# Patient Record
Sex: Male | Born: 1935 | Race: White | Hispanic: No | Marital: Married | Smoking: Never smoker
Health system: Southern US, Community
[De-identification: ages and names within clinical notes are randomized; demographics above are authoritative.]

## PROBLEM LIST (undated history)

## (undated) DIAGNOSIS — E119 Type 2 diabetes mellitus without complications: Secondary | ICD-10-CM

## (undated) DIAGNOSIS — I82409 Acute embolism and thrombosis of unspecified deep veins of unspecified lower extremity: Principal | ICD-10-CM

## (undated) DIAGNOSIS — E538 Deficiency of other specified B group vitamins: Secondary | ICD-10-CM

## (undated) DIAGNOSIS — C189 Malignant neoplasm of colon, unspecified: Secondary | ICD-10-CM

## (undated) DIAGNOSIS — E039 Hypothyroidism, unspecified: Secondary | ICD-10-CM

## (undated) DIAGNOSIS — C320 Malignant neoplasm of glottis: Secondary | ICD-10-CM

## (undated) DIAGNOSIS — G4733 Obstructive sleep apnea (adult) (pediatric): Secondary | ICD-10-CM

## (undated) DIAGNOSIS — N400 Enlarged prostate without lower urinary tract symptoms: Secondary | ICD-10-CM

## (undated) DIAGNOSIS — E041 Nontoxic single thyroid nodule: Secondary | ICD-10-CM

## (undated) DIAGNOSIS — M546 Pain in thoracic spine: Secondary | ICD-10-CM

## (undated) DIAGNOSIS — R609 Edema, unspecified: Principal | ICD-10-CM

## (undated) DIAGNOSIS — F068 Other specified mental disorders due to known physiological condition: Secondary | ICD-10-CM

## (undated) DIAGNOSIS — A0472 Enterocolitis due to Clostridium difficile, not specified as recurrent: Secondary | ICD-10-CM

## (undated) DIAGNOSIS — K219 Gastro-esophageal reflux disease without esophagitis: Secondary | ICD-10-CM

## (undated) DIAGNOSIS — I5030 Unspecified diastolic (congestive) heart failure: Secondary | ICD-10-CM

## (undated) DIAGNOSIS — N259 Disorder resulting from impaired renal tubular function, unspecified: Secondary | ICD-10-CM

## (undated) DIAGNOSIS — I498 Other specified cardiac arrhythmias: Secondary | ICD-10-CM

## (undated) DIAGNOSIS — K5909 Other constipation: Secondary | ICD-10-CM

## (undated) DIAGNOSIS — M129 Arthropathy, unspecified: Secondary | ICD-10-CM

## (undated) DIAGNOSIS — R972 Elevated prostate specific antigen [PSA]: Secondary | ICD-10-CM

## (undated) DIAGNOSIS — M810 Age-related osteoporosis without current pathological fracture: Secondary | ICD-10-CM

## (undated) DIAGNOSIS — Z86010 Personal history of colonic polyps: Secondary | ICD-10-CM

## (undated) DIAGNOSIS — E785 Hyperlipidemia, unspecified: Secondary | ICD-10-CM

## (undated) DIAGNOSIS — I10 Essential (primary) hypertension: Secondary | ICD-10-CM

## (undated) DIAGNOSIS — Z85038 Personal history of other malignant neoplasm of large intestine: Secondary | ICD-10-CM

## (undated) DIAGNOSIS — F329 Major depressive disorder, single episode, unspecified: Secondary | ICD-10-CM

## (undated) DIAGNOSIS — Z87442 Personal history of urinary calculi: Secondary | ICD-10-CM

## (undated) DIAGNOSIS — M75100 Unspecified rotator cuff tear or rupture of unspecified shoulder, not specified as traumatic: Secondary | ICD-10-CM

## (undated) DIAGNOSIS — D649 Anemia, unspecified: Secondary | ICD-10-CM

## (undated) HISTORY — DX: Benign prostatic hyperplasia without lower urinary tract symptoms: N40.0

## (undated) HISTORY — DX: Age-related osteoporosis without current pathological fracture: M81.0

## (undated) HISTORY — DX: Edema, unspecified: R60.9

## (undated) HISTORY — DX: Anemia, unspecified: D64.9

## (undated) HISTORY — DX: Other constipation: K59.09

## (undated) HISTORY — DX: Elevated prostate specific antigen (PSA): R97.20

## (undated) HISTORY — DX: Malignant neoplasm of glottis: C32.0

## (undated) HISTORY — DX: Deficiency of other specified B group vitamins: E53.8

## (undated) HISTORY — DX: Disorder resulting from impaired renal tubular function, unspecified: N25.9

## (undated) HISTORY — DX: Hypothyroidism, unspecified: E03.9

## (undated) HISTORY — DX: Personal history of colonic polyps: Z86.010

## (undated) HISTORY — DX: Personal history of urinary calculi: Z87.442

## (undated) HISTORY — PX: CATARACT EXTRACTION, BILATERAL: SHX1313

## (undated) HISTORY — DX: Arthropathy, unspecified: M12.9

## (undated) HISTORY — DX: Hyperlipidemia, unspecified: E78.5

## (undated) HISTORY — DX: Major depressive disorder, single episode, unspecified: F32.9

## (undated) HISTORY — DX: Gastro-esophageal reflux disease without esophagitis: K21.9

## (undated) HISTORY — DX: Pain in thoracic spine: M54.6

## (undated) HISTORY — DX: Essential (primary) hypertension: I10

## (undated) HISTORY — DX: Other specified mental disorders due to known physiological condition: F06.8

## (undated) HISTORY — DX: Unspecified rotator cuff tear or rupture of unspecified shoulder, not specified as traumatic: M75.100

## (undated) HISTORY — DX: Acute embolism and thrombosis of unspecified deep veins of unspecified lower extremity: I82.409

## (undated) HISTORY — DX: Type 2 diabetes mellitus without complications: E11.9

## (undated) HISTORY — DX: Other specified cardiac arrhythmias: I49.8

## (undated) HISTORY — DX: Nontoxic single thyroid nodule: E04.1

## (undated) HISTORY — DX: Obstructive sleep apnea (adult) (pediatric): G47.33

## (undated) HISTORY — DX: Personal history of other malignant neoplasm of large intestine: Z85.038

## (undated) HISTORY — DX: Malignant neoplasm of colon, unspecified: C18.9

## (undated) HISTORY — DX: Enterocolitis due to Clostridium difficile, not specified as recurrent: A04.72

## (undated) HISTORY — PX: OTHER SURGICAL HISTORY: SHX169

---

## 1982-03-06 HISTORY — PX: OTHER SURGICAL HISTORY: SHX169

## 1996-03-06 HISTORY — PX: COLON RESECTION: SHX5231

## 1996-03-06 HISTORY — PX: APPENDECTOMY: SHX54

## 1997-03-06 HISTORY — PX: CHOLECYSTECTOMY: SHX55

## 2004-09-07 ENCOUNTER — Ambulatory Visit: Payer: Self-pay | Admitting: Internal Medicine

## 2004-10-18 ENCOUNTER — Ambulatory Visit: Payer: Self-pay | Admitting: Internal Medicine

## 2004-11-29 ENCOUNTER — Ambulatory Visit: Payer: Self-pay | Admitting: Internal Medicine

## 2004-12-06 ENCOUNTER — Ambulatory Visit: Payer: Self-pay | Admitting: Internal Medicine

## 2004-12-15 ENCOUNTER — Ambulatory Visit: Payer: Self-pay | Admitting: Internal Medicine

## 2004-12-15 ENCOUNTER — Ambulatory Visit (HOSPITAL_COMMUNITY): Admission: RE | Admit: 2004-12-15 | Discharge: 2004-12-15 | Payer: Self-pay | Admitting: Internal Medicine

## 2005-02-07 ENCOUNTER — Emergency Department (HOSPITAL_COMMUNITY): Admission: EM | Admit: 2005-02-07 | Discharge: 2005-02-07 | Payer: Self-pay | Admitting: Emergency Medicine

## 2005-02-20 ENCOUNTER — Ambulatory Visit: Payer: Self-pay | Admitting: Internal Medicine

## 2005-03-27 ENCOUNTER — Ambulatory Visit: Payer: Self-pay | Admitting: Internal Medicine

## 2005-05-31 ENCOUNTER — Ambulatory Visit: Payer: Self-pay | Admitting: Internal Medicine

## 2005-06-01 ENCOUNTER — Ambulatory Visit (HOSPITAL_COMMUNITY): Admission: RE | Admit: 2005-06-01 | Discharge: 2005-06-01 | Payer: Self-pay | Admitting: Internal Medicine

## 2005-06-19 ENCOUNTER — Ambulatory Visit: Payer: Self-pay | Admitting: Endocrinology

## 2005-06-26 ENCOUNTER — Ambulatory Visit: Payer: Self-pay | Admitting: Internal Medicine

## 2005-08-02 ENCOUNTER — Ambulatory Visit: Payer: Self-pay | Admitting: Internal Medicine

## 2005-08-21 ENCOUNTER — Encounter: Payer: Self-pay | Admitting: Cardiology

## 2005-08-21 ENCOUNTER — Ambulatory Visit: Payer: Self-pay

## 2005-08-30 ENCOUNTER — Ambulatory Visit: Payer: Self-pay | Admitting: Internal Medicine

## 2005-09-12 ENCOUNTER — Ambulatory Visit: Payer: Self-pay

## 2005-09-18 ENCOUNTER — Ambulatory Visit: Payer: Self-pay | Admitting: Internal Medicine

## 2005-09-22 ENCOUNTER — Ambulatory Visit: Payer: Self-pay

## 2005-09-22 ENCOUNTER — Ambulatory Visit: Payer: Self-pay | Admitting: Internal Medicine

## 2005-10-23 ENCOUNTER — Ambulatory Visit: Payer: Self-pay | Admitting: Internal Medicine

## 2005-10-24 ENCOUNTER — Ambulatory Visit: Payer: Self-pay | Admitting: Internal Medicine

## 2005-11-02 ENCOUNTER — Ambulatory Visit: Payer: Self-pay | Admitting: Internal Medicine

## 2005-12-05 ENCOUNTER — Ambulatory Visit: Payer: Self-pay | Admitting: Internal Medicine

## 2005-12-22 ENCOUNTER — Ambulatory Visit: Payer: Self-pay | Admitting: Internal Medicine

## 2006-01-11 ENCOUNTER — Ambulatory Visit (HOSPITAL_BASED_OUTPATIENT_CLINIC_OR_DEPARTMENT_OTHER): Admission: RE | Admit: 2006-01-11 | Discharge: 2006-01-11 | Payer: Self-pay | Admitting: Internal Medicine

## 2006-01-11 ENCOUNTER — Encounter: Payer: Self-pay | Admitting: Pulmonary Disease

## 2006-01-27 ENCOUNTER — Ambulatory Visit: Payer: Self-pay | Admitting: Pulmonary Disease

## 2006-03-19 ENCOUNTER — Ambulatory Visit: Payer: Self-pay | Admitting: Pulmonary Disease

## 2006-03-21 ENCOUNTER — Ambulatory Visit: Payer: Self-pay | Admitting: Internal Medicine

## 2006-03-21 ENCOUNTER — Ambulatory Visit (HOSPITAL_COMMUNITY): Admission: RE | Admit: 2006-03-21 | Discharge: 2006-03-21 | Payer: Self-pay | Admitting: Internal Medicine

## 2006-03-21 LAB — CONVERTED CEMR LAB
CO2: 26 meq/L (ref 19–32)
Calcium: 9.4 mg/dL (ref 8.4–10.5)
Chloride: 107 meq/L (ref 96–112)
Cholesterol: 139 mg/dL (ref 0–200)
GFR calc non Af Amer: 48 mL/min
Glucose, Bld: 140 mg/dL — ABNORMAL HIGH (ref 70–99)
PSA: 5.48 ng/mL
PSA: 5.48 ng/mL — ABNORMAL HIGH (ref 0.10–4.00)
TSH: 2.9 microintl units/mL (ref 0.35–5.50)

## 2006-04-06 ENCOUNTER — Ambulatory Visit: Payer: Self-pay | Admitting: Gastroenterology

## 2006-04-23 ENCOUNTER — Ambulatory Visit: Payer: Self-pay | Admitting: Pulmonary Disease

## 2006-04-26 ENCOUNTER — Encounter (INDEPENDENT_AMBULATORY_CARE_PROVIDER_SITE_OTHER): Payer: Self-pay | Admitting: *Deleted

## 2006-04-26 ENCOUNTER — Ambulatory Visit: Payer: Self-pay | Admitting: Gastroenterology

## 2006-05-03 ENCOUNTER — Ambulatory Visit: Payer: Self-pay | Admitting: Internal Medicine

## 2006-07-20 ENCOUNTER — Ambulatory Visit: Payer: Self-pay | Admitting: Pulmonary Disease

## 2006-09-20 ENCOUNTER — Encounter: Payer: Self-pay | Admitting: Internal Medicine

## 2006-09-20 DIAGNOSIS — E785 Hyperlipidemia, unspecified: Secondary | ICD-10-CM

## 2006-09-20 DIAGNOSIS — Z85038 Personal history of other malignant neoplasm of large intestine: Secondary | ICD-10-CM

## 2006-09-20 DIAGNOSIS — C320 Malignant neoplasm of glottis: Secondary | ICD-10-CM

## 2006-09-20 DIAGNOSIS — E119 Type 2 diabetes mellitus without complications: Secondary | ICD-10-CM

## 2006-09-20 DIAGNOSIS — I10 Essential (primary) hypertension: Secondary | ICD-10-CM

## 2006-09-20 HISTORY — DX: Malignant neoplasm of glottis: C32.0

## 2006-09-20 HISTORY — DX: Hyperlipidemia, unspecified: E78.5

## 2006-09-20 HISTORY — DX: Essential (primary) hypertension: I10

## 2006-09-20 HISTORY — DX: Type 2 diabetes mellitus without complications: E11.9

## 2006-09-20 HISTORY — DX: Personal history of other malignant neoplasm of large intestine: Z85.038

## 2006-09-28 ENCOUNTER — Ambulatory Visit: Payer: Self-pay | Admitting: Pulmonary Disease

## 2006-10-08 DIAGNOSIS — Z860100 Personal history of colon polyps, unspecified: Secondary | ICD-10-CM | POA: Insufficient documentation

## 2006-10-08 DIAGNOSIS — M810 Age-related osteoporosis without current pathological fracture: Secondary | ICD-10-CM | POA: Insufficient documentation

## 2006-10-08 DIAGNOSIS — N400 Enlarged prostate without lower urinary tract symptoms: Secondary | ICD-10-CM

## 2006-10-08 HISTORY — DX: Benign prostatic hyperplasia without lower urinary tract symptoms: N40.0

## 2006-10-08 HISTORY — DX: Personal history of colonic polyps: Z86.010

## 2006-10-08 HISTORY — DX: Age-related osteoporosis without current pathological fracture: M81.0

## 2006-10-08 HISTORY — DX: Personal history of colon polyps, unspecified: Z86.0100

## 2006-11-01 ENCOUNTER — Ambulatory Visit: Payer: Self-pay | Admitting: Internal Medicine

## 2006-11-01 LAB — CONVERTED CEMR LAB
BUN: 23 mg/dL (ref 6–23)
CO2: 29 meq/L (ref 19–32)
Chloride: 108 meq/L (ref 96–112)
Creatinine, Ser: 1.2 mg/dL (ref 0.4–1.5)
HDL: 37.5 mg/dL — ABNORMAL LOW (ref >39.0)
Potassium: 4.5 meq/L (ref 3.5–5.1)
TSH: 1.34 microintl units/mL (ref 0.35–5.50)
Triglycerides: 145 mg/dL (ref 0–149)

## 2006-12-05 ENCOUNTER — Ambulatory Visit: Payer: Self-pay | Admitting: Internal Medicine

## 2007-01-14 ENCOUNTER — Ambulatory Visit: Payer: Self-pay | Admitting: Internal Medicine

## 2007-01-14 DIAGNOSIS — K219 Gastro-esophageal reflux disease without esophagitis: Secondary | ICD-10-CM

## 2007-01-14 DIAGNOSIS — Z87442 Personal history of urinary calculi: Secondary | ICD-10-CM

## 2007-01-14 DIAGNOSIS — G4733 Obstructive sleep apnea (adult) (pediatric): Secondary | ICD-10-CM

## 2007-01-14 DIAGNOSIS — M719 Bursopathy, unspecified: Secondary | ICD-10-CM

## 2007-01-14 DIAGNOSIS — E039 Hypothyroidism, unspecified: Secondary | ICD-10-CM

## 2007-01-14 DIAGNOSIS — R972 Elevated prostate specific antigen [PSA]: Secondary | ICD-10-CM

## 2007-01-14 DIAGNOSIS — M67919 Unspecified disorder of synovium and tendon, unspecified shoulder: Secondary | ICD-10-CM | POA: Insufficient documentation

## 2007-01-14 DIAGNOSIS — E041 Nontoxic single thyroid nodule: Secondary | ICD-10-CM

## 2007-01-14 HISTORY — DX: Hypothyroidism, unspecified: E03.9

## 2007-01-14 HISTORY — DX: Obstructive sleep apnea (adult) (pediatric): G47.33

## 2007-01-14 HISTORY — DX: Personal history of urinary calculi: Z87.442

## 2007-01-14 HISTORY — DX: Nontoxic single thyroid nodule: E04.1

## 2007-01-14 HISTORY — DX: Gastro-esophageal reflux disease without esophagitis: K21.9

## 2007-01-14 LAB — CONVERTED CEMR LAB
BUN: 24 mg/dL — ABNORMAL HIGH (ref 6–23)
CO2: 29 meq/L (ref 19–32)
Creatinine, Ser: 1.3 mg/dL (ref 0.4–1.5)
GFR calc Af Amer: 68 mL/min
Glucose, Bld: 137 mg/dL — ABNORMAL HIGH (ref 70–99)
HDL: 38 mg/dL — ABNORMAL LOW (ref >39.0)
PSA: 4.88 ng/mL — ABNORMAL HIGH (ref 0.10–4.00)
Potassium: 4.1 meq/L (ref 3.5–5.1)
Sodium: 141 meq/L (ref 135–145)
Total CHOL/HDL Ratio: 4.2
Triglycerides: 190 mg/dL — ABNORMAL HIGH (ref 0–149)

## 2007-01-16 ENCOUNTER — Telehealth (INDEPENDENT_AMBULATORY_CARE_PROVIDER_SITE_OTHER): Payer: Self-pay | Admitting: *Deleted

## 2007-01-22 ENCOUNTER — Telehealth (INDEPENDENT_AMBULATORY_CARE_PROVIDER_SITE_OTHER): Payer: Self-pay | Admitting: *Deleted

## 2007-01-23 ENCOUNTER — Ambulatory Visit: Payer: Self-pay | Admitting: Internal Medicine

## 2007-01-23 ENCOUNTER — Inpatient Hospital Stay (HOSPITAL_COMMUNITY): Admission: EM | Admit: 2007-01-23 | Discharge: 2007-01-27 | Payer: Self-pay | Admitting: Internal Medicine

## 2007-01-29 ENCOUNTER — Ambulatory Visit: Payer: Self-pay | Admitting: Gastroenterology

## 2007-01-30 ENCOUNTER — Ambulatory Visit: Payer: Self-pay | Admitting: Internal Medicine

## 2007-01-30 DIAGNOSIS — A0472 Enterocolitis due to Clostridium difficile, not specified as recurrent: Secondary | ICD-10-CM

## 2007-01-30 DIAGNOSIS — R42 Dizziness and giddiness: Secondary | ICD-10-CM

## 2007-01-30 HISTORY — DX: Enterocolitis due to Clostridium difficile, not specified as recurrent: A04.72

## 2007-02-19 ENCOUNTER — Emergency Department (HOSPITAL_COMMUNITY): Admission: EM | Admit: 2007-02-19 | Discharge: 2007-02-19 | Payer: Self-pay | Admitting: Emergency Medicine

## 2007-02-23 DIAGNOSIS — M129 Arthropathy, unspecified: Secondary | ICD-10-CM | POA: Insufficient documentation

## 2007-02-23 HISTORY — DX: Arthropathy, unspecified: M12.9

## 2007-02-26 ENCOUNTER — Encounter: Payer: Self-pay | Admitting: Internal Medicine

## 2007-02-27 ENCOUNTER — Ambulatory Visit: Payer: Self-pay | Admitting: Internal Medicine

## 2007-02-27 DIAGNOSIS — F329 Major depressive disorder, single episode, unspecified: Secondary | ICD-10-CM | POA: Insufficient documentation

## 2007-02-27 DIAGNOSIS — F3289 Other specified depressive episodes: Secondary | ICD-10-CM

## 2007-02-27 HISTORY — DX: Major depressive disorder, single episode, unspecified: F32.9

## 2007-02-27 HISTORY — DX: Other specified depressive episodes: F32.89

## 2007-03-08 ENCOUNTER — Ambulatory Visit: Payer: Self-pay | Admitting: Pulmonary Disease

## 2007-03-25 ENCOUNTER — Encounter: Payer: Self-pay | Admitting: Internal Medicine

## 2007-04-17 ENCOUNTER — Ambulatory Visit: Payer: Self-pay | Admitting: Internal Medicine

## 2007-04-17 ENCOUNTER — Encounter: Payer: Self-pay | Admitting: Internal Medicine

## 2007-04-25 ENCOUNTER — Encounter: Payer: Self-pay | Admitting: Internal Medicine

## 2007-05-14 ENCOUNTER — Ambulatory Visit: Payer: Self-pay | Admitting: Internal Medicine

## 2007-05-14 DIAGNOSIS — F068 Other specified mental disorders due to known physiological condition: Secondary | ICD-10-CM

## 2007-05-14 DIAGNOSIS — F0390 Unspecified dementia without behavioral disturbance: Secondary | ICD-10-CM

## 2007-05-14 DIAGNOSIS — R269 Unspecified abnormalities of gait and mobility: Secondary | ICD-10-CM

## 2007-05-14 HISTORY — DX: Other specified mental disorders due to known physiological condition: F06.8

## 2007-05-15 LAB — CONVERTED CEMR LAB
BUN: 26 mg/dL — ABNORMAL HIGH (ref 6–23)
Basophils Absolute: 0.1 10*3/uL (ref 0.0–0.1)
Basophils Relative: 0.9 % (ref 0.0–1.0)
CO2: 31 meq/L (ref 19–32)
Calcium: 10.2 mg/dL (ref 8.4–10.5)
Cholesterol: 160 mg/dL (ref 0–200)
Creatinine, Ser: 1.2 mg/dL (ref 0.4–1.5)
Direct LDL: 91.1 mg/dL
Eosinophils Absolute: 0.1 10*3/uL (ref 0.0–0.6)
Eosinophils Relative: 1.9 % (ref 0.0–5.0)
Folate: >20 ng/mL
GFR calc Af Amer: 74 mL/min
GFR calc non Af Amer: 61 mL/min
HCT: 36.3 % — ABNORMAL LOW (ref 39.0–52.0)
HDL: 37.4 mg/dL — ABNORMAL LOW (ref >39.0)
Hgb A1c MFr Bld: 7 % — ABNORMAL HIGH (ref 4.6–6.0)
Lymphocytes Relative: 24.9 % (ref 12.0–46.0)
Monocytes Relative: 7.8 % (ref 3.0–11.0)
Neutrophils Relative %: 64.5 % (ref 43.0–77.0)
Platelets: 306 10*3/uL (ref 150–400)
Potassium: 4.5 meq/L (ref 3.5–5.1)
RBC: 3.73 M/uL — ABNORMAL LOW (ref 4.22–5.81)
RDW: 12 % (ref 11.5–14.6)
Sed Rate: 33 mm/hr — ABNORMAL HIGH (ref 0–20)
Total CHOL/HDL Ratio: 4.3
Triglycerides: 274 mg/dL (ref 0–149)
VLDL: 55 mg/dL — ABNORMAL HIGH (ref 0–40)
WBC: 7 10*3/uL (ref 4.5–10.5)

## 2007-05-17 ENCOUNTER — Encounter (INDEPENDENT_AMBULATORY_CARE_PROVIDER_SITE_OTHER): Payer: Self-pay | Admitting: *Deleted

## 2007-05-18 ENCOUNTER — Encounter: Admission: RE | Admit: 2007-05-18 | Discharge: 2007-05-18 | Payer: Self-pay | Admitting: Internal Medicine

## 2007-05-24 ENCOUNTER — Ambulatory Visit: Payer: Self-pay | Admitting: Internal Medicine

## 2007-05-24 DIAGNOSIS — M76899 Other specified enthesopathies of unspecified lower limb, excluding foot: Secondary | ICD-10-CM | POA: Insufficient documentation

## 2007-05-24 DIAGNOSIS — M7020 Olecranon bursitis, unspecified elbow: Secondary | ICD-10-CM

## 2007-05-29 ENCOUNTER — Encounter: Payer: Self-pay | Admitting: Internal Medicine

## 2007-06-04 ENCOUNTER — Encounter: Payer: Self-pay | Admitting: Internal Medicine

## 2007-06-20 ENCOUNTER — Encounter: Payer: Self-pay | Admitting: Internal Medicine

## 2007-07-24 ENCOUNTER — Encounter: Payer: Self-pay | Admitting: Pulmonary Disease

## 2007-07-31 ENCOUNTER — Encounter: Payer: Self-pay | Admitting: Internal Medicine

## 2007-08-31 ENCOUNTER — Emergency Department (HOSPITAL_COMMUNITY): Admission: EM | Admit: 2007-08-31 | Discharge: 2007-08-31 | Payer: Self-pay | Admitting: Emergency Medicine

## 2007-09-03 ENCOUNTER — Ambulatory Visit: Payer: Self-pay | Admitting: Internal Medicine

## 2007-09-03 ENCOUNTER — Telehealth (INDEPENDENT_AMBULATORY_CARE_PROVIDER_SITE_OTHER): Payer: Self-pay | Admitting: *Deleted

## 2007-09-03 DIAGNOSIS — G4710 Hypersomnia, unspecified: Secondary | ICD-10-CM

## 2007-09-03 DIAGNOSIS — R609 Edema, unspecified: Secondary | ICD-10-CM

## 2007-09-03 HISTORY — DX: Edema, unspecified: R60.9

## 2007-09-04 ENCOUNTER — Ambulatory Visit: Payer: Self-pay | Admitting: Pulmonary Disease

## 2007-09-13 ENCOUNTER — Encounter: Payer: Self-pay | Admitting: Pulmonary Disease

## 2007-09-25 ENCOUNTER — Telehealth (INDEPENDENT_AMBULATORY_CARE_PROVIDER_SITE_OTHER): Payer: Self-pay | Admitting: *Deleted

## 2007-09-26 ENCOUNTER — Ambulatory Visit: Payer: Self-pay | Admitting: Internal Medicine

## 2007-09-30 ENCOUNTER — Telehealth (INDEPENDENT_AMBULATORY_CARE_PROVIDER_SITE_OTHER): Payer: Self-pay | Admitting: *Deleted

## 2007-09-30 LAB — CONVERTED CEMR LAB
BUN: 26 mg/dL — ABNORMAL HIGH (ref 6–23)
Bacteria, UA: NEGATIVE
Basophils Relative: 0 % (ref 0.0–3.0)
Bilirubin Urine: NEGATIVE
CO2: 27 meq/L (ref 19–32)
Chloride: 106 meq/L (ref 96–112)
Cholesterol: 157 mg/dL (ref 0–200)
Creatinine, Ser: 1.1 mg/dL (ref 0.4–1.5)
Glucose, Bld: 128 mg/dL — ABNORMAL HIGH (ref 70–99)
HCT: 31.1 % — ABNORMAL LOW (ref 39.0–52.0)
Hemoglobin: 10.7 g/dL — ABNORMAL LOW (ref 13.0–17.0)
Hgb A1c MFr Bld: 7.5 % — ABNORMAL HIGH (ref 4.6–6.0)
Ketones, ur: NEGATIVE mg/dL
LDL Cholesterol: 88 mg/dL (ref 0–99)
Lymphocytes Relative: 25.5 % (ref 12.0–46.0)
Monocytes Absolute: 0.4 10*3/uL (ref 0.1–1.0)
Monocytes Relative: 7.8 % (ref 3.0–12.0)
Mucus, UA: NEGATIVE
Neutro Abs: 3.1 10*3/uL (ref 1.4–7.7)
RBC: 3.12 M/uL — ABNORMAL LOW (ref 4.22–5.81)
RDW: 11.8 % (ref 11.5–14.6)
Total CHOL/HDL Ratio: 4
Total Protein, Urine: 100 mg/dL — AB
Triglycerides: 153 mg/dL — ABNORMAL HIGH (ref 0–149)
Urine Glucose: NEGATIVE mg/dL
pH: 6 (ref 5.0–8.0)

## 2007-10-02 ENCOUNTER — Telehealth (INDEPENDENT_AMBULATORY_CARE_PROVIDER_SITE_OTHER): Payer: Self-pay | Admitting: *Deleted

## 2007-10-03 ENCOUNTER — Ambulatory Visit (HOSPITAL_COMMUNITY): Admission: RE | Admit: 2007-10-03 | Discharge: 2007-10-03 | Payer: Self-pay | Admitting: Neurology

## 2007-10-07 ENCOUNTER — Telehealth: Payer: Self-pay | Admitting: Pulmonary Disease

## 2007-10-16 ENCOUNTER — Ambulatory Visit: Payer: Self-pay | Admitting: Pulmonary Disease

## 2007-11-19 ENCOUNTER — Telehealth (INDEPENDENT_AMBULATORY_CARE_PROVIDER_SITE_OTHER): Payer: Self-pay | Admitting: *Deleted

## 2007-11-29 ENCOUNTER — Ambulatory Visit: Payer: Self-pay | Admitting: Pulmonary Disease

## 2007-12-12 ENCOUNTER — Encounter: Payer: Self-pay | Admitting: Internal Medicine

## 2007-12-18 ENCOUNTER — Ambulatory Visit: Payer: Self-pay | Admitting: Internal Medicine

## 2007-12-31 ENCOUNTER — Ambulatory Visit: Payer: Self-pay | Admitting: Internal Medicine

## 2008-01-21 ENCOUNTER — Ambulatory Visit: Payer: Self-pay | Admitting: Internal Medicine

## 2008-01-21 LAB — CONVERTED CEMR LAB
CO2: 29 meq/L (ref 19–32)
Calcium: 9.5 mg/dL (ref 8.4–10.5)
Creatinine, Ser: 1.2 mg/dL (ref 0.4–1.5)
GFR calc Af Amer: 74 mL/min
HDL: 39.7 mg/dL (ref >39.0)
Hgb A1c MFr Bld: 6.8 % — ABNORMAL HIGH (ref 4.6–6.0)
Total CHOL/HDL Ratio: 3.8
Triglycerides: 107 mg/dL (ref 0–149)
VLDL: 21 mg/dL (ref 0–40)

## 2008-02-14 ENCOUNTER — Ambulatory Visit: Payer: Self-pay | Admitting: Pulmonary Disease

## 2008-02-24 ENCOUNTER — Telehealth (INDEPENDENT_AMBULATORY_CARE_PROVIDER_SITE_OTHER): Payer: Self-pay | Admitting: *Deleted

## 2008-03-10 ENCOUNTER — Telehealth: Payer: Self-pay | Admitting: Pulmonary Disease

## 2008-04-20 ENCOUNTER — Telehealth (INDEPENDENT_AMBULATORY_CARE_PROVIDER_SITE_OTHER): Payer: Self-pay | Admitting: *Deleted

## 2008-05-21 ENCOUNTER — Encounter: Payer: Self-pay | Admitting: Internal Medicine

## 2008-06-04 ENCOUNTER — Ambulatory Visit: Payer: Self-pay | Admitting: Internal Medicine

## 2008-06-04 DIAGNOSIS — M546 Pain in thoracic spine: Secondary | ICD-10-CM

## 2008-06-04 DIAGNOSIS — R209 Unspecified disturbances of skin sensation: Secondary | ICD-10-CM

## 2008-06-04 DIAGNOSIS — E538 Deficiency of other specified B group vitamins: Secondary | ICD-10-CM

## 2008-06-04 HISTORY — DX: Pain in thoracic spine: M54.6

## 2008-06-08 LAB — CONVERTED CEMR LAB
Basophils Absolute: 0 10*3/uL (ref 0.0–0.1)
Bilirubin, Direct: 0.1 mg/dL (ref 0.0–0.3)
Calcium: 9.4 mg/dL (ref 8.4–10.5)
Creatinine, Ser: 1.2 mg/dL (ref 0.4–1.5)
Eosinophils Absolute: 0.1 10*3/uL (ref 0.0–0.7)
GFR calc non Af Amer: 61.05 mL/min (ref >60)
HCT: 30.4 % — ABNORMAL LOW (ref 39.0–52.0)
Hemoglobin: 10.4 g/dL — ABNORMAL LOW (ref 13.0–17.0)
Lymphs Abs: 1.6 10*3/uL (ref 0.7–4.0)
MCHC: 34.2 g/dL (ref 30.0–36.0)
Neutro Abs: 2.8 10*3/uL (ref 1.4–7.7)
RDW: 12 % (ref 11.5–14.6)
Total Bilirubin: 0.7 mg/dL (ref 0.3–1.2)

## 2008-06-12 ENCOUNTER — Telehealth (INDEPENDENT_AMBULATORY_CARE_PROVIDER_SITE_OTHER): Payer: Self-pay | Admitting: *Deleted

## 2008-06-12 ENCOUNTER — Ambulatory Visit: Payer: Self-pay | Admitting: Internal Medicine

## 2008-06-12 ENCOUNTER — Inpatient Hospital Stay (HOSPITAL_COMMUNITY): Admission: AD | Admit: 2008-06-12 | Discharge: 2008-06-14 | Payer: Self-pay | Admitting: Internal Medicine

## 2008-06-13 ENCOUNTER — Encounter: Payer: Self-pay | Admitting: Internal Medicine

## 2008-06-23 ENCOUNTER — Ambulatory Visit: Payer: Self-pay | Admitting: Internal Medicine

## 2008-06-23 ENCOUNTER — Telehealth: Payer: Self-pay | Admitting: Gastroenterology

## 2008-06-23 DIAGNOSIS — D649 Anemia, unspecified: Secondary | ICD-10-CM

## 2008-06-23 DIAGNOSIS — R634 Abnormal weight loss: Secondary | ICD-10-CM | POA: Insufficient documentation

## 2008-06-23 DIAGNOSIS — R112 Nausea with vomiting, unspecified: Secondary | ICD-10-CM

## 2008-06-23 HISTORY — DX: Anemia, unspecified: D64.9

## 2008-06-23 LAB — CONVERTED CEMR LAB
AST: 16 units/L (ref 0–37)
Basophils Absolute: 0 10*3/uL (ref 0.0–0.1)
Basophils Relative: 0.6 % (ref 0.0–3.0)
CO2: 29 meq/L (ref 19–32)
Calcium: 9.7 mg/dL (ref 8.4–10.5)
Chloride: 107 meq/L (ref 96–112)
Glucose, Bld: 102 mg/dL — ABNORMAL HIGH (ref 70–99)
H Pylori IgG: NEGATIVE
HDL: 48.3 mg/dL (ref >39.00)
Hemoglobin: 11.2 g/dL — ABNORMAL LOW (ref 13.0–17.0)
LDL Cholesterol: 65 mg/dL (ref 0–99)
Lipase: 75 units/L — ABNORMAL HIGH (ref 11.0–59.0)
Lymphocytes Relative: 24.3 % (ref 12.0–46.0)
Monocytes Relative: 7.7 % (ref 3.0–12.0)
Neutro Abs: 4.1 10*3/uL (ref 1.4–7.7)
RBC: 3.32 M/uL — ABNORMAL LOW (ref 4.22–5.81)
RDW: 12.1 % (ref 11.5–14.6)
Saturation Ratios: 23.7 % (ref 20.0–50.0)
Sodium: 143 meq/L (ref 135–145)
Total Bilirubin: 0.7 mg/dL (ref 0.3–1.2)
Total CHOL/HDL Ratio: 3
Triglycerides: 143 mg/dL (ref 0.0–149.0)
VLDL: 28.6 mg/dL (ref 0.0–40.0)

## 2008-06-25 ENCOUNTER — Encounter: Payer: Self-pay | Admitting: Nurse Practitioner

## 2008-06-25 ENCOUNTER — Ambulatory Visit: Payer: Self-pay | Admitting: Gastroenterology

## 2008-06-26 ENCOUNTER — Encounter: Payer: Self-pay | Admitting: Nurse Practitioner

## 2008-07-03 ENCOUNTER — Ambulatory Visit: Payer: Self-pay | Admitting: Internal Medicine

## 2008-07-13 ENCOUNTER — Ambulatory Visit: Payer: Self-pay | Admitting: Gastroenterology

## 2008-07-14 ENCOUNTER — Encounter: Payer: Self-pay | Admitting: Gastroenterology

## 2008-07-14 ENCOUNTER — Ambulatory Visit: Payer: Self-pay | Admitting: Gastroenterology

## 2008-07-14 LAB — HM COLONOSCOPY

## 2008-07-15 ENCOUNTER — Telehealth: Payer: Self-pay | Admitting: Gastroenterology

## 2008-07-17 ENCOUNTER — Encounter: Payer: Self-pay | Admitting: Gastroenterology

## 2008-07-22 ENCOUNTER — Ambulatory Visit (HOSPITAL_COMMUNITY): Admission: RE | Admit: 2008-07-22 | Discharge: 2008-07-22 | Payer: Self-pay | Admitting: Gastroenterology

## 2008-07-23 ENCOUNTER — Ambulatory Visit: Payer: Self-pay | Admitting: Internal Medicine

## 2008-07-27 ENCOUNTER — Telehealth (INDEPENDENT_AMBULATORY_CARE_PROVIDER_SITE_OTHER): Payer: Self-pay | Admitting: *Deleted

## 2008-08-04 ENCOUNTER — Ambulatory Visit: Payer: Self-pay | Admitting: Gastroenterology

## 2008-08-07 ENCOUNTER — Telehealth: Payer: Self-pay | Admitting: Gastroenterology

## 2008-08-12 ENCOUNTER — Telehealth: Payer: Self-pay | Admitting: Gastroenterology

## 2008-08-12 ENCOUNTER — Ambulatory Visit: Payer: Self-pay | Admitting: Pulmonary Disease

## 2008-08-21 ENCOUNTER — Telehealth: Payer: Self-pay | Admitting: Pulmonary Disease

## 2008-08-24 ENCOUNTER — Ambulatory Visit: Payer: Self-pay | Admitting: Internal Medicine

## 2008-08-30 ENCOUNTER — Emergency Department (HOSPITAL_COMMUNITY): Admission: EM | Admit: 2008-08-30 | Discharge: 2008-08-30 | Payer: Self-pay | Admitting: Family Medicine

## 2008-08-31 ENCOUNTER — Ambulatory Visit: Payer: Self-pay | Admitting: Internal Medicine

## 2008-08-31 ENCOUNTER — Inpatient Hospital Stay (HOSPITAL_COMMUNITY): Admission: EM | Admit: 2008-08-31 | Discharge: 2008-09-01 | Payer: Self-pay | Admitting: Emergency Medicine

## 2008-09-02 ENCOUNTER — Ambulatory Visit: Payer: Self-pay | Admitting: Pulmonary Disease

## 2008-09-03 ENCOUNTER — Ambulatory Visit: Payer: Self-pay | Admitting: Internal Medicine

## 2008-09-03 DIAGNOSIS — E876 Hypokalemia: Secondary | ICD-10-CM

## 2008-09-04 ENCOUNTER — Telehealth (INDEPENDENT_AMBULATORY_CARE_PROVIDER_SITE_OTHER): Payer: Self-pay | Admitting: *Deleted

## 2008-09-04 LAB — CONVERTED CEMR LAB
BUN: 25 mg/dL — ABNORMAL HIGH (ref 6–23)
Calcium: 8.5 mg/dL (ref 8.4–10.5)
Creatinine, Ser: 1.4 mg/dL (ref 0.4–1.5)
GFR calc non Af Amer: 51.07 mL/min (ref >60)
Glucose, Bld: 222 mg/dL — ABNORMAL HIGH (ref 70–99)
Sodium: 140 meq/L (ref 135–145)

## 2008-09-08 ENCOUNTER — Telehealth (INDEPENDENT_AMBULATORY_CARE_PROVIDER_SITE_OTHER): Payer: Self-pay | Admitting: *Deleted

## 2008-09-14 ENCOUNTER — Ambulatory Visit: Payer: Self-pay | Admitting: Gastroenterology

## 2008-09-14 LAB — CONVERTED CEMR LAB
ALT: 11 units/L (ref 0–53)
AST: 18 units/L (ref 0–37)
Basophils Absolute: 0 10*3/uL (ref 0.0–0.1)
CO2: 29 meq/L (ref 19–32)
Calcium: 9.3 mg/dL (ref 8.4–10.5)
Chloride: 104 meq/L (ref 96–112)
Eosinophils Absolute: 0.1 10*3/uL (ref 0.0–0.7)
GFR calc non Af Amer: 51.07 mL/min (ref >60)
Lymphocytes Relative: 21.3 % (ref 12.0–46.0)
MCHC: 34.9 g/dL (ref 30.0–36.0)
Monocytes Relative: 6.2 % (ref 3.0–12.0)
Platelets: 271 10*3/uL (ref 150.0–400.0)
RDW: 12.3 % (ref 11.5–14.6)
Sodium: 142 meq/L (ref 135–145)
Total Bilirubin: 0.6 mg/dL (ref 0.3–1.2)
Total Protein: 6.9 g/dL (ref 6.0–8.3)

## 2008-09-21 ENCOUNTER — Ambulatory Visit: Payer: Self-pay | Admitting: Internal Medicine

## 2008-09-29 ENCOUNTER — Ambulatory Visit: Payer: Self-pay | Admitting: Internal Medicine

## 2008-10-08 ENCOUNTER — Ambulatory Visit: Payer: Self-pay | Admitting: Pulmonary Disease

## 2008-10-13 ENCOUNTER — Ambulatory Visit: Payer: Self-pay | Admitting: Internal Medicine

## 2008-10-13 DIAGNOSIS — H9319 Tinnitus, unspecified ear: Secondary | ICD-10-CM | POA: Insufficient documentation

## 2008-10-13 DIAGNOSIS — B370 Candidal stomatitis: Secondary | ICD-10-CM

## 2008-10-22 ENCOUNTER — Ambulatory Visit: Payer: Self-pay | Admitting: Internal Medicine

## 2008-10-23 ENCOUNTER — Encounter: Admission: RE | Admit: 2008-10-23 | Discharge: 2008-10-23 | Payer: Self-pay | Admitting: Internal Medicine

## 2008-11-19 ENCOUNTER — Ambulatory Visit: Payer: Self-pay | Admitting: Internal Medicine

## 2008-11-30 ENCOUNTER — Encounter: Payer: Self-pay | Admitting: Internal Medicine

## 2008-12-02 ENCOUNTER — Encounter: Admission: RE | Admit: 2008-12-02 | Discharge: 2008-12-02 | Payer: Self-pay | Admitting: Orthopedic Surgery

## 2008-12-11 ENCOUNTER — Ambulatory Visit: Payer: Self-pay | Admitting: Internal Medicine

## 2008-12-21 ENCOUNTER — Ambulatory Visit: Payer: Self-pay | Admitting: Internal Medicine

## 2009-01-12 ENCOUNTER — Ambulatory Visit: Payer: Self-pay | Admitting: Internal Medicine

## 2009-01-20 ENCOUNTER — Telehealth: Payer: Self-pay | Admitting: Internal Medicine

## 2009-01-25 ENCOUNTER — Encounter: Payer: Self-pay | Admitting: Internal Medicine

## 2009-01-25 ENCOUNTER — Telehealth: Payer: Self-pay | Admitting: Family Medicine

## 2009-01-25 ENCOUNTER — Ambulatory Visit: Payer: Self-pay | Admitting: Internal Medicine

## 2009-01-25 DIAGNOSIS — N390 Urinary tract infection, site not specified: Secondary | ICD-10-CM | POA: Insufficient documentation

## 2009-01-25 DIAGNOSIS — R509 Fever, unspecified: Secondary | ICD-10-CM

## 2009-01-25 LAB — CONVERTED CEMR LAB
BUN: 25 mg/dL — ABNORMAL HIGH (ref 6–23)
Basophils Absolute: 0 10*3/uL (ref 0.0–0.1)
Bilirubin Urine: NEGATIVE
CO2: 26 meq/L (ref 19–32)
Eosinophils Absolute: 0 10*3/uL (ref 0.0–0.7)
GFR calc non Af Amer: 43.74 mL/min (ref >60)
Glucose, Bld: 172 mg/dL — ABNORMAL HIGH (ref 70–99)
HCT: 31.2 % — ABNORMAL LOW (ref 39.0–52.0)
Hemoglobin: 11 g/dL — ABNORMAL LOW (ref 13.0–17.0)
Ketones, ur: NEGATIVE mg/dL
Lymphs Abs: 0.7 10*3/uL (ref 0.7–4.0)
MCHC: 35.2 g/dL (ref 30.0–36.0)
Monocytes Absolute: 0.6 10*3/uL (ref 0.1–1.0)
Monocytes Relative: 5.6 % (ref 3.0–12.0)
Neutro Abs: 10.2 10*3/uL — ABNORMAL HIGH (ref 1.4–7.7)
Nitrite: POSITIVE
Platelets: 382 10*3/uL (ref 150.0–400.0)
Potassium: 3.8 meq/L (ref 3.5–5.1)
RDW: 11.7 % (ref 11.5–14.6)
Urobilinogen, UA: 0.2 (ref 0.0–1.0)
pH: 6 (ref 5.0–8.0)

## 2009-01-26 ENCOUNTER — Emergency Department (HOSPITAL_COMMUNITY): Admission: EM | Admit: 2009-01-26 | Discharge: 2009-01-27 | Payer: Self-pay | Admitting: Emergency Medicine

## 2009-01-26 ENCOUNTER — Telehealth: Payer: Self-pay | Admitting: Internal Medicine

## 2009-01-26 ENCOUNTER — Telehealth: Payer: Self-pay | Admitting: Family Medicine

## 2009-01-27 ENCOUNTER — Ambulatory Visit: Payer: Self-pay | Admitting: Internal Medicine

## 2009-02-01 ENCOUNTER — Encounter: Payer: Self-pay | Admitting: Internal Medicine

## 2009-02-04 ENCOUNTER — Ambulatory Visit: Payer: Self-pay | Admitting: Internal Medicine

## 2009-03-11 ENCOUNTER — Inpatient Hospital Stay (HOSPITAL_COMMUNITY): Admission: EM | Admit: 2009-03-11 | Discharge: 2009-03-16 | Payer: Self-pay | Admitting: Emergency Medicine

## 2009-03-15 ENCOUNTER — Telehealth: Payer: Self-pay | Admitting: Internal Medicine

## 2009-03-29 ENCOUNTER — Telehealth: Payer: Self-pay | Admitting: Internal Medicine

## 2009-04-01 ENCOUNTER — Ambulatory Visit: Payer: Self-pay | Admitting: Internal Medicine

## 2009-04-01 DIAGNOSIS — J189 Pneumonia, unspecified organism: Secondary | ICD-10-CM

## 2009-04-01 DIAGNOSIS — M25559 Pain in unspecified hip: Secondary | ICD-10-CM

## 2009-04-09 ENCOUNTER — Telehealth: Payer: Self-pay | Admitting: Internal Medicine

## 2009-04-12 ENCOUNTER — Emergency Department (HOSPITAL_COMMUNITY)
Admission: EM | Admit: 2009-04-12 | Discharge: 2009-04-13 | Payer: Self-pay | Source: Home / Self Care | Admitting: Emergency Medicine

## 2009-04-13 ENCOUNTER — Ambulatory Visit: Payer: Self-pay | Admitting: Internal Medicine

## 2009-04-14 ENCOUNTER — Encounter: Payer: Self-pay | Admitting: Internal Medicine

## 2009-05-06 ENCOUNTER — Ambulatory Visit: Payer: Self-pay | Admitting: Gastroenterology

## 2009-05-10 ENCOUNTER — Encounter: Payer: Self-pay | Admitting: Internal Medicine

## 2009-05-10 ENCOUNTER — Telehealth: Payer: Self-pay | Admitting: Internal Medicine

## 2009-05-11 ENCOUNTER — Ambulatory Visit: Payer: Self-pay | Admitting: Internal Medicine

## 2009-05-12 ENCOUNTER — Ambulatory Visit: Payer: Self-pay | Admitting: Gastroenterology

## 2009-05-13 ENCOUNTER — Telehealth: Payer: Self-pay | Admitting: Gastroenterology

## 2009-05-14 ENCOUNTER — Ambulatory Visit (HOSPITAL_COMMUNITY)
Admission: RE | Admit: 2009-05-14 | Discharge: 2009-05-14 | Payer: Self-pay | Source: Home / Self Care | Admitting: Gastroenterology

## 2009-05-17 ENCOUNTER — Ambulatory Visit: Payer: Self-pay | Admitting: Gastroenterology

## 2009-05-18 ENCOUNTER — Ambulatory Visit: Payer: Self-pay | Admitting: Internal Medicine

## 2009-05-18 DIAGNOSIS — R5383 Other fatigue: Secondary | ICD-10-CM

## 2009-05-18 DIAGNOSIS — R5381 Other malaise: Secondary | ICD-10-CM

## 2009-05-18 LAB — CONVERTED CEMR LAB
ALT: 24 units/L (ref 0–53)
AST: 28 units/L (ref 0–37)
Albumin: 3.8 g/dL (ref 3.5–5.2)
BUN: 24 mg/dL — ABNORMAL HIGH (ref 6–23)
Basophils Absolute: 0 10*3/uL (ref 0.0–0.1)
Chloride: 102 meq/L (ref 96–112)
Eosinophils Relative: 2.5 % (ref 0.0–5.0)
GFR calc non Af Amer: 47.08 mL/min (ref >60)
Glucose, Bld: 101 mg/dL — ABNORMAL HIGH (ref 70–99)
HCT: 29.7 % — ABNORMAL LOW (ref 39.0–52.0)
Hemoglobin: 9.8 g/dL — ABNORMAL LOW (ref 13.0–17.0)
Hgb A1c MFr Bld: 6 % (ref 4.6–6.5)
Iron: 68 ug/dL (ref 42–165)
Lymphs Abs: 1.5 10*3/uL (ref 0.7–4.0)
MCV: 98.2 fL (ref 78.0–100.0)
Microalb Creat Ratio: 367.6 mg/g — ABNORMAL HIGH (ref 0.0–30.0)
Microalb, Ur: 29.7 mg/dL — ABNORMAL HIGH (ref 0.0–1.9)
Monocytes Absolute: 0.4 10*3/uL (ref 0.1–1.0)
Monocytes Relative: 5.9 % (ref 3.0–12.0)
Neutro Abs: 4.1 10*3/uL (ref 1.4–7.7)
Platelets: 288 10*3/uL (ref 150.0–400.0)
Potassium: 4.3 meq/L (ref 3.5–5.1)
RDW: 13.9 % (ref 11.5–14.6)
Sed Rate: 31 mm/hr — ABNORMAL HIGH (ref 0–22)
Sodium: 139 meq/L (ref 135–145)
TSH: 2.2 microintl units/mL (ref 0.35–5.50)
Total CHOL/HDL Ratio: 2

## 2009-05-28 ENCOUNTER — Encounter: Payer: Self-pay | Admitting: Internal Medicine

## 2009-06-07 ENCOUNTER — Emergency Department (HOSPITAL_COMMUNITY): Admission: EM | Admit: 2009-06-07 | Discharge: 2009-06-07 | Payer: Self-pay | Admitting: Emergency Medicine

## 2009-06-08 ENCOUNTER — Ambulatory Visit: Payer: Self-pay | Admitting: Internal Medicine

## 2009-06-10 ENCOUNTER — Ambulatory Visit: Payer: Self-pay | Admitting: Internal Medicine

## 2009-06-10 ENCOUNTER — Telehealth: Payer: Self-pay | Admitting: Internal Medicine

## 2009-06-10 DIAGNOSIS — I498 Other specified cardiac arrhythmias: Secondary | ICD-10-CM

## 2009-06-10 DIAGNOSIS — R109 Unspecified abdominal pain: Secondary | ICD-10-CM | POA: Insufficient documentation

## 2009-06-10 HISTORY — DX: Other specified cardiac arrhythmias: I49.8

## 2009-06-11 ENCOUNTER — Telehealth: Payer: Self-pay | Admitting: Internal Medicine

## 2009-06-11 ENCOUNTER — Encounter: Payer: Self-pay | Admitting: Internal Medicine

## 2009-06-11 LAB — CONVERTED CEMR LAB
Albumin: 3.6 g/dL (ref 3.5–5.2)
Basophils Absolute: 0 10*3/uL (ref 0.0–0.1)
CO2: 28 meq/L (ref 19–32)
Calcium: 9.1 mg/dL (ref 8.4–10.5)
Eosinophils Absolute: 0 10*3/uL (ref 0.0–0.7)
Glucose, Bld: 108 mg/dL — ABNORMAL HIGH (ref 70–99)
HCT: 27.6 % — ABNORMAL LOW (ref 39.0–52.0)
Hemoglobin: 9.5 g/dL — ABNORMAL LOW (ref 13.0–17.0)
Leukocytes, UA: NEGATIVE
Lipase: 22 units/L (ref 11.0–59.0)
Lymphs Abs: 1 10*3/uL (ref 0.7–4.0)
MCHC: 34.3 g/dL (ref 30.0–36.0)
Neutro Abs: 5.7 10*3/uL (ref 1.4–7.7)
Potassium: 3.9 meq/L (ref 3.5–5.1)
RDW: 14 % (ref 11.5–14.6)
Sodium: 142 meq/L (ref 135–145)
Specific Gravity, Urine: 1.025 (ref 1.000–1.030)
Urobilinogen, UA: 0.2 (ref 0.0–1.0)

## 2009-06-15 ENCOUNTER — Ambulatory Visit: Payer: Self-pay | Admitting: Internal Medicine

## 2009-06-21 ENCOUNTER — Ambulatory Visit: Payer: Self-pay | Admitting: Internal Medicine

## 2009-06-28 ENCOUNTER — Ambulatory Visit: Payer: Self-pay | Admitting: Internal Medicine

## 2009-06-28 ENCOUNTER — Telehealth: Payer: Self-pay | Admitting: Internal Medicine

## 2009-06-28 DIAGNOSIS — R0609 Other forms of dyspnea: Secondary | ICD-10-CM | POA: Insufficient documentation

## 2009-06-28 DIAGNOSIS — R0989 Other specified symptoms and signs involving the circulatory and respiratory systems: Secondary | ICD-10-CM

## 2009-06-30 ENCOUNTER — Encounter: Payer: Self-pay | Admitting: Internal Medicine

## 2009-07-09 ENCOUNTER — Ambulatory Visit: Payer: Self-pay | Admitting: Internal Medicine

## 2009-07-13 ENCOUNTER — Ambulatory Visit: Payer: Self-pay | Admitting: Internal Medicine

## 2009-07-13 DIAGNOSIS — M899 Disorder of bone, unspecified: Secondary | ICD-10-CM | POA: Insufficient documentation

## 2009-07-13 DIAGNOSIS — M949 Disorder of cartilage, unspecified: Secondary | ICD-10-CM

## 2009-07-26 ENCOUNTER — Ambulatory Visit: Payer: Self-pay | Admitting: Internal Medicine

## 2009-07-28 ENCOUNTER — Encounter: Payer: Self-pay | Admitting: Internal Medicine

## 2009-07-29 ENCOUNTER — Telehealth (INDEPENDENT_AMBULATORY_CARE_PROVIDER_SITE_OTHER): Payer: Self-pay | Admitting: *Deleted

## 2009-07-30 ENCOUNTER — Telehealth: Payer: Self-pay | Admitting: Internal Medicine

## 2009-07-30 ENCOUNTER — Encounter: Payer: Self-pay | Admitting: Internal Medicine

## 2009-08-03 ENCOUNTER — Ambulatory Visit (HOSPITAL_COMMUNITY): Admission: RE | Admit: 2009-08-03 | Discharge: 2009-08-03 | Payer: Self-pay | Admitting: Internal Medicine

## 2009-08-03 ENCOUNTER — Encounter: Payer: Self-pay | Admitting: Internal Medicine

## 2009-08-03 ENCOUNTER — Ambulatory Visit: Payer: Self-pay

## 2009-08-03 ENCOUNTER — Encounter (HOSPITAL_COMMUNITY): Admission: RE | Admit: 2009-08-03 | Discharge: 2009-10-06 | Payer: Self-pay | Admitting: Internal Medicine

## 2009-08-03 ENCOUNTER — Ambulatory Visit: Payer: Self-pay | Admitting: Cardiology

## 2009-08-03 HISTORY — PX: TRANSTHORACIC ECHOCARDIOGRAM: SHX275

## 2009-08-12 ENCOUNTER — Ambulatory Visit: Payer: Self-pay | Admitting: Internal Medicine

## 2009-08-23 ENCOUNTER — Encounter: Payer: Self-pay | Admitting: Pulmonary Disease

## 2009-09-21 ENCOUNTER — Ambulatory Visit: Payer: Self-pay | Admitting: Pulmonary Disease

## 2009-09-21 ENCOUNTER — Ambulatory Visit: Payer: Self-pay | Admitting: Internal Medicine

## 2009-11-18 ENCOUNTER — Ambulatory Visit: Payer: Self-pay | Admitting: Internal Medicine

## 2009-11-18 LAB — CONVERTED CEMR LAB
CO2: 30 meq/L (ref 19–32)
Calcium: 9.5 mg/dL (ref 8.4–10.5)
Creatinine, Ser: 1.9 mg/dL — ABNORMAL HIGH (ref 0.4–1.5)
Glucose, Bld: 106 mg/dL — ABNORMAL HIGH (ref 70–99)
LDL Cholesterol: 92 mg/dL (ref 0–99)
Sodium: 143 meq/L (ref 135–145)
Total CHOL/HDL Ratio: 4
Triglycerides: 167 mg/dL — ABNORMAL HIGH (ref 0.0–149.0)
VLDL: 33.4 mg/dL (ref 0.0–40.0)

## 2010-01-06 ENCOUNTER — Ambulatory Visit: Payer: Self-pay | Admitting: Internal Medicine

## 2010-01-06 DIAGNOSIS — S8000XA Contusion of unspecified knee, initial encounter: Secondary | ICD-10-CM

## 2010-01-06 DIAGNOSIS — K5909 Other constipation: Secondary | ICD-10-CM

## 2010-01-06 DIAGNOSIS — S0010XA Contusion of unspecified eyelid and periocular area, initial encounter: Secondary | ICD-10-CM | POA: Insufficient documentation

## 2010-01-06 DIAGNOSIS — K921 Melena: Secondary | ICD-10-CM

## 2010-01-06 HISTORY — DX: Other constipation: K59.09

## 2010-01-06 LAB — CONVERTED CEMR LAB
ALT: 15 units/L (ref 0–53)
AST: 26 units/L (ref 0–37)
Alkaline Phosphatase: 35 units/L — ABNORMAL LOW (ref 39–117)
Basophils Absolute: 0.2 10*3/uL — ABNORMAL HIGH (ref 0.0–0.1)
Bilirubin Urine: NEGATIVE
Bilirubin, Direct: 0.3 mg/dL (ref 0.0–0.3)
CO2: 25 meq/L (ref 19–32)
Chloride: 105 meq/L (ref 96–112)
Hemoglobin: 10.9 g/dL — ABNORMAL LOW (ref 13.0–17.0)
INR: 1 (ref 0.8–1.0)
Leukocytes, UA: NEGATIVE
Lymphocytes Relative: 22 % (ref 12.0–46.0)
Monocytes Relative: 6.5 % (ref 3.0–12.0)
Neutro Abs: 4.5 10*3/uL (ref 1.4–7.7)
Neutrophils Relative %: 68.5 % (ref 43.0–77.0)
Nitrite: NEGATIVE
Potassium: 4.4 meq/L (ref 3.5–5.1)
RDW: 14.4 % (ref 11.5–14.6)
Sodium: 140 meq/L (ref 135–145)
Total Bilirubin: 1 mg/dL (ref 0.3–1.2)
Total Protein, Urine: 30 mg/dL
Total Protein: 7 g/dL (ref 6.0–8.3)
Urine Glucose: NEGATIVE mg/dL
pH: 7.5 (ref 5.0–8.0)

## 2010-01-07 ENCOUNTER — Encounter: Payer: Self-pay | Admitting: Internal Medicine

## 2010-01-13 ENCOUNTER — Encounter: Payer: Self-pay | Admitting: Internal Medicine

## 2010-01-19 ENCOUNTER — Ambulatory Visit: Payer: Self-pay | Admitting: Internal Medicine

## 2010-01-27 ENCOUNTER — Encounter: Payer: Self-pay | Admitting: Pulmonary Disease

## 2010-02-18 ENCOUNTER — Ambulatory Visit: Payer: Self-pay | Admitting: Internal Medicine

## 2010-03-22 ENCOUNTER — Ambulatory Visit: Admit: 2010-03-22 | Payer: Self-pay | Admitting: Internal Medicine

## 2010-03-27 ENCOUNTER — Encounter: Payer: Self-pay | Admitting: Gastroenterology

## 2010-03-28 ENCOUNTER — Encounter: Payer: Self-pay | Admitting: Gastroenterology

## 2010-03-29 ENCOUNTER — Encounter: Payer: Self-pay | Admitting: Internal Medicine

## 2010-04-01 ENCOUNTER — Other Ambulatory Visit: Payer: Self-pay | Admitting: Internal Medicine

## 2010-04-01 ENCOUNTER — Ambulatory Visit
Admission: RE | Admit: 2010-04-01 | Discharge: 2010-04-01 | Payer: Self-pay | Source: Home / Self Care | Attending: Internal Medicine | Admitting: Internal Medicine

## 2010-04-01 DIAGNOSIS — N183 Chronic kidney disease, stage 3 (moderate): Secondary | ICD-10-CM

## 2010-04-01 DIAGNOSIS — N259 Disorder resulting from impaired renal tubular function, unspecified: Secondary | ICD-10-CM

## 2010-04-01 DIAGNOSIS — E1122 Type 2 diabetes mellitus with diabetic chronic kidney disease: Secondary | ICD-10-CM | POA: Insufficient documentation

## 2010-04-01 HISTORY — DX: Disorder resulting from impaired renal tubular function, unspecified: N25.9

## 2010-04-01 LAB — CBC WITH DIFFERENTIAL/PLATELET
Basophils Absolute: 0 10*3/uL (ref 0.0–0.1)
Eosinophils Absolute: 0.2 10*3/uL (ref 0.0–0.7)
HCT: 33.3 % — ABNORMAL LOW (ref 39.0–52.0)
Lymphs Abs: 1.6 10*3/uL (ref 0.7–4.0)
MCHC: 34.3 g/dL (ref 30.0–36.0)
MCV: 98.7 fl (ref 78.0–100.0)
Monocytes Absolute: 0.4 10*3/uL (ref 0.1–1.0)
Platelets: 272 10*3/uL (ref 150.0–400.0)
RDW: 13.9 % (ref 11.5–14.6)

## 2010-04-01 LAB — BASIC METABOLIC PANEL
BUN: 28 mg/dL — ABNORMAL HIGH (ref 6–23)
Chloride: 108 mEq/L (ref 96–112)
GFR: 36.66 mL/min — ABNORMAL LOW (ref >60.00)
Glucose, Bld: 90 mg/dL (ref 70–99)
Potassium: 4.3 mEq/L (ref 3.5–5.1)

## 2010-04-03 LAB — CONVERTED CEMR LAB
Basophils Absolute: 0 10*3/uL (ref 0.0–0.1)
Bilirubin Urine: NEGATIVE
CO2: 28 meq/L (ref 19–32)
Chloride: 104 meq/L (ref 96–112)
Eosinophils Absolute: 0.1 10*3/uL (ref 0.0–0.7)
Glucose, Bld: 154 mg/dL — ABNORMAL HIGH (ref 70–99)
HCT: 25.8 % — ABNORMAL LOW (ref 39.0–52.0)
Hemoglobin: 8.9 g/dL — ABNORMAL LOW (ref 13.0–17.0)
Ketones, ur: NEGATIVE mg/dL
Leukocytes, UA: NEGATIVE
Lymphs Abs: 1.2 10*3/uL (ref 0.7–4.0)
MCHC: 34.4 g/dL (ref 30.0–36.0)
MCV: 94.5 fL (ref 78.0–100.0)
Monocytes Absolute: 0.5 10*3/uL (ref 0.1–1.0)
Monocytes Relative: 7.1 % (ref 3.0–12.0)
Neutro Abs: 5 10*3/uL (ref 1.4–7.7)
Potassium: 3.6 meq/L (ref 3.5–5.1)
RDW: 14.5 % (ref 11.5–14.6)
Sodium: 139 meq/L (ref 135–145)
pH: 6 (ref 5.0–8.0)

## 2010-04-05 NOTE — Progress Notes (Signed)
----   Converted from flag ---- ---- 06/11/2009 7:54 AM, Zella Ball Ewing wrote: I called pts wife and informed of above. She stated she is bringing his urine to the lab today also.  ---- 06/10/2009 10:06 PM, Corwin Levins MD wrote: Zella Ball, please call wife - labs normal, xray shows constipation -    I would recommend first a Fleets enema, or if able to take better by mouth today can try OTC mag citrate - 1 bottle ------------------------------

## 2010-04-05 NOTE — Progress Notes (Signed)
Summary: Diarrhea  Phone Note Call from Patient Call back at Home Phone [phone]   Caller: Jerline Pain Summary of Call: pt's wife called stating that pt was released from Rehab on Friday. Pt's wife says that he has been having moderate diarrhea since coming home. Pt has tried Immodium with no relief. Wife is worried about the pt becoming dehydrated. please advise pt has appt with MD 03/31/2009 @ 11:45am. Initial call taken by: Margaret Pyle, CMA,  March 29, 2009 8:23 AM  Follow-up for Phone Call        is there any fever , n/v, abd pain or blood or increased weakness/dizziness - if so, should go to ER;  if not we can try lomotil as needed - done hardcopy to LIM side B - dahlia  Follow-up by: Corwin Levins MD,  March 29, 2009 12:59 PM  Additional Follow-up for Phone Call Additional follow up Details #1::        pt's spouse informed and rx faxed to pharmacy per pt request Additional Follow-up by: Margaret Pyle, CMA,  March 29, 2009 1:30 PM    New/Updated Medications: LOMOTIL 2.5-0.025 MG TABS (DIPHENOXYLATE-ATROPINE) 1 by mouth as needed loose stool, max 8 tabs per 24 hrs Prescriptions: LOMOTIL 2.5-0.025 MG TABS (DIPHENOXYLATE-ATROPINE) 1 by mouth as needed loose stool, max 8 tabs per 24 hrs  #40 x 1   Entered and Authorized by:   Corwin Levins MD   Signed by:   Corwin Levins MD on 03/29/2009   Method used:   Print then Give to Patient   RxID:   [id number]

## 2010-04-05 NOTE — Letter (Signed)
Summary: [facility]   Imported By: Sherian Rein 06/21/2009 13:14:43  _____________________________________________________________________  External Attachment:    Type:   Image     Comment:   External Document

## 2010-04-05 NOTE — Letter (Signed)
Summary: EGD Instructions  [facility]  [address] [place], [place] [address]   Phone: [phone]  Fax: [phone]       KAMRON VANWYHE    [birth year]    MRN: [id number]       Procedure Day /Date:WEDNESDAY 05/12/2009     Arrival Time:2:30PM     Procedure Time:3:30PM     Location of Procedure:                    X  [facility] (4th Floor)  PREPARATION FOR ENDOSCOPY   On 05/12/2009  THE DAY OF THE PROCEDURE:  1.   No solid foods, milk or milk products are allowed after midnight the night before your procedure.  2.   Do not drink anything colored red or purple.  Avoid juices with pulp.  No orange juice.  3.  You may drink clear liquids until1:30PM , which is 2 hours before your procedure.                                                                                                CLEAR LIQUIDS INCLUDE: Water Jello Ice Popsicles Tea (sugar ok, no milk/cream) Powdered fruit flavored drinks Coffee (sugar ok, no milk/cream) Gatorade Juice: apple, white grape, white cranberry  Lemonade Clear bullion, consomm, broth Carbonated beverages (any kind) Strained chicken noodle soup Hard Candy   MEDICATION INSTRUCTIONS  Unless otherwise instructed, you should take regular prescription medications with a small sip of water as early as possible the morning of your procedure.  Diabetic patients - see separate instructions.            OTHER INSTRUCTIONS  You will need a responsible adult at least 75 years of age to accompany you and drive you home.   This person must remain in the waiting room during your procedure.  Wear loose fitting clothing that is easily removed.  Leave jewelry and other valuables at home.  However, you may wish to bring a book to read or an iPod/MP3 player to listen to music as you wait for your procedure to start.  Remove all body piercing jewelry and leave at home.  Total time from sign-in until discharge is  approximately 2-3 hours.  You should go home directly after your procedure and rest.  You can resume normal activities the day after your procedure.  The day of your procedure you should not:   Drive   Make legal decisions   Operate machinery   Drink alcohol   Return to work  You will receive specific instructions about eating, activities and medications before you leave.    The above instructions have been reviewed and explained to me by   _______________________    I fully understand and can verbalize these instructions _____________________________ Date _________

## 2010-04-05 NOTE — Letter (Signed)
Summary: [facility]  [facility]   Imported By: Lester Delaware 02/04/2010 10:11:50  _____________________________________________________________________  External Attachment:    Type:   Image     Comment:   External Document

## 2010-04-05 NOTE — Progress Notes (Signed)
Summary: FYI-Paperwork  Phone Note Call from Patient Call back at Home Phone [phone]   Caller: Jerline Pain Summary of Call: Hoy Finlay called to inform MD that pt did recieve his walker with seat and has been using it. Mrs. Schauf thought this information might be useful to MD in filling out pt's paperwork. Initial call taken by: Margaret Pyle, CMA,  Jul 30, 2009 11:37 AM  Follow-up for Phone Call        good to know;  as for the paperwork, I was unaware of the paperwork to be done until this am;  now done  Follow-up by: Corwin Levins MD,  Jul 30, 2009 12:41 PM  Additional Follow-up for Phone Call Additional follow up Details #1::        Pt's spouse informed that paperwork ready for pick up by [facility] Additional Follow-up by: Margaret Pyle, CMA,  Jul 30, 2009 2:00 PM

## 2010-04-05 NOTE — Assessment & Plan Note (Signed)
Summary: 1 mth b12  jwj  stc   Nurse Visit   Allergies: 1)  ! Biaxin  Medication Administration  Injection # 1:    Medication: Vit B12 1000 mcg    Diagnosis: VITAMIN B12 DEFICIENCY (ICD-266.2)    Route: IM    Site: R deltoid    Exp Date: 01/05/2011    Lot #: [id number]    Mfr: [employer]ent    Patient tolerated injection without complications    Given by: Margaret Pyle, CMA (June 15, 2009 2:20 PM)  Orders Added: 1)  Admin of Therapeutic Inj  intramuscular or subcutaneous [96372] 2)  Vit B12 1000 mcg [J3420] 3)  Est. Patient Level I [13244]

## 2010-04-05 NOTE — Assessment & Plan Note (Signed)
Summary: COUGH/WENT TO ER MON NIGHT/NWS   Vital Signs:  Patient profile:   77 year old male Height:      69 inches Weight:      147 pounds O2 Sat:      98 % on Room air Temp:     98.4 degrees F oral Pulse rate:   65 / minute BP sitting:   142 / 70  (left arm) Cuff size:   regular  Vitals Entered ByZella Ball Ewing (April 13, 2009 3:18 PM)  O2 Flow:  Room air  CC: Cough, ER/RE   Primary Care Provider:  Oliver Barre, MD  CC:  Cough and ER/RE.  History of Present Illness: here after being seen late last evening in ER with cough, gag, vomit per wife and pt (primarily vomit it seems) seen and released home after tx with IVF's and antiemetic and neg routine labs (reviewed per echart);  This was second episode of significant vomiting in the past month for unkown reason, with the last episode assoc with RUL pna requiring hospn.  This episode without fever, ongoign cough, sob, CP or confusion.  Remains severe HOH today, but o/w no acute compalints and states nausea resolved after tx in the ER.  No abd pain, nausea, chills, diarrhea, or constipation.  No new meds, or OTC med use recent.  Overall good compliacne with meds.    Problems Prior to Update: 1)  Hip Pain, Right  (ICD-719.45) 2)  Pneumonia, Right Upper Lobe  (ICD-486) 3)  Uti  (ICD-599.0) 4)  Fever Unspecified  (ICD-780.60) 5)  Uti  (ICD-599.0) 6)  Dementia  (ICD-294.8) 7)  Hyperlipidemia  (ICD-272.4) 8)  Benign Prostatic Hypertrophy  (ICD-600.00) 9)  Hypertension  (ICD-401.9) 10)  Diabetes Mellitus, Type II  (ICD-250.00) 11)  Accidental Falls, Recurrent  (ICD-E888.9) 12)  Thrush  (ICD-112.0) 13)  Tinnitus  (ICD-388.30) 14)  Hypokalemia  (ICD-276.8) 15)  Vitamin B12 Deficiency  (ICD-266.2) 16)  Loss of Weight  (ICD-783.21) 17)  Anemia-nos  (ICD-285.9) 18)  Nausea With Vomiting  (ICD-787.01) 19)  B12 Deficiency  (ICD-266.2) 20)  Paresthesia  (ICD-782.0) 21)  Gait Disturbance  (ICD-781.2) 22)  Back Pain, Thoracic Region   (ICD-724.1) 23)  Preventive Health Care  (ICD-V70.0) 24)  Hypersomnia  (ICD-780.54) 25)  Peripheral Edema  (ICD-782.3) 26)  Olecranon Bursitis, Right  (ICD-726.33) 27)  Bursitis, Right Knee  (ICD-726.60) 28)  Abnormality of Gait  (ICD-781.2) 29)  Depression  (ICD-311) 30)  Arthritis  (ICD-716.90) 31)  Dizziness  (ICD-780.4) 32)  Hx of Clostridium Difficile Colitis  (ICD-008.45) 33)  Gerd  (ICD-530.81) 34)  Thyroid Nodule  (ICD-241.0) 35)  Nephrolithiasis, Hx of  (ICD-V13.01) 36)  Hypothyroidism  (ICD-244.9) 37)  Psa, Increased  (ICD-790.93) 38)  Rotator Cuff Syndrome, Left  (ICD-726.10) 39)  Sleep Apnea, Obstructive  (ICD-327.23) 40)  Psa, Increased  (ICD-790.93) 41)  Family History Diabetes 1st Degree Relative  (ICD-V18.0) 42)  Family History of Alcoholism/addiction  (ICD-V61.41) 43)  Neop, Malignant, 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 1 By Mouth Qd 2)  Benicar 40 Mg Tabs (Olmesartan Medoxomil) .Marland Kitchen.. 1po Once Daily 3)  Felodipine 10 Mg Tb24 (Felodipine) .... Take 1 By Mouth Qd 4)  Furosemide 20 Mg Tabs (Furosemide) .... Take 1 By Mouth Once Daily 5)  Levothroid 100 Mcg Tabs (  Levothyroxine Sodium) .... Take 1 Tablet By Mouth Once A Day 6)  Lansoprazole 30 Mg Cpdr (Lansoprazole) .Marland Kitchen.. 1 By Mouth Once Daily 7)  Onetouch Test  Strp (Glucose Blood) .... Use 1 Strip Once A Day - Dx Code 250.00 8)  Lansoprazole 30 Mg Cpdr (Lansoprazole) .Marland Kitchen.. 1 By Mouth Once Daily 9)  Citalopram Hydrobromide 20 Mg  Tabs (Citalopram Hydrobromide) .Marland Kitchen.. 1 By Mouth Once Daily 10)  Adult Aspirin Low Strength 81 Mg  Tbdp (Aspirin) .... Take 1 Tablet By Mouth Once A Day 11)  Alprazolam 0.25 Mg  Tabs (Alprazolam) .Marland Kitchen.. 1by Mouth  Two Times A Day As Needed 12)  Proair Hfa 108 (90 Base) Mcg/act  Aers (Albuterol Sulfate) .... 2 Puffs Q 6 Hrs As  Needed 13)  Tylenol Extra Strength 500 Mg Tabs (Acetaminophen) .... Once Daily 14)  Vitamin D3 1000 Unit  Tabs (Cholecalciferol) .Marland Kitchen.. 1 By Mouth Daily 15)  Cyanocobalamin 1000 Mcg/ml Soln (Cyanocobalamin) .Marland Kitchen.. 1 Cc Im Q Month 16)  Wellbutrin Xl 150 Mg Xr24h-Tab (Bupropion Hcl) .Marland Kitchen.. 1po Once Daily (Generic Ok) 17)  Creon 12000 Unit Cpep (Pancrelipase (Lip-Prot-Amyl)) .Marland Kitchen.. 1-2 Tablets By Mouth Three Times A Day With Each Meal 18)  Meclizine Hcl 25 Mg Tabs (Meclizine Hcl) .... 1/2 - 1 By Mouth Q 6 Hrs As Needed Dizziness 19)  Metformin Hcl 500 Mg Tabs (Metformin Hcl) .Marland Kitchen.. 1 By Mouth Two Times A Day 20)  Zyprexa 5 Mg Tabs (Olanzapine) .Marland Kitchen.. 1po Once Daily 21)  Promethegan 25 Mg Supp (Promethazine Hcl) .Marland Kitchen.. 1 Suppository Per Rectum Every Six Hours 22)  Flomax 0.4 Mg Caps (Tamsulosin Hcl) .Marland Kitchen.. 1 By Mouth Once Qhs 23)  Lomotil 2.5-0.025 Mg Tabs (Diphenoxylate-Atropine) .Marland Kitchen.. 1 By Mouth As Needed Loose Stool, Max 8 Tabs Per 24 Hrs 24)  Proair Hfa 108 (90 Base) Mcg/act Aers (Albuterol Sulfate) .... 2 Puffs Qid As Needed 25)  Lovastatin 40 Mg Tabs (Lovastatin) .Marland Kitchen.. 1 By Mouth Once Daily  Current Medications (verified): 1)  Fenofibrate 160 Mg Tabs (Fenofibrate) .Marland Kitchen.. 1 By Mouth Qd 2)  Benicar 40 Mg Tabs (Olmesartan Medoxomil) .Marland Kitchen.. 1po Once Daily 3)  Felodipine 10 Mg Tb24 (Felodipine) .... Take 1 By Mouth Qd 4)  Furosemide 20 Mg Tabs (Furosemide) .... Take 1 By Mouth Once Daily 5)  Levothroid 100 Mcg Tabs (Levothyroxine Sodium) .... Take 1 Tablet By Mouth Once A Day 6)  Lansoprazole 30 Mg Cpdr (Lansoprazole) .Marland Kitchen.. 1 By Mouth Once Daily 7)  Onetouch Test  Strp (Glucose Blood) .... Use 1 Strip Once A Day - Dx Code 250.00 8)  Lansoprazole 30 Mg Cpdr (Lansoprazole) .Marland Kitchen.. 1 By Mouth Once Daily 9)  Citalopram Hydrobromide 20 Mg  Tabs (Citalopram Hydrobromide) .Marland Kitchen.. 1 By Mouth Once Daily 10)  Adult Aspirin Low Strength 81 Mg  Tbdp (Aspirin) .... Take 1 Tablet By Mouth Once A Day 11)  Alprazolam 0.25 Mg  Tabs  (Alprazolam) .Marland Kitchen.. 1by Mouth  Two Times A Day As Needed 12)  Proair Hfa 108 (90 Base) Mcg/act  Aers (Albuterol Sulfate) .... 2 Puffs Q 6 Hrs As Needed 13)  Tylenol Extra Strength 500 Mg Tabs (Acetaminophen) .... Once Daily 14)  Vitamin D3 1000 Unit  Tabs (Cholecalciferol) .Marland Kitchen.. 1 By Mouth Daily 15)  Cyanocobalamin 1000 Mcg/ml Soln (Cyanocobalamin) .Marland Kitchen.. 1 Cc Im Q Month 16)  Wellbutrin Xl 150 Mg Xr24h-Tab (Bupropion Hcl) .Marland Kitchen.. 1po Once Daily (Generic Ok) 17)  Creon 12000 Unit Cpep (Pancrelipase (Lip-Prot-Amyl)) .Marland Kitchen.. 1-2 Tablets By Mouth Three Times A Day With Each  Meal 18)  Meclizine Hcl 25 Mg Tabs (Meclizine Hcl) .... 1/2 - 1 By Mouth Q 6 Hrs As Needed Dizziness 19)  Metformin Hcl 500 Mg Tabs (Metformin Hcl) .Marland Kitchen.. 1 By Mouth Two Times A Day 20)  Zyprexa 5 Mg Tabs (Olanzapine) .Marland Kitchen.. 1po Once Daily 21)  Zofran 4 Mg Tabs (Ondansetron Hcl) .Marland Kitchen.. 1po Q 6 Hrs As Needed Nausea 22)  Flomax 0.4 Mg Caps (Tamsulosin Hcl) .Marland Kitchen.. 1 By Mouth Once Qhs 23)  Lomotil 2.5-0.025 Mg Tabs (Diphenoxylate-Atropine) .Marland Kitchen.. 1 By Mouth As Needed Loose Stool, Max 8 Tabs Per 24 Hrs 24)  Proair Hfa 108 (90 Base) Mcg/act Aers (Albuterol Sulfate) .... 2 Puffs Qid As Needed  Allergies (verified): 1)  ! 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   Head:  normocephalic and atraumatic.   Eyes:  vision grossly intact, pupils equal, and pupils round.   Ears:  R ear normal and L ear normal.   Nose:  no external deformity and no nasal discharge.   Mouth:  no gingival abnormalities and pharynx pink and moist.   Neck:  supple and no masses.   Lungs:  normal respiratory effort and normal breath sounds.   Heart:  normal rate and regular rhythm.   Extremities:  no edema, no erythema    Impression & Recommendations:  Problem # 1:  NAUSEA WITH VOMITING (ICD-787.01)  recurrent with risk of what I suspect was aspirate pna recurring - refer GI, cont PPI, stop the statin for now; zofran as needed , may need EGD, do not feel further labs needed at this time - echart labs reviewed with pt; consider gastric empyting study with hx x of dm  Orders: Gastroenterology Referral (GI)  Problem # 2:  PNEUMONIA, RIGHT UPPER LOBE (ICD-486)  to check f/u cxr per wife request to r/o subclinical recurrent pna  Orders: T-2 View CXR, Same Day (71020.5TC)  Problem # 3:  HYPERTENSION (ICD-401.9)  His updated medication list for this problem includes:    Benicar 40 Mg Tabs (Olmesartan medoxomil) .Marland Kitchen... 1po once daily    Felodipine 10 Mg Tb24 (Felodipine) .Marland Kitchen... Take 1 by mouth qd    Furosemide 20 Mg Tabs (Furosemide) .Marland Kitchen... Take 1 by mouth once daily  BP today: 142/70 Prior BP: 124/58 (04/01/2009)  Labs Reviewed: K+: 3.8 (01/25/2009) Creat: : 1.6 (01/25/2009)   Chol: 142 (06/23/2008)   HDL: 48.30 (06/23/2008)   LDL: 65 (06/23/2008)   TG: 143.0 (06/23/2008) stable overall by hx and exam, ok to continue meds/tx as is   Complete Medication List: 1)  Fenofibrate 160 Mg Tabs (Fenofibrate) .Marland Kitchen.. 1 by mouth qd 2)  Benicar 40 Mg Tabs (Olmesartan medoxomil) .Marland Kitchen.. 1po once daily 3)  Felodipine 10 Mg Tb24 (Felodipine) .... Take 1 by mouth qd 4)  Furosemide 20 Mg Tabs  (Furosemide) .... Take 1 by mouth once daily  5)  Levothroid 100 Mcg Tabs (Levothyroxine sodium) .... Take 1 tablet by mouth once a day 6)  Lansoprazole 30 Mg Cpdr (Lansoprazole) .Marland Kitchen.. 1 by mouth once daily 7)  Onetouch Test Strp (Glucose blood) .... Use 1 strip once a day - dx code 250.00 8)  Lansoprazole 30 Mg Cpdr (Lansoprazole) .Marland Kitchen.. 1 by mouth once daily 9)  Citalopram Hydrobromide 20 Mg Tabs (Citalopram hydrobromide) .Marland Kitchen.. 1 by mouth once daily 10)  Adult Aspirin Low Strength 81 Mg Tbdp (Aspirin) .... Take 1 tablet by mouth once a day 11)  Alprazolam 0.25 Mg Tabs (Alprazolam) .Marland Kitchen.. 1by mouth  two times a day as needed 12)  Proair Hfa 108 (90 Base) Mcg/act Aers (Albuterol sulfate) .... 2 puffs q 6 hrs as needed 13)  Tylenol Extra Strength 500 Mg Tabs (Acetaminophen) .... Once daily 14)  Vitamin D3 1000 Unit Tabs (Cholecalciferol) .Marland Kitchen.. 1 by mouth daily 15)  Cyanocobalamin 1000 Mcg/ml Soln (Cyanocobalamin) .Marland Kitchen.. 1 cc im q month 16)  Wellbutrin Xl 150 Mg Xr24h-tab (Bupropion hcl) .Marland Kitchen.. 1po once daily (generic ok) 17)  Creon 12000 Unit Cpep (Pancrelipase (lip-prot-amyl)) .Marland Kitchen.. 1-2 tablets by mouth three times a day with each meal 18)  Meclizine Hcl 25 Mg Tabs (Meclizine hcl) .... 1/2 - 1 by mouth q 6 hrs as needed dizziness 19)  Metformin Hcl 500 Mg Tabs (Metformin hcl) .Marland Kitchen.. 1 by mouth two times a day 20)  Zyprexa 5 Mg Tabs (Olanzapine) .Marland Kitchen.. 1po once daily 21)  Zofran 4 Mg Tabs (Ondansetron hcl) .Marland Kitchen.. 1po q 6 hrs as needed nausea 22)  Flomax 0.4 Mg Caps (Tamsulosin hcl) .Marland Kitchen.. 1 by mouth once qhs 23)  Lomotil 2.5-0.025 Mg Tabs (Diphenoxylate-atropine) .Marland Kitchen.. 1 by mouth as needed loose stool, max 8 tabs per 24 hrs 24)  Proair Hfa 108 (90 Base) Mcg/act Aers (Albuterol sulfate) .... 2 puffs qid as needed  Patient Instructions: 1)  stop the lovastatin for now 2)  Please take all new medications as prescribed - the generic zofran for nausea 3)  Continue all previous medications as before this visit 4)  You  will be contacted about the referral(s) to: GI 5)  Please go to Radiology in the basement level for your X-Ray today  6)  Followup as you already have planned Prescriptions: ZOFRAN 4 MG TABS (ONDANSETRON HCL) 1po q 6 hrs as needed nausea  #40 x 1   Entered and Authorized by:   Corwin Levins MD   Signed by:   Corwin Levins MD on 04/13/2009   Method used:   Electronically to        [facility] [phone]* (retail)       [address] [place], [place]  [address]       Ph: [phone]       Fax: [phone]   RxID:   [id number]

## 2010-04-05 NOTE — Assessment & Plan Note (Signed)
Summary: ER FU--FELL/HIT HEAD--STC   Vital Signs:  Patient profile:   77 year old male Height:      69.5 inches Weight:      160.13 pounds O2 Sat:      93 % on Room air Temp:     97.4 degrees F oral Pulse rate:   59 / minute BP supine:   162 / 62 BP sitting:   162 / 62  (left arm) BP standing:   150 / 58 Cuff size:   regular  Vitals Entered ByZella Ball Ewing (June 08, 2009 3:33 PM)  O2 Flow:  Room air  Reason for Visit with dizziness on standing  CC: ER Followup/RE   Primary Care Provider:  Oliver Barre, MD  CC:  ER Followup/RE.  History of Present Illness: wt up from 143 to 160 since last visit mar 15;  incidently had some urinary incontinence in the ER yesterday and has had a wetting accident or two at home but denies GU symptoms such as dysuria, freq or urgency or blood;  wife here with him;  they were shopping at [facility] and she had gone around the corner to pick something off the shelf, came back a few steps and found him on the floor;  she had noticed some short weak steps prior to the fall but no other obvious problems;  pt himself was walking with cane, somehow got "off balalnce" (has been working with PT for this (but not for the last 3 wks after PT done);  was holding on to everything he could but ended up on the floor;  striking the head on the floor rather hard according to another person who witnessed the fall; he denies LOC or lightheaded  as he fellfell backward -  = just "got off balance" and weak;  Pt denies CP, sob, doe, wheezing, orthopnea, pnd, worsening LE edema, palps, dizziness or syncope .  No obvious reason for the fatigue  per pt.  Taken to the ER per EMS but vitals not recorded on records I can access, and no orthostatics done in the ER.  Started with nausea as he got into the ambulance, then dry heaves several times in the ER.  Treated wtih IVF's in the Er and sympt meds, adn CT head neg;  and went home approx 9pm, tried ensure but vomited that up.  SBP approx  200 but asympt but allowed to go home.  Slept ok last night, no further n/v.  had some headhache this AM when gettting up , but no Pt denies new neuro symptoms such as headache, facial or extremity weakness  Only had to take regular meds this am, as well as the meclizine.  No fever, St, cough, or feverish.  No further n/v,  abd pain, change in bowels except did not have regular BM as he usually does.  Has had some constipation more recently.  No further fall, injury, rash, joint pains or other complaints.  Is on mult meds and wants to consolidate if possible.  Recently saw Dr Arlice Colt, then dr Ezzard Standing, and pt declined option of lap nissen  Problems Prior to Update: 1)  Fatigue  (ICD-780.79) 2)  Preventive Health Care  (ICD-V70.0) 3)  Hip Pain, Right  (ICD-719.45) 4)  Pneumonia, Right Upper Lobe  (ICD-486) 5)  Uti  (ICD-599.0) 6)  Fever Unspecified  (ICD-780.60) 7)  Uti  (ICD-599.0) 8)  Dementia  (ICD-294.8) 9)  Hyperlipidemia  (ICD-272.4) 10)  Benign Prostatic Hypertrophy  (ICD-600.00)  11)  Hypertension  (ICD-401.9) 12)  Diabetes Mellitus, Type II  (ICD-250.00) 13)  Accidental Falls, Recurrent  (ICD-E888.9) 14)  Thrush  (ICD-112.0) 15)  Tinnitus  (ICD-388.30) 16)  Hypokalemia  (ICD-276.8) 17)  Vitamin B12 Deficiency  (ICD-266.2) 18)  Loss of Weight  (ICD-783.21) 19)  Anemia-nos  (ICD-285.9) 20)  Nausea With Vomiting  (ICD-787.01) 21)  B12 Deficiency  (ICD-266.2) 22)  Paresthesia  (ICD-782.0) 23)  Gait Disturbance  (ICD-781.2) 24)  Back Pain, Thoracic Region  (ICD-724.1) 25)  Preventive Health Care  (ICD-V70.0) 26)  Hypersomnia  (ICD-780.54) 27)  Peripheral Edema  (ICD-782.3) 28)  Olecranon Bursitis, Right  (ICD-726.33) 29)  Bursitis, Right Knee  (ICD-726.60) 30)  Abnormality of Gait  (ICD-781.2) 31)  Depression  (ICD-311) 32)  Arthritis  (ICD-716.90) 33)  Dizziness  (ICD-780.4) 34)  Hx of Clostridium Difficile Colitis  (ICD-008.45) 35)  Gerd  (ICD-530.81) 36)  Thyroid Nodule   (ICD-241.0) 37)  Nephrolithiasis, Hx of  (ICD-V13.01) 38)  Hypothyroidism  (ICD-244.9) 39)  Psa, Increased  (ICD-790.93) 40)  Rotator Cuff Syndrome, Left  (ICD-726.10) 41)  Sleep Apnea, Obstructive  (ICD-327.23) 42)  Psa, Increased  (ICD-790.93) 43)  Family History Diabetes 1st Degree Relative  (ICD-V18.0) 44)  Family History of Alcoholism/addiction  (ICD-V61.41) 45)  Neop, Malignant, 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 1 By Mouth Qd 2)  Benicar 40 Mg Tabs (Olmesartan Medoxomil) .Marland Kitchen.. 1po Once Daily 3)  Felodipine 10 Mg Tb24 (Felodipine) .... Take 1 By Mouth Qd 4)  Furosemide 20 Mg Tabs (Furosemide) .... Take 1 By Mouth Once Daily 5)  Levothroid 100 Mcg Tabs (Levothyroxine Sodium) .... Take 1 Tablet By Mouth Once A Day 6)  Lansoprazole 30 Mg Cpdr (Lansoprazole) .Marland Kitchen.. 1 By Mouth Once Daily 7)  Onetouch Test  Strp (Glucose Blood) .... Use 1 Strip Once A Day - Dx Code 250.00 8)  Lansoprazole 30 Mg Cpdr (Lansoprazole) .Marland Kitchen.. 1 By Mouth Once Daily 9)  Citalopram Hydrobromide 20 Mg  Tabs (Citalopram Hydrobromide) .Marland Kitchen.. 1 By Mouth Once Daily 10)  Adult Aspirin Low Strength 81 Mg  Tbdp (Aspirin) .... Take 1 Tablet By Mouth Once A Day 11)  Alprazolam 0.25 Mg  Tabs (Alprazolam) .Marland Kitchen.. 1by Mouth  Two Times A Day As Needed - To Fill May 10, 2009 12)  Proair Hfa 108 (90 Base) Mcg/act  Aers (Albuterol Sulfate) .... 2 Puffs Q 6 Hrs As Needed 13)  Tylenol Extra Strength 500 Mg Tabs (Acetaminophen) .... Once Daily 14)  Vitamin D3 1000 Unit  Tabs (Cholecalciferol) .Marland Kitchen.. 1 By Mouth Daily 15)  Cyanocobalamin 1000 Mcg/ml Soln (Cyanocobalamin) .Marland Kitchen.. 1 Cc Im Q Month 16)  Wellbutrin Xl 150 Mg Xr24h-Tab (Bupropion Hcl) .Marland Kitchen.. 1po Once Daily (Generic Ok) 17)  Creon 12000 Unit Cpep (Pancrelipase (Lip-Prot-Amyl)) .Marland Kitchen.. 1-2 Tablets By Mouth Three Times A  Day With Each Meal 18)  Meclizine Hcl 25 Mg Tabs (Meclizine Hcl) .... 1/2 - 1 By Mouth Q 6 Hrs As Needed Dizziness 19)  Metformin Hcl 500 Mg Tabs (Metformin Hcl) .Marland Kitchen.. 1 By Mouth Two Times A Day 20)  Zyprexa 5 Mg Tabs (Olanzapine) .Marland Kitchen.. 1po Once Daily 21)  Zofran 4 Mg Tabs (Ondansetron Hcl) .Marland Kitchen.. 1po Q 6 Hrs As Needed Nausea 22)  Flomax 0.4 Mg Caps (Tamsulosin Hcl) .Marland Kitchen.. 1 By Mouth Once Qhs 23)  Lomotil 2.5-0.025 Mg Tabs (Diphenoxylate-Atropine) .Marland Kitchen.. 1 By Mouth As Needed Loose Stool, Max 8 Tabs Per 24 Hrs 24)  Lovastatin 40 Mg Tabs (Lovastatin) .Marland Kitchen.. 1po Once Daily  Current Medications (verified): 1)  Fenofibrate 160 Mg Tabs (Fenofibrate) .Marland Kitchen.. 1 By Mouth Qd 2)  Azor 10-40 Mg Tabs (Amlodipine-Olmesartan) .Marland Kitchen.. 1po Once Daily 3)  Furosemide 20 Mg Tabs (Furosemide) .... Take 1 By Mouth Once Daily As Needed Only For Swelling 4)  Levothroid 100 Mcg Tabs (Levothyroxine Sodium) .... Take 1 Tablet By Mouth Once A Day 5)  Onetouch Test  Strp (Glucose Blood) .... Use 1 Strip Once A Day - Dx Code 250.00 6)  Lansoprazole 30 Mg Cpdr (Lansoprazole) .Marland Kitchen.. 1 By Mouth Once Daily 7)  Citalopram Hydrobromide 20 Mg  Tabs (Citalopram Hydrobromide) .Marland Kitchen.. 1 By Mouth Once Daily 8)  Adult Aspirin Low Strength 81 Mg  Tbdp (Aspirin) .... Take 1 Tablet By Mouth Once A Day 9)  Alprazolam 0.25 Mg  Tabs (Alprazolam) .Marland Kitchen.. 1by Mouth  Two Times A Day As Needed - To Fill May 10, 2009 10)  Proair Hfa 108 (90 Base) Mcg/act  Aers (Albuterol Sulfate) .... 2 Puffs Q 6 Hrs As Needed 11)  Tylenol Extra Strength 500 Mg Tabs (Acetaminophen) .... Once Daily 12)  Vitamin D3 1000 Unit  Tabs (Cholecalciferol) .Marland Kitchen.. 1 By Mouth Daily 13)  Cyanocobalamin 1000 Mcg/ml Soln (Cyanocobalamin) .Marland Kitchen.. 1 Cc Im Q Month 14)  Wellbutrin Xl 150 Mg Xr24h-Tab (Bupropion Hcl) .Marland Kitchen.. 1po Once Daily (Generic Ok) 15)  Creon 12000 Unit Cpep (Pancrelipase (Lip-Prot-Amyl)) .Marland Kitchen.. 1-2 Tablets By Mouth Three Times A Day With Each Meal 16)  Meclizine Hcl 25 Mg Tabs (Meclizine Hcl)  .... 1/2 - 1 By Mouth Q 6 Hrs As Needed Dizziness 17)  Metformin Hcl 500 Mg Tabs (Metformin Hcl) .Marland Kitchen.. 1 By Mouth Two Times A Day 18)  Zyprexa 5 Mg Tabs (Olanzapine) .Marland Kitchen.. 1po Once Daily 19)  Zofran 4 Mg Tabs (Ondansetron Hcl) .Marland Kitchen.. 1po Q 6 Hrs As Needed Nausea 20)  Flomax 0.4 Mg Caps (Tamsulosin Hcl) .Marland Kitchen.. 1 By Mouth Once Qhs 21)  Lomotil 2.5-0.025 Mg Tabs (Diphenoxylate-Atropine) .Marland Kitchen.. 1 By Mouth As Needed Loose Stool, Max 8 Tabs Per 24 Hrs 22)  Lovastatin 40 Mg Tabs (Lovastatin) .Marland Kitchen.. 1po Once Daily  Allergies (verified): 1)  ! 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  Risk Factors: Smoking Status: never (01/14/2007)  Past Medical History: Diabetes mellitus, type II Hyperlipidemia Hypertension Colon cancer,hx of Vocal Cord cancer Osteoporosis Benign prostatic hypertrophy OSA chronic rotater cuff syndrome Elevated PSA Hypothyroidism Nephrolithiasis thyroid nodule GERD presumed c diff colitis 11/08 Dementia Anemia-NOS B12 deficiency Hemorrhoids  Review of Systems  The patient denies anorexia, fever, vision loss, hoarseness, chest pain, syncope, peripheral edema, prolonged cough, headaches, hemoptysis, abdominal pain, melena, hematochezia, severe indigestion/heartburn, hematuria, muscle weakness, depression, unusual weight change, abnormal bleeding, enlarged lymph nodes, and angioedema.         all otherwise negative per pt -    Physical Exam  General:  alert and well-developed.   Head:   normocephalic and atraumatic.  ,  I dont see or feel swelling, laceration or contusion to back of head; does have some tenderness to the upper back of the head just aboe the occiput area posteriorly Eyes:  vision grossly intact, pupils equal, and pupils round.   Ears:  R ear normal and L ear normal.   Nose:  no external deformity and no nasal discharge.   Mouth:  no gingival abnormalities and pharynx pink and moist.   Neck:  supple and no masses.   Lungs:  normal respiratory effort and normal breath sounds.   Heart:  normal rate and regular rhythm.   Abdomen:  soft, non-tender, and normal bowel sounds.   Msk:  no joint tenderness and no joint swelling.   Extremities:  no edema, no erythema  Neurologic:  severe HOH o/w cn 2-12 intact and no gross motor abnormality;  gait ok with walker - no change;  did have dizziness on formal orthostatics on standing Skin:  color normal and no ecchymoses.     Impression & Recommendations:  Problem # 1:  ACCIDENTAL FALLS, RECURRENT (ICD-E888.9)  etiology unclear, diff includes relative dehydration due to lasix and mult meds, including the flomax, worsening anemia, UTI, hypoglycemia,  orthostatic hypotension;   ECG reviewed ;  found to be orthostatic today - received IVF's in the ER yesterday and likely ok without further at this time;  will d/c the lasix, ask to drink extra fluids for a few days, to use lasix in the future as needed swelling; and re-check cbc, UA; Continue all other previous medications as before this visit ; consider neuro referral  Orders: EKG w/ Interpretation (93000) TLB-BMP (Basic Metabolic Panel-BMET) (80048-METABOL) TLB-CBC Platelet - w/Differential (85025-CBCD) TLB-Udip w/ Micro (81001-URINE)  Problem # 2:  HYPERTENSION (ICD-401.9)  The following medications were removed from the medication list:    Felodipine 10 Mg Tb24 (Felodipine) .Marland Kitchen... Take 1 by mouth qd His updated medication list for this problem includes:    Azor 10-40  Mg Tabs (Amlodipine-olmesartan) .Marland Kitchen... 1po once daily    Furosemide 20 Mg Tabs (Furosemide) .Marland Kitchen... Take 1 by mouth once daily as needed only for swelling adjust to above to consolidate the benicar/felodipine; stable overall by hx and exam, ok to continue meds/tx as is   Problem # 3:  DIABETES MELLITUS, TYPE II (ICD-250.00)  His updated medication list for this problem includes:    Azor 10-40 Mg Tabs (Amlodipine-olmesartan) .Marland Kitchen... 1po once daily    Adult Aspirin Low Strength 81 Mg Tbdp (Aspirin) .Marland Kitchen... Take 1 tablet by mouth once a day    Metformin Hcl 500 Mg Tabs (Metformin hcl) .Marland Kitchen... 1 by mouth two times a day  Labs Reviewed: Creat: 1.5 (05/18/2009)    Reviewed HgBA1c results: 6.0 (05/18/2009)  6.6 (06/23/2008) stable overall by hx and exam, ok to continue meds/tx as is   Problem # 4:  ANEMIA-NOS (ICD-285.9)  His updated medication list for this problem includes:    Cyanocobalamin 1000 Mcg/ml Soln (Cyanocobalamin) .Marland Kitchen... 1 cc im q month suspect may be worsening as above - to check f/u cbc as above  Complete Medication List: 1)  Fenofibrate 160 Mg Tabs (Fenofibrate) .Marland Kitchen.. 1 by mouth qd 2)  Azor 10-40 Mg Tabs (Amlodipine-olmesartan) .Marland Kitchen.. 1po once daily 3)  Furosemide 20 Mg Tabs (Furosemide) .... Take 1 by mouth once daily as needed only for swelling 4)  Levothroid 100 Mcg Tabs (Levothyroxine sodium) .... Take 1 tablet by mouth once a day 5)  Onetouch Test Strp (Glucose blood) .... Use  1 strip once a day - dx code 250.00 6)  Lansoprazole 30 Mg Cpdr (Lansoprazole) .Marland Kitchen.. 1 by mouth once daily 7)  Citalopram Hydrobromide 20 Mg Tabs (Citalopram hydrobromide) .Marland Kitchen.. 1 by mouth once daily 8)  Adult Aspirin Low Strength 81 Mg Tbdp (Aspirin) .... Take 1 tablet by mouth once a day 9)  Alprazolam 0.25 Mg Tabs (Alprazolam) .Marland Kitchen.. 1by mouth  two times a day as needed - to fill May 10, 2009 10)  Proair Hfa 108 (90 Base) Mcg/act Aers (Albuterol sulfate) .... 2 puffs q 6 hrs as needed 11)  Tylenol Extra  Strength 500 Mg Tabs (Acetaminophen) .... Once daily 12)  Vitamin D3 1000 Unit Tabs (Cholecalciferol) .Marland Kitchen.. 1 by mouth daily 13)  Cyanocobalamin 1000 Mcg/ml Soln (Cyanocobalamin) .Marland Kitchen.. 1 cc im q month 14)  Wellbutrin Xl 150 Mg Xr24h-tab (Bupropion hcl) .Marland Kitchen.. 1po once daily (generic ok) 15)  Creon 12000 Unit Cpep (Pancrelipase (lip-prot-amyl)) .Marland Kitchen.. 1-2 tablets by mouth three times a day with each meal 16)  Meclizine Hcl 25 Mg Tabs (Meclizine hcl) .... 1/2 - 1 by mouth q 6 hrs as needed dizziness 17)  Metformin Hcl 500 Mg Tabs (Metformin hcl) .Marland Kitchen.. 1 by mouth two times a day 18)  Zyprexa 5 Mg Tabs (Olanzapine) .Marland Kitchen.. 1po once daily 19)  Zofran 4 Mg Tabs (Ondansetron hcl) .Marland Kitchen.. 1po q 6 hrs as needed nausea 20)  Flomax 0.4 Mg Caps (Tamsulosin hcl) .Marland Kitchen.. 1 by mouth once qhs 21)  Lomotil 2.5-0.025 Mg Tabs (Diphenoxylate-atropine) .Marland Kitchen.. 1 by mouth as needed loose stool, max 8 tabs per 24 hrs 22)  Lovastatin 40 Mg Tabs (Lovastatin) .Marland Kitchen.. 1po once daily  Other Orders: T-Culture, Urine ([id number])  Patient Instructions: 1)  stop the felodipine and benicar at the same time when the bottles are empty 2)  start the azor sample - 1 pill per day (10/40 mg), then start the prescription after that (after the above is done) 3)  stop the lasix for now;  ONLY take on a as needed basis for swelling in the legs 4)  Drink more fluids than usual in the next 2 to 3 days as you can 5)  Please go to the Lab in the basement for your blood and/or urine tests today  6)  Your EKG was OK today 7)  Continue all previous medications as before this visit  8)  please keep your appt with Dr Tenny Craw, and then myself in May as planned Prescriptions: AZOR 10-40 MG TABS (AMLODIPINE-OLMESARTAN) 1po once daily  #30 x 11   Entered and Authorized by:   Corwin Levins MD   Signed by:   Corwin Levins MD on 06/08/2009   Method used:   Electronically to        [facility] [phone]* (retail)       [address] [place], [place]  [address]       Ph: [phone]       Fax: [phone]   RxID:   [id number]

## 2010-04-05 NOTE — Assessment & Plan Note (Signed)
Summary: B-12 / JWJ / NWS   Nurse Visit   Allergies: 1)  ! Biaxin  Medication Administration  Injection # 1:    Medication: Vit B12 1000 mcg    Diagnosis: VITAMIN B12 DEFICIENCY (ICD-266.2)    Route: IM    Site: L deltoid    Exp Date: 06/05/2011    Lot #: [id number]    Mfr: [employer]ent    Patient tolerated injection without complications    Given by: Margaret Pyle, CMA (January 19, 2010 9:34 AM)  Orders Added: 1)  Admin of Therapeutic Inj  intramuscular or subcutaneous [96372] 2)  Vit B12 1000 mcg [J3420]

## 2010-04-05 NOTE — Assessment & Plan Note (Signed)
Summary: rov for osa   Primary Provider/Referring Provider:  Oliver Barre, MD  CC:  Pt is here for a f/u appt.  Pt states he wears cpap machine every night.  Pt states he occ doesn't wear cpap machine all night d/t nausea.  Pt states only an occ mask leak.  Marland Kitchen  History of Present Illness: the pt comes in today for f/u of his known osa.  He is on bipap, and is wearing compliantly.  He is having no issues with pressure tolerance, and seems to have found a mask with a reasonable fit.  He feels he is sleeping well with the device, and has acceptable daytime alertness.  Current Medications (verified): 1)  Fenofibrate 160 Mg Tabs (Fenofibrate) .Marland Kitchen.. 1 By Mouth Qd 2)  Azor 10-40 Mg Tabs (Amlodipine-Olmesartan) .Marland Kitchen.. 1po Once Daily 3)  Furosemide 20 Mg Tabs (Furosemide) .... Take 1 By Mouth Once Daily By Mouth 5 Days Per Day (None On Wed and Sun) 4)  Levothroid 100 Mcg Tabs (Levothyroxine Sodium) .... Take 1 Tablet By Mouth Once A Day 5)  Onetouch Test  Strp (Glucose Blood) .... Use 1 Strip Once A Day - Dx Code 250.00 6)  Lansoprazole 30 Mg Cpdr (Lansoprazole) .Marland Kitchen.. 1 By Mouth Once Daily 7)  Citalopram Hydrobromide 20 Mg  Tabs (Citalopram Hydrobromide) .Marland Kitchen.. 1 By Mouth Once Daily 8)  Adult Aspirin Low Strength 81 Mg  Tbdp (Aspirin) .... Take 1 Tablet By Mouth Once A Day 9)  Alprazolam 0.25 Mg  Tabs (Alprazolam) .Marland Kitchen.. 1by Mouth  Two Times A Day As Needed - To Fill May 10, 2009 10)  Proair Hfa 108 (90 Base) Mcg/act  Aers (Albuterol Sulfate) .... 2 Puffs Q 6 Hrs As Needed 11)  Tylenol Extra Strength 500 Mg Tabs (Acetaminophen) .... As Needed 12)  Vitamin D3 1000 Unit  Tabs (Cholecalciferol) .Marland Kitchen.. 1 By Mouth Daily 13)  Cyanocobalamin 1000 Mcg/ml Soln (Cyanocobalamin) .Marland Kitchen.. 1 Cc Im Q Month 14)  Wellbutrin Xl 150 Mg Xr24h-Tab (Bupropion Hcl) .Marland Kitchen.. 1po Once Daily (Generic Ok) 15)  Creon 12000 Unit Cpep (Pancrelipase (Lip-Prot-Amyl)) .Marland Kitchen.. 1-2 Tablets By Mouth Three Times A Day With Each Meal 16)  Meclizine Hcl 25 Mg  Tabs (Meclizine Hcl) .... 1/2 - 1 By Mouth Q 6 Hrs As Needed Dizziness 17)  Metformin Hcl 500 Mg Tabs (Metformin Hcl) .Marland Kitchen.. 1 By Mouth Two Times A Day 18)  Zyprexa 5 Mg Tabs (Olanzapine) .Marland Kitchen.. 1po Once Daily 19)  Zofran 4 Mg Tabs (Ondansetron Hcl) .Marland Kitchen.. 1po Q 6 Hrs As Needed Nausea 20)  Flomax 0.4 Mg Caps (Tamsulosin Hcl) .Marland Kitchen.. 1 By Mouth Once Qhs 21)  Lomotil 2.5-0.025 Mg Tabs (Diphenoxylate-Atropine) .Marland Kitchen.. 1 By Mouth As Needed Loose Stool, Max 8 Tabs Per 24 Hrs 22)  Promethazine Hcl 25 Mg Tabs (Promethazine Hcl) .Marland Kitchen.. 1po Q 6 Hrs As Needed 23)  Walker With Seat .... Use Asd  Allergies (verified): 1)  ! Biaxin  Review of Systems       The patient complains of shortness of breath with activity.  The patient denies shortness of breath at rest, productive cough, non-productive cough, coughing up blood, chest pain, irregular heartbeats, acid heartburn, indigestion, loss of appetite, weight change, abdominal pain, difficulty swallowing, sore throat, tooth/dental problems, headaches, nasal congestion/difficulty breathing through nose, sneezing, itching, ear ache, anxiety, depression, hand/feet swelling, joint stiffness or pain, rash, change in color of mucus, and fever.    Vital Signs:  Patient profile:   77 year old male Height:  695 inches Weight:      172.25 pounds BMI:     0.25 O2 Sat:      97 % on Room air Temp:     98.0 degrees F oral Pulse rate:   57 / minute BP sitting:   132 / 60  (left arm) Cuff size:   regular  Vitals Entered By: Arman Filter LPN (September 21, 2009 10:06 AM)  O2 Flow:  Room air CC: Pt is here for a f/u appt.  Pt states he wears cpap machine every night.  Pt states he occ doesn't wear cpap machine all night d/t nausea.  Pt states only an occ mask leak.   Comments Medications reviewed with patient Arman Filter LPN  September 21, 2009 10:06 AM    Physical Exam  General:  ow male in nad Nose:  no skin breakdown or pressure necrosis from cpap mask. Extremities:  2+  edema bilat, no cyanosis Neurologic:  alert, not sleepy, moves all 4.   Impression & Recommendations:  Problem # 1:  SLEEP APNEA, OBSTRUCTIVE (ICD-327.23)  The pt is doing well on his current bipap.  He has been a very difficult fit with masks in the past, but feels his current one is doing fairly well.  He still has leaks at times, but overall is satisfied.  He is having no issues with his bipap pressure, and feels that he sleeps well with the device. I have asked him to keep working on weight loss as much as he can.  Medications Added to Medication List This Visit: 1)  Tylenol Extra Strength 500 Mg Tabs (Acetaminophen) .... As needed  Other Orders: Est. Patient Level III (16109)  Patient Instructions: 1)  no change in bipap 2)  continue to work on weight loss 3)  If doing well, followup with me in one year.  Please call if issues with machine or mask.

## 2010-04-05 NOTE — Progress Notes (Signed)
Summary: Cold?  Phone Note Call from Patient Call back at Home Phone [phone]   Caller: Spouse Summary of Call: pt spouse called to inform MD that pt has developed a cold since in for OV 06/08/2009. Pt's spouse says that she has started pt on Cloricidin Flu since yesterday. Spouse report deceased appetite and increased sleeping. Pt's spouse is requesting advisement from MD. Initial call taken by: Margaret Pyle, CMA,  June 10, 2009 10:00 AM  Follow-up for Phone Call        tylenol ok for aches and pains, coricidin for the congestion;  consider OV or urgent care or ER for worsening symptoms, such as fever , increased pain, sob, CP, weakness or falls , not taking his other meds or taking by mouth well Follow-up by: Corwin Levins MD,  June 10, 2009 12:13 PM  Additional Follow-up for Phone Call Additional follow up Details #1::        Pt has schedule appt with MD today Additional Follow-up by: Margaret Pyle, CMA,  June 10, 2009 12:39 PM

## 2010-04-05 NOTE — Assessment & Plan Note (Signed)
Summary: NAUSEA & VOMITTING/YF    History of Present Illness Visit Type: Follow-up Visit Primary GI MD: Melvia Heaps MD [facility] Primary Provider: Oliver Barre, MD Requesting Provider: n/a Chief Complaint: abdominal discomfort & constipation; no nausea or vomiting during the past month History of Present Illness:   Nathan Blackwell has returned for evaluation of vomiting.  For several months he has been complaining of episodes of dry heaves.  This may be accompanied by vomiting mucus.  He rarely vomits solid food.  He denies prior nausea.  Medications have been stable.  There is no history of pyrosis.  He takes Prevacid daily.  He is on no gastric irritants including nonsteroidals.  He has no prior upper GI complaints.  The patient is a diabetic.  He has a history of colon cancer diagnosed in 1998.  Last colonoscopy 2008 and showed a small adenomatous polyp.   GI Review of Systems    Reports abdominal pain.     Location of  Abdominal pain: LLQ.    Denies acid reflux, belching, bloating, chest pain, dysphagia with liquids, dysphagia with solids, heartburn, loss of appetite, nausea, vomiting, vomiting blood, weight loss, and  weight gain.      Reports black tarry stools, change in bowel habits, constipation, and  rectal pain.     Denies anal fissure, diarrhea, diverticulosis, fecal incontinence, heme positive stool, hemorrhoids, irritable bowel syndrome, jaundice, light color stool, liver problems, and  rectal bleeding.    Current Medications (verified): 1)  Fenofibrate 160 Mg Tabs (Fenofibrate) .Marland Kitchen.. 1 By Mouth Qd 2)  Benicar 40 Mg Tabs (Olmesartan Medoxomil) .Marland Kitchen.. 1po Once Daily 3)  Felodipine 10 Mg Tb24 (Felodipine) .... Take 1 By Mouth Qd 4)  Furosemide 20 Mg Tabs (Furosemide) .... Take 1 By Mouth Once Daily 5)  Levothroid 100 Mcg Tabs (Levothyroxine Sodium) .... Take 1 Tablet By Mouth Once A Day 6)  Lansoprazole 30 Mg Cpdr (Lansoprazole) .Marland Kitchen.. 1 By Mouth Once Daily 7)  Onetouch Test  Strp  (Glucose Blood) .... Use 1 Strip Once A Day - Dx Code 250.00 8)  Lansoprazole 30 Mg Cpdr (Lansoprazole) .Marland Kitchen.. 1 By Mouth Once Daily 9)  Citalopram Hydrobromide 20 Mg  Tabs (Citalopram Hydrobromide) .Marland Kitchen.. 1 By Mouth Once Daily 10)  Adult Aspirin Low Strength 81 Mg  Tbdp (Aspirin) .... Take 1 Tablet By Mouth Once A Day 11)  Alprazolam 0.25 Mg  Tabs (Alprazolam) .Marland Kitchen.. 1by Mouth  Two Times A Day As Needed 12)  Proair Hfa 108 (90 Base) Mcg/act  Aers (Albuterol Sulfate) .... 2 Puffs Q 6 Hrs As Needed 13)  Tylenol Extra Strength 500 Mg Tabs (Acetaminophen) .... Once Daily 14)  Vitamin D3 1000 Unit  Tabs (Cholecalciferol) .Marland Kitchen.. 1 By Mouth Daily 15)  Cyanocobalamin 1000 Mcg/ml Soln (Cyanocobalamin) .Marland Kitchen.. 1 Cc Im Q Month 16)  Wellbutrin Xl 150 Mg Xr24h-Tab (Bupropion Hcl) .Marland Kitchen.. 1po Once Daily (Generic Ok) 17)  Creon 12000 Unit Cpep (Pancrelipase (Lip-Prot-Amyl)) .Marland Kitchen.. 1-2 Tablets By Mouth Three Times A Day With Each Meal 18)  Meclizine Hcl 25 Mg Tabs (Meclizine Hcl) .... 1/2 - 1 By Mouth Q 6 Hrs As Needed Dizziness 19)  Metformin Hcl 500 Mg Tabs (Metformin Hcl) .Marland Kitchen.. 1 By Mouth Two Times A Day 20)  Zyprexa 5 Mg Tabs (Olanzapine) .Marland Kitchen.. 1po Once Daily 21)  Zofran 4 Mg Tabs (Ondansetron Hcl) .Marland Kitchen.. 1po Q 6 Hrs As Needed Nausea 22)  Flomax 0.4 Mg Caps (Tamsulosin Hcl) .Marland Kitchen.. 1 By Mouth Once Qhs 23)  Lomotil 2.5-0.025  Mg Tabs (Diphenoxylate-Atropine) .Marland Kitchen.. 1 By Mouth As Needed Loose Stool, Max 8 Tabs Per 24 Hrs  Allergies (verified): 1)  ! Biaxin  Past History:  Past Medical History: Reviewed history from 07/08/2008 and no changes required. Diabetes mellitus, type II Hyperlipidemia Hypertension Colon cancer,hx of Vocal Cord cancer Osteoporosis Benign prostatic hypertrophy OSA chronic rotater cuff syndrome Elevated PSA Hypothyroidism Nephrolithiasis thyroid nodule GERD presumed c diff colitis 11/08 Dementia Anemia-NOS B12 deficiency Hemorrhoids  Past Surgical History: Reviewed history from 07/08/2008  and no changes required. Cholecystectomy 1999 Inguinal herniorrhaphy 1984 Appendectomy 1998 Colon Resection 1998  Family History: Reviewed history from 12/09/2008 and no changes required. Family History of Alcoholism/Addiction Family History Diabetes 1st degree relative - mother at 66 yrs Family History of Stroke M 1st degree relative  - father died 73 yrs No FH of Colon Cancer:  Social History: Reviewed history from 09/26/2007 and no changes required. 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  The patient denies allergy/sinus, anxiety-new, blood in urine, breast changes/lumps, change in vision, confusion, coughing up blood, depression-new, fainting, fever, heart murmur, heart rhythm changes, itching, menstrual pain, muscle pains/cramps, night sweats, nosebleeds, pregnancy symptoms, skin rash, sore throat, swollen lymph glands, thirst - excessive , urination - excessive , urination changes/pain, urine leakage, and vision changes.    Vital Signs:  Patient profile:   77 year old male Height:      69 inches Weight:      156.13 pounds Pulse rate:   68 / minute Pulse rhythm:   irregular BP sitting:   144 / 60  (left arm) Cuff size:   regular  Vitals Entered By: June McMurray CMA Duncan Dull) (May 06, 2009 11:26 AM)  Physical Exam  Additional Exam:  On physical exam he is an elderly male  skin: anicteric HEENT: normocephalic; PEERLA; no nasal or pharyngeal abnormalities neck: supple nodes: no cervical lymphadenopathy chest: clear to ausculatation and percussion heart: no murmurs, gallops, or rubs abd: soft, nontender; BS normoactive; no abdominal masses, tenderness, organomegaly; abdomen is slightly distended.  There is no  succussion splash rectal: deferred ext: no cynanosis, clubbing, edema skeletal: no deformities neuro: oriented x 3; no focal abnormalities    Impression & Recommendations:  Problem # 1:  NAUSEA WITH VOMITING (ICD-787.01) Assessment Deteriorated  Symptoms continue characterized by spontaneous episodes of vomiting that may occur up to twice a week.  There is no specific pattern.  Etiologies may include ulcer or nonulcer dyspepsia, or gastroparesis.  Recommendations #1 upper endoscopy #2 to consider gastric emptying scan if endoscopy is not diagnostic  Risks, alternatives, and complications of the procedure, including bleeding, perforation, and possible need for surgery, were explained to the patient.  Patient's questions were answered.  Orders: EGD (EGD)  Problem # 2:  COLON CANCER, HX OF (ICD-V10.05) Given patient's advanced age and mild dementia no further surveillance will be conducted.  Problem # 3:  DIABETES MELLITUS, TYPE II (ICD-250.00) Assessment: Comment Only  Patient Instructions: 1)  Conscious Sedation brochure given.  2)  Upper Endoscopy brochure given.  3)  Your EGD is scheduled for 05/12/2009 at 3:30pm 4)  You have been instructed on your diabetic meds 5)  The medication list was reviewed and reconciled.  All changed / newly prescribed medications were  explained.  A complete medication list was provided to the patient / caregiver.

## 2010-04-05 NOTE — Assessment & Plan Note (Signed)
Summary: b12/Selena Swaminathan/cd   Nurse Visit   Allergies: 1)  ! Biaxin  Medication Administration  Injection # 1:    Medication: Vit B12 1000 mcg    Diagnosis: VITAMIN B12 DEFICIENCY (ICD-266.2)    Route: IM    Site: L deltoid    Exp Date: 06/05/2011    Lot #: [id number]    Mfr: American Regent  Orders Added: 1)  Admin of Therapeutic Inj  intramuscular or subcutaneous [96372] 2)  Vit B12 1000 mcg [J3420]
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
## 2010-04-05 NOTE — Progress Notes (Signed)
Summary: Nuclear Pre-Procedure  Phone Note Outgoing Call Call back at [facility] Phone [phone]   Call placed by: Stanton Kidney, EMT-P,  Jul 29, 2009 1:19 PM Action Taken: Phone Call Completed Summary of Call: Unable to leave message with information on [employer] Information Sheet (see scanned document for details); No answer/no machine.    Nuclear Med Background Indications for Stress Test: Evaluation for Ischemia   History: COPD, Echo, Myocardial Perfusion Study, Pacemaker  History Comments: '07 MPS: EF=65%, low risk, small amount inf. ischemia 6/07 Echo: EF=55-60%  Symptoms: DOE    Nuclear Pre-Procedure Cardiac Risk Factors: Carotid Disease, Hypertension, Lipids, NIDDM, PVD Height (in): 69

## 2010-04-05 NOTE — Progress Notes (Signed)
Summary: verbal order  Phone Note Other Incoming   Caller: PT with [facility] [phone] Summary of Call: PT called requesting verbal okay to visit with pt 2 x a week for 1st week and 3 x a week for the next 3 weeks to work on Gait, balance and range of motion. verbal okay. Initial call taken by: Margaret Pyle, CMA,  April 09, 2009 3:44 PM  Follow-up for Phone Call        verbal ok Follow-up by: Corwin Levins MD,  April 09, 2009 3:52 PM  Additional Follow-up for Phone Call Additional follow up Details #1::        PT given verbal okay via VM, told ot call back with any further questions or concerns Additional Follow-up by: Margaret Pyle, CMA,  April 09, 2009 4:05 PM

## 2010-04-05 NOTE — Miscellaneous (Signed)
Summary: Order/[facility]  Order/[facility]   Imported By: Lester  05/13/2009 08:08:57  _____________________________________________________________________  External Attachment:    Type:   Image     Comment:   External Document

## 2010-04-05 NOTE — Miscellaneous (Signed)
Summary: Medication Issue Communication/[facility]  Medication Issue Communication/[facility]   Imported By: Sherian Rein 04/19/2009 08:56:35  _____________________________________________________________________  External Attachment:    Type:   Image     Comment:   External Document

## 2010-04-05 NOTE — Assessment & Plan Note (Signed)
Summary: b12/[facility]/cd   Nurse Visit   Allergies: 1)  ! Biaxin  Medication Administration  Injection # 1:    Medication: Vit B12 1000 mcg    Diagnosis: VITAMIN B12 DEFICIENCY (ICD-266.2)    Route: IM    Site: L deltoid    Exp Date: 11/05/2010    Lot #: [id number]    Mfr: [employer] Regent    Patient tolerated injection without complications    Given by: Sydell Axon (May 11, 2009 2:24 PM)  Orders Added: 1)  Vit B12 1000 mcg [J3420] 2)  Admin of Therapeutic Inj  intramuscular or subcutaneous [[id number]]   Medication Administration  Injection # 1:    Medication: Vit B12 1000 mcg    Diagnosis: VITAMIN B12 DEFICIENCY (ICD-266.2)    Route: IM    Site: L deltoid    Exp Date: 11/05/2010    Lot #: [id number]    Mfr: [employer] Regent    Patient tolerated injection without complications    Given by: Sydell Axon (May 11, 2009 2:24 PM)  Orders Added: 1)  Vit B12 1000 mcg [J3420] 2)  Admin of Therapeutic Inj  intramuscular or subcutaneous [[id number]]

## 2010-04-05 NOTE — Progress Notes (Signed)
  Phone Note Other Incoming   Caller: Patients wife, Chiron Campione Summary of Call: Patients wife called requesting clarification on how to take Lasix. Informed patient's wife Lasix is to be taken on Monday, wednesday and friday. Patient has stopped throwing up. He did eat a liitle diner last night. He has had just a liquid breakfast this morning. Initial call taken by: Scharlene Gloss,  June 11, 2009 10:10 AM  Follow-up for Phone Call        ok to take lasix as above, instead of as needed to avoid confusion Follow-up by: Corwin Levins MD,  June 11, 2009 1:15 PM    New/Updated Medications: FUROSEMIDE 20 MG TABS (FUROSEMIDE) Take 1 by mouth once daily by mouth Mon-Wed-Fri only

## 2010-04-05 NOTE — Progress Notes (Signed)
Summary: Med Refill  Phone Note Refill Request  on May 10, 2009 11:54 AM  Refills Requested: Medication #1:  ALPRAZOLAM 0.25 MG  TABS 1by mouth  two times a day as needed   Dosage confirmed as above?Dosage Confirmed   Notes: [facility], [phone] Initial call taken by: Scharlene Gloss,  May 10, 2009 11:55 AM  Follow-up for Phone Call        done hardcopy to LIM side B - dahlia  Follow-up by: Corwin Levins MD,  May 10, 2009 1:13 PM  Additional Follow-up for Phone Call Additional follow up Details #1::        faxed. Lucious Groves  May 10, 2009 1:47 PM     New/Updated Medications: ALPRAZOLAM 0.25 MG  TABS (ALPRAZOLAM) 1by mouth  two times a day as needed - to fill May 10, 2009 Prescriptions: ALPRAZOLAM 0.25 MG  TABS (ALPRAZOLAM) 1by mouth  two times a day as needed - to fill May 10, 2009  #60 x 2   Entered and Authorized by:   Corwin Levins MD   Signed by:   Corwin Levins MD on 05/10/2009   Method used:   Print then Give to Patient   RxID:   [id number]

## 2010-04-05 NOTE — Assessment & Plan Note (Signed)
Summary: (3:45pm appt)  F/U FROM ENDO. & UGI               DEBORAH      Allergies Added:   History of Present Illness Visit Type: Follow-up Visit Primary GI MD: Melvia Heaps MD [facility] Primary Provider: Oliver Barre, MD Requesting Provider: n/a Chief Complaint: f/u EGD UGI History of Present Illness:   Mr. Strothers has returned following upper endoscopy and upper GI series.  At upper endoscopy a distal esophageal stricture was seen.  In addition, there appeared to be a paraesophageal hiatal hernia.  This exam was followed up by an upper GI series, which I reviewed, which demonstrated significant reflux up to the level of the cervical esophagus.  A small moderate mixed hiatal hernia and paraesophageal gastric hernia was also demonstrated.  Gastric emptying was normal.  Mr. Senger continues to suffer from episodes of vomiting.   GI Review of Systems      Denies abdominal pain, acid reflux, belching, bloating, chest pain, dysphagia with liquids, dysphagia with solids, heartburn, loss of appetite, nausea, vomiting, vomiting blood, weight loss, and  weight gain.      Reports constipation.     Denies anal fissure, black tarry stools, change in bowel habit, diarrhea, diverticulosis, fecal incontinence, heme positive stool, hemorrhoids, irritable bowel syndrome, jaundice, light color stool, liver problems, rectal bleeding, and  rectal pain.    Current Medications (verified): 1)  Fenofibrate 160 Mg Tabs (Fenofibrate) .Marland Kitchen.. 1 By Mouth Qd 2)  Benicar 40 Mg Tabs (Olmesartan Medoxomil) .Marland Kitchen.. 1po Once Daily 3)  Felodipine 10 Mg Tb24 (Felodipine) .... Take 1 By Mouth Qd 4)  Furosemide 20 Mg Tabs (Furosemide) .... Take 1 By Mouth Once Daily 5)  Levothroid 100 Mcg Tabs (Levothyroxine Sodium) .... Take 1 Tablet By Mouth Once A Day 6)  Lansoprazole 30 Mg Cpdr (Lansoprazole) .Marland Kitchen.. 1 By Mouth Once Daily 7)  Onetouch Test  Strp (Glucose Blood) .... Use 1 Strip Once A Day - Dx Code 250.00 8)  Lansoprazole 30 Mg Cpdr  (Lansoprazole) .Marland Kitchen.. 1 By Mouth Once Daily 9)  Citalopram Hydrobromide 20 Mg  Tabs (Citalopram Hydrobromide) .Marland Kitchen.. 1 By Mouth Once Daily 10)  Adult Aspirin Low Strength 81 Mg  Tbdp (Aspirin) .... Take 1 Tablet By Mouth Once A Day 11)  Alprazolam 0.25 Mg  Tabs (Alprazolam) .Marland Kitchen.. 1by Mouth  Two Times A Day As Needed - To Fill May 10, 2009 12)  Proair Hfa 108 (90 Base) Mcg/act  Aers (Albuterol Sulfate) .... 2 Puffs Q 6 Hrs As Needed 13)  Tylenol Extra Strength 500 Mg Tabs (Acetaminophen) .... Once Daily 14)  Vitamin D3 1000 Unit  Tabs (Cholecalciferol) .Marland Kitchen.. 1 By Mouth Daily 15)  Cyanocobalamin 1000 Mcg/ml Soln (Cyanocobalamin) .Marland Kitchen.. 1 Cc Im Q Month 16)  Wellbutrin Xl 150 Mg Xr24h-Tab (Bupropion Hcl) .Marland Kitchen.. 1po Once Daily (Generic Ok) 17)  Creon 12000 Unit Cpep (Pancrelipase (Lip-Prot-Amyl)) .Marland Kitchen.. 1-2 Tablets By Mouth Three Times A Day With Each Meal 18)  Meclizine Hcl 25 Mg Tabs (Meclizine Hcl) .... 1/2 - 1 By Mouth Q 6 Hrs As Needed Dizziness 19)  Metformin Hcl 500 Mg Tabs (Metformin Hcl) .Marland Kitchen.. 1 By Mouth Two Times A Day 20)  Zyprexa 5 Mg Tabs (Olanzapine) .Marland Kitchen.. 1po Once Daily 21)  Zofran 4 Mg Tabs (Ondansetron Hcl) .Marland Kitchen.. 1po Q 6 Hrs As Needed Nausea 22)  Flomax 0.4 Mg Caps (Tamsulosin Hcl) .Marland Kitchen.. 1 By Mouth Once Qhs 23)  Lomotil 2.5-0.025 Mg Tabs (Diphenoxylate-Atropine) .Marland KitchenMarland KitchenMarland Kitchen 1  By Mouth As Needed Loose Stool, Max 8 Tabs Per 24 Hrs  Allergies (verified): 1)  ! Biaxin  Past History:  Past Medical History: Reviewed history from 07/08/2008 and no changes required. Diabetes mellitus, type II Hyperlipidemia Hypertension Colon cancer,hx of Vocal Cord cancer Osteoporosis Benign prostatic hypertrophy OSA chronic rotater cuff syndrome Elevated PSA Hypothyroidism Nephrolithiasis thyroid nodule GERD presumed c diff colitis 11/08 Dementia Anemia-NOS B12 deficiency Hemorrhoids  Past Surgical History: Reviewed history from 07/08/2008 and no changes required. Cholecystectomy 1999 Inguinal  herniorrhaphy 1984 Appendectomy 1998 Colon Resection 1998  Family History: Reviewed history from 12/09/2008 and no changes required. Family History of Alcoholism/Addiction Family History Diabetes 1st degree relative - mother at 67 yrs Family History of Stroke M 1st degree relative  - father died 40 yrs No FH of Colon Cancer:  Social History: Reviewed history from 09/26/2007 and no changes required. 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  Review of Systems  The patient denies allergy/sinus, anemia, anxiety-new, arthritis/joint pain, back pain, blood in urine, breast changes/lumps, change in vision, confusion, cough, coughing up blood, depression-new, fainting, fatigue, fever, headaches-new, hearing problems, heart murmur, heart rhythm changes, itching, menstrual pain, muscle pains/cramps, night sweats, nosebleeds, pregnancy symptoms, shortness of breath, skin rash, sleeping problems, sore throat, swelling of feet/legs, swollen lymph glands, thirst - excessive , urination - excessive , urination changes/pain, urine leakage, vision changes, and voice change.    Vital Signs:  Patient profile:   77 year old male Height:      69 inches Weight:      158.50 pounds Pulse rate:   68 / minute Pulse rhythm:   regular BP sitting:   130 / 62  (left arm)  Vitals Entered By: Chales Abrahams CMA Duncan Dull) (May 17, 2009 3:31 PM)   Impression & Recommendations:  Problem # 1:  NAUSEA WITH VOMITING (ICD-787.01) Symptoms are likely due to free  acid reflux.  There does not appear to be evidence for gastroparesis.  In addition, the patient has a paraesophageal hiatal hernia which may be contributing to his symptoms.  Recommendations #1 the patient will likely benefit from a fundoplication.  In addition, he is at risk for incarcerating his stomach from his paraesophageal hiatal hernia #2 continue Prevacid  Problem # 2:  DIABETES MELLITUS, TYPE II  (ICD-250.00) Assessment: Comment Only  Patient Instructions: 1)  CC Dr. Ovidio Kin 2)  We will contact you when we have an appointment with Dr Ezzard Standing 3)  continue Prevacid 4)  The medication list was reviewed and reconciled.  All changed / newly prescribed medications were explained.  A complete medication list was provided to the patient / caregiver.

## 2010-04-05 NOTE — Assessment & Plan Note (Signed)
Summary: 1 MO ROV / /NWS   Vital Signs:  Patient profile:   77 year old [place] Height:      69.5 inches Weight:      143 pounds O2 Sat:      98 % on Room air Temp:     97 degrees F oral Pulse rate:   60 / minute BP sitting:   132 / 70  (left arm) Cuff size:   regular  Vitals Entered ByZella Ball Ewing (May 18, 2009 1:31 PM)  O2 Flow:  Room air  Preventive Care Screening     declines tetanus  CC: 1 Mo Followup/RE   Primary Care Provider:  Oliver Barre, MD  CC:  1 Mo Followup/RE.  History of Present Illness: wife ill today at home with cold;  drove himself here;  just finished PT coursel no further UTI symtpoms;  Pt denies CP, or worsening sob, doe, wheezing, orthopnea, pnd, worsening LE edema, palps, dizziness or syncope ;  due for anemia f/u;  also has referral to ENT per dr Arlyce Dice. Pt denies new neuro symptoms such as headache, facial or extremity weakness Pt denies polydipsia, polyuria, or low sugar symptoms such as shakiness improved with eating.  Overall good compliance with meds, trying to follow low chol, DM diet, wt stable, little excercise however   Here for wellness Diet: Heart Healthy or DM if diabetic Physical Activities: Sedentary Depression/mood screen: Negative Hearing: mod to severe loss bilat Visual Acuity: Grossly normal , wears glasses ADL's: Capable  Fall Risk: Mild to mod Home Safety: Good End-of-Life Planning: Advance directive - Full code/I d/w pt he might re-consider  Problems Prior to Update: 1)  Fatigue  (ICD-780.79) 2)  Preventive Health Care  (ICD-V70.0) 3)  Hip Pain, Right  (ICD-719.45) 4)  Pneumonia, Right Upper Lobe  (ICD-486) 5)  Uti  (ICD-599.0) 6)  Fever Unspecified  (ICD-780.60) 7)  Uti  (ICD-599.0) 8)  Dementia  (ICD-294.8) 9)  Hyperlipidemia  (ICD-272.4) 10)  Benign Prostatic Hypertrophy  (ICD-600.00) 11)  Hypertension  (ICD-401.9) 12)  Diabetes Mellitus, Type II  (ICD-250.00) 13)  Accidental Falls, Recurrent  (ICD-E888.9) 14)   Thrush  (ICD-112.0) 15)  Tinnitus  (ICD-388.30) 16)  Hypokalemia  (ICD-276.8) 17)  Vitamin B12 Deficiency  (ICD-266.2) 18)  Loss of Weight  (ICD-783.21) 19)  Anemia-nos  (ICD-285.9) 20)  Nausea With Vomiting  (ICD-787.01) 21)  B12 Deficiency  (ICD-266.2) 22)  Paresthesia  (ICD-782.0) 23)  Gait Disturbance  (ICD-781.2) 24)  Back Pain, Thoracic Region  (ICD-724.1) 25)  Preventive Health Care  (ICD-V70.0) 26)  Hypersomnia  (ICD-780.54) 27)  Peripheral Edema  (ICD-782.3) 28)  Olecranon Bursitis, Right  (ICD-726.33) 29)  Bursitis, Right Knee  (ICD-726.60) 30)  Abnormality of Gait  (ICD-781.2) 31)  Depression  (ICD-311) 32)  Arthritis  (ICD-716.90) 33)  Dizziness  (ICD-780.4) 34)  Hx of Clostridium Difficile Colitis  (ICD-008.45) 35)  Gerd  (ICD-530.81) 36)  Thyroid Nodule  (ICD-241.0) 37)  Nephrolithiasis, Hx of  (ICD-V13.01) 38)  Hypothyroidism  (ICD-244.9) 39)  Psa, Increased  (ICD-790.93) Nathan)  Rotator Cuff Syndrome, Left  (ICD-726.10) 41)  Sleep Apnea, Obstructive  (ICD-327.23) 42)  Psa, Increased  (ICD-790.93) 43)  Family History Diabetes 1st Degree Relative  (ICD-V18.0) 44)  Family History of Alcoholism/addiction  (ICD-V61.41) 45)  Neop, Malignant, 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 1 By Mouth Qd 2)  Benicar Nathan Mg Tabs (Olmesartan Medoxomil) .Marland Kitchen.. 1po Once Daily 3)  Felodipine 10 Mg Tb24 (Felodipine) .... Take 1 By Mouth Qd 4)  Furosemide 20 Mg Tabs (Furosemide) .... Take 1 By Mouth Once Daily 5)  Levothroid 100 Mcg Tabs (Levothyroxine Sodium) .... Take 1 Tablet By Mouth Once A Day 6)  Lansoprazole 30 Mg Cpdr (Lansoprazole) .Marland Kitchen.. 1 By Mouth Once Daily 7)  Onetouch Test  Strp (Glucose Blood) .... Use 1 Strip Once A Day - Dx Code 250.00 8)  Lansoprazole 30 Mg Cpdr (Lansoprazole) .Marland Kitchen.. 1 By Mouth Once  Daily 9)  Citalopram Hydrobromide 20 Mg  Tabs (Citalopram Hydrobromide) .Marland Kitchen.. 1 By Mouth Once Daily 10)  Adult Aspirin Low Strength 81 Mg  Tbdp (Aspirin) .... Take 1 Tablet By Mouth Once A Day 11)  Alprazolam 0.25 Mg  Tabs (Alprazolam) .Marland Kitchen.. 1by Mouth  Two Times A Day As Needed - To Fill May 10, 2009 12)  Proair Hfa 108 (90 Base) Mcg/act  Aers (Albuterol Sulfate) .... 2 Puffs Q 6 Hrs As Needed 13)  Tylenol Extra Strength 500 Mg Tabs (Acetaminophen) .... Once Daily 14)  Vitamin D3 1000 Unit  Tabs (Cholecalciferol) .Marland Kitchen.. 1 By Mouth Daily 15)  Cyanocobalamin 1000 Mcg/ml Soln (Cyanocobalamin) .Marland Kitchen.. 1 Cc Im Q Month 16)  Wellbutrin Xl 150 Mg Xr24h-Tab (Bupropion Hcl) .Marland Kitchen.. 1po Once Daily (Generic Ok) 17)  Creon 12000 Unit Cpep (Pancrelipase (Lip-Prot-Amyl)) .Marland Kitchen.. 1-2 Tablets By Mouth Three Times A Day With Each Meal 18)  Meclizine Hcl 25 Mg Tabs (Meclizine Hcl) .... 1/2 - 1 By Mouth Q 6 Hrs As Needed Dizziness 19)  Metformin Hcl 500 Mg Tabs (Metformin Hcl) .Marland Kitchen.. 1 By Mouth Two Times A Day 20)  Zyprexa 5 Mg Tabs (Olanzapine) .Marland Kitchen.. 1po Once Daily 21)  Zofran 4 Mg Tabs (Ondansetron Hcl) .Marland Kitchen.. 1po Q 6 Hrs As Needed Nausea 22)  Flomax 0.4 Mg Caps (Tamsulosin Hcl) .Marland Kitchen.. 1 By Mouth Once Qhs 23)  Lomotil 2.5-0.025 Mg Tabs (Diphenoxylate-Atropine) .Marland Kitchen.. 1 By Mouth As Needed Loose Stool, Max 8 Tabs Per 24 Hrs  Current Medications (verified): 1)  Fenofibrate 160 Mg Tabs (Fenofibrate) .Marland Kitchen.. 1 By Mouth Qd 2)  Benicar Nathan Mg Tabs (Olmesartan Medoxomil) .Marland Kitchen.. 1po Once Daily 3)  Felodipine 10 Mg Tb24 (Felodipine) .... Take 1 By Mouth Qd 4)  Furosemide 20 Mg Tabs (Furosemide) .... Take 1 By Mouth Once Daily 5)  Levothroid 100 Mcg Tabs (Levothyroxine Sodium) .... Take 1 Tablet By Mouth Once A Day 6)  Lansoprazole 30 Mg Cpdr (Lansoprazole) .Marland Kitchen.. 1 By Mouth Once Daily 7)  Onetouch Test  Strp (Glucose Blood) .... Use 1 Strip Once A Day - Dx Code 250.00 8)  Lansoprazole 30 Mg Cpdr (Lansoprazole) .Marland Kitchen.. 1 By Mouth Once Daily 9)   Citalopram Hydrobromide 20 Mg  Tabs (Citalopram Hydrobromide) .Marland Kitchen.. 1 By Mouth Once Daily 10)  Adult Aspirin Low Strength 81 Mg  Tbdp (Aspirin) .... Take 1 Tablet By Mouth Once A Day 11)  Alprazolam 0.25 Mg  Tabs (Alprazolam) .Marland Kitchen.. 1by Mouth  Two Times A Day As Needed - To Fill May 10, 2009 12)  Proair Hfa 108 (90 Base) Mcg/act  Aers (Albuterol Sulfate) .... 2 Puffs Q 6 Hrs As Needed 13)  Tylenol Extra Strength 500 Mg Tabs (Acetaminophen) .... Once Daily 14)  Vitamin D3 1000 Unit  Tabs (Cholecalciferol) .Marland Kitchen.. 1 By Mouth Daily 15)  Cyanocobalamin 1000 Mcg/ml Soln (Cyanocobalamin) .Marland Kitchen.. 1 Cc Im  Q Month 16)  Wellbutrin Xl 150 Mg Xr24h-Tab (Bupropion Hcl) .Marland Kitchen.. 1po Once Daily (Generic Ok) 17)  Creon 12000 Unit Cpep (Pancrelipase (Lip-Prot-Amyl)) .Marland Kitchen.. 1-2 Tablets By Mouth Three Times A Day With Each Meal 18)  Meclizine Hcl 25 Mg Tabs (Meclizine Hcl) .... 1/2 - 1 By Mouth Q 6 Hrs As Needed Dizziness 19)  Metformin Hcl 500 Mg Tabs (Metformin Hcl) .Marland Kitchen.. 1 By Mouth Two Times A Day 20)  Zyprexa 5 Mg Tabs (Olanzapine) .Marland Kitchen.. 1po Once Daily 21)  Zofran 4 Mg Tabs (Ondansetron Hcl) .Marland Kitchen.. 1po Q 6 Hrs As Needed Nausea 22)  Flomax 0.4 Mg Caps (Tamsulosin Hcl) .Marland Kitchen.. 1 By Mouth Once Qhs 23)  Lomotil 2.5-0.025 Mg Tabs (Diphenoxylate-Atropine) .Marland Kitchen.. 1 By Mouth As Needed Loose Stool, Max 8 Tabs Per 24 Hrs 24)  Lovastatin Nathan Mg Tabs (Lovastatin) .Marland Kitchen.. 1po Once Daily  Allergies (verified): 1)  ! Biaxin  Past History:  Past Medical History: Last updated: 07/08/2008 Diabetes mellitus, type II Hyperlipidemia Hypertension Colon cancer,hx of Vocal Cord cancer Osteoporosis Benign prostatic hypertrophy OSA chronic rotater cuff syndrome Elevated PSA Hypothyroidism Nephrolithiasis thyroid nodule GERD presumed c diff colitis 11/08 Dementia Anemia-NOS B12 deficiency Hemorrhoids  Past Surgical History: Last updated: 07/08/2008 Cholecystectomy 1999 Inguinal herniorrhaphy 1984 Appendectomy 1998 Colon Resection  1998  Family History: Last updated: 12/09/2008 Family History of Alcoholism/Addiction Family History Diabetes 1st degree relative - mother at 44 yrs Family History of Stroke M 1st degree relative  - father died 82 yrs No FH of Colon Cancer:  Social History: Last updated: 09/26/2007 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         all otherwise negative per pt -  except for some mild ongoing fatigue, without osa symtpoms  Physical Exam  General:  alert and overweight-appearing.   Head:  normocephalic and atraumatic.   Eyes:  vision grossly intact, pupils equal, and pupils round.   Ears:  R ear normal and L ear normal.   Nose:  no external deformity and no nasal discharge.   Mouth:  no gingival abnormalities and pharynx pink and moist.   Neck:  supple and no masses.   Lungs:  normal respiratory effort, R decreased breath sounds, and L decreased breath sounds.   Heart:  normal rate and regular rhythm.   Abdomen:  soft, non-tender, and normal bowel sounds.   Extremities:  no edema, no erythema  Neurologic:  alert & oriented X3 and cranial nerves II-XII intact.     Impression & Recommendations:  Problem # 1:  Preventive Health Care (ICD-V70.0)  Overall doing well, age appropriate education and counseling updated and referral for appropriate preventive services done unless declined, immunizations up to date or declined, diet counseling done if overweight, urged to quit smoking if smokes ,  most recent labs reviewed and current ordered if appropriate, ecg reviewed or declined (interpretation per ECG scanned in the EMR if done); information regarding Medicare Prevention requirements given if appropriate   Orders: First annual wellness visit with prevention plan  (Z6109)  Problem # 2:  HYPERTENSION (ICD-401.9)  His updated medication list for this problem includes:    Benicar  Nathan Mg Tabs (Olmesartan medoxomil) .Marland Kitchen... 1po once daily    Felodipine 10 Mg Tb24 (Felodipine) .Marland Kitchen... Take 1 by mouth qd    Furosemide 20 Mg Tabs (Furosemide) .Marland Kitchen... Take 1 by mouth once daily  BP today: 132/70 Prior BP: 130/62 (05/17/2009)  Labs Reviewed: K+: 3.8 (01/25/2009) Creat: : 1.6 (01/25/2009)   Chol: 142 (06/23/2008)   HDL: 48.30 (06/23/2008)   LDL: 65 (06/23/2008)   TG: 143.0 (06/23/2008) stable overall by hx and exam, ok to continue meds/tx as is   Problem # 3:  DIABETES MELLITUS, TYPE II (ICD-250.00)  His updated medication list for this problem includes:    Benicar Nathan Mg Tabs (Olmesartan medoxomil) .Marland Kitchen... 1po once daily    Adult Aspirin Low Strength 81 Mg Tbdp (Aspirin) .Marland Kitchen... Take 1 tablet by mouth once a day    Metformin Hcl 500 Mg Tabs (Metformin hcl) .Marland Kitchen... 1 by mouth two times a day  Labs Reviewed: Creat: 1.6 (01/25/2009)    Reviewed HgBA1c results: 6.6 (06/23/2008)  6.8 (01/21/2008)  Orders: TLB-Lipid Panel (80061-LIPID) TLB-Microalbumin/Creat Ratio, Urine (82043-MALB) TLB-A1C / Hgb A1C (Glycohemoglobin) (83036-A1C) stable overall by hx and exam, ok to continue meds/tx as is   Problem # 4:  HYPERLIPIDEMIA (ICD-272.4)  His updated medication list for this problem includes:    Fenofibrate 160 Mg Tabs (Fenofibrate) .Marland Kitchen... 1 by mouth qd    Lovastatin Nathan Mg Tabs (Lovastatin) .Marland Kitchen... 1po once daily  Labs Reviewed: SGOT: 18 (09/14/2008)   SGPT: 11 (09/14/2008)   HDL:48.30 (06/23/2008), 39.7 (01/21/2008)  LDL:65 (06/23/2008), 90 (01/21/2008)  Chol:142 (06/23/2008), 151 (01/21/2008)   Trig:143.0 (06/23/2008), 107 (01/21/2008) ok to restart the lovastatin as not likely now related to GI issue  Orders: Prescription Created Electronically [phone])  Problem # 5:  FATIGUE (ICD-780.79)  exam benign, to check labs below; follow with expectant management   Orders: T-Vitamin D (25-Hydroxy) (14782-95621) TLB-BMP (Basic Metabolic Panel-BMET) (80048-METABOL) TLB-CBC Platelet - w/Differential (85025-CBCD) TLB-Hepatic/Liver Function Pnl (80076-HEPATIC) TLB-TSH (Thyroid Stimulating Hormone) (84443-TSH) TLB-Sedimentation Rate (ESR) (85652-ESR)  Problem # 6:  ANEMIA-NOS (ICD-285.9)  His updated medication list for this problem includes:    Cyanocobalamin 1000 Mcg/ml Soln (Cyanocobalamin) .Marland Kitchen... 1 cc im q month to re-check labs  Orders: TLB-IBC Pnl (Iron/FE;Transferrin) (83550-IBC) TLB-B12 + Folate Pnl (30865_78469-G29/BMW)  Complete Medication List: 1)  Fenofibrate 160 Mg Tabs (Fenofibrate) .Marland Kitchen.. 1 by mouth qd 2)  Benicar Nathan Mg Tabs (Olmesartan medoxomil) .Marland Kitchen.. 1po once daily 3)  Felodipine 10 Mg Tb24 (Felodipine) .... Take 1 by mouth qd 4)  Furosemide 20 Mg Tabs (Furosemide) .... Take 1 by mouth once daily 5)  Levothroid 100 Mcg Tabs (Levothyroxine sodium) .... Take 1 tablet by mouth once a day 6)  Lansoprazole 30 Mg Cpdr (Lansoprazole) .Marland Kitchen.. 1 by mouth once daily 7)  Onetouch Test Strp (Glucose blood) .... Use 1 strip once a day - dx code 250.00 8)  Lansoprazole 30 Mg Cpdr (Lansoprazole) .Marland Kitchen.. 1 by mouth once daily 9)  Citalopram Hydrobromide 20 Mg Tabs (Citalopram hydrobromide) .Marland Kitchen.. 1 by mouth once daily 10)  Adult Aspirin Low Strength 81 Mg Tbdp (Aspirin) .... Take 1 tablet by mouth once a day 11)  Alprazolam 0.25 Mg Tabs (Alprazolam) .Marland Kitchen.. 1by mouth  two times a day as needed - to fill May 10, 2009 12)  Proair Hfa 108 (90 Base) Mcg/act Aers (Albuterol sulfate) .... 2 puffs q 6 hrs as needed 13)  Tylenol Extra Strength 500 Mg Tabs (Acetaminophen) .... Once daily 14)  Vitamin  D3 1000 Unit  Tabs (Cholecalciferol) .Marland Kitchen.. 1 by mouth daily 15)  Cyanocobalamin 1000 Mcg/ml Soln (Cyanocobalamin) .Marland Kitchen.. 1 cc im q month 16)  Wellbutrin Xl 150 Mg Xr24h-tab (Bupropion hcl) .Marland Kitchen.. 1po once daily (generic ok) 17)  Creon 12000 Unit Cpep (Pancrelipase (lip-prot-amyl)) .Marland Kitchen.. 1-2 tablets by mouth three times a day with each meal 18)  Meclizine Hcl 25 Mg Tabs (Meclizine hcl) .... 1/2 - 1 by mouth q 6 hrs as needed dizziness 19)  Metformin Hcl 500 Mg Tabs (Metformin hcl) .Marland Kitchen.. 1 by mouth two times a day 20)  Zyprexa 5 Mg Tabs (Olanzapine) .Marland Kitchen.. 1po once daily 21)  Zofran 4 Mg Tabs (Ondansetron hcl) .Marland Kitchen.. 1po q 6 hrs as needed nausea 22)  Flomax 0.4 Mg Caps (Tamsulosin hcl) .Marland Kitchen.. 1 by mouth once qhs 23)  Lomotil 2.5-0.025 Mg Tabs (Diphenoxylate-atropine) .Marland Kitchen.. 1 by mouth as needed loose stool, max 8 tabs per 24 hrs 24)  Lovastatin Nathan Mg Tabs (Lovastatin) .Marland Kitchen.. 1po once daily  Patient Instructions: 1)  re-start the lovastatin at Nathan mg per day (this was sent to your pharmacy) 2)  Continue all previous medications as before this visit  3)  Please go to the Lab in the basement for your blood and/or urine tests today 4)  Please keep your appt with ENT as you have planned 5)  Please schedule a follow-up appointment in 6 months. Prescriptions: LOVASTATIN Nathan MG TABS (LOVASTATIN) 1po once daily  #90 x 3   Entered and Authorized by:   Corwin Levins MD   Signed by:   Corwin Levins MD on 05/18/2009   Method used:   Electronically to        [facility] [phone]* (retail)       [address] [place], [place]  [address]       Ph: [phone]       Fax: [phone]   RxID:   [id number]
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
## 2010-04-05 NOTE — Progress Notes (Signed)
Summary: UGI, GES AND REV SCHEDULED   Phone Note Outgoing Call   Call placed by: Laureen Ochs LPN,  May 13, 2009 11:38 AM Call placed to: Patient Summary of Call: Pt. is scheduled for an UGI at [facility] on 05-14-09 at 9am (NPO after 12mn), GES at [facility] on 05-25-09 at 9am (NPO after 12mn) and an REV with Dr.Nikelle Malatesta on 05-28-09 at 2:30pm. All appt. information and instructions given to Mrs.Mcever by phone. She will callback as needed. Initial call taken by: Laureen Ochs LPN,  May 13, 2009 11:39 AM

## 2010-04-05 NOTE — Assessment & Plan Note (Signed)
Summary: HOARSE,COUGH,/CD   Vital Signs:  Patient profile:   77 year old male Height:      68 inches Weight:      158.13 pounds O2 Sat:      98 % on Room air Temp:     97.5 degrees F oral Pulse rate:   64 / minute BP sitting:   142 / 60  (left arm) Cuff size:   regular  Vitals Entered ByZella Ball Ewing (June 28, 2009 3:57 PM)  O2 Flow:  Room air CC: SOB, cough, congestion, Hoarse/RE   Primary Care Provider:  Oliver Barre, MD  CC:  SOB, cough, congestion, and Hoarse/RE.  History of Present Illness: here with CHRONIC hoarseness and cough no change for many months;  acutally here with SOB/DOE, relatively mild with going up stairs, as well as today walking room to room and having to sit to rest per wife;  Pt denies CP, wheezing, orthopnea, pnd, worsening LE edema, palps, dizziness or syncope .  Pt denies new neuro symptoms such as headache, facial or extremity weakness   No other new complaints such as fever, ST, increased prod of cough, myalgiasm, or falls.    Problems Prior to Update: 1)  Dyspnea On Exertion  (ICD-786.09) 2)  Bradycardia  (ICD-427.89) 3)  Abdominal Pain  (ICD-789.00) 4)  Fatigue  (ICD-780.79) 5)  Preventive Health Care  (ICD-V70.0) 6)  Hip Pain, Right  (ICD-719.45) 7)  Pneumonia, Right Upper Lobe  (ICD-486) 8)  Uti  (ICD-599.0) 9)  Fever Unspecified  (ICD-780.60) 10)  Uti  (ICD-599.0) 11)  Dementia  (ICD-294.8) 12)  Hyperlipidemia  (ICD-272.4) 13)  Benign Prostatic Hypertrophy  (ICD-600.00) 14)  Hypertension  (ICD-401.9) 15)  Diabetes Mellitus, Type II  (ICD-250.00) 16)  Accidental Falls, Recurrent  (ICD-E888.9) 17)  Thrush  (ICD-112.0) 18)  Tinnitus  (ICD-388.30) 19)  Hypokalemia  (ICD-276.8) 20)  Vitamin B12 Deficiency  (ICD-266.2) 21)  Loss of Weight  (ICD-783.21) 22)  Anemia-nos  (ICD-285.9) 23)  Nausea With Vomiting  (ICD-787.01) 24)  B12 Deficiency  (ICD-266.2) 25)  Paresthesia  (ICD-782.0) 26)  Gait Disturbance  (ICD-781.2) 27)  Back Pain,  Thoracic Region  (ICD-724.1) 28)  Preventive Health Care  (ICD-V70.0) 29)  Hypersomnia  (ICD-780.54) 30)  Peripheral Edema  (ICD-782.3) 31)  Olecranon Bursitis, Right  (ICD-726.33) 32)  Bursitis, Right Knee  (ICD-726.60) 33)  Abnormality of Gait  (ICD-781.2) 34)  Depression  (ICD-311) 35)  Arthritis  (ICD-716.90) 36)  Dizziness  (ICD-780.4) 37)  Hx of Clostridium Difficile Colitis  (ICD-008.45) 38)  Gerd  (ICD-530.81) 39)  Thyroid Nodule  (ICD-241.0) 40)  Nephrolithiasis, Hx of  (ICD-V13.01) 41)  Hypothyroidism  (ICD-244.9) 42)  Psa, Increased  (ICD-790.93) 43)  Rotator Cuff Syndrome, Left  (ICD-726.10) 44)  Sleep Apnea, Obstructive  (ICD-327.23) 45)  Psa, Increased  (ICD-790.93) 46)  Family History Diabetes 1st Degree Relative  (ICD-V18.0) 47)  Family History of Alcoholism/addiction  (ICD-V61.41) 48)  Neop, Malignant, 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 1 By Mouth Qd 2)  Azor 10-40 Mg Tabs (Amlodipine-Olmesartan) .Marland Kitchen.. 1po Once Daily 3)  Furosemide 20 Mg Tabs (Furosemide) .... Take 1 By Mouth Once Daily By Mouth Mon-Wed-Fri Only 4)  Levothroid 100 Mcg Tabs (Levothyroxine Sodium) .... Take 1 Tablet By Mouth Once A Day 5)  Onetouch Test  Strp (Glucose Blood) .... Use 1 Strip Once A Day - Dx Code 250.00 6)  Lansoprazole 30 Mg Cpdr (Lansoprazole) .Marland Kitchen.. 1 By Mouth Once Daily 7)  Citalopram Hydrobromide 20 Mg  Tabs (Citalopram Hydrobromide) .Marland Kitchen.. 1 By Mouth Once Daily 8)  Adult Aspirin Low Strength 81 Mg  Tbdp (Aspirin) .... Take 1 Tablet By Mouth Once A Day 9)  Alprazolam 0.25 Mg  Tabs (Alprazolam) .Marland Kitchen.. 1by Mouth  Two Times A Day As Needed - To Fill May 10, 2009 10)  Proair Hfa 108 (90 Base) Mcg/act  Aers (Albuterol Sulfate) .... 2 Puffs Q 6 Hrs As Needed 11)  Tylenol Extra Strength 500 Mg Tabs (Acetaminophen) ....  Once Daily 12)  Vitamin D3 1000 Unit  Tabs (Cholecalciferol) .Marland Kitchen.. 1 By Mouth Daily 13)  Cyanocobalamin 1000 Mcg/ml Soln (Cyanocobalamin) .Marland Kitchen.. 1 Cc Im Q Month 14)  Wellbutrin Xl 150 Mg Xr24h-Tab (Bupropion Hcl) .Marland Kitchen.. 1po Once Daily (Generic Ok) 15)  Creon 12000 Unit Cpep (Pancrelipase (Lip-Prot-Amyl)) .Marland Kitchen.. 1-2 Tablets By Mouth Three Times A Day With Each Meal 16)  Meclizine Hcl 25 Mg Tabs (Meclizine Hcl) .... 1/2 - 1 By Mouth Q 6 Hrs As Needed Dizziness 17)  Metformin Hcl 500 Mg Tabs (Metformin Hcl) .Marland Kitchen.. 1 By Mouth Two Times A Day 18)  Zyprexa 5 Mg Tabs (Olanzapine) .Marland Kitchen.. 1po Once Daily 19)  Zofran 4 Mg Tabs (Ondansetron Hcl) .Marland Kitchen.. 1po Q 6 Hrs As Needed Nausea 20)  Flomax 0.4 Mg Caps (Tamsulosin Hcl) .Marland Kitchen.. 1 By Mouth Once Qhs 21)  Lomotil 2.5-0.025 Mg Tabs (Diphenoxylate-Atropine) .Marland Kitchen.. 1 By Mouth As Needed Loose Stool, Max 8 Tabs Per 24 Hrs 22)  Promethazine Hcl 25 Mg Tabs (Promethazine Hcl) .Marland Kitchen.. 1po Q 6 Hrs As Needed  Current Medications (verified): 1)  Fenofibrate 160 Mg Tabs (Fenofibrate) .Marland Kitchen.. 1 By Mouth Qd 2)  Azor 10-40 Mg Tabs (Amlodipine-Olmesartan) .Marland Kitchen.. 1po Once Daily 3)  Furosemide 20 Mg Tabs (Furosemide) .... Take 1 By Mouth Once Daily By Mouth 5 Days Per Day (None On Wed and Sun) 4)  Levothroid 100 Mcg Tabs (Levothyroxine Sodium) .... Take 1 Tablet By Mouth Once A Day 5)  Onetouch Test  Strp (Glucose Blood) .... Use 1 Strip Once A Day - Dx Code 250.00 6)  Lansoprazole 30 Mg Cpdr (Lansoprazole) .Marland Kitchen.. 1 By Mouth Once Daily 7)  Citalopram Hydrobromide 20 Mg  Tabs (Citalopram Hydrobromide) .Marland Kitchen.. 1 By Mouth Once Daily 8)  Adult Aspirin Low Strength 81 Mg  Tbdp (Aspirin) .... Take 1 Tablet By Mouth Once A Day 9)  Alprazolam 0.25 Mg  Tabs (Alprazolam) .Marland Kitchen.. 1by Mouth  Two Times A Day As Needed - To Fill May 10, 2009 10)  Proair Hfa 108 (90 Base) Mcg/act  Aers (Albuterol Sulfate) .... 2 Puffs Q 6 Hrs As Needed 11)  Tylenol Extra Strength 500 Mg Tabs (Acetaminophen) .... Once Daily 12)  Vitamin D3  1000 Unit  Tabs (Cholecalciferol) .Marland Kitchen.. 1 By Mouth Daily 13)  Cyanocobalamin 1000 Mcg/ml Soln (Cyanocobalamin) .Marland Kitchen.. 1 Cc Im Q Month 14)  Wellbutrin Xl 150 Mg Xr24h-Tab (Bupropion Hcl) .Marland Kitchen.. 1po Once Daily (Generic Ok) 15)  Creon 12000 Unit Cpep (Pancrelipase (Lip-Prot-Amyl)) .Marland Kitchen.. 1-2 Tablets By Mouth Three Times A Day With Each Meal 16)  Meclizine Hcl 25 Mg Tabs (Meclizine Hcl) .... 1/2 - 1 By Mouth Q 6 Hrs As Needed Dizziness 17)  Metformin Hcl 500 Mg Tabs (Metformin Hcl) .Marland Kitchen.. 1 By Mouth Two Times A Day 18)  Zyprexa 5 Mg Tabs (Olanzapine) .Marland KitchenMarland KitchenMarland Kitchen  1po Once Daily 19)  Zofran 4 Mg Tabs (Ondansetron Hcl) .Marland Kitchen.. 1po Q 6 Hrs As Needed Nausea 20)  Flomax 0.4 Mg Caps (Tamsulosin Hcl) .Marland Kitchen.. 1 By Mouth Once Qhs 21)  Lomotil 2.5-0.025 Mg Tabs (Diphenoxylate-Atropine) .Marland Kitchen.. 1 By Mouth As Needed Loose Stool, Max 8 Tabs Per 24 Hrs 22)  Promethazine Hcl 25 Mg Tabs (Promethazine Hcl) .Marland Kitchen.. 1po Q 6 Hrs As Needed  Allergies (verified): 1)  ! 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   Head:  normocephalic and atraumatic.   Eyes:  vision grossly intact, pupils equal, and pupils round.   Ears:  R ear normal and L ear normal.   Nose:  no  external deformity and no nasal discharge.   Mouth:  no gingival abnormalities and pharynx pink and moist.   Neck:  supple and no masses.  , no JVD Lungs:  normal respiratory effort.  but bibas rales mild L>R Heart:  normal rate and regular rhythm.   Extremities:  no edema, no erythema    Impression & Recommendations:  Problem # 1:  DYSPNEA ON EXERTION (ICD-786.09)  His updated medication list for this problem includes:    Furosemide 20 Mg Tabs (Furosemide) .Marland Kitchen... Take 1 by mouth once daily by mouth 5 days per day (none on wed and sun)    Proair Hfa 108 (90 Base) Mcg/act Aers (Albuterol sulfate) .Marland Kitchen... 2 puffs q 6 hrs as needed mild, with bibas rale L>R but o/w volume status seems ok and no orthopnea, pnd, CP - will increase the lasix to 5 days per wk (none on wed and sun) with the exception of this wednesday - ok to take then; has f/u appt with dr Tenny Craw approx 10 days, to check cxr with recent pna as well   Orders: T-2 View CXR, Same Day (71020.5TC)  Problem # 2:  HYPERTENSION (ICD-401.9)  His updated medication list for this problem includes:    Azor 10-40 Mg Tabs (Amlodipine-olmesartan) .Marland Kitchen... 1po once daily    Furosemide 20 Mg Tabs (Furosemide) .Marland Kitchen... Take 1 by mouth once daily by mouth 5 days per day (none on wed and sun)  BP today: 142/60 Prior BP: 160/62 (06/10/2009)  Labs Reviewed: K+: 3.9 (06/10/2009) Creat: : 1.6 (06/10/2009)   Chol: 154 (05/18/2009)   HDL: 67.90 (05/18/2009)   LDL: 49 (05/18/2009)   TG: 184.0 (05/18/2009) improved overall, Continue all other previous medications as before this visit  Complete Medication List: 1)  Fenofibrate 160 Mg Tabs (Fenofibrate) .Marland Kitchen.. 1 by mouth qd 2)  Azor 10-40 Mg Tabs (Amlodipine-olmesartan) .Marland Kitchen.. 1po once daily 3)  Furosemide 20 Mg Tabs (Furosemide) .... Take 1 by mouth once daily by mouth 5 days per day (none on wed and sun) 4)  Levothroid 100 Mcg Tabs (Levothyroxine sodium) .... Take 1 tablet by mouth once a day 5)  Onetouch Test  Strp (Glucose blood) .... Use 1 strip once a day - dx code 250.00 6)  Lansoprazole 30 Mg Cpdr (Lansoprazole) .Marland Kitchen.. 1 by mouth once daily 7)  Citalopram Hydrobromide 20 Mg Tabs (Citalopram hydrobromide) .Marland Kitchen.. 1 by mouth once daily 8)  Adult Aspirin Low Strength 81 Mg Tbdp (Aspirin) .... Take 1 tablet by mouth once a day 9)  Alprazolam 0.25 Mg Tabs (Alprazolam) .Marland Kitchen.. 1by mouth  two times a day as needed - to fill May 10, 2009 10)  Proair Hfa 108 (90 Base) Mcg/act Aers (Albuterol sulfate) .... 2 puffs q 6 hrs as needed 11)  Tylenol Extra Strength 500 Mg Tabs (Acetaminophen) .... Once daily 12)  Vitamin D3 1000 Unit Tabs (Cholecalciferol) .Marland Kitchen.. 1 by mouth daily 13)  Cyanocobalamin 1000 Mcg/ml Soln (Cyanocobalamin) .Marland Kitchen.. 1 cc im q month 14)  Wellbutrin Xl 150 Mg Xr24h-tab (Bupropion hcl) .Marland Kitchen.. 1po once daily (generic ok) 15)  Creon 12000 Unit Cpep (Pancrelipase (lip-prot-amyl)) .Marland Kitchen.. 1-2 tablets by mouth three times a day with each meal 16)  Meclizine Hcl 25 Mg Tabs (Meclizine hcl) .... 1/2 - 1 by mouth q 6 hrs as needed dizziness 17)  Metformin Hcl 500 Mg Tabs (Metformin hcl) .Marland Kitchen.. 1 by mouth two times a day 18)  Zyprexa 5 Mg Tabs (Olanzapine) .Marland Kitchen.. 1po once daily 19)  Zofran 4 Mg Tabs (Ondansetron hcl) .Marland Kitchen.. 1po q 6 hrs as needed nausea 20)  Flomax 0.4 Mg Caps (Tamsulosin hcl) .Marland Kitchen.. 1 by mouth once qhs 21)  Lomotil 2.5-0.025 Mg Tabs (Diphenoxylate-atropine) .Marland Kitchen.. 1 by mouth as needed loose stool, max 8 tabs per 24 hrs 22)  Promethazine Hcl 25 Mg Tabs (Promethazine hcl) .Marland Kitchen.. 1po q 6 hrs as needed  Patient Instructions: 1)  increase the furosemide 20 mg to 5 days per wk (Do Not Take on Wed or Fridays)  -   (except please take this wednesday, but that would be the only wednesday until you see Dr Tenny Craw) 2)  Continue all previous medications as before this visit  3)  Please go to Radiology in the basement level for your X-Ray today  4)  Please keep your appt with Dr Tenny Craw as planned 5)  Please schedule a follow-up  appointment as needed. Prescriptions: FUROSEMIDE 20 MG TABS (FUROSEMIDE) Take 1 by mouth once daily by mouth 5 days per day (none on Wed and Sun)  #30 x 11   Entered and Authorized by:   Corwin Levins MD   Signed by:   Corwin Levins MD on 06/28/2009   Method used:   Print then Give to Patient   RxID:   [id number]
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
## 2010-04-05 NOTE — Miscellaneous (Signed)
Summary: Orders/[facility]  Orders/[facility]   Imported By: Lester Borger 08/03/2009 08:51:17  _____________________________________________________________________  External Attachment:    Type:   Image     Comment:   External Document

## 2010-04-05 NOTE — Progress Notes (Signed)
Summary: Sob,dizzy,lighthead   Phone Note Call from Patient Call back at Home Phone [phone]   Caller: Nathan Blackwell Summary of Call: Pt having sob,dizzy and lighthead Initial call taken by: Judie Grieve,  June 28, 2009 11:30 AM  Follow-up for Phone Call        talked with wife -since Saturday he seems to be more SOB just walking from room to room--pt does not weigh daily,wife states he has edema but the Lasix takes care of that,I I told pt's wife I was trying to get information from her because Nathan Blackwell was not scheduled to be in the office until 07-02-09 and I would need to discuss with one of the other cardiologists--wife states she will call and talk with Nathan Blackwell today and discuss pt's symptoms with him-pt has appt 07-09-09 with Nathan Blackwell--

## 2010-04-05 NOTE — Procedures (Signed)
Summary: Upper Endoscopy  Patient: Rosevelt Luu Note: All result statuses are Final unless otherwise noted.  Tests: (1) Upper Endoscopy (EGD)   EGD Upper Endoscopy       DONE     [facility]     [address]     [place], [place]  [address]           ENDOSCOPY PROCEDURE REPORT           PATIENT:  Yamin, Swingler  MR#:  [id number]     BIRTHDATE:  08/29/1920, 88 yrs. old  GENDER:  male           ENDOSCOPIST:  Barbette Hair. Arlyce Dice, MD     Referred by:           PROCEDURE DATE:  05/12/2009     PROCEDURE:  EGD, diagnostic     ASA CLASS:  Class II     INDICATIONS:  nausea and vomiting           MEDICATIONS:   Fentanyl 50 mcg IV, Versed 5 mg IV, glycopyrrolate     (Robinal) 0.2 mg IV, 0.6cc simethancone 0.6 cc PO     TOPICAL ANESTHETIC:  Exactacain Spray           DESCRIPTION OF PROCEDURE:   After the risks benefits and     alternatives of the procedure were thoroughly explained, informed     consent was obtained.  The LB [id number] endoscope was     introduced through the mouth and advanced to the third portion of     the duodenum, without limitations.  The instrument was slowly     withdrawn as the mucosa was fully examined.     <<PROCEDUREIMAGES>>           A hiatal hernia was found at the gastroesophageal junction (see     image3). Possible small paraesophageal hiatal hernia  A stricture     was found at the gastroesophageal junction (see image4). Early     esophageal stricture  Otherwise the examination was normal.     Retroflexed views revealed no abnormalities.    The scope was then     withdrawn from the patient and the procedure completed.           COMPLICATIONS:  None           ENDOSCOPIC IMPRESSION:     1) Possible paraesophageal Hiatal hernia at the gastroesophageal     junction     2) Stricture at the gastroesophageal junction     3) Otherwise normal examination     RECOMMENDATIONS:     1) My office will arrange for you to have a Gastric Emptying   Scan performed. This is a radiology test that gives an idea of how     well your stomach functions.     2) UGI series     2) Call office next 2-3 days to schedule an office appointment     for 2 weeks           REPEAT EXAM:  No           ______________________________     Barbette Hair. Arlyce Dice, MD           CC:  Corwin Levins, MD           n.     Rosalie Doctor:   Barbette Hair. Kaplan at 05/12/2009 04:30 PM  [place], [place], [phone]  Note: An exclamation mark (!) indicates a result that was not dispersed into the flowsheet. Document Creation Date: 05/12/2009 4:30 PM _______________________________________________________________________  (1) Order result status: Final Collection or observation date-time: 05/12/2009 16:22 Requested date-time:  Receipt date-time:  Reported date-time:  Referring Physician:   Ordering Physician: Melvia Heaps [phone]) Specimen Source:  Source: Launa Grill Order Number: [id number] Lab site:

## 2010-04-05 NOTE — Miscellaneous (Signed)
Summary: BONE DENSITY  Clinical Lists Changes  Orders: Added new Test order of T-Bone Densitometry (77080) - Signed Added new Test order of T-Lumbar Vertebral Assessment (77082) - Signed 

## 2010-04-05 NOTE — Progress Notes (Signed)
  Phone Note Refill Request  on March 29, 2009 11:22 AM  Refills Requested: Medication #1:  PROAIR HFA 108 (90 BASE) MCG/ACT  AERS 2 puffs q 6 hrs as needed   Dosage confirmed as above?Dosage Confirmed   Notes: [facility] Initial call taken by: Scharlene Gloss,  March 29, 2009 11:23 AM    Prescriptions: PROAIR HFA 108 (90 BASE) MCG/ACT  AERS (ALBUTEROL SULFATE) 2 puffs q 6 hrs as needed  #1 x 11   Entered by:   Zella Ball Ewing   Authorized by:   Corwin Levins MD   Signed by:   Scharlene Gloss on 03/29/2009   Method used:   Faxed to ...       [facility] [phone]* (retail)       [address] [place], [place]  [address]       Ph: [phone]       Fax: [phone]   RxID:   [id number]

## 2010-04-05 NOTE — Assessment & Plan Note (Signed)
Summary: POST HOSP/ [facility]   Vital Signs:  Patient profile:   77 year old male Height:      69 inches Weight:      152 pounds O2 Sat:      97 % on Room air Temp:     96.7 degrees F oral Pulse rate:   63 / minute BP sitting:   124 / 58  (left arm) Cuff size:   regular  Vitals Entered ByZella Ball Ewing (April 01, 2009 1:48 PM)  O2 Flow:  Room air CC: post hospital/RE   Primary Care Provider:  Oliver Barre, MD  CC:  post hospital/RE.  History of Present Illness: here post hospn and rehab stay, overall doing well, getting stronger and only using the walker outside the home for balance only;  is supposed to have PT at home to learn to use a cane but has not started yet;  still with some decreased taste, and still has some loose stool but no further frank diarrhea and accidents at home on the BR floor; did not like the rehab food but the facility was ok - food was too bland per pt;  overall lost 8 lbs since november;  wants to know if he can take ensure;  had recent RUL pna with resolved cough, sob, CP except for mild dyspnea on lying down.  No orthopnea,  pnd or worsening LE edema.  States he can walk up a flight of stairs at home much eaiser than last november.  last hgb 9.7 on d/c from the hosp and iron given while hospd but not taken since d/c from hosp b/c wife though she needed a prescription for this OTC med.  Pt denies new neuro symptoms such as headache, facial or extremity weakness  Also menitoins awoke this am with new onset mild right lateral hip tenderness, no sweling or erythema, no recent falls, clearly seemed worse after lying the right side last night.  no longer taking the SSI as at the hospital and the rehab. denies polydipsia and polyuria.    Problems Prior to Update: 1)  Uti  (ICD-599.0) 2)  Fever Unspecified  (ICD-780.60) 3)  Uti  (ICD-599.0) 4)  Dementia  (ICD-294.8) 5)  Hyperlipidemia  (ICD-272.4) 6)  Benign Prostatic Hypertrophy  (ICD-600.00) 7)  Hypertension   (ICD-401.9) 8)  Diabetes Mellitus, Type II  (ICD-250.00) 9)  Accidental Falls, Recurrent  (ICD-E888.9) 10)  Thrush  (ICD-112.0) 11)  Tinnitus  (ICD-388.30) 12)  Hypokalemia  (ICD-276.8) 13)  Vitamin B12 Deficiency  (ICD-266.2) 14)  Loss of Weight  (ICD-783.21) 15)  Anemia-nos  (ICD-285.9) 16)  Nausea With Vomiting  (ICD-787.01) 17)  B12 Deficiency  (ICD-266.2) 18)  Paresthesia  (ICD-782.0) 19)  Gait Disturbance  (ICD-781.2) 20)  Back Pain, Thoracic Region  (ICD-724.1) 21)  Preventive Health Care  (ICD-V70.0) 22)  Hypersomnia  (ICD-780.54) 23)  Peripheral Edema  (ICD-782.3) 24)  Olecranon Bursitis, Right  (ICD-726.33) 25)  Bursitis, Right Knee  (ICD-726.60) 26)  Abnormality of Gait  (ICD-781.2) 27)  Depression  (ICD-311) 28)  Arthritis  (ICD-716.90) 29)  Dizziness  (ICD-780.4) 30)  Hx of Clostridium Difficile Colitis  (ICD-008.45) 31)  Gerd  (ICD-530.81) 32)  Thyroid Nodule  (ICD-241.0) 33)  Nephrolithiasis, Hx of  (ICD-V13.01) 34)  Hypothyroidism  (ICD-244.9) 35)  Psa, Increased  (ICD-790.93) 36)  Rotator Cuff Syndrome, Left  (ICD-726.10) 37)  Sleep Apnea, Obstructive  (ICD-327.23) 38)  Psa, Increased  (ICD-790.93) 39)  Family History Diabetes 1st Degree Relative  (  ICD-V18.0) 40)  Family History of Alcoholism/addiction  (ICD-V61.41) 41)  Neop, Malignant, 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 1 By Mouth Qd 2)  Avapro 300 Mg Tabs (Irbesartan) .Marland Kitchen.. 1 Tablet By Mouth Once A Day 3)  Felodipine 10 Mg Tb24 (Felodipine) .... Take 1 By Mouth Qd 4)  Furosemide 20 Mg Tabs (Furosemide) .... Take 1 By Mouth Once Daily 5)  Levothroid 100 Mcg Tabs (Levothyroxine Sodium) .... Take 1 Tablet By Mouth Once A Day 6)  Lovastatin 40 Mg Tabs (Lovastatin) .... Take 1 Tablet By Mouth Once A Day 7)  Onetouch Test  Strp (Glucose  Blood) .... Use 1 Strip Once A Day - Dx Code 250.00 8)  Lansoprazole 30 Mg Cpdr (Lansoprazole) .Marland Kitchen.. 1 By Mouth Once Daily 9)  Citalopram Hydrobromide 20 Mg  Tabs (Citalopram Hydrobromide) .Marland Kitchen.. 1 By Mouth Once Daily 10)  Adult Aspirin Low Strength 81 Mg  Tbdp (Aspirin) .... Take 1 Tablet By Mouth Once A Day 11)  Alprazolam 0.25 Mg  Tabs (Alprazolam) .Marland Kitchen.. 1by Mouth  Two Times A Day As Needed 12)  Proair Hfa 108 (90 Base) Mcg/act  Aers (Albuterol Sulfate) .... 2 Puffs Q 6 Hrs As Needed 13)  Tylenol Extra Strength 500 Mg Tabs (Acetaminophen) .... Once Daily 14)  Vitamin D3 1000 Unit  Tabs (Cholecalciferol) .Marland Kitchen.. 1 By Mouth Daily 15)  Cyanocobalamin 1000 Mcg/ml Soln (Cyanocobalamin) .Marland Kitchen.. 1 Cc Im Q Month 16)  Wellbutrin Xl 150 Mg Xr24h-Tab (Bupropion Hcl) .Marland Kitchen.. 1po Once Daily (Generic Ok) 17)  Creon 12000 Unit Cpep (Pancrelipase (Lip-Prot-Amyl)) .Marland Kitchen.. 1-2 Tablets By Mouth Three Times A Day With Each Meal 18)  Meclizine Hcl 25 Mg Tabs (Meclizine Hcl) .... 1/2 - 1 By Mouth Q 6 Hrs As Needed Dizziness 19)  Metformin Hcl 500 Mg Tabs (Metformin Hcl) .Marland Kitchen.. 1 By Mouth Two Times A Day 20)  Zyprexa 5 Mg Tabs (Olanzapine) .Marland Kitchen.. 1po Once Daily 21)  Benzonatate 100 Mg Caps (Benzonatate) .Marland Kitchen.. 1 By Mouth Three Times A Day As Needed Cough 22)  Nystatin 100000 Unit/ml Susp (Nystatin) .... 5 Cc Poi Swish and Spit Four Times Per Day For 10 Days 23)  Hydrocortisone Ace-Pramoxine 2.5-1 % Crea (Hydrocortisone Ace-Pramoxine) .... Use Asd  As Needed 24)  Promethegan 25 Mg Supp (Promethazine Hcl) .Marland Kitchen.. 1 Suppository Per Rectum Every Six Hours 25)  Flomax 0.4 Mg Caps (Tamsulosin Hcl) .Marland Kitchen.. 1 By Mouth Once Qhs 26)  Lomotil 2.5-0.025 Mg Tabs (Diphenoxylate-Atropine) .Marland Kitchen.. 1 By Mouth As Needed Loose Stool, Max 8 Tabs Per 24 Hrs  Current Medications (verified): 1)  Fenofibrate 160 Mg Tabs (Fenofibrate) .Marland Kitchen.. 1 By Mouth Qd 2)  Benicar 40 Mg Tabs (Olmesartan Medoxomil) .Marland Kitchen.. 1po Once Daily 3)  Felodipine 10 Mg Tb24 (Felodipine) .... Take  1 By Mouth Qd 4)  Furosemide 20 Mg Tabs (Furosemide) .... Take 1 By Mouth Once Daily 5)  Levothroid 100 Mcg Tabs (Levothyroxine Sodium) .... Take 1 Tablet By Mouth Once A Day 6)  Lansoprazole 30 Mg Cpdr (Lansoprazole) .Marland Kitchen.. 1 By Mouth Once Daily 7)  Onetouch Test  Strp (Glucose Blood) .... Use 1 Strip Once A Day - Dx Code 250.00 8)  Lansoprazole 30 Mg Cpdr (Lansoprazole) .Marland Kitchen.. 1 By Mouth Once Daily 9)  Citalopram Hydrobromide 20 Mg  Tabs (Citalopram Hydrobromide) .Marland Kitchen.. 1 By Mouth Once Daily 10)  Adult Aspirin Low Strength 81 Mg  Tbdp (Aspirin) .... Take 1 Tablet By Mouth Once A Day 11)  Alprazolam 0.25 Mg  Tabs (Alprazolam) .Marland Kitchen.. 1by Mouth  Two Times A Day As Needed 12)  Proair Hfa 108 (90 Base) Mcg/act  Aers (Albuterol Sulfate) .... 2 Puffs Q 6 Hrs As Needed 13)  Tylenol Extra Strength 500 Mg Tabs (Acetaminophen) .... Once Daily 14)  Vitamin D3 1000 Unit  Tabs (Cholecalciferol) .Marland Kitchen.. 1 By Mouth Daily 15)  Cyanocobalamin 1000 Mcg/ml Soln (Cyanocobalamin) .Marland Kitchen.. 1 Cc Im Q Month 16)  Wellbutrin Xl 150 Mg Xr24h-Tab (Bupropion Hcl) .Marland Kitchen.. 1po Once Daily (Generic Ok) 17)  Creon 12000 Unit Cpep (Pancrelipase (Lip-Prot-Amyl)) .Marland Kitchen.. 1-2 Tablets By Mouth Three Times A Day With Each Meal 18)  Meclizine Hcl 25 Mg Tabs (Meclizine Hcl) .... 1/2 - 1 By Mouth Q 6 Hrs As Needed Dizziness 19)  Metformin Hcl 500 Mg Tabs (Metformin Hcl) .Marland Kitchen.. 1 By Mouth Two Times A Day 20)  Zyprexa 5 Mg Tabs (Olanzapine) .Marland Kitchen.. 1po Once Daily 21)  Promethegan 25 Mg Supp (Promethazine Hcl) .Marland Kitchen.. 1 Suppository Per Rectum Every Six Hours 22)  Flomax 0.4 Mg Caps (Tamsulosin Hcl) .Marland Kitchen.. 1 By Mouth Once Qhs 23)  Lomotil 2.5-0.025 Mg Tabs (Diphenoxylate-Atropine) .Marland Kitchen.. 1 By Mouth As Needed Loose Stool, Max 8 Tabs Per 24 Hrs 24)  Proair Hfa 108 (90 Base) Mcg/act Aers (Albuterol Sulfate) .... 2 Puffs Qid As Needed 25)  Lovastatin 40 Mg Tabs (Lovastatin) .Marland Kitchen.. 1 By Mouth Once Daily  Allergies (verified): 1)  ! Biaxin  Past History:  Past Medical  History: Last updated: 07/08/2008 Diabetes mellitus, type II Hyperlipidemia Hypertension Colon cancer,hx of Vocal Cord cancer Osteoporosis Benign prostatic hypertrophy OSA chronic rotater cuff syndrome Elevated PSA Hypothyroidism Nephrolithiasis thyroid nodule GERD presumed c diff colitis 11/08 Dementia Anemia-NOS B12 deficiency Hemorrhoids  Past Surgical History: Last updated: 07/08/2008 Cholecystectomy 1999 Inguinal herniorrhaphy 1984 Appendectomy 1998 Colon Resection 1998  Social History: Last updated: 09/26/2007 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   Eyes:  vision grossly intact, pupils equal, and pupils round.   Ears:  R ear normal and L ear normal.   Nose:  no external deformity and no nasal discharge.   Mouth:  no gingival abnormalities and pharynx pink and moist.   Neck:  supple and no masses.   Lungs:  normal respiratory effort, normal breath sounds, no dullness, and no wheezes.   Heart:  normal rate and regular rhythm.   Abdomen:  soft, non-tender, and normal bowel sounds.   Msk:  no joint tenderness and no joint swelling.  except for tender mild over the right greater trochanter Extremities:  no edema, no erythema  Neurologic:  alert, pleasantly demented   Impression & Recommendations:  Problem # 1:  PNEUMONIA, RIGHT UPPER LOBE (ICD-486) clinically resolved, do not feel he needs f/u cxr, to f/u any worsening s/s  Problem # 2:  ANEMIA-NOS (ICD-285.9)  His updated medication list for this problem includes:    Cyanocobalamin 1000 Mcg/ml Soln (Cyanocobalamin) .Marland Kitchen... 1 cc im q month to start the iron supplement, f/u next visit, watch for any weakness, sob/doe, CP or  overt bleeding  Problem # 3:  HIP  PAIN, RIGHT (ICD-719.45)  His updated medication list for this problem includes:    Adult Aspirin Low Strength 81 Mg Tbdp (Aspirin) .Marland Kitchen... Take 1 tablet by mouth once a day    Tylenol Extra Strength 500 Mg Tabs (Acetaminophen) ..... Once daily clinically c/w bursitis - ok to cont meds as is but avoid lying on the right side at night  Problem # 4:  HYPERTENSION (ICD-401.9)  His updated medication list for this problem includes:    Benicar 40 Mg Tabs (Olmesartan medoxomil) .Marland Kitchen... 1po once daily    Felodipine 10 Mg Tb24 (Felodipine) .Marland Kitchen... Take 1 by mouth qd    Furosemide 20 Mg Tabs (Furosemide) .Marland Kitchen... Take 1 by mouth once daily  BP today: 124/58 Prior BP: 138/82 (01/27/2009)  Labs Reviewed: K+: 3.8 (01/25/2009) Creat: : 1.6 (01/25/2009)   Chol: 142 (06/23/2008)   HDL: 48.30 (06/23/2008)   LDL: 65 (06/23/2008)   TG: 143.0 (06/23/2008) stable overall by hx and exam, ok to continue meds/tx as is   Complete Medication List: 1)  Fenofibrate 160 Mg Tabs (Fenofibrate) .Marland Kitchen.. 1 by mouth qd 2)  Benicar 40 Mg Tabs (Olmesartan medoxomil) .Marland Kitchen.. 1po once daily 3)  Felodipine 10 Mg Tb24 (Felodipine) .... Take 1 by mouth qd 4)  Furosemide 20 Mg Tabs (Furosemide) .... Take 1 by mouth once daily 5)  Levothroid 100 Mcg Tabs (Levothyroxine sodium) .... Take 1 tablet by mouth once a day 6)  Lansoprazole 30 Mg Cpdr (Lansoprazole) .Marland Kitchen.. 1 by mouth once daily 7)  Onetouch Test Strp (Glucose blood) .... Use 1 strip once a day - dx code 250.00 8)  Lansoprazole 30 Mg Cpdr (Lansoprazole) .Marland Kitchen.. 1 by mouth once daily 9)  Citalopram Hydrobromide 20 Mg Tabs (Citalopram hydrobromide) .Marland Kitchen.. 1 by mouth once daily 10)  Adult Aspirin Low Strength 81 Mg Tbdp (Aspirin) .... Take 1 tablet by mouth once a day 11)  Alprazolam 0.25 Mg Tabs (Alprazolam) .Marland Kitchen.. 1by mouth  two times a day as needed 12)  Proair Hfa 108 (90 Base) Mcg/act Aers (Albuterol sulfate) .... 2 puffs q 6 hrs as needed 13)  Tylenol  Extra Strength 500 Mg Tabs (Acetaminophen) .... Once daily 14)  Vitamin D3 1000 Unit Tabs (Cholecalciferol) .Marland Kitchen.. 1 by mouth daily 15)  Cyanocobalamin 1000 Mcg/ml Soln (Cyanocobalamin) .Marland Kitchen.. 1 cc im q month 16)  Wellbutrin Xl 150 Mg Xr24h-tab (Bupropion hcl) .Marland Kitchen.. 1po once daily (generic ok) 17)  Creon 12000 Unit Cpep (Pancrelipase (lip-prot-amyl)) .Marland Kitchen.. 1-2 tablets by mouth three times a day with each meal 18)  Meclizine Hcl 25 Mg Tabs (Meclizine hcl) .... 1/2 - 1 by mouth q 6 hrs as needed dizziness 19)  Metformin Hcl 500 Mg Tabs (Metformin hcl) .Marland Kitchen.. 1 by mouth two times a day 20)  Zyprexa 5 Mg Tabs (Olanzapine) .Marland Kitchen.. 1po once daily 21)  Promethegan 25 Mg Supp (Promethazine hcl) .Marland Kitchen.. 1 suppository per rectum every six hours 22)  Flomax 0.4 Mg Caps (Tamsulosin hcl) .Marland Kitchen.. 1 by mouth once qhs 23)  Lomotil 2.5-0.025 Mg Tabs (Diphenoxylate-atropine) .Marland Kitchen.. 1 by mouth as needed loose stool, max 8 tabs per 24 hrs 24)  Proair Hfa 108 (90 Base) Mcg/act Aers (Albuterol sulfate) .... 2 puffs qid as needed 25)  Lovastatin 40 Mg Tabs (Lovastatin) .Marland Kitchen.. 1 by mouth once daily  Other Orders: Vit B12 1000 mcg (J3420) Admin of Therapeutic Inj  intramuscular or subcutaneous (16109)  Patient Instructions: 1)  you can take the ensure - 1 can three times a day between meals as  needed 2)  plesae start the iron sulfate OTC 325 mg twice per day 3)  Continue all previous medications as before this visit  4)  Please schedule a follow-up appointment in 1 month to check the iron and the anemia   Medication Administration  Injection # 1:    Medication: Vit B12 1000 mcg    Diagnosis: VITAMIN B12 DEFICIENCY (ICD-266.2)    Route: IM    Site: L deltoid    Exp Date: 11/2010    Lot #: [id number]    Mfr: [employer]    Given by: Zella Ball Ewing (April 01, 2009 2:20 PM)  Orders Added: 1)  Vit B12 1000 mcg [J3420] 2)  Admin of Therapeutic Inj  intramuscular or subcutaneous [96372] 3)  Est. Patient Level IV [96045]

## 2010-04-05 NOTE — Miscellaneous (Signed)
Summary: Plan of Care & Treatment/[facility]  Plan of Care & Treatment/[facility]   Imported By: Sherian Rein 07/02/2009 14:33:06  _____________________________________________________________________  External Attachment:    Type:   Image     Comment:   External Document

## 2010-04-05 NOTE — Assessment & Plan Note (Signed)
Summary: DRY HEEVES-NOT EATING- WEAK  STC   Vital Signs:  Patient profile:   77 year old male Height:      69.5 inches Weight:      160 pounds O2 Sat:      97 % on Room air Temp:     98.5 degrees F oral Pulse rate:   48 / minute BP sitting:   160 / 62  (left arm) Cuff size:   regular  Vitals Entered ByZella Ball Ewing (June 10, 2009 3:49 PM)  O2 Flow:  Room air CC: not eating, weak, dizzy, throwing up/RE   Primary Care Provider:  Oliver Barre, MD  CC:  not eating, weak, dizzy, and throwing up/RE.  History of Present Illness: here with wife (who is starting to have some memory difficulties it seems per daughter, and who has difficulty dealing with the willful husband who is poor at med management but does not allow her to help signficantly);  has been holding the lasix as instructed with now swelling recurred to the LE's (right > left), also with right hand puffiness as well but denies fall or injury or pain;  no left hand swelling or pain;  nutritioin not great but has been adequete until recently, but here today primarily with c/o increased abd discomfort throughout , bloating, decreased appetitie, more weak, dizzy, with recurring nausea/vomiting, dry heaves worse to lie down, sleeping more for some reason, and fatigued; He and the family are not thinking of constipation ;  last BM yest am seemingly normal per pt without overly hard or strain, no diarrhea or blood.  Color seems somewhat "pasty" today.  ALso has some soreness to the post head and neck area after fall over a week ago.  No radicular symtpoms, other change in bowel or bladder function, fever, night sweats .  No dysuria, freq, urgency or hematuria.  States "I'm burning up" in the exam today but no fever, sweat or other complaitns besides above.  No flank pain, rash or joint pains.  No furthter falls or injury.  Problems Prior to Update: 1)  Bradycardia  (ICD-427.89) 2)  Abdominal Pain  (ICD-789.00) 3)  Fatigue  (ICD-780.79) 4)   Preventive Health Care  (ICD-V70.0) 5)  Hip Pain, Right  (ICD-719.45) 6)  Pneumonia, Right Upper Lobe  (ICD-486) 7)  Uti  (ICD-599.0) 8)  Fever Unspecified  (ICD-780.60) 9)  Uti  (ICD-599.0) 10)  Dementia  (ICD-294.8) 11)  Hyperlipidemia  (ICD-272.4) 12)  Benign Prostatic Hypertrophy  (ICD-600.00) 13)  Hypertension  (ICD-401.9) 14)  Diabetes Mellitus, Type II  (ICD-250.00) 15)  Accidental Falls, Recurrent  (ICD-E888.9) 16)  Thrush  (ICD-112.0) 17)  Tinnitus  (ICD-388.30) 18)  Hypokalemia  (ICD-276.8) 19)  Vitamin B12 Deficiency  (ICD-266.2) 20)  Loss of Weight  (ICD-783.21) 21)  Anemia-nos  (ICD-285.9) 22)  Nausea With Vomiting  (ICD-787.01) 23)  B12 Deficiency  (ICD-266.2) 24)  Paresthesia  (ICD-782.0) 25)  Gait Disturbance  (ICD-781.2) 26)  Back Pain, Thoracic Region  (ICD-724.1) 27)  Preventive Health Care  (ICD-V70.0) 28)  Hypersomnia  (ICD-780.54) 29)  Peripheral Edema  (ICD-782.3) 30)  Olecranon Bursitis, Right  (ICD-726.33) 31)  Bursitis, Right Knee  (ICD-726.60) 32)  Abnormality of Gait  (ICD-781.2) 33)  Depression  (ICD-311) 34)  Arthritis  (ICD-716.90) 35)  Dizziness  (ICD-780.4) 36)  Hx of Clostridium Difficile Colitis  (ICD-008.45) 37)  Gerd  (ICD-530.81) 38)  Thyroid Nodule  (ICD-241.0) 39)  Nephrolithiasis, Hx of  (ICD-V13.01) 40)  Hypothyroidism  (ICD-244.9) 41)  Psa, Increased  (ICD-790.93) 42)  Rotator Cuff Syndrome, Left  (ICD-726.10) 43)  Sleep Apnea, Obstructive  (ICD-327.23) 44)  Psa, Increased  (ICD-790.93) 45)  Family History Diabetes 1st Degree Relative  (ICD-V18.0) 46)  Family History of Alcoholism/addiction  (ICD-V61.41) 47)  Neop, Malignant, 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 1 By Mouth Qd 2)  Azor 10-40 Mg Tabs (Amlodipine-Olmesartan) .Marland Kitchen.. 1po Once Daily 3)   Furosemide 20 Mg Tabs (Furosemide) .... Take 1 By Mouth Once Daily As Needed Only For Swelling 4)  Levothroid 100 Mcg Tabs (Levothyroxine Sodium) .... Take 1 Tablet By Mouth Once A Day 5)  Onetouch Test  Strp (Glucose Blood) .... Use 1 Strip Once A Day - Dx Code 250.00 6)  Lansoprazole 30 Mg Cpdr (Lansoprazole) .Marland Kitchen.. 1 By Mouth Once Daily 7)  Citalopram Hydrobromide 20 Mg  Tabs (Citalopram Hydrobromide) .Marland Kitchen.. 1 By Mouth Once Daily 8)  Adult Aspirin Low Strength 81 Mg  Tbdp (Aspirin) .... Take 1 Tablet By Mouth Once A Day 9)  Alprazolam 0.25 Mg  Tabs (Alprazolam) .Marland Kitchen.. 1by Mouth  Two Times A Day As Needed - To Fill May 10, 2009 10)  Proair Hfa 108 (90 Base) Mcg/act  Aers (Albuterol Sulfate) .... 2 Puffs Q 6 Hrs As Needed 11)  Tylenol Extra Strength 500 Mg Tabs (Acetaminophen) .... Once Daily 12)  Vitamin D3 1000 Unit  Tabs (Cholecalciferol) .Marland Kitchen.. 1 By Mouth Daily 13)  Cyanocobalamin 1000 Mcg/ml Soln (Cyanocobalamin) .Marland Kitchen.. 1 Cc Im Q Month 14)  Wellbutrin Xl 150 Mg Xr24h-Tab (Bupropion Hcl) .Marland Kitchen.. 1po Once Daily (Generic Ok) 15)  Creon 12000 Unit Cpep (Pancrelipase (Lip-Prot-Amyl)) .Marland Kitchen.. 1-2 Tablets By Mouth Three Times A Day With Each Meal 16)  Meclizine Hcl 25 Mg Tabs (Meclizine Hcl) .... 1/2 - 1 By Mouth Q 6 Hrs As Needed Dizziness 17)  Metformin Hcl 500 Mg Tabs (Metformin Hcl) .Marland Kitchen.. 1 By Mouth Two Times A Day 18)  Zyprexa 5 Mg Tabs (Olanzapine) .Marland Kitchen.. 1po Once Daily 19)  Zofran 4 Mg Tabs (Ondansetron Hcl) .Marland Kitchen.. 1po Q 6 Hrs As Needed Nausea 20)  Flomax 0.4 Mg Caps (Tamsulosin Hcl) .Marland Kitchen.. 1 By Mouth Once Qhs 21)  Lomotil 2.5-0.025 Mg Tabs (Diphenoxylate-Atropine) .Marland Kitchen.. 1 By Mouth As Needed Loose Stool, Max 8 Tabs Per 24 Hrs 22)  Lovastatin 40 Mg Tabs (Lovastatin) .Marland Kitchen.. 1po Once Daily  Current Medications (verified): 1)  Fenofibrate 160 Mg Tabs (Fenofibrate) .Marland Kitchen.. 1 By Mouth Qd 2)  Azor 10-40 Mg Tabs (Amlodipine-Olmesartan) .Marland Kitchen.. 1po Once Daily 3)  Furosemide 20 Mg Tabs (Furosemide) .... Take 1 By Mouth Once  Daily As Needed Only For Swelling 4)  Levothroid 100 Mcg Tabs (Levothyroxine Sodium) .... Take 1 Tablet By Mouth Once A Day 5)  Onetouch Test  Strp (Glucose Blood) .... Use 1 Strip Once A Day - Dx Code 250.00 6)  Lansoprazole 30 Mg Cpdr (Lansoprazole) .Marland Kitchen.. 1 By Mouth Once Daily 7)  Citalopram Hydrobromide 20 Mg  Tabs (Citalopram Hydrobromide) .Marland Kitchen.. 1 By Mouth Once Daily 8)  Adult Aspirin Low Strength 81 Mg  Tbdp (Aspirin) .... Take 1 Tablet By Mouth Once A Day 9)  Alprazolam 0.25 Mg  Tabs (Alprazolam) .Marland Kitchen.. 1by Mouth  Two Times A Day As Needed - To Fill May 10, 2009 10)  Proair Hfa 108 (90  Base) Mcg/act  Aers (Albuterol Sulfate) .... 2 Puffs Q 6 Hrs As Needed 11)  Tylenol Extra Strength 500 Mg Tabs (Acetaminophen) .... Once Daily 12)  Vitamin D3 1000 Unit  Tabs (Cholecalciferol) .Marland Kitchen.. 1 By Mouth Daily 13)  Cyanocobalamin 1000 Mcg/ml Soln (Cyanocobalamin) .Marland Kitchen.. 1 Cc Im Q Month 14)  Wellbutrin Xl 150 Mg Xr24h-Tab (Bupropion Hcl) .Marland Kitchen.. 1po Once Daily (Generic Ok) 15)  Creon 12000 Unit Cpep (Pancrelipase (Lip-Prot-Amyl)) .Marland Kitchen.. 1-2 Tablets By Mouth Three Times A Day With Each Meal 16)  Meclizine Hcl 25 Mg Tabs (Meclizine Hcl) .... 1/2 - 1 By Mouth Q 6 Hrs As Needed Dizziness 17)  Metformin Hcl 500 Mg Tabs (Metformin Hcl) .Marland Kitchen.. 1 By Mouth Two Times A Day 18)  Zyprexa 5 Mg Tabs (Olanzapine) .Marland Kitchen.. 1po Once Daily 19)  Zofran 4 Mg Tabs (Ondansetron Hcl) .Marland Kitchen.. 1po Q 6 Hrs As Needed Nausea 20)  Flomax 0.4 Mg Caps (Tamsulosin Hcl) .Marland Kitchen.. 1 By Mouth Once Qhs 21)  Lomotil 2.5-0.025 Mg Tabs (Diphenoxylate-Atropine) .Marland Kitchen.. 1 By Mouth As Needed Loose Stool, Max 8 Tabs Per 24 Hrs 22)  Promethazine Hcl 25 Mg Tabs (Promethazine Hcl) .Marland Kitchen.. 1po Q 6 Hrs As Needed  Allergies (verified): 1)  ! Biaxin  Past History:  Past Medical History: Last updated: 06/08/2009 Diabetes mellitus, type II Hyperlipidemia Hypertension Colon cancer,hx of Vocal Cord cancer Osteoporosis Benign prostatic hypertrophy OSA chronic rotater cuff  syndrome Elevated PSA Hypothyroidism Nephrolithiasis thyroid nodule GERD presumed c diff colitis 11/08 Dementia Anemia-NOS B12 deficiency Hemorrhoids  Past Surgical History: Last updated: 07/08/2008 Cholecystectomy 1999 Inguinal herniorrhaphy 1984 Appendectomy 1998 Colon Resection 1998  Social History: Last updated: 09/26/2007 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  , severe HOH Head:  normocephalic and atraumatic.   Eyes:  vision grossly intact, pupils equal, and pupils round.   Ears:  R ear normal and L ear normal.   Nose:  no external deformity and no nasal discharge.   Mouth:  good dentition and pharynx pink and moist.   Neck:  supple and no masses.   Lungs:  normal respiratory effort and normal breath sounds.   Heart:  normal rate and regular rhythm.   Abdomen:  soft and normal bowel sounds.  with very mild epigastric tender, and mild to mod tender lower mid abd , all without guarding or rebound, but with slight abd fullness throughout and distension Msk:  no joint tenderness and no joint swelling.   Extremities:  no edema, no erythema    Impression & Recommendations:  Problem # 1:  ABDOMINAL PAIN (ICD-789.00)  lower primarily (and epigastric tenderness) , with n/v - exam most c/w constipation ;  to check urine studies , cbc repeat, lipase, and acute abd series - r/o obstruction, phenergan IM today, and by mouth to go home with;  is afeb today, does not appear feverish, and recent labs ok  - hold antibx pending lab; diff includes constipation, diverticulitis, obstruciton or other  Orders: TLB-BMP (Basic Metabolic Panel-BMET) (80048-METABOL) TLB-CBC Platelet - w/Differential (85025-CBCD) TLB-Hepatic/Liver Function Pnl (80076-HEPATIC) TLB-Lipase  (83690-LIPASE) TLB-Udip w/ Micro (81001-URINE) T-Culture, Urine (16[address]-60454) Promethazine up to 50mg  (J2550) Admin of Therapeutic Inj  intramuscular or subcutaneous (09811)  Problem # 2:  BRADYCARDIA (ICD-427.89)  His updated medication list for this problem  includes:    Adult Aspirin Low Strength 81 Mg Tbdp (Aspirin) .Marland Kitchen... Take 1 tablet by mouth once a day new, suspect related to n/v - vagal, follow closely  Problem # 3:  PERIPHERAL EDEMA (ICD-782.3)  His updated medication list for this problem includes:    Furosemide 20 Mg Tabs (Furosemide) .Marland Kitchen... Take 1 by mouth once daily as needed only for swelling to LE 's right > left, and right hand (? dependent edema) - ok to use lasix as needed but hold if n/v for now  Problem # 4:  HYPERLIPIDEMIA (ICD-272.4)  The following medications were removed from the medication list:    Lovastatin 40 Mg Tabs (Lovastatin) .Marland Kitchen... 1po once daily His updated medication list for this problem includes:    Fenofibrate 160 Mg Tabs (Fenofibrate) .Marland Kitchen... 1 by mouth qd to stop the lovastatin as may be contributing to constipation  Problem # 5:  DEMENTIA (ICD-294.8) o/w stable ;  d/w daughter it appears he would most benefit from assisted living situation at this point to assist with meds and care the wife is increasingly finding more difficult to provide  Complete Medication List: 1)  Fenofibrate 160 Mg Tabs (Fenofibrate) .Marland Kitchen.. 1 by mouth qd 2)  Azor 10-40 Mg Tabs (Amlodipine-olmesartan) .Marland Kitchen.. 1po once daily 3)  Furosemide 20 Mg Tabs (Furosemide) .... Take 1 by mouth once daily as needed only for swelling 4)  Levothroid 100 Mcg Tabs (Levothyroxine sodium) .... Take 1 tablet by mouth once a day 5)  Onetouch Test Strp (Glucose blood) .... Use 1 strip once a day - dx code 250.00 6)  Lansoprazole 30 Mg Cpdr (Lansoprazole) .Marland Kitchen.. 1 by mouth once daily 7)  Citalopram Hydrobromide 20 Mg Tabs (Citalopram hydrobromide) .Marland Kitchen.. 1 by mouth once daily 8)  Adult Aspirin Low  Strength 81 Mg Tbdp (Aspirin) .... Take 1 tablet by mouth once a day 9)  Alprazolam 0.25 Mg Tabs (Alprazolam) .Marland Kitchen.. 1by mouth  two times a day as needed - to fill May 10, 2009 10)  Proair Hfa 108 (90 Base) Mcg/act Aers (Albuterol sulfate) .... 2 puffs q 6 hrs as needed 11)  Tylenol Extra Strength 500 Mg Tabs (Acetaminophen) .... Once daily 12)  Vitamin D3 1000 Unit Tabs (Cholecalciferol) .Marland Kitchen.. 1 by mouth daily 13)  Cyanocobalamin 1000 Mcg/ml Soln (Cyanocobalamin) .Marland Kitchen.. 1 cc im q month 14)  Wellbutrin Xl 150 Mg Xr24h-tab (Bupropion hcl) .Marland Kitchen.. 1po once daily (generic ok) 15)  Creon 12000 Unit Cpep (Pancrelipase (lip-prot-amyl)) .Marland Kitchen.. 1-2 tablets by mouth three times a day with each meal 16)  Meclizine Hcl 25 Mg Tabs (Meclizine hcl) .... 1/2 - 1 by mouth q 6 hrs as needed dizziness 17)  Metformin Hcl 500 Mg Tabs (Metformin hcl) .Marland Kitchen.. 1 by mouth two times a day 18)  Zyprexa 5 Mg Tabs (Olanzapine) .Marland Kitchen.. 1po once daily 19)  Zofran 4 Mg Tabs (Ondansetron hcl) .Marland Kitchen.. 1po q 6 hrs as needed nausea 20)  Flomax 0.4 Mg Caps (Tamsulosin hcl) .Marland Kitchen.. 1 by mouth once qhs 21)  Lomotil 2.5-0.025 Mg Tabs (Diphenoxylate-atropine) .Marland Kitchen.. 1 by mouth as needed loose stool, max 8 tabs per 24 hrs 22)  Promethazine Hcl 25 Mg Tabs (Promethazine hcl) .Marland Kitchen.. 1po q 6 hrs as needed  Other Orders: T-Acute Abdomen (2 view w/ PA & Chest (21308MV)  Patient Instructions: 1)  you had the nausea shot today 2)  Please take all new medications as prescribed - the nausea medication 3)  stop the lovastatin  in case it causes the  constipation 4)  Please go to the Lab in the basement for your blood and/or urine tests today 5)  Please go to Radiology in the basement level for your X-Ray today  6)  please followup as recommended Prescriptions: PROMETHAZINE HCL 25 MG TABS (PROMETHAZINE HCL) 1po q 6 hrs as needed  #40 x 1   Entered and Authorized by:   Corwin Levins MD   Signed by:   Corwin Levins MD on 06/10/2009   Method used:   Electronically to         [facility] [phone]* (retail)       [address] [place], [place]  [address]       Ph: [phone]       Fax: [phone]   RxID:   [id number]    Medication Administration  Injection # 1:    Medication: Promethazine up to 50mg     Diagnosis: ABDOMINAL PAIN (ICD-789.00)    Route: IM    Site: RUOQ gluteus    Exp Date: 01/2011    Lot #: [id number]    Mfr: Baxter    Comments: Promethazine 25mg     Given by: Zella Ball Ewing (June 10, 2009 5:00 PM)  Orders Added: 1)  TLB-BMP (Basic Metabolic Panel-BMET) [80048-METABOL] 2)  TLB-CBC Platelet - w/Differential [85025-CBCD] 3)  TLB-Hepatic/Liver Function Pnl [80076-HEPATIC] 4)  TLB-Lipase [83690-LIPASE] 5)  TLB-Udip w/ Micro [81001-URINE] 6)  T-Culture, Urine [[id number]] 7)  Promethazine up to 50mg  [J2550] 8)  Admin of Therapeutic Inj  intramuscular or subcutaneous [96372] 9)  T-Acute Abdomen (2 view w/ PA & Chest [74022TC] 10)  Est. Patient Level IV [03474]

## 2010-04-05 NOTE — Assessment & Plan Note (Signed)
Summary: Cardiology Nuclear Study  Nuclear Med Background Indications for Stress Test: Evaluation for Ischemia   History: COPD, Echo, Myocardial Perfusion Study  History Comments: '07 MPS:low risk, small amount inferior ischemia, EF=65%; 6/07 Echo:EF=55-60%; h/o OSA  Symptoms: DOE    Nuclear Pre-Procedure Cardiac Risk Factors: Carotid Disease, Hypertension, Lipids, NIDDM, PVD Caffeine/Decaff Intake: none NPO After: 8:00 AM Lungs: Clear.  O2 Sat 98% on RA. IV 0.9% NS with Angio Cath: 22g     IV Site: (L) Forearm IV Started by: Stanton Kidney EMT-P Chest Size (in) 40     Height (in): 69.5 Weight (lb): 168 BMI: 24.54  Nuclear Med Study 1 or 2 day study:  1 day     Stress Test Type:  Eugenie Birks Reading MD:  Marca Ancona, MD     Referring MD:  Dietrich Pates, MD Resting Radionuclide:  Technetium 76m Tetrofosmin     Resting Radionuclide Dose:  11.0 mCi  Stress Radionuclide:  Technetium 4m Tetrofosmin     Stress Radionuclide Dose:  33.0 mCi   Stress Protocol   Lexiscan: 0.4 mg   Stress Test Technologist:  Rea College CMA-N     Nuclear Technologist:  Domenic Polite CNMT  Rest Procedure  Myocardial perfusion imaging was performed at rest 45 minutes following the intravenous administration of Myoview Technetium 89m Tetrofosmin.  Stress Procedure  The patient received IV Lexiscan 0.4 mg over 15-seconds.  Myoview injected at 30-seconds.  There were no significant changes with lexiscan, occasional PVC's.  Quantitative spect images were obtained after a 45 minute delay.  QPS Raw Data Images:  Mild diaphragmatic attenuation.  Normal left ventricular size. Stress Images:  Mild inferior perfusion defect.  Rest Images:  Mild inferior perfusion defect.  Subtraction (SDS):  Mild fixed inferior perfusion defect.  Transient Ischemic Dilatation:  1.01  (Normal <1.22)  Lung/Heart Ratio:  .30  (Normal <0.45)  Quantitative Gated Spect Images QGS EDV:  92 ml QGS ESV:  33 ml QGS EF:  64 % QGS  cine images:  Normal wall motion.    Overall Impression  Exercise Capacity: Lexiscan study BP Response: Hypertensive at baseline (200/60), decreased with Lexiscan.  Clinical Symptoms: Dizzy ECG Impression: No significant ST segment change suggestive of ischemia. Overall Impression: Mild fixed inferior perfusion defect with normal wall motion suggests diaphragmatic attenuation.  No evidence for ischemia or infarction.   Appended Document: Cardiology Nuclear Study Myoview is normal.  NO ischemia.  DOes not explain fatiguability.  Appended Document: Cardiology Nuclear Study Called patient's wife with results.

## 2010-04-05 NOTE — Letter (Signed)
Summary: CMN for PAP Supplies/[facility]   CMN for PAP Supplies/[facility]   Imported By: Sherian Rein 09/01/2009 08:37:30  _____________________________________________________________________  External Attachment:    Type:   Image     Comment:   External Document

## 2010-04-05 NOTE — Letter (Signed)
Summary: Exam form for housebound status/[employer]  Exam form for housebound status/[employer]   Imported By: Sherian Rein 08/04/2009 12:25:03  _____________________________________________________________________  External Attachment:    Type:   Image     Comment:   External Document

## 2010-04-05 NOTE — Letter (Signed)
Summary: [facility]   Imported By: Lester [place] 01/18/2010 09:11:05  _____________________________________________________________________  External Attachment:    Type:   Image     Comment:   External Document

## 2010-04-05 NOTE — Progress Notes (Signed)
Summary: FYI  Phone Note Call from Patient Call back at Home Phone [phone]   Caller: Spouse Summary of Call: pt spouse called to inform MD that pt was admitted to [facility] for pnemonia and will be transferred to a rehab facility today. pt's spouse would also like to inform MD that pt's iron count was low and that [facility] MD advised pt be followed for this after discharge. Initial call taken by: Margaret Pyle, CMA,  March 15, 2009 10:49 AM  Follow-up for Phone Call        noted Follow-up by: Corwin Levins MD,  March 15, 2009 5:12 PM

## 2010-04-05 NOTE — Assessment & Plan Note (Signed)
Summary: 6 month rov/sl    Referring Provider:  n/a Primary Provider:  Oliver Barre, MD  CC:  sob.  History of Present Illness: Patient is an 78 year old who is usually followed by G. Ladona Ridgel.  I saw him once in 2007, not since. the patient called in last week complaining of shortness of breath.  He was seen by Excell Seltzer.  CXR was done which showed no acute findings. Per the patient he does get short of breath.  He gives out wth walking, esp stairs.  he notes no chest pains.  Has occasional dizziness with change in position, this has not changed.  Denies syncope.  Current Medications (verified): 1)  Fenofibrate 160 Mg Tabs (Fenofibrate) .Marland Kitchen.. 1 By Mouth Qd 2)  Azor 10-40 Mg Tabs (Amlodipine-Olmesartan) .Marland Kitchen.. 1po Once Daily 3)  Furosemide 20 Mg Tabs (Furosemide) .... Take 1 By Mouth Once Daily By Mouth 5 Days Per Day (None On Wed and Sun) 4)  Levothroid 100 Mcg Tabs (Levothyroxine Sodium) .... Take 1 Tablet By Mouth Once A Day 5)  Onetouch Test  Strp (Glucose Blood) .... Use 1 Strip Once A Day - Dx Code 250.00 6)  Lansoprazole 30 Mg Cpdr (Lansoprazole) .Marland Kitchen.. 1 By Mouth Once Daily 7)  Citalopram Hydrobromide 20 Mg  Tabs (Citalopram Hydrobromide) .Marland Kitchen.. 1 By Mouth Once Daily 8)  Adult Aspirin Low Strength 81 Mg  Tbdp (Aspirin) .... Take 1 Tablet By Mouth Once A Day 9)  Alprazolam 0.25 Mg  Tabs (Alprazolam) .Marland Kitchen.. 1by Mouth  Two Times A Day As Needed - To Fill May 10, 2009 10)  Proair Hfa 108 (90 Base) Mcg/act  Aers (Albuterol Sulfate) .... 2 Puffs Q 6 Hrs As Needed 11)  Tylenol Extra Strength 500 Mg Tabs (Acetaminophen) .... Once Daily 12)  Vitamin D3 1000 Unit  Tabs (Cholecalciferol) .Marland Kitchen.. 1 By Mouth Daily 13)  Cyanocobalamin 1000 Mcg/ml Soln (Cyanocobalamin) .Marland Kitchen.. 1 Cc Im Q Month 14)  Wellbutrin Xl 150 Mg Xr24h-Tab (Bupropion Hcl) .Marland Kitchen.. 1po Once Daily (Generic Ok) 15)  Creon 12000 Unit Cpep (Pancrelipase (Lip-Prot-Amyl)) .Marland Kitchen.. 1-2 Tablets By Mouth Three Times A Day With Each Meal 16)  Meclizine Hcl 25 Mg  Tabs (Meclizine Hcl) .... 1/2 - 1 By Mouth Q 6 Hrs As Needed Dizziness 17)  Metformin Hcl 500 Mg Tabs (Metformin Hcl) .Marland Kitchen.. 1 By Mouth Two Times A Day 18)  Zyprexa 5 Mg Tabs (Olanzapine) .Marland Kitchen.. 1po Once Daily 19)  Zofran 4 Mg Tabs (Ondansetron Hcl) .Marland Kitchen.. 1po Q 6 Hrs As Needed Nausea 20)  Flomax 0.4 Mg Caps (Tamsulosin Hcl) .Marland Kitchen.. 1 By Mouth Once Qhs 21)  Lomotil 2.5-0.025 Mg Tabs (Diphenoxylate-Atropine) .Marland Kitchen.. 1 By Mouth As Needed Loose Stool, Max 8 Tabs Per 24 Hrs 22)  Promethazine Hcl 25 Mg Tabs (Promethazine Hcl) .Marland Kitchen.. 1po Q 6 Hrs As Needed  Allergies: 1)  ! Biaxin  Past History:  Past medical, surgical, family and social histories (including risk factors) reviewed, and no changes noted (except as noted below).  Past Medical History: Reviewed history from 06/08/2009 and no changes required. Diabetes mellitus, type II Hyperlipidemia Hypertension Colon cancer,hx of Vocal Cord cancer Osteoporosis Benign prostatic hypertrophy OSA chronic rotater cuff syndrome Elevated PSA Hypothyroidism Nephrolithiasis thyroid nodule GERD presumed c diff colitis 11/08 Dementia Anemia-NOS B12 deficiency Hemorrhoids  Past Surgical History: Reviewed history from 07/08/2008 and no changes required. Cholecystectomy 1999 Inguinal herniorrhaphy 1984 Appendectomy 1998 Colon Resection 1998  Family History: Reviewed history from 12/09/2008 and no changes required. Family History of  Alcoholism/Addiction Family History Diabetes 1st degree relative - mother at 62 yrs Family History of Stroke M 1st degree relative  - father died 10 yrs No FH of Colon Cancer:  Social History: Reviewed history from 09/26/2007 and no changes required. Never Smoked Alcohol use-no Married 2 children retire Geophysical data processor WWII veteran  Vital Signs:  Patient profile:   77 year old male Height:      68 inches Weight:      164 pounds BMI:     25.03 Pulse rate:   62 / minute Resp:     12 per minute BP sitting:   127 /  62  (left arm)  Vitals Entered By: Burnett Kanaris, CNA (Jul 09, 2009 1:46 PM)  Physical Exam  Additional Exam:  Patient is in NAD. HEENT:  Normocephalic, atraumatic. EOMI, PERRLA.  Neck: JVP is normal. No thyromegaly.  Lungs: clear to auscultation. No rales no wheezes.  Heart: Regular rate and rhythm. Normal S1, S2. No S3.   No significant murmurs.   Abdomen:  Supple, nontender. Normal bowel sounds. No masses. No hepatomegaly.  Extremities:   Good distal pulses throughout. No lower extremity edema.  Musculoskeletal :moving all extremities.  Neuro:   alert and oriented x3.    EKG  Procedure date:  07/09/2009  Findings:      Ventricular paced.  Impression & Recommendations:  Problem # 1:  DYSPNEA ON EXERTION (ICD-786.09) Assessment Comment Only I would recommend an adenosine myoview and an echo to evaluate.  COntinue current meds.  Orders: Echocardiogram (Echo) Nuclear Stress Test (Nuc Stress Test)  Problem # 2:  BRADYCARDIA (ICD-427.89) Patietn with pacemaker.  Problem # 3:  HYPERTENSION (ICD-401.9) Good control  Problem # 4:  HYPERLIPIDEMIA (ICD-272.4) Will need to review. His updated medication list for this problem includes:    Fenofibrate 160 Mg Tabs (Fenofibrate) .Marland Kitchen... 1 by mouth qd  Other Orders: EKG w/ Interpretation (93000)  Patient Instructions: 1)  Your physician has requested that you have an echocardiogram.  Echocardiography is a painless test that uses sound waves to create images of your heart. It provides your doctor with information about the size and shape of your heart and how well your heart's chambers and valves are working.  This procedure takes approximately one hour. There are no restrictions for this procedure. 2)  Your physician has requested that you have a dobutamine myoview.  For further information please visit [email or web address]  Please follow instruction sheet, as given.   Appended Document: 6 month rov/sl Error:  Patient does not  have pacemaker.  Was seen by Rosette Reveal for possible pacemaker

## 2010-04-05 NOTE — Assessment & Plan Note (Signed)
Summary: B-12 / Sammuel Cooper Natale Milch   Nurse Visit   Allergies: 1)  ! Biaxin  Medication Administration  Injection # 1:    Medication: Vit B12 1000 mcg    Diagnosis: VITAMIN B12 DEFICIENCY (ICD-266.2)    Route: IM    Site: R deltoid    Exp Date: 06/05/2011    Lot #: [id number]    Mfr: [employer]    Patient tolerated injection without complications    Given by: Margaret Pyle, CMA (August 12, 2009 11:32 AM)  Orders Added: 1)  Vit B12 1000 mcg [J3420] 2)  Admin of Therapeutic Inj  intramuscular or subcutaneous [45409]

## 2010-04-05 NOTE — Letter (Signed)
Summary: Diabetic Instructions  [facility]  [address] [place], [place] [address]   Phone: [phone]  Fax: [phone]    PIKE SCANTLEBURY 25-Jan-1921 MRN: [id number]   X    ORAL DIABETIC MEDICATION INSTRUCTIONS   (METFORMIN)  The day before your procedure:   Take your diabetic pill as you do normally  The day of your procedure:   Do not take your diabetic pill    We will check your blood sugar levels during the admission process and again in Recovery before discharging you home  ________________________________________________________________________  _  _   INSULIN (LONG ACTING) MEDICATION INSTRUCTIONS (Lantus, NPH, 70/30, Humulin, Novolin-N)   The day before your procedure:   Take  your regular evening dose    The day of your procedure:   Do not take your morning dose    _  _   INSULIN (SHORT ACTING) MEDICATION INSTRUCTIONS (Regular, Humulog, Novolog)   The day before your procedure:   Do not take your evening dose   The day of your procedure:   Do not take your morning dose   _  _   INSULIN PUMP MEDICATION INSTRUCTIONS  We will contact the physician managing your diabetic care for written dosage instructions for the day before your procedure and the day of your procedure.  Once we have received the instructions, we will contact you.

## 2010-04-07 NOTE — Assessment & Plan Note (Signed)
Summary: B12 Sammuel Cooper Natale Milch   Nurse Visit   Allergies: 1)  ! Biaxin  Medication Administration  Injection # 1:    Medication: Vit B12 1000 mcg    Diagnosis: VITAMIN B12 DEFICIENCY (ICD-266.2)    Route: IM    Site: R deltoid    Exp Date: 10/2011    Lot #: [id number]    Mfr: [employer]    Patient tolerated injection without complications    Given by: Zella Ball Ewing CMA (AAMA) (February 18, 2010 2:20 PM)  Orders Added: 1)  Vit B12 1000 mcg [J3420] 2)  Admin of Therapeutic Inj  intramuscular or subcutaneous [04540]
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
## 2010-04-07 NOTE — Letter (Signed)
Summary: [facility]   Imported By: Lennie Odor 03/30/2010 15:12:18  _____________________________________________________________________  External Attachment:    Type:   Image     Comment:   External Document

## 2010-04-07 NOTE — Letter (Signed)
Summary: [employer]/[facility]  [employer]/[facility]   Imported By: Sherian Rein 04/01/2010 11:27:35  _____________________________________________________________________  External Attachment:    Type:   Image     Comment:   External Document

## 2010-04-15 ENCOUNTER — Encounter: Payer: Self-pay | Admitting: Internal Medicine

## 2010-04-15 ENCOUNTER — Ambulatory Visit (INDEPENDENT_AMBULATORY_CARE_PROVIDER_SITE_OTHER): Payer: Medicare Other | Admitting: Internal Medicine

## 2010-04-15 DIAGNOSIS — N259 Disorder resulting from impaired renal tubular function, unspecified: Secondary | ICD-10-CM

## 2010-04-15 DIAGNOSIS — I10 Essential (primary) hypertension: Secondary | ICD-10-CM

## 2010-04-15 DIAGNOSIS — E119 Type 2 diabetes mellitus without complications: Secondary | ICD-10-CM

## 2010-04-15 DIAGNOSIS — R609 Edema, unspecified: Secondary | ICD-10-CM

## 2010-04-20 ENCOUNTER — Encounter: Payer: Self-pay | Admitting: Pulmonary Disease
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
## 2010-05-03 NOTE — Letter (Signed)
Summary: [facility] DME Order  / [facility]  [facility] DME Order  / [facility]   Imported By: Lennie Odor 04/28/2010 15:23:08  _____________________________________________________________________  External Attachment:    Type:   Image     Comment:   External Document

## 2010-05-17 ENCOUNTER — Telehealth: Payer: Self-pay | Admitting: Internal Medicine

## 2010-05-22 LAB — URINALYSIS, ROUTINE W REFLEX MICROSCOPIC
Glucose, UA: NEGATIVE mg/dL
Leukocytes, UA: NEGATIVE
Protein, ur: 100 mg/dL — AB
Specific Gravity, Urine: 1.017 (ref 1.005–1.030)
Urobilinogen, UA: 0.2 mg/dL (ref 0.0–1.0)

## 2010-05-22 LAB — CULTURE, BLOOD (ROUTINE X 2): Culture: NO GROWTH

## 2010-05-22 LAB — GLUCOSE, CAPILLARY
Glucose-Capillary: 105 mg/dL — ABNORMAL HIGH (ref 70–99)
Glucose-Capillary: 119 mg/dL — ABNORMAL HIGH (ref 70–99)
Glucose-Capillary: 120 mg/dL — ABNORMAL HIGH (ref 70–99)
Glucose-Capillary: 125 mg/dL — ABNORMAL HIGH (ref 70–99)
Glucose-Capillary: 127 mg/dL — ABNORMAL HIGH (ref 70–99)
Glucose-Capillary: 152 mg/dL — ABNORMAL HIGH (ref 70–99)
Glucose-Capillary: 154 mg/dL — ABNORMAL HIGH (ref 70–99)
Glucose-Capillary: 164 mg/dL — ABNORMAL HIGH (ref 70–99)
Glucose-Capillary: 81 mg/dL (ref 70–99)
Glucose-Capillary: 85 mg/dL (ref 70–99)
Glucose-Capillary: 93 mg/dL (ref 70–99)
Glucose-Capillary: 96 mg/dL (ref 70–99)
Glucose-Capillary: 99 mg/dL (ref 70–99)

## 2010-05-22 LAB — COMPREHENSIVE METABOLIC PANEL
Albumin: 3.5 g/dL (ref 3.5–5.2)
Alkaline Phosphatase: 42 U/L (ref 39–117)
BUN: 18 mg/dL (ref 6–23)
Creatinine, Ser: 1.58 mg/dL — ABNORMAL HIGH (ref 0.4–1.5)
Glucose, Bld: 161 mg/dL — ABNORMAL HIGH (ref 70–99)
Total Protein: 6.6 g/dL (ref 6.0–8.3)

## 2010-05-22 LAB — DIFFERENTIAL
Basophils Relative: 0 % (ref 0–1)
Eosinophils Relative: 0 % (ref 0–5)
Monocytes Absolute: 0.3 10*3/uL (ref 0.1–1.0)
Monocytes Relative: 3 % (ref 3–12)
Neutro Abs: 10.8 10*3/uL — ABNORMAL HIGH (ref 1.7–7.7)

## 2010-05-22 LAB — CBC
HCT: 28.8 % — ABNORMAL LOW (ref 39.0–52.0)
Hemoglobin: 9.7 g/dL — ABNORMAL LOW (ref 13.0–17.0)
MCHC: 33.5 g/dL (ref 30.0–36.0)
MCV: 96.8 fL (ref 78.0–100.0)
Platelets: 269 10*3/uL (ref 150–400)
RDW: 13.4 % (ref 11.5–15.5)

## 2010-05-22 LAB — RETICULOCYTES
RBC.: 2.94 MIL/uL — ABNORMAL LOW (ref 4.22–5.81)
Retic Count, Absolute: 38.2 10*3/uL (ref 19.0–186.0)
Retic Ct Pct: 1.3 % (ref 0.4–3.1)

## 2010-05-22 LAB — URINE MICROSCOPIC-ADD ON

## 2010-05-22 LAB — URINE CULTURE

## 2010-05-22 LAB — TSH: TSH: 1.107 u[IU]/mL (ref 0.350–4.500)

## 2010-05-22 LAB — HEMOGLOBIN A1C: Mean Plasma Glucose: 131 mg/dL

## 2010-05-22 LAB — CARDIAC PANEL(CRET KIN+CKTOT+MB+TROPI)
CK, MB: 0.6 ng/mL (ref 0.3–4.0)
Relative Index: INVALID (ref 0.0–2.5)
Troponin I: 0.02 ng/mL (ref 0.00–0.06)

## 2010-05-22 LAB — HEMOCCULT GUIAC POC 1CARD (OFFICE): Fecal Occult Bld: NEGATIVE

## 2010-05-22 LAB — IRON AND TIBC: Iron: <10 ug/dL — ABNORMAL LOW (ref 42–135)

## 2010-05-24 NOTE — Progress Notes (Signed)
Summary: FYI-edema  Phone Note From Other Clinic Call back at [phone] ext [phone]   Caller: Dr. Elmon Else (Dermatologist) Summary of Call: Dr. Emily Filbert called regarding pt's edema. Pt still has significant pitting edema bilaterally, more so in the left leg even with compression stockings. Dr. Emily Filbert is suggesting  that pt's BP med be changed to a non calcium channel blocker and that pt should have protein levels checked.  Initial call taken by: Brenton Grills CMA Duncan Dull),  May 17, 2010 12:05 PM  Follow-up for Phone Call        Nov 2011 protein levels normal  ok to change the azor to the 5/40 mg  - done per emr Follow-up by: Corwin Levins MD,  May 17, 2010 12:50 PM  Additional Follow-up for Phone Call Additional follow up Details #1::        Pt advised of BP med change Additional Follow-up by: Margaret Pyle, CMA,  May 17, 2010 3:56 PM    New/Updated Medications: AZOR 5-40 MG TABS (AMLODIPINE-OLMESARTAN) 1po once daily Prescriptions: AZOR 5-40 MG TABS (AMLODIPINE-OLMESARTAN) 1po once daily  #90 x 3   Entered and Authorized by:   Corwin Levins MD   Signed by:   Corwin Levins MD on 05/17/2010   Method used:   Electronically to        [facility] [phone]* (retail)       [address] [place], [place]  [address]       Ph: [phone]       Fax: [phone]   RxID:   [id number]

## 2010-05-25 ENCOUNTER — Encounter: Payer: Self-pay | Admitting: Internal Medicine

## 2010-05-25 ENCOUNTER — Ambulatory Visit (INDEPENDENT_AMBULATORY_CARE_PROVIDER_SITE_OTHER): Payer: Medicare Other | Admitting: Internal Medicine

## 2010-05-25 VITALS — BP 130/70 | HR 60 | Temp 97.8°F | Ht 69.0 in | Wt 175.1 lb

## 2010-05-25 DIAGNOSIS — E538 Deficiency of other specified B group vitamins: Secondary | ICD-10-CM | POA: Insufficient documentation

## 2010-05-25 DIAGNOSIS — I10 Essential (primary) hypertension: Secondary | ICD-10-CM

## 2010-05-25 DIAGNOSIS — N259 Disorder resulting from impaired renal tubular function, unspecified: Secondary | ICD-10-CM

## 2010-05-25 DIAGNOSIS — D649 Anemia, unspecified: Secondary | ICD-10-CM

## 2010-05-25 DIAGNOSIS — R609 Edema, unspecified: Secondary | ICD-10-CM

## 2010-05-25 HISTORY — DX: Deficiency of other specified B group vitamins: E53.8

## 2010-05-25 LAB — DIFFERENTIAL
Eosinophils Absolute: 0 10*3/uL (ref 0.0–0.7)
Eosinophils Relative: 0 % (ref 0–5)
Lymphocytes Relative: 6 % — ABNORMAL LOW (ref 12–46)
Lymphs Abs: 0.6 10*3/uL — ABNORMAL LOW (ref 0.7–4.0)
Monocytes Absolute: 0.2 10*3/uL (ref 0.1–1.0)

## 2010-05-25 LAB — LIPASE, BLOOD: Lipase: 55 U/L (ref 11–59)

## 2010-05-25 LAB — COMPREHENSIVE METABOLIC PANEL
ALT: 20 U/L (ref 0–53)
AST: 25 U/L (ref 0–37)
Albumin: 3.6 g/dL (ref 3.5–5.2)
CO2: 23 mEq/L (ref 19–32)
Calcium: 9.1 mg/dL (ref 8.4–10.5)
Chloride: 106 mEq/L (ref 96–112)
GFR calc Af Amer: 46 mL/min — ABNORMAL LOW (ref >60)
GFR calc non Af Amer: 38 mL/min — ABNORMAL LOW (ref >60)
Sodium: 137 mEq/L (ref 135–145)
Total Bilirubin: 0.7 mg/dL (ref 0.3–1.2)

## 2010-05-25 LAB — CBC
Platelets: 341 10*3/uL (ref 150–400)
RBC: 3.33 MIL/uL — ABNORMAL LOW (ref 4.22–5.81)
WBC: 11.2 10*3/uL — ABNORMAL HIGH (ref 4.0–10.5)

## 2010-05-25 LAB — URINE MICROSCOPIC-ADD ON

## 2010-05-25 LAB — URINALYSIS, ROUTINE W REFLEX MICROSCOPIC
Glucose, UA: NEGATIVE mg/dL
Hgb urine dipstick: NEGATIVE
Leukocytes, UA: NEGATIVE
Specific Gravity, Urine: 1.011 (ref 1.005–1.030)
pH: 7.5 (ref 5.0–8.0)

## 2010-05-25 LAB — POCT CARDIAC MARKERS
CKMB, poc: <1 ng/mL — ABNORMAL LOW (ref 1.0–8.0)
Troponin i, poc: <0.05 ng/mL (ref 0.00–0.09)

## 2010-05-25 MED ORDER — CYANOCOBALAMIN 1000 MCG/ML IJ SOLN
1000.0000 ug | Freq: Once | INTRAMUSCULAR | Status: AC
  Administered 2010-05-25: 1000 ug via INTRAMUSCULAR

## 2010-05-25 NOTE — Progress Notes (Signed)
Here to f/u;  Overall has less edema bilat  (was more left than right) with change of the azor from the 10/40 to the 5/40 strength, as well as new compression stockings;  Due to b12 shot today.  Does have some localized sweling to the irghtperiorbiatal wih rash to the bridge of the nose that is quite noticeble but he would rather use th eCPAP than stop since it makes him feel so much better the next day, and has no nocturia when he wears the mask.  Still occasionally once or twice per mo with episodes of nausea and vomiting/dry heaves that seem to resolve spontaneously, and oral or supp antiemetic dont seem to help much. Over he state "I feel the best I've felt in several months."  Has had recent labs in nov and jan and would liek to forgo labs today if possible;    Review of Systems  Constitutional: Negative for diaphoresis and unexpected weight change.  HENT: Negative for drooling and tinnitus.   Eyes: Negative for photophobia and visual disturbance.  Respiratory: Negative for choking and stridor.   Gastrointestinal: Negative for vomiting and blood in stool.  Genitourinary: Negative for hematuria and decreased urine volume.  Musculoskeletal: Negative for gait problem.  Skin: Negative for color change and wound.  Neurological: Negative for tremors and numbness.  Psychiatric/Behavioral: Negative for decreased concentration. The patient is not hyperactive.    Physical Exam  Constitutional: Pt appears well-developed and well-nourished.  HENT: Head: Normocephalic.  Right Ear: External ear normal.  Left Ear: External ear normal.  Eyes: Conjunctivae and EOM are normal. Pupils are equal, round, and reactive to light.  Neck: Normal range of motion. Neck supple.  Cardiovascular: Normal rate and regular rhythm.   Has bilat LE swelling trace tpo 1+ bilat left > right Pulmonary/Chest: Effort normal and breath sounds normal.  Neurological: He is alert. No cranial nerve deficit.  Skin: Skin is warm. No  erythema.  Psychiatric: His behavior is normal. Thought content normal.   Past Medical History  Diagnosis Date  . Diabetes mellitus type II   . Hyperlipidemia   . Hypertension   . Cancer of colon   . Cancer of vocal cord   . Osteoporosis   . Prostatic hypertrophy     benign  . OSA (obstructive sleep apnea)   . Rotator cuff syndrome     chronic  . Elevated PSA   . Hypothyroidism   . Nephrolithiasis   . Thyroid nodule   . GERD (gastroesophageal reflux disease)   . Colitis, Clostridium difficile     presumed 11/08  . Dementia   . Anemia     NOS  . Vitamin B 12 deficiency    Past Surgical History  Procedure Date  . Cholecystectomy 1999  . Inguinal herniorrhapy 1984  . Appendectomy 1998  . Colon resection 1998    reports that he has never smoked. He does not have any smokeless tobacco history on file. He reports that he does not drink alcohol. His drug history not on file. family history includes Diabetes in his mother and Stroke in his father. Allergies  Allergen Reactions  . Clarithromycin

## 2010-05-25 NOTE — Progress Notes (Deleted)
  Subjective:    Patient ID: Nathan Blackwell, male    DOB: [birth year], 77 y.o.   MRN: [id number]  HPI    Review of Systems     Objective:   Physical Exam        Assessment & Plan:

## 2010-05-25 NOTE — Assessment & Plan Note (Signed)
stable overall by hx and exam, ok to continue meds/tx as is , doing well so far with the decreased amlodipine part of the azor

## 2010-05-25 NOTE — Assessment & Plan Note (Signed)
Improved with decreased amlodpine and new stockings;  stable overall by hx and exam, ok to continue meds/tx as is ; no further eval or tx needed at this time

## 2010-05-25 NOTE — Patient Instructions (Addendum)
You had the B12 shot today Continue all other medications as before , including the azor at the 5/40 mg as you have now No lab work needed today Please return in 3 months, as we will need further labs at that time

## 2010-05-25 NOTE — Assessment & Plan Note (Signed)
stable overall by hx and exam, ok to continue meds/tx as is , recent labs reviewed with pt from nov 2011, and jan 2012; f/u with next visit labs

## 2010-05-25 NOTE — Assessment & Plan Note (Addendum)
Recent labs from nov 2011 and jan 2012 reviewed with pt, baseline cr approx 1.9;  Continue all other medications as before ; declines further labs today;  Will plan repeat next visit

## 2010-05-29 LAB — GLUCOSE, CAPILLARY: Glucose-Capillary: 88 mg/dL (ref 70–99)

## 2010-05-30 ENCOUNTER — Other Ambulatory Visit: Payer: Self-pay | Admitting: Internal Medicine

## 2010-05-30 NOTE — Telephone Encounter (Signed)
Pharmacy requesting refill on fenofibrate

## 2010-05-30 NOTE — Telephone Encounter (Signed)
To robin   

## 2010-06-06 ENCOUNTER — Other Ambulatory Visit: Payer: Self-pay | Admitting: Internal Medicine

## 2010-06-08 LAB — CBC
HCT: 30.6 % — ABNORMAL LOW (ref 39.0–52.0)
Hemoglobin: 10.5 g/dL — ABNORMAL LOW (ref 13.0–17.0)
MCHC: 34.3 g/dL (ref 30.0–36.0)
MCV: 97.5 fL (ref 78.0–100.0)
RBC: 3.14 MIL/uL — ABNORMAL LOW (ref 4.22–5.81)
RDW: 12.4 % (ref 11.5–15.5)

## 2010-06-08 LAB — COMPREHENSIVE METABOLIC PANEL
ALT: 14 U/L (ref 0–53)
BUN: 32 mg/dL — ABNORMAL HIGH (ref 6–23)
CO2: 21 mEq/L (ref 19–32)
Calcium: 9.2 mg/dL (ref 8.4–10.5)
Creatinine, Ser: 1.66 mg/dL — ABNORMAL HIGH (ref 0.4–1.5)
GFR calc non Af Amer: 39 mL/min — ABNORMAL LOW (ref >60)
Glucose, Bld: 212 mg/dL — ABNORMAL HIGH (ref 70–99)
Sodium: 135 mEq/L (ref 135–145)
Total Protein: 7 g/dL (ref 6.0–8.3)

## 2010-06-08 LAB — DIFFERENTIAL
Eosinophils Absolute: 0 10*3/uL (ref 0.0–0.7)
Lymphs Abs: 1.2 10*3/uL (ref 0.7–4.0)
Monocytes Relative: 7 % (ref 3–12)
Neutro Abs: 6.5 10*3/uL (ref 1.7–7.7)
Neutrophils Relative %: 77 % (ref 43–77)

## 2010-06-08 LAB — URINALYSIS, ROUTINE W REFLEX MICROSCOPIC
Bilirubin Urine: NEGATIVE
Nitrite: NEGATIVE
Specific Gravity, Urine: 1.015 (ref 1.005–1.030)
Urobilinogen, UA: 1 mg/dL (ref 0.0–1.0)

## 2010-06-08 LAB — POCT CARDIAC MARKERS: CKMB, poc: 1.4 ng/mL (ref 1.0–8.0)

## 2010-06-08 LAB — LIPASE, BLOOD: Lipase: 31 U/L (ref 11–59)

## 2010-06-08 LAB — URINE CULTURE: Culture: NO GROWTH

## 2010-06-08 LAB — GLUCOSE, CAPILLARY: Glucose-Capillary: 209 mg/dL — ABNORMAL HIGH (ref 70–99)

## 2010-06-13 LAB — POCT I-STAT, CHEM 8
Calcium, Ion: 1.11 mmol/L — ABNORMAL LOW (ref 1.12–1.32)
Creatinine, Ser: 1 mg/dL (ref 0.4–1.5)
Glucose, Bld: 180 mg/dL — ABNORMAL HIGH (ref 70–99)
HCT: 37 % — ABNORMAL LOW (ref 39.0–52.0)
Hemoglobin: 11.9 g/dL — ABNORMAL LOW (ref 13.0–17.0)
Hemoglobin: 12.6 g/dL — ABNORMAL LOW (ref 13.0–17.0)
Potassium: 3 mEq/L — ABNORMAL LOW (ref 3.5–5.1)
Sodium: 140 mEq/L (ref 135–145)
TCO2: 25 mmol/L (ref 0–100)

## 2010-06-13 LAB — CBC
HCT: 33.1 % — ABNORMAL LOW (ref 39.0–52.0)
Hemoglobin: 12.5 g/dL — ABNORMAL LOW (ref 13.0–17.0)
MCHC: 33.9 g/dL (ref 30.0–36.0)
MCV: 99.7 fL (ref 78.0–100.0)
Platelets: 251 10*3/uL (ref 150–400)
Platelets: 253 10*3/uL (ref 150–400)
RBC: 3.7 MIL/uL — ABNORMAL LOW (ref 4.22–5.81)
RDW: 12.8 % (ref 11.5–15.5)
RDW: 12.9 % (ref 11.5–15.5)
RDW: 13.3 % (ref 11.5–15.5)

## 2010-06-13 LAB — DIFFERENTIAL
Basophils Absolute: 0 10*3/uL (ref 0.0–0.1)
Basophils Absolute: 0 10*3/uL (ref 0.0–0.1)
Basophils Absolute: 0 10*3/uL (ref 0.0–0.1)
Basophils Relative: 0 % (ref 0–1)
Basophils Relative: 0 % (ref 0–1)
Eosinophils Absolute: 0 10*3/uL (ref 0.0–0.7)
Eosinophils Relative: 0 % (ref 0–5)
Eosinophils Relative: 1 % (ref 0–5)
Lymphocytes Relative: 20 % (ref 12–46)
Lymphocytes Relative: 8 % — ABNORMAL LOW (ref 12–46)
Monocytes Absolute: 0.3 10*3/uL (ref 0.1–1.0)
Monocytes Relative: 4 % (ref 3–12)
Neutro Abs: 4.9 10*3/uL (ref 1.7–7.7)
Neutrophils Relative %: 88 % — ABNORMAL HIGH (ref 43–77)

## 2010-06-13 LAB — BASIC METABOLIC PANEL
BUN: 20 mg/dL (ref 6–23)
Chloride: 107 mEq/L (ref 96–112)
GFR calc non Af Amer: >60 mL/min (ref >60)
Glucose, Bld: 124 mg/dL — ABNORMAL HIGH (ref 70–99)
Potassium: 3.2 mEq/L — ABNORMAL LOW (ref 3.5–5.1)

## 2010-06-13 LAB — GLUCOSE, CAPILLARY
Glucose-Capillary: 118 mg/dL — ABNORMAL HIGH (ref 70–99)
Glucose-Capillary: 124 mg/dL — ABNORMAL HIGH (ref 70–99)

## 2010-06-13 LAB — HEPATIC FUNCTION PANEL
ALT: 12 U/L (ref 0–53)
AST: 21 U/L (ref 0–37)
Albumin: 3.9 g/dL (ref 3.5–5.2)
Total Protein: 7.5 g/dL (ref 6.0–8.3)

## 2010-06-14 LAB — GLUCOSE, CAPILLARY: Glucose-Capillary: 147 mg/dL — ABNORMAL HIGH (ref 70–99)

## 2010-06-15 LAB — BASIC METABOLIC PANEL
BUN: 20 mg/dL (ref 6–23)
BUN: 34 mg/dL — ABNORMAL HIGH (ref 6–23)
CO2: 25 mEq/L (ref 19–32)
Calcium: 9.8 mg/dL (ref 8.4–10.5)
Chloride: 109 mEq/L (ref 96–112)
Creatinine, Ser: 1.4 mg/dL (ref 0.4–1.5)
Creatinine, Ser: 1.47 mg/dL (ref 0.4–1.5)
GFR calc non Af Amer: 46 mL/min — ABNORMAL LOW (ref >60)
Glucose, Bld: 139 mg/dL — ABNORMAL HIGH (ref 70–99)
Sodium: 140 mEq/L (ref 135–145)

## 2010-06-15 LAB — URINALYSIS, ROUTINE W REFLEX MICROSCOPIC
Bilirubin Urine: NEGATIVE
Hgb urine dipstick: NEGATIVE
Specific Gravity, Urine: 1.012 (ref 1.005–1.030)
Urobilinogen, UA: 0.2 mg/dL (ref 0.0–1.0)

## 2010-06-15 LAB — CULTURE, BLOOD (ROUTINE X 2): Culture: NO GROWTH

## 2010-06-15 LAB — URINE CULTURE
Culture: NO GROWTH
Special Requests: NEGATIVE

## 2010-06-15 LAB — GLUCOSE, CAPILLARY
Glucose-Capillary: 107 mg/dL — ABNORMAL HIGH (ref 70–99)
Glucose-Capillary: 165 mg/dL — ABNORMAL HIGH (ref 70–99)
Glucose-Capillary: 208 mg/dL — ABNORMAL HIGH (ref 70–99)

## 2010-06-15 LAB — CBC
MCHC: 34.3 g/dL (ref 30.0–36.0)
MCHC: 34.6 g/dL (ref 30.0–36.0)
Platelets: 232 10*3/uL (ref 150–400)
Platelets: 267 10*3/uL (ref 150–400)
RDW: 12.4 % (ref 11.5–15.5)
RDW: 12.5 % (ref 11.5–15.5)

## 2010-06-15 LAB — CK TOTAL AND CKMB (NOT AT ARMC)
CK, MB: 1.9 ng/mL (ref 0.3–4.0)
Relative Index: INVALID (ref 0.0–2.5)
Relative Index: INVALID (ref 0.0–2.5)
Total CK: 70 U/L (ref 7–232)

## 2010-06-15 LAB — TROPONIN I
Troponin I: 0.01 ng/mL (ref 0.00–0.06)
Troponin I: 0.03 ng/mL (ref 0.00–0.06)

## 2010-06-15 LAB — URINE MICROSCOPIC-ADD ON

## 2010-06-15 LAB — HEPATIC FUNCTION PANEL
ALT: 16 U/L (ref 0–53)
AST: 24 U/L (ref 0–37)
Alkaline Phosphatase: 73 U/L (ref 39–117)
Bilirubin, Direct: <0.1 mg/dL (ref 0.0–0.3)
Total Bilirubin: 0.6 mg/dL (ref 0.3–1.2)

## 2010-06-15 LAB — D-DIMER, QUANTITATIVE: D-Dimer, Quant: 0.89 ug/mL-FEU — ABNORMAL HIGH (ref 0.00–0.48)

## 2010-06-28 ENCOUNTER — Ambulatory Visit (INDEPENDENT_AMBULATORY_CARE_PROVIDER_SITE_OTHER): Payer: Medicare Other | Admitting: Internal Medicine

## 2010-06-28 ENCOUNTER — Encounter: Payer: Self-pay | Admitting: Internal Medicine

## 2010-06-28 VITALS — BP 172/58 | HR 55 | Temp 97.7°F | Ht 69.0 in | Wt 171.2 lb

## 2010-06-28 DIAGNOSIS — R5381 Other malaise: Secondary | ICD-10-CM

## 2010-06-28 DIAGNOSIS — R609 Edema, unspecified: Secondary | ICD-10-CM

## 2010-06-28 DIAGNOSIS — I498 Other specified cardiac arrhythmias: Secondary | ICD-10-CM

## 2010-06-28 DIAGNOSIS — I10 Essential (primary) hypertension: Secondary | ICD-10-CM

## 2010-06-28 DIAGNOSIS — E538 Deficiency of other specified B group vitamins: Secondary | ICD-10-CM

## 2010-06-28 DIAGNOSIS — R5383 Other fatigue: Secondary | ICD-10-CM

## 2010-06-28 MED ORDER — HYDRALAZINE HCL 25 MG PO TABS: 25.0000 mg | ORAL_TABLET | Freq: Three times a day (TID) | ORAL | Status: DC

## 2010-06-28 MED ORDER — CYANOCOBALAMIN 1000 MCG/ML IJ SOLN
1000.0000 ug | Freq: Once | INTRAMUSCULAR | Status: AC
  Administered 2010-06-28: 1000 ug via INTRAMUSCULAR

## 2010-06-28 NOTE — Patient Instructions (Addendum)
You had the b12 shot today Please return monthly for the Nurse Visit to get further b12 shots Take all new medications as prescribed - the hydralazine Continue all other medications as before Please return in 1 month;  We will need blood work that day

## 2010-06-28 NOTE — Progress Notes (Signed)
Subjective:    Patient ID: Nathan Blackwell, male    DOB: 1920-09-07, 77 y.o.   MRN: [id number]  HPI here to f/u as requested for leg swelling which is much improved since last visit with decrease of amlodipine to 5 from 10 mg, as part of his azor tx.  Has some mild L> R edema persistent, but overall much improved and Pt denies chest pain, increased sob or doe, wheezing, orthopnea, PND, increased LE swelling, palpitations, dizziness or syncope  Pt denies new neurological symptoms such as new headache, or facial or extremity weakness or numbness   Pt denies polydipsia, polyuri.  Unfortunately at the same time, BP is increased to similar as today with checking freq at home.   Pt denies fever, wt loss, night sweats, loss of appetite, or other constitutional symptoms Denies worsening depressive symptoms, suicidal ideation, or panic, though has ongoing anxiety, not increased recently.   Does have sense of ongoing fatigue, but denies signficant hypersomnolence, but has been taking frequent naps during the day.    Due for b12 today Past Medical History  Diagnosis Date  . Diabetes mellitus type II   . Hyperlipidemia   . Hypertension   . Cancer of colon   . Cancer of vocal cord   . Osteoporosis   . Prostatic hypertrophy     benign  . OSA (obstructive sleep apnea)   . Rotator cuff syndrome     chronic  . Elevated PSA   . Hypothyroidism   . Nephrolithiasis   . Thyroid nodule   . GERD (gastroesophageal reflux disease)   . Colitis, Clostridium difficile     presumed 11/08  . Dementia   . Anemia     NOS  . Vitamin B 12 deficiency   . 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 BRADYCARDIA 06/10/2009  . COLON CANCER, HX OF 09/20/2006  . HYPOTHYROIDISM 01/14/2007  . DIABETES MELLITUS, TYPE II 09/20/2006  . HYPERLIPIDEMIA 09/20/2006  . ANEMIA-NOS 06/23/2008  . DEMENTIA 05/14/2007  . DEPRESSION 02/27/2007  . SLEEP APNEA, OBSTRUCTIVE 01/14/2007  . HYPERTENSION 09/20/2006  . GERD 01/14/2007  . OSTEOPOROSIS  10/08/2006  . PERIPHERAL EDEMA 09/03/2007  . RENAL INSUFFICIENCY 04/01/2010  . B12 deficiency 05/25/2010  . CLOSTRIDIUM DIFFICILE COLITIS 01/30/2007  . THYROID NODULE 01/14/2007  . CONSTIPATION, RECURRENT 01/06/2010  . BENIGN PROSTATIC HYPERTROPHY 10/08/2006  . ARTHRITIS 02/23/2007  . BACK PAIN, THORACIC REGION 06/04/2008  . COLONIC POLYPS, HX OF 10/08/2006  . NEPHROLITHIASIS, HX OF 01/14/2007   Past Surgical History  Procedure Date  . Cholecystectomy 1999  . Inguinal herniorrhapy 1984  . Appendectomy 1998  . Colon resection 1998    reports that he has never smoked. He does not have any smokeless tobacco history on file. He reports that he does not drink alcohol. His drug history not on file. family history includes Diabetes in his mother and Stroke in his father. Allergies  Allergen Reactions  . Clarithromycin    Current Outpatient Prescriptions on File Prior to Visit  Medication Sig Dispense Refill  . ALPRAZolam (XANAX) 0.25 MG tablet Take 0.25 mg by mouth 2 (two) times daily.        Marland Kitchen amLODipine-olmesartan (AZOR) 10-40 MG per tablet Take 1 tablet by mouth daily.        Marland Kitchen aspirin 81 MG tablet Take 81 mg by mouth daily.        Marland Kitchen buPROPion (WELLBUTRIN XL) 150 MG 24 hr tablet Take 150 mg by mouth daily. Generic ok       .  Cholecalciferol (VITAMIN D3) 1000 UNIT capsule Take 1,000 Units by mouth daily.        . citalopram (CELEXA) 20 MG tablet Take 20 mg by mouth daily.        . cyanocobalamin 1000 MCG/ML injection Inject 1,000 mcg into the muscle every 30 (thirty) days.        . diphenoxylate-atropine (LOMOTIL) 2.5-0.025 MG per tablet Take 1 tablet by mouth as directed. 1 by mouth as needed loose stool, max 8 tabs per 24 hours.       . fenofibrate 160 MG tablet TAKE 1 TABLET BY MOUTH ONCE A DAY  30 tablet  11  . furosemide (LASIX) 20 MG tablet Take 20 mg by mouth daily. Take 1 by mouth daily 5 days per week (none on Wed and Sun)       . glucose blood (ONE TOUCH TEST STRIPS) test strip 1 each  by Other route as directed. Use 1 strip once a day - dx code 250.0       . lansoprazole (PREVACID) 30 MG capsule TAKE 1 CAPSULE BY MOUTH EVERY DAY  30 capsule  11  . levothyroxine (SYNTHROID, LEVOTHROID) 100 MCG tablet Take 100 mcg by mouth daily.        . meclizine (ANTIVERT) 25 MG tablet Take 25 mg by mouth as directed. 1/2 - 1 by mouth every 6 hours as needed for dizziness       . metFORMIN (GLUCOPHAGE) 500 MG tablet Take 500 mg by mouth 2 (two) times daily.        Marland Kitchen OLANZapine (ZYPREXA) 5 MG tablet Take 5 mg by mouth daily.        . ondansetron (ZOFRAN) 4 MG tablet Take 4 mg by mouth as directed. 1 po every 6 hours as needed for nausea       . Pancrelipase, Lip-Prot-Amyl, (CREON) 12000 UNIT CPEP Take by mouth 3 (three) times daily before meals.        . promethazine (PHENERGAN) 25 MG tablet Take 25 mg by mouth every 6 (six) hours as needed.        . Tamsulosin HCl (FLOMAX) 0.4 MG CAPS Take 0.4 mg by mouth at bedtime.        Marland Kitchen acetaminophen (TYLENOL) 500 MG tablet Take 500 mg by mouth every 6 (six) hours as needed.        Marland Kitchen albuterol (PROAIR HFA) 108 (90 BASE) MCG/ACT inhaler Inhale 2 puffs into the lungs every 6 (six) hours as needed.         No current facility-administered medications on file prior to visit.   Review of Systems Review of Systems  Constitutional: Negative for diaphoresis and unexpected weight change.  HENT: Negative for drooling and tinnitus.   Eyes: Negative for photophobia and visual disturbance.  Respiratory: Negative for choking and stridor.   Gastrointestinal: Negative for vomiting and blood in stool.  Genitourinary: Negative for hematuria and decreased urine volume.  Musculoskeletal: Negative for gait problem.  Skin: Negative for color change and wound.  Neurological: Negative for tremors and numbness.  Psychiatric/Behavioral: Negative for decreased concentration. The patient is not hyperactive.       Objective:   Physical Exam Physical Exam  VS  noted Constitutional: Pt appears well-developed and well-nourished.  HENT: Head: Normocephalic.  Right Ear: External ear normal.  Left Ear: External ear normal.  Eyes: Conjunctivae and EOM are normal. Pupils are equal, round, and reactive to light.  Neck: Normal range of motion. Neck supple.  Cardiovascular: Normal rate and regular rhythm.   Pulmonary/Chest: Effort normal and breath sounds normal.  Abd:  Soft, NT, non-distended, + BS Neurological: Pt is alert. No cranial nerve deficit.  Skin: Skin is warm. No erythema.  Psychiatric: Pt behavior is normal. Thought content normal.  Bilat LE edema trace L>R       Assessment & Plan:

## 2010-06-28 NOTE — Assessment & Plan Note (Signed)
For b12 IM today as he is due

## 2010-06-28 NOTE — Assessment & Plan Note (Signed)
Uncontrolled with reduction of the azor to 5/40 mg;  Bradycardia today limits choice to no beta blocker or clonidine for further control.   Will start hydralazine 25 tid,  to f/u any worsening symptoms or concerns

## 2010-06-28 NOTE — Assessment & Plan Note (Signed)
Much improved with reducing the amlodipine, now stable overall by hx and exam, most recent lab reviewed with pt, and pt to continue medical treatment as before  Lab Results  Component Value Date   WBC 7.1 04/01/2010   HGB 11.4* 04/01/2010   HCT 33.3* 04/01/2010   PLT 272.0 04/01/2010   CHOL 174 11/18/2009   TRIG 167.0* 11/18/2009   HDL 48.70 11/18/2009   LDLDIRECT 91.1 05/14/2007   ALT 15 01/06/2010   AST 26 01/06/2010   NA 140 04/01/2010   K 4.3 04/01/2010   CL 108 04/01/2010   CREATININE 1.9* 04/01/2010   BUN 28* 04/01/2010   CO2 26 04/01/2010   TSH 2.20 05/18/2009   PSA 4.88* 01/14/2007   INR 1.0 ratio 01/06/2010   HGBA1C 6.7* 11/18/2009   MICROALBUR 29.7* 05/18/2009

## 2010-06-28 NOTE — Assessment & Plan Note (Signed)
stable overall by hx and exam, asympt - cont meds as is

## 2010-06-28 NOTE — Assessment & Plan Note (Signed)
Etiology unclear, Exam otherwise benign,  follow with expectant management

## 2010-06-30 ENCOUNTER — Encounter: Payer: Self-pay | Admitting: Endocrinology

## 2010-06-30 ENCOUNTER — Ambulatory Visit (INDEPENDENT_AMBULATORY_CARE_PROVIDER_SITE_OTHER): Payer: Medicare Other | Admitting: Endocrinology

## 2010-06-30 VITALS — BP 158/76 | HR 64 | Temp 98.3°F | Ht 69.0 in | Wt 172.6 lb

## 2010-06-30 DIAGNOSIS — I10 Essential (primary) hypertension: Secondary | ICD-10-CM

## 2010-06-30 NOTE — Patient Instructions (Addendum)
Increase furosemide to 20 mg daily, every day. See if giving the hydralazine more time helps. Please call dr Jonny Ruiz if blood pressure stays high.

## 2010-06-30 NOTE — Progress Notes (Signed)
  Subjective:    Patient ID: Nathan Blackwell, male    DOB: 02-16-1921, 77 y.o.   MRN: [id number]  HPI Edema persists, but is still improved from prior to the amlodipine reduction.  he brings a record of his bp's which i have reviewed today.  Systolic is persistently 150-170.   Past Medical History  Diagnosis Date  . Diabetes mellitus type II   . Hyperlipidemia   . Hypertension   . Cancer of colon   . Cancer of vocal cord   . Osteoporosis   . Prostatic hypertrophy     benign  . OSA (obstructive sleep apnea)   . Rotator cuff syndrome     chronic  . Elevated PSA   . Hypothyroidism   . Nephrolithiasis   . Thyroid nodule   . GERD (gastroesophageal reflux disease)   . Colitis, Clostridium difficile     presumed 11/08  . Dementia   . Anemia     NOS  . Vitamin B 12 deficiency   . 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 BRADYCARDIA 06/10/2009  . COLON CANCER, HX OF 09/20/2006  . HYPOTHYROIDISM 01/14/2007  . DIABETES MELLITUS, TYPE II 09/20/2006  . HYPERLIPIDEMIA 09/20/2006  . ANEMIA-NOS 06/23/2008  . DEMENTIA 05/14/2007  . DEPRESSION 02/27/2007  . SLEEP APNEA, OBSTRUCTIVE 01/14/2007  . HYPERTENSION 09/20/2006  . GERD 01/14/2007  . OSTEOPOROSIS 10/08/2006  . PERIPHERAL EDEMA 09/03/2007  . RENAL INSUFFICIENCY 04/01/2010  . B12 deficiency 05/25/2010  . CLOSTRIDIUM DIFFICILE COLITIS 01/30/2007  . THYROID NODULE 01/14/2007  . CONSTIPATION, RECURRENT 01/06/2010  . BENIGN PROSTATIC HYPERTROPHY 10/08/2006  . ARTHRITIS 02/23/2007  . BACK PAIN, THORACIC REGION 06/04/2008  . COLONIC POLYPS, HX OF 10/08/2006  . NEPHROLITHIASIS, HX OF 01/14/2007   Past Surgical History  Procedure Date  . Cholecystectomy 1999  . Inguinal herniorrhapy 1984  . Appendectomy 1998  . Colon resection 1998    reports that he has never smoked. He does not have any smokeless tobacco history on file. He reports that he does not drink alcohol. His drug history not on file. family history includes Diabetes in his mother and  Stroke in his father. Allergies  Allergen Reactions  . Clarithromycin     Review of Systems Denies sob    Objective:   Physical Exam elderly, frail, no distress Ext:  2+ bilat leg edema       Assessment & Plan:  Htn.  i hesitate to increase rx just a few days after dr Jonny Ruiz increased. Edema, improved.

## 2010-07-19 NOTE — H&P (Signed)
NAME:  Nathan Blackwell, Nathan Blackwell NO.:  [id number]   MEDICAL RECORD NO.:  [id number]          PATIENT TYPE:  INP   LOCATION:  5125                         FACILITY:  [facility]   PHYSICIAN:  Donalynn Furlong, MD      DATE OF BIRTH:  08-24-1920   DATE OF ADMISSION:  08/31/2008  DATE OF DISCHARGE:                              HISTORY & PHYSICAL   PRIMARY CARE PHYSICIAN:  Corwin Levins, MD   CHIEF COMPLAINT:  Nausea, vomiting, abdominal pain.   HISTORY OF PRESENT ILLNESS:  Mr. Hochmuth is an 77 year old Caucasian male  who lives in [place] with his wife.  He presented to [facility] ER  yesterday afternoon after having one and a half day symptoms of nausea,  vomiting and associated abdominal pain.  Abdominal pain was diffuse with  out any localized pain.  He vomited with every food intake.  He was not  able to eat at home, and he was found to be hypertensive.  In the ER, he  denied any other GI symptoms.  He had a last bowel movement on Friday  night.  He denied passing any gas on Saturday or Sunday.  He had a large  meal at his son's home prior to started having vomiting and abdominal  pain.  He was visiting his son in [place], [place] where he started  having vomiting and abdominal pain.  He drove to [place] with his  wife.  Vomiting has no blood in it.  It is nonbilious.  No aggravating  or alleviating factor.  He denies any fever, chills, headaches,  myalgias, rash at this time.  He denies any sick contacts.  The patient  has a history of abdominal surgery with partial right hemicolectomy for  colon cancer in the past.   PAST MEDICAL HISTORY:  1. Cholecystectomy.  2. Inguinal herniorrhaphy.  3. Appendectomy.  4. Diabetes mellitus.  5. Hyperlipidemia.  6. Hypertension.  7. Colon cancer.  8. Colon polyps.  9. Osteoporosis.  10.Hypothyroidism.  11.BPH.  12.Sleep apnea on CPAP.  13.Rotator cuff syndrome.  14.Kidney stones.  15.Gastroesophageal reflux disease.  16.History of C diff colitis.  17.Dementia.  18.Depression.  19.Osteoarthritis.  20.Vitamin B12 deficiency.   HOME MEDICATION:  1. Gemfibrozil 600 mg twice a day.  2. Actonel 35 mg once a week.  3. Avapro 300 mg once a day.  4. Felodipine 100 mg daily.  5. Furosemide 20 mg daily.  6. Levothyroxine 100 mcg daily.  7. Lovastatin 40 mg daily.  8. Metformin 500 mg two tablets twice a day.  9. Citalopram 20 mg daily.  10.Adult aspirin low strength 81 mg daily.  11.Meclizine 25 mg as needed.  12.Alprazolam 0.25 mg twice a day as needed.  13.ProAir two puffs q.6 h p.r.n.  14.Imodium 2 mg tablet as needed.  15.Tylenol 100 mg once a day.  16.Darvocet 100/650 mg by mouth four times a day as needed for pain.  17.Vitamin D3 1000 units one pill daily.  18.Vitamin B12 1000 mcg one tablet daily.   FAMILY HISTORY:  Alcohol addiction.  Family history of  diabetes mellitus  and stroke.   SOCIAL HISTORY:  No smoking history.  No alcohol use.  He is married  with two children.  He is a World War II veteran.   REVIEW OF SYSTEMS:  Positive as per HPI, otherwise negative.  Review of  systems done for 14 systems.   PHYSICAL EXAMINATION:  VITAL SIGNS:  Blood pressure 200/74, pulse of 59,  respiration 18, temperature 97.8.  GENERAL:  Alert, oriented x3, laying in bed.  CARDIOVASCULAR:  S1, S2 regular.  No murmur or gallop.  LUNGS:  Clear to auscultation bilaterally.  No wheezing, rhonchi,  crackles.  ABDOMEN:  Nontender, nondistended.  Bowel sounds present.  The patient  has a scar from previous surgery.  The patient denies any tenderness on  palpation.  The patient has good bowel sounds also.  EXTREMITIES:  No clubbing, cyanosis or edema.  Pulses palpable in all  four extremities.  HEENT:  Normocephalic nontraumatic.  Eyes:  Pupils react to light and  accommodation.  Extraocular muscles intact.  Oral cavity:  Oral mucosa  is dry.  No thrush noted.  NECK:  No thyromegaly or JVD.  SKIN:  No  rash or bruits.   LABORATORY DATA:  CBC with differential, WBC 5.5, hemoglobin 11.9,  platelets 253.  Abdominal x-ray shows bibasilar atelectasis or scarring  with a few air-fluid levels in the small bowel over the left midabdomen  suggestive of nonspecific versus partial small-bowel obstruction versus  focal ileus.  Potassium 3.0, glucose 162, lipase 33, liver enzymes and  LFTs normal, lactic acid 1.29.  CT abdomen, pelvis with no acute  abdominal or pelvic process.  Hiatal hernia visible.   ASSESSMENT AND PLAN:  1. Nausea, vomiting, abdominal pain consistent with gastroenteritis.      No small bowel obstruction noted on CT scan exam today.  The      patient has been clinically improved without any nausea, vomiting      and no abdominal exam pain at this time.  2. History of obstructive sleep apnea on CPAP.  3. History of hyperlipidemia.  4. History of osteoporosis.  5. History of hypertension.  6. History of hypothyroidism.  7. History of gastroesophageal reflux disease.  8. History of diabetes mellitus.  9. Uncontrolled hypertension.  10.Depression.  11.History of colon cancer status post partial right hemicolectomy.   PLAN:  Will admit the patient on a medical bed under [facility]  with a diagnosis of gastroenteritis, vomiting, abdominal pain.  The  patient will be DNR.  The patient's code was discussed with him, he  wants to be DNR.   The patient will be n.p.o. except medications, ice chips and water at  this time.  We will progress to liquid diabetic diet in the morning.  We  will start IV normal saline at 50 mL per hour for 1 liter.  Will check  CBC with differential, CMP the morning.  We will continue a  levothyroxine, ProAir and other medications at home dose.  Will provide  Tylenol, Zofran p.r.n. for fever, pain and nausea, respectively.  We  will check sliding scale insulin with a.c. h.s. with scale one in the  morning.  The patient should be stable to go home  tomorrow if he is able  to tolerate his food and have a bowel movement.      Donalynn Furlong, MD  Electronically Signed     Donalynn Furlong, MD  Electronically Signed    TVP/MEDQ  D:  08/31/2008  T:  08/31/2008  Job:  [id number]   cc:   Corwin Levins, MD

## 2010-07-19 NOTE — Assessment & Plan Note (Signed)
[facility]                         ELECTROPHYSIOLOGY OFFICE NOTE   NAME:Nathan Blackwell, Nathan Blackwell                       MRN:          [id number]  DATE:12/05/2006                            DOB:          Aug 31, 1920    Nathan Blackwell returns today for followup.  He is a very pleasant elderly  male with a history of sinus bradycardia and hypertension, whom I saw  initially a year ago.  At that time, he was referred for bradycardia to  discuss whether or not pacemaker implantation would be warranted.  At  that time, his heart rate got up into the high 80s.  However, his  history was consistent with obstructive sleep apnea and he ultimately  underwent sleep apnea monitoring and is now on CPAP.  Despite this, he  does not note much improvement in his energy level.  He returns today  for followup.  There has been no syncope in the interim.   PHYSICAL EXAM:  He is a pleasant, well-appearing, elderly man, in no  acute distress.  Blood pressure is 142/70, the pulse 60 and regular, respirations are 16,  the weight was 176 pounds.  He does note generalized fatigue.  LUNGS:  Clear bilaterally to auscultation, no wheezes, rales or rhonchi  are present.  CARDIOVASCULAR EXAM:  Revealed a regular rate and rhythm with normal S1  and S2.  The PMI was not enlarged, nor was it displaced.  ABDOMINAL EXAM:  Soft and nontender.  There is no organomegaly.  EXTREMITIES:  Demonstrated no cyanosis, clubbing or edema.  Pulses were  2+ and symmetric.  NEUROLOGIC EXAM:  He was alert and oriented times three.  His cranial  nerves were intact.   EKG demonstrates sinus rhythm with junctional escape beats competing  with his sinus rhythm.   IMPRESSION:  1. Sinus bradycardia.  2. Hypertension.  3. Diabetes.   DISCUSSION:  Overall, Nathan Blackwell is stable.  His bradycardia is  asymptomatic.  I recommend a period of watchful waiting and continuation  of his CPAP.  If he were to pass out or have  other symptoms, then we  would consider pacemaker.  I will see him back in a year.     Doylene Canning. Ladona Ridgel, MD  Electronically Signed    GWT/MedQ  DD: 12/05/2006  DT: 12/05/2006  Job #: [id number]

## 2010-07-19 NOTE — Discharge Summary (Signed)
NAME:  Nathan Blackwell, Nathan Blackwell NO.:  [id number]   MEDICAL RECORD NO.:  [id number]          PATIENT TYPE:  INP   LOCATION:  5125                         FACILITY:  [facility]   PHYSICIAN:  Titus Dubin. Hopper, MD,FACP,FCCPDATE OF BIRTH:  09-15-22   DATE OF ADMISSION:  08/30/2008  DATE OF DISCHARGE:  09/01/2008                               DISCHARGE SUMMARY   ADMITTING DIAGNOSES:  1. Nausea, vomiting, and abdominal pain consistent with      gastroenteritis.  2. History of sleep apnea, on CPAP.  3. Hyperlipidemia.  4. Osteoporosis.  5. Hypertension.  6. Hypothyroidism.  7. Gastroesophageal reflux.  8. Diabetes.  9. Hypertension.  10.Depression.  11.Past history of colon cancer, status post right hemicolectomy.   DISCHARGE DIAGNOSES:  1. Nausea, vomiting, and abdominal pain consistent with      gastroenteritis.  2. History of sleep apnea, on CPAP.  3. Hyperlipidemia.  4. Osteoporosis.  5. Hypertension.  6. Hypothyroidism.  7. Gastroesophageal reflux.  8. Diabetes.  9. Hypertension.  10.Depression.  11.Past history of colon cancer, status post right hemicolectomy.  12.Gastroenteritis, resolved.  13.Hypokalemia, improving.   For details of history and physical, please see the typed note by Dr.  Joeseph Amor of June 28.  He began to have nausea and vomiting the day  prior to admission associated with abdominal pain, which was diffuse and  nonlocalized.  He was unable to take any solid food in at home.   In the emergency room, white count was 5500, hemoglobin 11.9, and  platelets 252,000.  He had bibasilar atelectasis or scarring.  Fluid  levels in the small bowel over the left mid abdomen suggesting  nonspecific versus partial small bowel obstruction versus focal ileus.  Potassium was 3 , glucose 162,  Lipase 33 & hepatic  enzymes were  normal.  CT of the abdomen and pelvis revealed no acute process except  for incidental  hiatal hernia.  He was treated with  parenteral fluids and IV meds for nausea and  vomiting.   He was actually admitted to the floor at 12:15 on June 28.  He was  checked approximately 8 o'clock on August 31, 2008 At that time he denied  abdominal pain, nausea, or vomiting.   Runs of potassium chloride were initiated in addition to the potassium  chloride in his maintenance IV fluids.  His repeat potassium was 3.2.  GFR was over 60 and creatinine was 1.14.  Follow up white count was  still 5800 and hemoglobin 11.5.   The day of discharge, the patient denied any abdominal pain or nausea or  vomiting.   He was afebrile with a pulse of 58, respiratory rate  18, and blood  pressure 152/64.  O2 sats were 95% on room air.   His abdomen was soft with no tenderness.  There was no pain to even  percussion of the abdomen.  He had regular rhythm.  He was alert and  oriented.   Serial glucoses had ranged from 88-149.   His gastroenteritis was felt to have  resolved.   He was discharged on  clear liquids, advancing to solids as tolerated.  He was to avoid milk and dairy for least 48-72 hours following  discharge.   He was to see  his primary physician in 5-7 days or sooner should the  symptoms recur.   He was to hold his Actonel and metformin until seen.   He was to check a BUN, creatinine, potassium, and fasting glucose the  morning of 09/07/2008, at the [employer] (codes 276.8 and 250.00).   No changes were made in his home meds of;  1. Lasix 20 mg daily.  2. Avapro 300 mg daily.  3. Gemfibrozil 600 mg twice a day.  4. Creon 12 mg EC 1-2 capsules 3 times a day with each meal.  5. Felodipine ER 10 mg daily.  6. Ranitidine 300 mg daily.  7. Levothroid 100 mcg daily.  8. Lansoprazole 30 mg daily.  9. Citalopram 30 mg daily.  10.Alprazolam 0.25 mg 2 times daily as needed.  11.Lovastatin 40 mg daily at bedtime.  12.Budeprion XL 150 mg daily.  13.Potassium chloride 10 mEq daily was prescribed, dispense 10.   His office  record was reviewed ; TSH was 5.46 on April 9.  No change was  made in his Synthroid dose.   DISCHARGE STATUS:  Improved.   PROGNOSIS:  Appears to be good at this time.       Titus Dubin. Alwyn Ren, MD,FACP,FCCP  Electronically Signed     WFH/MEDQ  D:  09/01/2008  T:  09/01/2008  Job:  [id number]   cc:   Corwin Levins, MD

## 2010-07-19 NOTE — Assessment & Plan Note (Signed)
[facility]                         ELECTROPHYSIOLOGY OFFICE NOTE   NAME:Rapley, SIMCHA SPEIR                       MRN:          [id number]  DATE:12/18/2007                            DOB:          [birth year]    Mr. Roylance returns today for followup.  He is a very pleasant elderly  man with asymptomatic bradycardia and hypertension and frequent falls.  He also has some of sleep apnea and very mild COPD who returns today for  followup.  He has had what sounds like two falls in the last year.  Both  occurred while walking, both when he got going too fast and could not  slow down.  He returns today for followup.  He denies any symptoms  occurring when he is at rest or sitting or when he is standing.   MEDICATIONS:  1. Actonel 35 mg weekly.  2. Aspirin 81 a day.  3. Avapro 30 mg a day.  4. Felodipine 10 mg a day.  5. Lasix 20 mg half-tablet daily.  6. Synthroid 100 mg daily.  7. Lovastatin 40 a day.  8. Metformin 500 twice a day.  9. Zantac 300 daily.  10.Multivitamin.   PHYSICAL EXAMINATION:  GENERAL:  He is a pleasant well-appearing elderly  man in no acute distress.  VITAL SIGNS:  Blood pressure today was 180/70, the pulse was 58 and  regular, respirations were 18, and the weight was 175 pounds.  NECK:  No jugular venous distention.  LUNGS:  Clear bilaterally to auscultation.  No wheezes, rales or rhonchi  are present.  No increased work of breathing was there.  CARDIOVASCULAR:  Regular rate and rhythm.  Normal S1 and S2.  There is a  soft S4 gallop present.  ABDOMEN:  Soft, nontender.  EXTREMITIES:  1+ peripheral edema bilaterally.   IMPRESSION:  1. Recurrent falls, now stable.  2. Asymptomatic bradycardia.  3. Hypertension.  4. Chronic peripheral edema.   DISCUSSION:  Mr. Daman is stable.  I have encouraged him to continue to  remain active and try to get find an exercise bike so that he does not  continue walking and falling.  I will  plan to see the patient back in  follow up in 1 year.     Doylene Canning. Ladona Ridgel, MD  Electronically Signed    GWT/MedQ  DD: 12/18/2007  DT: 12/19/2007  Job #: [id number]

## 2010-07-19 NOTE — Discharge Summary (Signed)
NAME:  Nathan Blackwell, Nathan Blackwell                ACCOUNT NO.:  [id number]   MEDICAL RECORD NO.:  [id number]          PATIENT TYPE:  INP   LOCATION:  1423                         FACILITY:  [facility]   PHYSICIAN:  Bruce Rexene Edison. Swords, MD    DATE OF BIRTH:  [birth year]   DATE OF ADMISSION:  01/23/2007  DATE OF DISCHARGE:  01/27/2007                               DISCHARGE SUMMARY   DISCHARGE DIAGNOSES:  1. Dehydration, unclear etiology, possibly related to light headedness      and pre-syncope. The patient treated with IV fluids.  2. Acute bronchitis. The patient was treated with Clarithromycin.  3. Hypertension.  4. Diabetes.  5. Hyperlipidemia.  6. History of colon cancer.  7. History of vocal cord cancer.  8. Osteoporosis.  9. Benign prostatic hypertrophy.  10.Obstructive sleep apnea.  11.Chronic rotator cuff syndrome.  12.Hypothyroidism.  13.Nephrolithiasis.  14.History of thyroid nodule.  15.Gastroesophageal reflux disease.  16.Cholecystectomy.  17.Inguinal herniorrhaphy.  18.Appendectomy.   DISCHARGE MEDICATIONS:  1. Gemfibrozil 600 mg b.i.d.  2. Actonel 35 mg weekly.  3. Avapro 300 mg 1/2 tablet p.o. daily.  4. Felodipine 10 mg 1/2 tablet p.o. daily.  5. Flomax, discontinue.  6. Lasix 20 mg 1/2 tablet daily.  7. Citalopram 10 mg p.o. daily.  8. Levothyroxine 100 mcg p.o. daily.  9. Lovastatin 40 mg p.o. daily.  10.Metformin 500 mg b.i.d.  11.Ranitidine 300 mg p.o. daily.  12.Aspirin 81 mg p.o. daily.  13.Flagyl 250 mg 4 times daily for 12 days.  14.Florastor 1 p.o. b.i.d. for 14 days.   CONSULTATIONS:  Gastroenterology. Recommended empiric treatment for  C.Diff colitis.   PROCEDURE:  1. CT of the chest demonstrated no pulmonary emboli.  2. CT of the abdomen demonstrated air fluid level within the colon,      suggestive of chronic colitis, possible ulcerative colitis,      possible ischemic colitis.   LABORATORY DATA:  Magnesium 2.0. Cardiac panel normal. B-met normal  except  for a chloride of 114 and a calcium of 8.2. C.Diff toxin  negative. Urine culture, no growth to date.   HOSPITAL COURSE BY PROBLEM:  1. DEHYDRATION AND LIGHT HEADEDNESS:  The patient admitted to feeling      dizzy on admission. The patient treated with IV fluids. He felt      better during the hospitalization. He felt well ambulating at the      time of discharge. __________ blood pressure medications and      Flomax. I'll back off on the dose of Avapro and Felodipine. Will      discontinue Felodipine for the time being. His blood pressure will      need to be followed.  2. HYPERTENSION:  See above. Blood pressure as an outpatient will need      to be followed.  3. DIARRHEA:  The patient had significant diarrhea while in the      hospital. Abnormal CT scan noted. The patient seen in consultation      by gastroenterology. They thought it best that he be treated      empirically for  C.Diff colitis. Symptoms resolve quickly while on      Flagyl. Will continue Flagyl for a total of 14 days and will add      Florastor at the time of discharge.  4. DIZZINESS AND PRE-SYNCOPE:  The patient admitted with those      symptoms. The patient admitted to telemetry and ruled out for      myocardial infarction. It is worth noting that on the morning of      discharge, while sleeping, the patient had what appears to be what      a slow, wide-complex, run. It seems to be a ventricularly paced      rhythm at a rate of approximately 50. The patient was asymptomatic.      I do not think any further evaluation is necessary at this time. If      his dizziness persists despite decreasing his blood pressure      medications, he may need further cardiac evaluation.  5. Other medical problems seem to be stable while in the hospital.   DISPOSITION:  The patient is discharged home in good condition and is  able to tolerate an adequate diet and is ambulating without difficulty.      Bruce Rexene Edison Swords, MD   Electronically Signed     BHS/MEDQ  D:  01/27/2007  T:  01/27/2007  Job:  [id number]

## 2010-07-19 NOTE — Discharge Summary (Signed)
NAME:  KENNIEL, BERGSMA                ACCOUNT NO.:  [id number]   MEDICAL RECORD NO.:  [id number]          PATIENT TYPE:  INP   LOCATION:  1416                         FACILITY:  [facility]   PHYSICIAN:  Valerie A. Felicity Coyer, MDDATE OF BIRTH:  November 22, 1920   DATE OF ADMISSION:  06/12/2008  DATE OF DISCHARGE:  06/14/2008                               DISCHARGE SUMMARY   .   DISCHARGE DIAGNOSES:  1. Abdominal pain, no specific abnormality identified.  See details      below. Symptoms resolved.  2. Dyspnea prior to admission without recurrence this hospitalization.      No hypoxia.  Her tachycardia outpatient follow-up as needed.  3. Type 2 diabetes.  4. Hypertension.  5. Mild normocytic anemia. Discharge hemoglobin 10.3. No clinical      evidence of bleeding.  6. Mild acute renal insufficiency. Discharge creatinine of 1.4.  7. Chronic low back pain.  8. History of depression.  9. History of gait disorder with B12 deficiency.  10.Hypothyroidism.  11.Benign prostatic hypertrophy with history of increased prostate-      specific antigen. Outpatient follow-up with urology tomorrow as      scheduled.  12.Dyslipidemia.  13.Gastroesophageal reflux disease.   Discharge medications are as prior to admission without change and  include  1. Lopid 600 mg p.o. b.i.d.  2. Actonel 35 mg once weekly.  3. Avapro 300 mg daily.  4. Felodipine 10 mg daily.  5. Lasix 20 mg daily.  6. Synthroid 100 mcg daily.  7. Lovastatin 40 mg daily.  8. Metformin 500 mg 2 tablets p.o. b.i.d. to hold until a.m. June 15, 2008 and then resume as previously scheduled following contrast      administration on April 10.  9. Ranitidine 300 mg p.o. daily.  10.Celexa 20 mg daily.  11.Aspirin 81 mg daily.  12.Meclizine 25 mg as needed for dizziness.  13.Xanax 0.25 mg b.i.d. p.r.n. anxiety.  14.ProAir 2 puffs q.6 h. p.r.n. shortness of breath.  15.Imodium AD 2 mg p.o. q.4 h. p.r.n. loose stool.  16.Darvocet N 100 mg up  to 4 times daily as needed for moderate pain.  17.Vitamin D3 1000 units daily.  18.Vitamin B12 1000 mg daily.  19.Tylenol Extra Strength 500 mg daily and as needed for arthritis      pain.   DISPOSITION:  The patient is discharged home where he lives with his  wife in medically and stable improved condition.  He has had no further  abdominal pain or complaints of dyspnea since prior to admission. He was  advanced to a regular carb modified diet without complications and no  recurrence of pain.  He has had two soft bowel movements per his routine  and normal bowel movements at home following 4 days of constipation  prior to admission.  Hospital follow-up will be primary care physician  Dr. Oliver Barre to call in the next 2 weeks for routine follow-up or as  needed.  Also with urologist at [facility] neurology tomorrow,  June 16, 1999, as previously scheduled.   CONDITION ON DISCHARGE:  Medically improved without complaints and  stable for discharge home.   HOSPITAL COURSE BY PROBLEM:  Abdominal pain with dyspnea.  The patient  is an 77 year old gentleman with multiple medical problems who came to  his primary care physician's office on the day of admission with vague  and nonspecific complaints of lower abdominal pain and increasing  shortness of breath for the last 2 days prior to admission. Overall  feeling sick because of his overall fatigue, elderly state, and  coexisting nausea with concern of inability to take p.o. medications.  He was admitted for IV fluids, empiric IV antibiotics, and further  inpatient evaluation.  He was monitored on telemetry where there was no  evidence of arrhythmia.  Serial cardiac enzymes were negative for  atypical angina as cause of his symptoms.  Urinalysis was unremarkable  and LFTs were normal.  The patient was afebrile and had a normal white  count and overall his symptoms were much improved after gentle IV fluid  hydration.  A CT abdomen  was performed on June 13, 2008 which was also  normal for any intra-abdominal source of infection or disease.  He was  advanced to a regular diet following his CT and after having 2 bowel  movements which reportedly relieved his preexisting constipation, the  patient's symptoms were markedly improved.  He reports he often has  loose stools at home and the constipation had been irregular. He is  feeling better and anxious for discharge home. A CT chest angio to  exclude PE was not pursued given his elevated creatinine at the time of  discharge and the lack of any persisting pain symptoms, dyspnea, or  hypoxia.  He has been 100% on room air without tachycardia.  No leg  swelling and without clinical need to further pursue evaluation at this  time. His other chronic medical issues have been stable, and he is to  resume his home medications for these issues as prior to admission  without change.  He will follow up with his urologist tomorrow as  previously scheduled for his BPH symptoms with elevated PSA and with his  primary care physician, Dr. Jonny Ruiz, in 2 weeks or as needed.      Valerie A. Felicity Coyer, MD  Electronically Signed     VAL/MEDQ  D:  06/14/2008  T:  06/14/2008  Job:  [id number]

## 2010-07-22 ENCOUNTER — Ambulatory Visit (INDEPENDENT_AMBULATORY_CARE_PROVIDER_SITE_OTHER): Payer: Medicare Other | Admitting: Internal Medicine

## 2010-07-22 ENCOUNTER — Encounter: Payer: Self-pay | Admitting: Internal Medicine

## 2010-07-22 ENCOUNTER — Inpatient Hospital Stay (HOSPITAL_COMMUNITY)
Admission: EM | Admit: 2010-07-22 | Discharge: 2010-07-24 | DRG: 394 | Disposition: A | Discharge status: Home / Self Care | Payer: Medicare Other | Attending: Internal Medicine | Admitting: Internal Medicine

## 2010-07-22 VITALS — BP 132/62 | HR 63 | Temp 97.9°F | Ht 68.0 in | Wt 169.5 lb

## 2010-07-22 DIAGNOSIS — E785 Hyperlipidemia, unspecified: Secondary | ICD-10-CM | POA: Diagnosis present

## 2010-07-22 DIAGNOSIS — M810 Age-related osteoporosis without current pathological fracture: Secondary | ICD-10-CM | POA: Diagnosis present

## 2010-07-22 DIAGNOSIS — F068 Other specified mental disorders due to known physiological condition: Secondary | ICD-10-CM | POA: Diagnosis present

## 2010-07-22 DIAGNOSIS — D631 Anemia in chronic kidney disease: Secondary | ICD-10-CM | POA: Diagnosis present

## 2010-07-22 DIAGNOSIS — Z87442 Personal history of urinary calculi: Secondary | ICD-10-CM

## 2010-07-22 DIAGNOSIS — F329 Major depressive disorder, single episode, unspecified: Secondary | ICD-10-CM | POA: Diagnosis present

## 2010-07-22 DIAGNOSIS — K648 Other hemorrhoids: Principal | ICD-10-CM | POA: Diagnosis present

## 2010-07-22 DIAGNOSIS — N183 Chronic kidney disease, stage 3 unspecified: Secondary | ICD-10-CM | POA: Diagnosis present

## 2010-07-22 DIAGNOSIS — K922 Gastrointestinal hemorrhage, unspecified: Secondary | ICD-10-CM

## 2010-07-22 DIAGNOSIS — D62 Acute posthemorrhagic anemia: Secondary | ICD-10-CM | POA: Diagnosis present

## 2010-07-22 DIAGNOSIS — Z85038 Personal history of other malignant neoplasm of large intestine: Secondary | ICD-10-CM

## 2010-07-22 DIAGNOSIS — Z79899 Other long term (current) drug therapy: Secondary | ICD-10-CM

## 2010-07-22 DIAGNOSIS — Z9049 Acquired absence of other specified parts of digestive tract: Secondary | ICD-10-CM

## 2010-07-22 DIAGNOSIS — N400 Enlarged prostate without lower urinary tract symptoms: Secondary | ICD-10-CM | POA: Diagnosis present

## 2010-07-22 DIAGNOSIS — Z7982 Long term (current) use of aspirin: Secondary | ICD-10-CM

## 2010-07-22 DIAGNOSIS — I129 Hypertensive chronic kidney disease with stage 1 through stage 4 chronic kidney disease, or unspecified chronic kidney disease: Secondary | ICD-10-CM | POA: Diagnosis present

## 2010-07-22 DIAGNOSIS — E039 Hypothyroidism, unspecified: Secondary | ICD-10-CM | POA: Diagnosis present

## 2010-07-22 DIAGNOSIS — G4730 Sleep apnea, unspecified: Secondary | ICD-10-CM | POA: Diagnosis present

## 2010-07-22 DIAGNOSIS — E119 Type 2 diabetes mellitus without complications: Secondary | ICD-10-CM | POA: Diagnosis present

## 2010-07-22 DIAGNOSIS — F3289 Other specified depressive episodes: Secondary | ICD-10-CM | POA: Diagnosis present

## 2010-07-22 DIAGNOSIS — K219 Gastro-esophageal reflux disease without esophagitis: Secondary | ICD-10-CM | POA: Diagnosis present

## 2010-07-22 LAB — URINALYSIS, ROUTINE W REFLEX MICROSCOPIC
Bilirubin Urine: NEGATIVE
Glucose, UA: NEGATIVE mg/dL
Hgb urine dipstick: NEGATIVE
Ketones, ur: NEGATIVE mg/dL
Protein, ur: NEGATIVE mg/dL
Urobilinogen, UA: 0.2 mg/dL (ref 0.0–1.0)

## 2010-07-22 LAB — DIFFERENTIAL
Eosinophils Relative: 4 % (ref 0–5)
Lymphocytes Relative: 27 % (ref 12–46)
Lymphs Abs: 1.8 10*3/uL (ref 0.7–4.0)
Monocytes Absolute: 0.5 10*3/uL (ref 0.1–1.0)
Neutro Abs: 4.1 10*3/uL (ref 1.7–7.7)

## 2010-07-22 LAB — COMPREHENSIVE METABOLIC PANEL
ALT: 20 U/L (ref 0–53)
Albumin: 4.2 g/dL (ref 3.5–5.2)
Alkaline Phosphatase: 39 U/L (ref 39–117)
BUN: 30 mg/dL — ABNORMAL HIGH (ref 6–23)
Chloride: 100 mEq/L (ref 96–112)
Glucose, Bld: 101 mg/dL — ABNORMAL HIGH (ref 70–99)
Potassium: 4 mEq/L (ref 3.5–5.1)
Sodium: 139 mEq/L (ref 135–145)
Total Bilirubin: 0.2 mg/dL — ABNORMAL LOW (ref 0.3–1.2)
Total Protein: 7.4 g/dL (ref 6.0–8.3)

## 2010-07-22 LAB — CBC
HCT: 34 % — ABNORMAL LOW (ref 39.0–52.0)
Hemoglobin: 11.2 g/dL — ABNORMAL LOW (ref 13.0–17.0)
MCHC: 32.9 g/dL (ref 30.0–36.0)
MCV: 96.3 fL (ref 78.0–100.0)
RDW: 13.2 % (ref 11.5–15.5)

## 2010-07-22 LAB — APTT: aPTT: 27 seconds (ref 24–37)

## 2010-07-22 LAB — SAMPLE TO BLOOD BANK

## 2010-07-22 NOTE — Assessment & Plan Note (Signed)
[facility]                              CARDIOLOGY OFFICE NOTE   NAME:Deitrick, GLENROY CROSSEN                       MRN:          [id number]  DATE:10/23/2005                            DOB:          Aug 13, 1920    IDENTIFICATION:  Mr. Saling is an 77 year old gentleman whom I saw back on  July 16 for evaluation of dizziness.  Refer to this for full details.   After I saw him, I scheduled an exercise Myoview.  He exercised for 4  minutes 15 seconds, stopped because of dyspnea.  Peak heart rate was 122,  which was 89% predicted maximum.  Myoview scan showed some inferior  thinning, most likely soft tissue attenuation very distally, with small  region of improvement but overall felt to be a low-risk scan.  Probable soft  tissue attenuation.  Again, the apical changes were trivial.  EF was 65%.   The patient also had an echocardiogram done that showed normal LV function.  The aortic valve was mildly thickened, no stenosis.   In addition, the patient had a Holter monitor placed that showed some  automatic idioventricular rhythm, a rate about 50.  Did not have symptoms at  the time with this but concerning that maybe this was associated.  Average  heart rate was 54 beats per minute.  There were no pauses, rare PVCs, rare  PACs.   Since seeing the patient, still has had spells of dizziness, a couple of  episodes where he had to sit down quickly or put his head forward.  Again,  one occurred on stairs.  A couple have occurred with bending and lifting.  No syncope.   Otherwise, he still feels fatigued.  York Spaniel it really began back since he has  had his chemotherapy back in 2000.  Wife says it is getting worse.   CURRENT MEDICATIONS:  1. Actonel 35 mg each week.  2. Aspirin 81 mg daily.  3. Avapro 300 mg daily.  4. Felodipine 10 mg daily.  5. Lasix 10 mg daily.  6. Synthroid 100 mcg daily.  7. Lovastatin 40 mg daily.  8. Metformin 500 mg b.i.d.  9.  Gemfibrozil 600 mg b.i.d.  10.Multivitamin.  11.Zantac.  12.Terazosin 5 mg daily.  13.Citalopram 10 mg daily.   PHYSICAL EXAMINATION:  GENERAL:  Patient in  no distress.  VITAL SIGNS:  Blood pressure is 148/70, pulse is 68, weight 169.  LUNGS:  Clear.  CARDIAC:  Regular rate and rhythm.  A grade 16 systolic murmur heard best at  the base.  ABDOMEN:  Benign.  EXTREMITIES:  No edema.   IMPRESSION:  Mr. Bostrom is an 77 year old gentleman referred by Dr. Jonny Ruiz for  dizziness.  With his fatigue, a Myoview scan was done also that showed no  significant ischemia.  His Holter monitor shows automatic idioventricular  rhythm at a rate about 50.  Whether this at times is related to his symptoms  or not is hard to say.  I have applied and written a letter for insurance to  allow an event monitor so he can keep  it with him as he has these spells.  I  think it is very important given his risk for falling and injury.   I would continue on his current regimen.  My only other thought with the  fatigue is to check a TSH.  We will do that today.  Also, again another  thought would be whether a CT/MRI of the brain should be done.  I will leave  this up to Dr. Jonny Ruiz.   I will set follow-up once I have heard back from the insurance company.                                Pricilla Riffle, MD, [facility]    PVR/MedQ  DD:  10/23/2005  DT:  10/23/2005  Job #:  [id number]   cc:   Corwin Levins, MD

## 2010-07-22 NOTE — Assessment & Plan Note (Signed)
[facility]                           ELECTROPHYSIOLOGY OFFICE NOTE   NAME:Blackwell Blackwell GADSON                       MRN:          [id number]  DATE:12/05/2005                            DOB:          1920-05-19    Blackwell Blackwell is referred today for evaluation by Pricilla Riffle, MD, for  consideration of bradycardia and sinus node dysfunction.  The patient is a  very pleasant, elderly male with a history of hypertension, diabetes, and  episodes of rare dizziness.  The patient has a very sedentary lifestyle and  is very inactive.  He has never had frank syncope.  His wife states that he  tends to stay up late and sleep in late and also has frequent daytime  somnolence and the patient, of note, states that he does have problems with  headaches during the daytime.  He denies peripheral edema.  He has never had  syncope.  His dizziness is very minimal.  The patient has subsequently  undergone cardiac monitors, which demonstrated an average heart rate of 55  beats per minute and a low heart rate in the 30s with a maximum heart rate  in the low 80s.  His pauses were never more than 3 seconds.  The patient  denies anginal symptoms and denies shortness of breath except with fairly  significant exertion.  His wife states that he is very sedentary and that  she hears him snoring quite loudly at nighttime and sometimes she thinks  that he stops breathing.   MEDICATIONS:  Actonel, aspirin, Avapro, felodipine, Lasix, Synthroid,  lovastatin, metformin, gemfibrozil, a multivitamin, terazosin, and Zantac  p.r.n.   FAMILY HISTORY:  Noncontributory due to his advanced age.  Notable for  mother dying at age 6 of old age and father dying at 44 of a stroke.   SOCIAL HISTORY:  The patient is married.  He denies tobacco use and does not  drink alcoholic beverages.  He does typically drink 2-3 caffeinated  beverages per day.  He is retired from [employer] and also  retired  Education administrator.  He denies tobacco and ethanol use as previously noted.   PAST MEDICAL HISTORY:  Notable for hernia, colon cancer, gallbladder  problems, and a history of cataracts.   REVIEW OF SYSTEMS:  Notable for arthritic complaints in his hands, arms and  legs.  He does have difficulty with his diabetes.  He has a history of colon  cancer, which is followed frequently.  He also has a history of vocal cord  cancer.  The rest of his review of systems was negative.   PHYSICAL EXAMINATION:  GENERAL:  A pleasant, well-appearing elderly man in  no acute distress.  VITAL SIGNS:  The blood pressure today was 130/78, the pulse 58 and regular,  the respirations were 18.  Weight was 169 pounds.  HEENT:  Normocephalic, atraumatic.  Pupils equal and round.  Oropharynx  moist.  The sclerae were anicteric.  NECK:  No jugular venous distention and no thyromegaly.  Trachea is midline.  The carotids are 2+ and symmetric.  LUNGS:  Clear  bilaterally to auscultation.  There were no wheezes, rales or  rhonchi.  CARDIOVASCULAR:  A regular bradycardia with normal S1 and S2.  The PMI was  not enlarged, nor was it laterally displaced.  ABDOMEN:  Obese, nontender, nondistended.  There was no organomegaly.  Bowel  sounds were present.  There was no rebound or guarding.  EXTREMITIES:  No cyanosis, clubbing or edema.  The pulses were 2+ and  symmetric.  NEUROLOGIC:  Alert and oriented x3 with cranial nerves II-XII intact and  strength was 5/5 and symmetric.   EKG demonstrates sinus bradycardia with normal axis and intervals.   IMPRESSION:  1. Sinus bradycardia with documented accelerated junctional rhythm      (idioventricular rhythm).  2. Loud snoring at nighttime, worrisome for sleep apnea, with daytime      somnolence and daytime headaches.  3. Obesity.   DISCUSSION:  Today we walked Blackwell Blackwell in the halls with a pulse oximetry  showing no desaturation and a heart rate of 88 beats per minute.  We  did not  push him particularly hard.  At the present time, despite his bradycardia I  have recommended a period of watchful waiting with regard to a pacemaker  implantation.  He may ultimately need a pacemaker, but at the present time  there is no clear indication for this.  I have asked that he be evaluated  with sleep study, as his symptoms are suggestive somewhat of sleep apnea.  This will be scheduled at the earliest possible convenient time.  I will  plan to see the patient back on a p.r.n. basis if he has more problems.            ______________________________  Doylene Canning. Ladona Ridgel, MD     GWT/MedQ  DD:  12/05/2005  DT:  12/06/2005  Job #:  [id number]

## 2010-07-22 NOTE — Letter (Signed)
October 16, 2005      RE:  AARIC, DOLPH  MRN:  [id number]  /  DOB:  07/11/1920   To Whom it May Concern:   Nathan Blackwell is an 77 year old gentleman who I have seen in cardiology  clinic for significant dizziness.  Workup so far has included a Myoview  scan, echocardiogram and Holter monitor.  The Holter monitor did show some  evidence of accelerated idioventricular rhythm.  I am not convinced these  are related to his symptoms necessarily, but he had several episodes of near  syncope.  My concern at his age of course.  If he were to pass out and fall,  this could be very dangerous.   I am requesting that he be considered for an event monitor to see if his  episodes of dizziness/pre-syncope correlate with this ventricular rhythm.  I  look forward to hearing from you.    Sincerely,      Pricilla Riffle, MD, [facility]   PVR/MedQ  DD:  10/16/2005  DT:  10/17/2005  Job #:  [id number]

## 2010-07-22 NOTE — Assessment & Plan Note (Signed)
[facility]                             PULMONARY OFFICE NOTE   NAME:Nathan Blackwell, Nathan Blackwell                       MRN:          [id number]  DATE:03/19/2006                            DOB:          1920/05/31    HISTORY OF PRESENT ILLNESS:  The patient is an 77 year old white male  who is being referred today for evaluation of obstructive sleep apnea.  He recently underwent nocturnal polysomnography on January 12, 2006  which was a split night study.  He was found to have 224 obstructive  events in the first 133 minutes of sleep.  This gave him a respiratory  disturbance index of 101 events per hour and 02 desaturation as low as  88%.  The patient was then titrated on CPAP with a large comfort full  face mask up to as high as 17 cm of water pressure.  The wife states  that he has significant snoring and pauses in his breathing during  sleep.  He denies choking arousals.  He typically gets to bed between 11  and 12 and gets up between 8 and 9 to start his day.  He does not feel  rested whenever he arises.  His wife states that he will fall asleep any  time that he sits down on any given day.  This will often be with TV,  movies, or reading.  He denies sleepiness with driving and his wife  verifies this.  Of note, his weight is down about 16 pounds over the  last 2 to 3 months.   PAST MEDICAL HISTORY:  1. Significant for hypertension.  2. History of blood clots.  3. 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  4. History of colon cancer that was treated with colectomy and chemo.  5. History of nephrolithiasis.   CURRENT MEDICATIONS:  Include:  1. Actonel 35 mg weekly.  2. Aspirin 81 mg daily.  3. Avapro 300 mg daily.  4. Felodipine 10 mg daily.  5. Furosemide 20 mg 1/2 daily.  6. Levothyroxine 100 mcg daily.  7. Lovastatin 40 mg daily.  8. Metformin 500 mg b.i.d.  9. Gemfibrozil 600 mg b.i.d.  10.Zantac 300 mg at bedtime.  11.Terazosin 5 mg daily.  12.Citalopram 10 mg daily.   Also various supplements.   The patient has no known drug allergy.   SOCIAL HISTORY:  He is married and has children, he has never smoked.   FAMILY HISTORY:  Is remarkable for his father having emphysema,  otherwise is noncontributory.   REVIEW OF SYSTEMS:  As per history of present illness.  Also see patient  intake form documented in the chart.   PHYSICAL EXAMINATION:  GENERAL:  He is an overweight male in no acute  distress.  Blood pressure is 128/62, pulse 57.  Temperature is 97.8, weight is 164  pounds.  O2 saturation on room air is 95%.  HEENT:  Pupils equal, round and reactive to light and accommodation.  Intraocular muscles are intact.  Both nares are extremely narrowed and  there is significant elongation of the palate  but a normal uvula.  NECK:  Is supple without JVD or lymphadenopathy.  There is no palpable  organomegaly.  CHEST:  Reveals basilar crackles, otherwise is clear.  CARDIAC:  Reveals a regular rate and rhythm.  ABDOMEN:  Is soft, nontender with good bowel sounds.  Genital exam, rectal exam, breast exam was not done and not indicated.  Lower extremities are without obvious edema.  Pulses are intact  distally.  NEUROLOGICALLY:  Alert and oriented with no obvious observable motor  defects.   IMPRESSION:  Severe obstructive sleep apnea documented by nocturnal  polysomnography.  I have had a long conversation with him about the  pathophysiology of sleep apnea, including the short term quality of life  issues and the long term cardiovascular issues.  He is clearly very  symptomatic during the day.  I have recommended at this time CPAP with  some modest weight loss.   PLAN:  We will initiate CPAP at 10 cm.  The patient will follow up in  approximately 4 weeks to see how he is doing on this.  We will then  slowly move him towards  his optimal therapy of 17 cm depending upon his  tolerance.     Barbaraann Share, MD,FCCP  Electronically Signed    KMC/MedQ  DD: 03/20/2006  DT: 03/20/2006  Job #: [id number]   cc:   Doylene Canning. Ladona Ridgel, MD  Corwin Levins, MD

## 2010-07-22 NOTE — Procedures (Signed)
NAME:  Nathan, Blackwell NO.:  [id number]   MEDICAL RECORD NO.:  [id number]          PATIENT TYPE:  OUT   LOCATION:  SLEEP CENTER                 FACILITY:  [facility]   PHYSICIAN:  Barbaraann Share, MD,FCCPDATE OF BIRTH:  08/15/22   DATE OF STUDY:  01/11/2006                              NOCTURNAL POLYSOMNOGRAM   REFERRING PHYSICIAN:  Dr. Sharrell Ku.   INDICATION FOR STUDY:  Hypersomnia with sleep apnea.   EPWORTH SCORE:  14.   SLEEP ARCHITECTURE:  The patient had total sleep time of 351 minutes with  very little REM and never achieved slow wave sleep. Sleep onset latency was  prolonged at 44 minutes, and REM onset was fairly rapid at 42 minutes. Sleep  efficiency was decreased at 75%.   RESPIRATORY DATA:  The patient underwent split night protocol where he was  found to have 224 obstructive events in the first 133 minutes of sleep. This  gave him a respiratory disturbance index of 101 events per hour over that  time period. The patient was found to have moderate to loud snoring. By  protocol the patient was then placed on a large ComfortFull Respironics  space mask and ultimately titrated to a final pressure of 17 cm with good  control of the obstructive events.   OXYGEN DATA:  There were 02 desaturations as low as 88% with the patient's  obstructive events.   CARDIAC DATA:  Sinus bradycardia was noted throughout but no clinically  significant erythema.   MOVEMENT/PARASOMNIA:  Small numbers of leg jerks with no significant sleep  disruption.   IMPRESSION/RECOMMENDATIONS:  Split night study reveals very severe  obstructive sleep apnea with a respiratory disturbance index of 101 events  per hour and O2 saturation as low as 88% during the first half of the night.  The patient was then placed on a large ComfortFull Respironics space mask  and ultimately titrated to a final pressure of 17 cm of water pressure with  excellent control.      Barbaraann Share,  MD,FCCP  Diplomate, American Board of Sleep  Medicine     KMC/MEDQ  D:  01/26/2006 14:52:31  T:  01/26/2006 15:18:53  Job:  [id number]

## 2010-07-22 NOTE — Assessment & Plan Note (Signed)
[facility]                              CARDIOLOGY OFFICE NOTE   NAME:Nathan Blackwell                       MRN:          [id number]  DATE:09/18/2005                            DOB:          Dec 11, 1920    IDENTIFICATION:  Nathan Blackwell is an 77 year old gentleman who is referred for  evaluation of dizziness.   HISTORY OF PRESENT ILLNESS:  The patient is not very active.  He was, in  June, on the 10th, climbing a couple of flights of stairs when he became  profoundly dizzy, had to sit down or he would have passed out.  He said  since that time he wears out with walking.  He has been sleeping more.  Again, he has had some episodes of dizziness with bending to standing, but  has not done anything like he did that day.  No episodes of syncope.   The patient was seen by Dr. Jonny Ruiz.  He was scheduled for an echocardiogram  that showed normal LV function with an LVEF of 55% to 60%.  The aortic valve  was mildly increased in thickness, but there was no evidence for stenosis.  The patient also had carotid Dopplers done that showed 0% to 39% right ICA  stenosis, 40% to 59% left ICA stenosis and recommend followup in 1 year.   ALLERGIES:  None.   MEDICATIONS:  1.  Actonel 35 mg weekly.  2.  Aspirin 81 mg daily.  3.  Avapro 300 mg daily.  4.  Felodipine 10 mg daily.  5.  Lasix 10 mg daily.  6.  Synthroid 100 mcg daily.  7.  Lovastatin 40 mg daily.  8.  Metformin 500 mg b.i.d.  9.  Gemfibrozil 600 mg b.i.d.  10. Multivitamin daily.  11. Zantac 300 mg nightly.  12. Terazosin 5 mg daily.  13. Citalopram 10 mg daily.   PAST MEDICAL HISTORY:  1.  Diabetes.  2.  Hypertension.  3.  Dyslipidemia.  Note:  Lipid panel done in March showed an LDL of 72, HDL      of 34.  4.  Arthritis.  5.  History of colon polyps.  6.  History of thyroid nodule.  7.  History of colon cancer.  8.  History of vocal cord cancer.   FAMILY HISTORY:  Mother died at age 92 of old  age.  Father died at age 10 of  a CVA.  One sister deceased at age 87, unknown causes.  Otherwise, a younger  brother with heart problems for several years.   REVIEW OF SYSTEMS:  Occasional swelling in the ankles.  Rare headaches.  Lightheadedness, shortness of breath and dizziness as noted.  Hypertension  since 1999.   PHYSICAL EXAMINATION:  GENERAL:  The patient is currently in no acute  distress.  The patient denies dizziness.  VITAL SIGNS:  Blood pressure lying 146/75, pulse 56; sitting 133/78, pulse  59; standing 0 minutes 145/74, pulse 60; 140/75 and pulse 71 at 2 minutes;  at 5 minutes, 146/76, pulse 61.  Weight 170 pounds.  NECK:  No bruits.  LUNGS:  Clear.  CARDIAC:  Regular rate and rhythm, S1 and S2, no S3, no significant murmurs.  ABDOMEN:  Benign.  Midline scar.  EXTREMITIES:  Pulses 2+, no edema.   TWELVE-LEAD EKG:  Normal sinus bradycardia of 57 beats per minute.  P-R  interval 166 msec.  Nonspecific ST changes.   IMPRESSION:  The patient is an 77 year old gentleman with a history of  dizziness and it sounds like significant shortness of breath with climbing.  He is not the most active individual.  He does have evidence of  cerebrovascular disease, but not critical.  Echocardiogram is within normal  limits.  Again, EKG shows a bradycardia, not on any agents to slow him.   I would, with his medical issues, recommend stress testing to rule out  inducible ischemia, also evaluate his heart rate response.  I will also  schedule him for Holter monitor to see what a 24-hour average rate is.  Continue activities as tolerated.  I will set to see him back after the  testing.                                Pricilla Riffle, MD, [facility]    PVR/MedQ  DD:  09/18/2005  DT:  09/19/2005  Job #:  [id number]   cc:   Corwin Levins, MD

## 2010-07-22 NOTE — Assessment & Plan Note (Signed)
[facility]                         GASTROENTEROLOGY OFFICE NOTE   NAME:Crotteau, Nathan Blackwell                       MRN:          [id number]  DATE:04/06/2006                            DOB:          1920/08/10    REASON FOR CONSULTATION:  Colonoscopy.   [name] is a pleasant 77 year old white male referred through the  courtesy of Dr. Jonny Ruiz for evaluation.  He was diagnosed with colon cancer  in 1998.  He underwent resection and followup chemotherapy.  He last  underwent colonoscopy approximately 3 years ago.  His only GI complaint  is intermittent urgency which sometimes causes incontinence.  He denies  abdominal pain.  He does claim to have passed some black stools at  times.   PAST MEDICAL HISTORY:  Pertinent for sleep apnea.  He has hypertension,  diabetes, arthritis, and a history of kidney stones.  He is status post  appendectomy, herniorrhaphy, cholecystectomy, and removal of a malignant  vocal cord polyp.   FAMILY HISTORY:  Noncontributory.   MEDICATIONS:  Include Actonel, baby aspirin, Avapro, felodipine,  furosemide, Levothroid, lovastatin, metformin, gemfibrozil, ranitidine,  Flomax, and Tylenol.  He has no allergies.   He does not smoke or drink.  He is married and retired.   REVIEW OF SYSTEMS:  Positive for feet swelling, sleeping problems, joint  pains, and excessive urination.   PHYSICAL EXAMINATION:  VITAL SIGNS:  Pulse 64, blood pressure 118/78,  weight 168.  HEENT:  EOMI. PERRLA. Sclerae are anicteric.  Conjunctivae are pink.  NECK:  Supple without thyromegaly, adenopathy or carotid bruits.  CHEST:  Clear to auscultation and percussion without adventitious  sounds.  CARDIAC:  Regular rhythm; normal S1 S2.  There are no murmurs, gallops  or rubs.  ABDOMEN:  Bowel sounds are normoactive.  Abdomen is soft, non-tender and  non-distended.  There are no abdominal masses, tenderness, splenic  enlargement or hepatomegaly.  EXTREMITIES:  Full range of motion.  No cyanosis, clubbing or edema.  RECTAL:  Deferred.   IMPRESSION:  1. History of colon cancer.  2. Sleep apnea.  3. Diabetes.  4. Stool urgency - probably related to surgery versus irritable bowel      syndrome.  5. Questionable history of black stools.   RECOMMENDATIONS:  1. Fiber supplementation.  2. Colonoscopy.     Barbette Hair. Arlyce Dice, MD,FACG  Electronically Signed    RDK/MedQ  DD: 04/06/2006  DT: 04/06/2006  Job #: [id number]

## 2010-07-23 ENCOUNTER — Encounter: Payer: Self-pay | Admitting: Internal Medicine

## 2010-07-23 DIAGNOSIS — K625 Hemorrhage of anus and rectum: Secondary | ICD-10-CM

## 2010-07-23 DIAGNOSIS — K922 Gastrointestinal hemorrhage, unspecified: Secondary | ICD-10-CM | POA: Insufficient documentation

## 2010-07-23 DIAGNOSIS — K644 Residual hemorrhoidal skin tags: Secondary | ICD-10-CM

## 2010-07-23 LAB — CBC
HCT: 28.5 % — ABNORMAL LOW (ref 39.0–52.0)
Hemoglobin: 9.4 g/dL — ABNORMAL LOW (ref 13.0–17.0)
MCV: 96.3 fL (ref 78.0–100.0)
RBC: 2.96 MIL/uL — ABNORMAL LOW (ref 4.22–5.81)
RDW: 13.1 % (ref 11.5–15.5)
WBC: 5.9 10*3/uL (ref 4.0–10.5)

## 2010-07-23 LAB — GLUCOSE, CAPILLARY
Glucose-Capillary: 87 mg/dL (ref 70–99)
Glucose-Capillary: 117 mg/dL — ABNORMAL HIGH (ref 70–99)
Glucose-Capillary: 178 mg/dL — ABNORMAL HIGH (ref 70–99)

## 2010-07-23 LAB — BASIC METABOLIC PANEL
BUN: 25 mg/dL — ABNORMAL HIGH (ref 6–23)
CO2: 27 mEq/L (ref 19–32)
Chloride: 105 mEq/L (ref 96–112)
GFR calc non Af Amer: 30 mL/min — ABNORMAL LOW (ref >60)
Glucose, Bld: 108 mg/dL — ABNORMAL HIGH (ref 70–99)
Potassium: 3.8 mEq/L (ref 3.5–5.1)
Sodium: 141 mEq/L (ref 135–145)

## 2010-07-23 LAB — TSH: TSH: 1.393 u[IU]/mL (ref 0.350–4.500)

## 2010-07-23 NOTE — Progress Notes (Signed)
Subjective:    Patient ID: Nathan Blackwell, male    DOB: [birth year], 77 y.o.   MRN: [id number]  HPI  Here with wife with acute onset rather large amounts BRB x 3 in the commode, with lower abd discomfort and feeling of rectal fullness as he may have another BM soon.  Pt denies chest pain, increased sob or doe, wheezing, orthopnea, PND, increased LE swelling, palpitations, dizziness or syncope.  Pt denies new neurological symptoms such as new headache, or facial or extremity weakness or numbness   Pt denies polydipsia, polyuria. Past Medical History  Diagnosis Date  . Diabetes mellitus type II   . Hyperlipidemia   . Hypertension   . Cancer of colon   . Cancer of vocal cord   . Osteoporosis   . Prostatic hypertrophy     benign  . OSA (obstructive sleep apnea)   . Rotator cuff syndrome     chronic  . Elevated PSA   . Hypothyroidism   . Nephrolithiasis   . Thyroid nodule   . GERD (gastroesophageal reflux disease)   . Colitis, Clostridium difficile     presumed 11/08  . Dementia   . Anemia     NOS  . Vitamin B 12 deficiency   . 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 BRADYCARDIA 06/10/2009  . COLON CANCER, HX OF 09/20/2006  . HYPOTHYROIDISM 01/14/2007  . DIABETES MELLITUS, TYPE II 09/20/2006  . HYPERLIPIDEMIA 09/20/2006  . ANEMIA-NOS 06/23/2008  . DEMENTIA 05/14/2007  . DEPRESSION 02/27/2007  . SLEEP APNEA, OBSTRUCTIVE 01/14/2007  . HYPERTENSION 09/20/2006  . GERD 01/14/2007  . OSTEOPOROSIS 10/08/2006  . PERIPHERAL EDEMA 09/03/2007  . RENAL INSUFFICIENCY 04/01/2010  . B12 deficiency 05/25/2010  . CLOSTRIDIUM DIFFICILE COLITIS 01/30/2007  . THYROID NODULE 01/14/2007  . CONSTIPATION, RECURRENT 01/06/2010  . BENIGN PROSTATIC HYPERTROPHY 10/08/2006  . ARTHRITIS 02/23/2007  . BACK PAIN, THORACIC REGION 06/04/2008  . COLONIC POLYPS, HX OF 10/08/2006  . NEPHROLITHIASIS, HX OF 01/14/2007   Past Surgical History  Procedure Date  . Cholecystectomy 1999  . Inguinal herniorrhapy 1984  .  Appendectomy 1998  . Colon resection 1998    reports that he has never smoked. He does not have any smokeless tobacco history on file. He reports that he does not drink alcohol. His drug history not on file. family history includes Diabetes in his mother and Stroke in his father. Allergies  Allergen Reactions  . Clarithromycin    Current Outpatient Prescriptions on File Prior to Visit  Medication Sig Dispense Refill  . ALPRAZolam (XANAX) 0.25 MG tablet Take 0.25 mg by mouth 2 (two) times daily.        Marland Kitchen amLODipine-olmesartan (AZOR) 5-40 MG per tablet Take 1 tablet by mouth daily.        Marland Kitchen aspirin 81 MG tablet Take 81 mg by mouth daily.        Marland Kitchen buPROPion (WELLBUTRIN XL) 150 MG 24 hr tablet Take 150 mg by mouth daily. Generic ok       . Cholecalciferol (VITAMIN D3) 1000 UNIT capsule Take 1,000 Units by mouth daily.        . citalopram (CELEXA) 20 MG tablet Take 20 mg by mouth daily.        . cyanocobalamin 1000 MCG/ML injection Inject 1,000 mcg into the muscle every 30 (thirty) days.        . diphenoxylate-atropine (LOMOTIL) 2.5-0.025 MG per tablet Take 1 tablet by mouth as directed. 1 by mouth as needed loose stool,  max 8 tabs per 24 hours.       . fenofibrate 160 MG tablet TAKE 1 TABLET BY MOUTH ONCE A DAY  30 tablet  11  . furosemide (LASIX) 20 MG tablet Take 20 mg by mouth daily.       Marland Kitchen glucose blood (ONE TOUCH TEST STRIPS) test strip 1 each by Other route as directed. Use 1 strip once a day - dx code 250.0       . hydrALAZINE (APRESOLINE) 25 MG tablet Take 1 tablet (25 mg total) by mouth 3 (three) times daily.  90 tablet  11  . lansoprazole (PREVACID) 30 MG capsule TAKE 1 CAPSULE BY MOUTH EVERY DAY  30 capsule  11  . levothyroxine (SYNTHROID, LEVOTHROID) 100 MCG tablet Take 100 mcg by mouth daily.        . meclizine (ANTIVERT) 25 MG tablet Take 25 mg by mouth as directed. 1/2 - 1 by mouth every 6 hours as needed for dizziness       . metFORMIN (GLUCOPHAGE) 500 MG tablet Take 500 mg by  mouth 2 (two) times daily.        Marland Kitchen OLANZapine (ZYPREXA) 5 MG tablet Take 5 mg by mouth daily.        . ondansetron (ZOFRAN) 4 MG tablet Take 4 mg by mouth as directed. 1 po every 6 hours as needed for nausea       . Pancrelipase, Lip-Prot-Amyl, (CREON) 12000 UNIT CPEP Take by mouth 3 (three) times daily before meals.        . promethazine (PHENERGAN) 25 MG tablet Take 25 mg by mouth every 6 (six) hours as needed.        . Tamsulosin HCl (FLOMAX) 0.4 MG CAPS Take 0.4 mg by mouth at bedtime.        Marland Kitchen acetaminophen (TYLENOL) 500 MG tablet Take 500 mg by mouth every 6 (six) hours as needed.        Marland Kitchen albuterol (PROAIR HFA) 108 (90 BASE) MCG/ACT inhaler Inhale 2 puffs into the lungs every 6 (six) hours as needed.         Review of Systems All otherwise neg per pt     Objective:   Physical Exam BP 132/62  Pulse 63  Temp(Src) 97.9 F (36.6 C) (Oral)  Ht 5\' 8"  (1.727 m)  Wt 169 lb 8 oz (76.885 kg)  BMI 25.77 kg/m2  SpO2 98% Physical Exam  VS noted Constitutional: Pt appears well-developed and well-nourished.  HENT: Head: Normocephalic.  Right Ear: External ear normal.  Left Ear: External ear normal.  Eyes: Conjunctivae and EOM are normal. Pupils are equal, round, and reactive to light.  Neck: Normal range of motion. Neck supple.  Cardiovascular: Normal rate and regular rhythm.   Pulmonary/Chest: Effort normal and breath sounds normal.  Abd:  Soft, NT, non-distended, + BS except for mild LLQ/lower mid abd tender, no guarding or rebound Neurological: Pt is alert. No cranial nerve deficit.  Skin: Skin is warm. No erythema.  Psychiatric: Pt behavior is normal. Thought content normal. \       Assessment & Plan:

## 2010-07-23 NOTE — Patient Instructions (Signed)
Please go directly across the street to the [facility] ER now

## 2010-07-23 NOTE — Assessment & Plan Note (Signed)
Hard to quantify, but suspect rather significant LGI bleed, fortunately not bleeding at the moment and not orthostatic, but d/w pt - should immediately go to [facility] ER for further eval and treatment, likely to include admission as inpt;  Pt agrees and wife will drive him across the street to the ER for this evaluation

## 2010-07-24 LAB — GLUCOSE, CAPILLARY: Glucose-Capillary: 88 mg/dL (ref 70–99)

## 2010-07-24 LAB — CBC
Hemoglobin: 9.1 g/dL — ABNORMAL LOW (ref 13.0–17.0)
MCV: 97.2 fL (ref 78.0–100.0)
Platelets: 234 10*3/uL (ref 150–400)
RBC: 2.89 MIL/uL — ABNORMAL LOW (ref 4.22–5.81)
WBC: 4.8 10*3/uL (ref 4.0–10.5)

## 2010-07-24 LAB — POCT OCCULT BLOOD STOOL (DEVICE): Fecal Occult Bld: NEGATIVE

## 2010-07-25 LAB — OCCULT BLOOD, POC DEVICE: Fecal Occult Bld: NEGATIVE

## 2010-07-26 NOTE — Discharge Summary (Signed)
NAME:  Nathan Blackwell, SCHEMBRI NO.:  [id number]  MEDICAL RECORD NO.:  [id number]           PATIENT TYPE:  I  LOCATION:  1415                         FACILITY:  [facility]  PHYSICIAN:  Andreas Blower, MD       DATE OF BIRTH:  1920-06-10  DATE OF ADMISSION:  07/22/2010 DATE OF DISCHARGE:                              DISCHARGE SUMMARY   PRIMARY CARE PHYSICIAN:  Corwin Levins, MD  GASTROENTEROLOGIST:  Barbette Hair. Arlyce Dice, MD, [facility]  DISCHARGE DIAGNOSES: 1. Rectal bleed likely due to hemorrhoidal source. 2. Anemia due to acute blood loss from the rectal bleed. 3. Hypertension. 4. Chronic kidney disease stage 3. 5. Type 2 diabetes. 6. History of depression. 7. Hyperlipidemia. 8. Hypothyroidism. 9. History of gastroesophageal reflux disease. 10.History of obstructive sleep apnea. 11.History for colon cancer with partial resection. 12.History of benign prostatic hypertrophy. 13.History of mild dementia. 14.History of nephrolithiasis.  DISCHARGE MEDICATIONS: 1. Hydrocortisone/lidocaine cream 1 application rectally twice daily. 2. MiraLax 17 g twice daily, decrease or stop if having diarrhea. 3. Acetaminophen 500 mg every 4 hours as needed for pain. 4. Amlodipine/olmesartan 5/40 one tablet p.o. q.a.m. 5. Alprazolam 0.25 mg p.o. q.a.m. 6. Aspirin 81 mg p.o. q.p.m.  The patient was instructed to hold for     at least 3 days prior to resuming. 7. Creon 1200 units p.o. 3 times a day. 8. Cyanocobalamin IM every month. 9. Cyanocobalamin 1 tablet p.o. q.a.m. 10.Citalopram 20 mg p.o. q.a.m. 11.Docusate 100 mg p.o. daily at bedtime. 12.Fenofibrate 160 mg p.o. daily. 13.Ferrous sulfate 325 mg p.o. daily. 14.Flomax 0.4 mg p.o. q.a.m. 15.Furosemide 20 mg p.o. daily. 16.Hydralazine 25 mg p.o. 3 times a day. 17.Lomotil 1 tablet as needed. 18.Levothyroxine 100 mcg p.o. q.a.m. 19.Losartan 40 mg p.o. daily. 20.Meclizine 25 mg p.o. q.6 h. as needed. 21.Lansoprazole 30 mg p.o.  daily 22.Promethazine 25 mg every 6 hours as needed for nausea. 23.Albuterol inhaler 2 puffs every 6 hours as needed for shortness of     breath. 24.Vitamin D3 1000 units p.o. q.a.m. 25.Wellbutrin SR 150 mg p.o. daily. 26.Olanzapine 5 mg p.o. q.p.m.Marland Kitchen  BRIEF ADMITTING HISTORY AND PHYSICAL:  Mr. Nathan Blackwell is an 77 year old Caucasian male with multiple medical conditions who presented to the ER on Jul 22, 2010, with complaint of rectal bleeding.  CONSULTATIONS:  Rachael Fee, MD with GI.  RADIOLOGY/IMAGING:  No radiology done.  CBC shows a white count of 4.1, hemoglobin 9.1, hematocrit 28.1, platelet count 234.  Initial hemoglobin on presentation was 11.2 which dropped down to 9.4, then was 9.2.  Electrolytes normal except BUN is 25, creatinine 2.11.  Liver function tests normal.  TSH is 1.393.  UA was negative for nitrites and leukocytes.  HOSPITAL COURSE BY PROBLEM: 1. Rectal bleed.  Initially, the patient had a drop in hemoglobin     and was aggressively hydrated.  I suspect the drop in hemoglobin     is delusional.  His hemoglobin 1 day prior to discharge was 9.4     and on the day of discharge was 9.1. Rectal bleed is likely due     to  hemorrhoidal source.  GI was consulted and Dr. Christella Hartigan evaluated     the patient.  Dr. Christella Hartigan recommended hydrocortisone-lidocaine     cream to be used for 7 days. The patient was on aspirin, which     was held during the course of hospital stay. 2. Anemia due to acute blood loss and chronic kidney disease.     Hemoglobin was stable, did not require any transfusions.  The     patient was instructed to continue to take ferrous sulfate after     discharge. 3. Hypertension, stable during the course of the hospital stay.  The     patient was continued on home medications. 4. Chronic kidney disease, stage 3, stable.  Continue the patient on     home dose of Lasix. 5. Diabetes.  The patient initially on admission was placed on sliding     scale  insulin.  The patient's metformin was held. The patient was     instructed to hold metformin until he sees his primary care     physician, Dr. Jonny Ruiz at which time the patient can discuss with Dr.     Jonny Ruiz about the need for resuming metformin given his chronic kidney     disease.  We will defer to Dr. Jonny Ruiz for further management. 6. History of depression, stable.  Continue the patient on     medications. 7. Hyperlipidemia.  Continue the patient on statin. 8. Hypothyroidism, stable.  Continue the patient on Synthroid. 9. History of gastroesophageal reflux disease.  Continue the patient     on PPI.  DISPOSITION AND FOLLOWUP:  The patient reports that he has an appointment with Dr. Jonny Ruiz on Jul 27, 2010.  The patient was instructed to keep that appointment, and have Dr. Jonny Ruiz check his CBC at that time.  At discharge, the patient was instructed to hold aspirin for 3 more days.  Time spent on discharge talking to the patient and chronic care was 25 minutes.   Andreas Blower, MD   SR/MEDQ  D:  07/24/2010  T:  07/24/2010  Job:  [id number]  Electronically Signed by Wardell Heath Naveena Eyman  on 07/26/2010 09:16:20 PM

## 2010-07-27 ENCOUNTER — Encounter: Payer: Self-pay | Admitting: Internal Medicine

## 2010-07-27 ENCOUNTER — Other Ambulatory Visit (INDEPENDENT_AMBULATORY_CARE_PROVIDER_SITE_OTHER): Payer: Medicare Other

## 2010-07-27 ENCOUNTER — Ambulatory Visit: Payer: Medicare Other | Admitting: Internal Medicine

## 2010-07-27 ENCOUNTER — Ambulatory Visit (INDEPENDENT_AMBULATORY_CARE_PROVIDER_SITE_OTHER): Payer: Medicare Other | Admitting: Internal Medicine

## 2010-07-27 VITALS — BP 154/64 | HR 60 | Temp 98.2°F | Ht 68.0 in | Wt 172.1 lb

## 2010-07-27 DIAGNOSIS — I10 Essential (primary) hypertension: Secondary | ICD-10-CM

## 2010-07-27 DIAGNOSIS — E119 Type 2 diabetes mellitus without complications: Secondary | ICD-10-CM

## 2010-07-27 DIAGNOSIS — K922 Gastrointestinal hemorrhage, unspecified: Secondary | ICD-10-CM

## 2010-07-27 DIAGNOSIS — R609 Edema, unspecified: Secondary | ICD-10-CM

## 2010-07-27 LAB — CBC WITH DIFFERENTIAL/PLATELET
Basophils Absolute: 0 10*3/uL (ref 0.0–0.1)
Eosinophils Absolute: 0.2 10*3/uL (ref 0.0–0.7)
Hemoglobin: 10.8 g/dL — ABNORMAL LOW (ref 13.0–17.0)
Lymphocytes Relative: 21 % (ref 12.0–46.0)
Lymphs Abs: 1.6 10*3/uL (ref 0.7–4.0)
MCHC: 34.4 g/dL (ref 30.0–36.0)
Neutro Abs: 5.2 10*3/uL (ref 1.4–7.7)
Platelets: 274 10*3/uL (ref 150.0–400.0)
RDW: 13.7 % (ref 11.5–14.6)

## 2010-07-27 MED ORDER — SITAGLIPTIN PHOSPHATE 50 MG PO TABS: 50.0000 mg | ORAL_TABLET | Freq: Every day | ORAL | Status: DC

## 2010-07-27 NOTE — Assessment & Plan Note (Signed)
stable overall by hx and exam after IVF's while hospd, and pt to continue medical treatment as before

## 2010-07-27 NOTE — Patient Instructions (Addendum)
Take all new medications as prescribed - the sugar medication - januvia 50 mg Stop the metformin as you have Continue all other medications as before, except stay off the Aspirin, and cont the hemorrhoidal cream for now Continue the miralax, and you can take the stool softner as well as needed Please go to LAB in the Basement for the blood and/or urine tests to be done today Please call the phone number [phone] (the PhoneTree System) for results of testing in 2-3 days;  When calling, simply dial the number, and when prompted enter the MRN number above (the Medical Record Number) and the # key, then the message should start. Please return in 3 months

## 2010-07-27 NOTE — Assessment & Plan Note (Addendum)
Will need to d/c the metformin due to renal fxn,  To start januvia 50 mg daily,  to f/u any worsening symptoms or concerns, cont to monitor cbg's at home, watch diet and wt control

## 2010-07-27 NOTE — Progress Notes (Signed)
Subjective:    Patient ID: Nathan Blackwell, male    DOB: 01-17-1921, 77 y.o.   MRN: [id number]  HPI  Here to f/u, admitted with the GI bleed, tx with IVF's.felt after GI eval to be hemorrhoidal in nature, no colonscopy done, initial HGB 11.1 dropped to 9.1, but felt to be dilutional, tx with hemorrhoidal prep and miralax;  Overall d/c on may 20, and done well until this am with some minor BRB again.  Had some dizziness as he usually does when first sitting up in bed in the am, better after a few mintues, no further dizziness today.  Ambulating ok, appetitie ok,  Pt denies fever, wt loss, night sweats, loss of appetite, or other constitutional symptoms. No rectal pain except with the BM this am with straining, not taking stool softner since started the miralax.  Wife would like to resume glycerin supp prn as well as he has done ok with this in the past.  Pt denies chest pain, increased sob or doe, wheezing, orthopnea, PND, increased LE swelling, palpitations, dizziness or syncope.  Pt denies new neurological symptoms such as new headache, or facial or extremity weakness or numbness.   Pt denies polydipsia, polyuria.  Not taking ASA as well ,  Also off the metformin due to CKD. Past Medical History  Diagnosis Date  . Diabetes mellitus type II   . Hyperlipidemia   . Hypertension   . Cancer of colon   . Cancer of vocal cord   . Osteoporosis   . Prostatic hypertrophy     benign  . OSA (obstructive sleep apnea)   . Rotator cuff syndrome     chronic  . Elevated PSA   . Hypothyroidism   . Nephrolithiasis   . Thyroid nodule   . GERD (gastroesophageal reflux disease)   . Colitis, Clostridium difficile     presumed 11/08  . Dementia   . Anemia     NOS  . Vitamin B 12 deficiency   . 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 BRADYCARDIA 06/10/2009  . COLON CANCER, HX OF 09/20/2006  . HYPOTHYROIDISM 01/14/2007  . DIABETES MELLITUS, TYPE II 09/20/2006  . HYPERLIPIDEMIA 09/20/2006  . ANEMIA-NOS 06/23/2008    . DEMENTIA 05/14/2007  . DEPRESSION 02/27/2007  . SLEEP APNEA, OBSTRUCTIVE 01/14/2007  . HYPERTENSION 09/20/2006  . GERD 01/14/2007  . OSTEOPOROSIS 10/08/2006  . PERIPHERAL EDEMA 09/03/2007  . RENAL INSUFFICIENCY 04/01/2010  . B12 deficiency 05/25/2010  . CLOSTRIDIUM DIFFICILE COLITIS 01/30/2007  . THYROID NODULE 01/14/2007  . CONSTIPATION, RECURRENT 01/06/2010  . BENIGN PROSTATIC HYPERTROPHY 10/08/2006  . ARTHRITIS 02/23/2007  . BACK PAIN, THORACIC REGION 06/04/2008  . COLONIC POLYPS, HX OF 10/08/2006  . NEPHROLITHIASIS, HX OF 01/14/2007   Past Surgical History  Procedure Date  . Cholecystectomy 1999  . Inguinal herniorrhapy 1984  . Appendectomy 1998  . Colon resection 1998    reports that he has never smoked. He does not have any smokeless tobacco history on file. He reports that he does not drink alcohol. His drug history not on file. family history includes Diabetes in his mother and Stroke in his father. Allergies  Allergen Reactions  . Clarithromycin    Current Outpatient Prescriptions on File Prior to Visit  Medication Sig Dispense Refill  . acetaminophen (TYLENOL) 500 MG tablet Take 500 mg by mouth every 6 (six) hours as needed.        Marland Kitchen albuterol (PROAIR HFA) 108 (90 BASE) MCG/ACT inhaler Inhale 2 puffs into the  lungs every 6 (six) hours as needed.        . ALPRAZolam (XANAX) 0.25 MG tablet Take 0.25 mg by mouth 2 (two) times daily.        Marland Kitchen amLODipine-olmesartan (AZOR) 5-40 MG per tablet Take 1 tablet by mouth daily.        Marland Kitchen buPROPion (WELLBUTRIN XL) 150 MG 24 hr tablet Take 150 mg by mouth daily. Generic ok       . Cholecalciferol (VITAMIN D3) 1000 UNIT capsule Take 1,000 Units by mouth daily.        . citalopram (CELEXA) 20 MG tablet Take 20 mg by mouth daily.        . cyanocobalamin 1000 MCG/ML injection Inject 1,000 mcg into the muscle every 30 (thirty) days.        . diphenoxylate-atropine (LOMOTIL) 2.5-0.025 MG per tablet Take 1 tablet by mouth as directed. 1 by mouth as  needed loose stool, max 8 tabs per 24 hours.       . fenofibrate 160 MG tablet TAKE 1 TABLET BY MOUTH ONCE A DAY  30 tablet  11  . furosemide (LASIX) 20 MG tablet Take 20 mg by mouth daily.       Marland Kitchen glucose blood (ONE TOUCH TEST STRIPS) test strip 1 each by Other route as directed. Use 1 strip once a day - dx code 250.0       . hydrALAZINE (APRESOLINE) 25 MG tablet Take 1 tablet (25 mg total) by mouth 3 (three) times daily.  90 tablet  11  . lansoprazole (PREVACID) 30 MG capsule TAKE 1 CAPSULE BY MOUTH EVERY DAY  30 capsule  11  . meclizine (ANTIVERT) 25 MG tablet Take 25 mg by mouth as directed. 1/2 - 1 by mouth every 6 hours as needed for dizziness       . OLANZapine (ZYPREXA) 5 MG tablet Take 5 mg by mouth daily.        . ondansetron (ZOFRAN) 4 MG tablet Take 4 mg by mouth as directed. 1 po every 6 hours as needed for nausea       . Pancrelipase, Lip-Prot-Amyl, (CREON) 12000 UNIT CPEP Take by mouth 3 (three) times daily before meals.        . promethazine (PHENERGAN) 25 MG tablet Take 25 mg by mouth every 6 (six) hours as needed.        . Tamsulosin HCl (FLOMAX) 0.4 MG CAPS Take 0.4 mg by mouth at bedtime.        Marland Kitchen aspirin 81 MG tablet Take 81 mg by mouth daily.        Marland Kitchen levothyroxine (SYNTHROID, LEVOTHROID) 100 MCG tablet Take 100 mcg by mouth daily.        Marland Kitchen DISCONTD: metFORMIN (GLUCOPHAGE) 500 MG tablet Take 500 mg by mouth 2 (two) times daily.         Review of Systems All otherwise neg per pt     Objective:   Physical Exam BP 154/64  Pulse 60  Temp(Src) 98.2 F (36.8 C) (Oral)  Ht 5\' 8"  (1.727 m)  Wt 172 lb 2 oz (78.075 kg)  BMI 26.17 kg/m2  SpO2 98% Physical Exam  VS noted Constitutional: Pt appears well-developed and well-nourished.  HENT: Head: Normocephalic.  Right Ear: External ear normal.  Left Ear: External ear normal.  Eyes: Conjunctivae and EOM are normal. Pupils are equal, round, and reactive to light.  Neck: Normal range of motion. Neck supple.  Cardiovascular:  Normal rate and  regular rhythm.   Pulmonary/Chest: Effort normal and breath sounds normal.  Abd:  Soft, NT, non-distended, + BS Neurological: Pt is alert. No cranial nerve deficit.  Skin: Skin is warm. No erythema.  Psychiatric: Pt behavior is normal. Thought content c/w dementia.         Assessment & Plan:

## 2010-07-27 NOTE — Progress Notes (Signed)
Quick Note:  Voice message left on PhoneTree system - lab is negative, normal or otherwise stable, pt to continue same tx ______ 

## 2010-07-27 NOTE — Assessment & Plan Note (Signed)
stable overall by hx and exam - has been somewhat labilt, most recent lab reviewed with pt, and pt to continue medical treatment as before  BP Readings from Last 3 Encounters:  07/27/10 154/64  07/22/10 132/62  06/30/10 158/76   Lab Results  Component Value Date   WBC 7.4 07/27/2010   HGB 10.8* 07/27/2010   HCT 31.5* 07/27/2010   PLT 274.0 07/27/2010   CHOL 174 11/18/2009   TRIG 167.0* 11/18/2009   HDL 48.70 11/18/2009   LDLDIRECT 91.1 05/14/2007   ALT 20 07/22/2010   AST 24 07/22/2010   NA 141 07/23/2010   K 3.8 07/23/2010   CL 105 07/23/2010   CREATININE 2.11* 07/23/2010   BUN 25* 07/23/2010   CO2 27 07/23/2010   TSH 1.393 07/23/2010   PSA 4.88* 01/14/2007   INR 0.99 07/22/2010   HGBA1C 6.7* 11/18/2009   MICROALBUR 29.7* 05/18/2009

## 2010-07-27 NOTE — Assessment & Plan Note (Signed)
prob hemorrhoidal - for  [facility] f/u today

## 2010-08-03 NOTE — H&P (Signed)
NAME:  Nathan Blackwell, Nathan Blackwell NO.:  [id number]  MEDICAL RECORD NO.:  [id number]           PATIENT TYPE:  E  LOCATION:  [facility]                         FACILITY:  [facility]  PHYSICIAN:  Nathan Ranger, MD       DATE OF BIRTH:  11/24/1920  DATE OF ADMISSION:  07/22/2010 DATE OF DISCHARGE:                             HISTORY & PHYSICAL   PRIMARY CARE PHYSICIAN:  Dr. Oliver Blackwell.  GASTROENTEROLOGIST:  The patient's primary gastroenterologist is Dr. Barbette Blackwell. Nathan Blackwell.  CHIEF COMPLAINT:  Rectal bleeding.  HISTORY OF PRESENT ILLNESS:  [name] is an 77 year old male with prior history of colon cancer, hypertension, diabetes, GERD who presented to the [facility] with chief complaint of rectal bleeding which started today.  History was provided by the patient who stated that he had 3 bowel movements this morning which were bright red blood.  He otherwise denied any dizziness, lightheadedness or any syncopal episodes.  He denied any chest pain or shortness of breath or any palpitations.  He states that he had bowel movements around 9 a.m. and since then he had not had any bleeding reported.  He also noticed some staining on the toilet paper with the blood 2 to 3 weeks ago.  The patient has a history of colon cancer and his gastroenterologist is Dr. Arlyce Blackwell.  The patient states that he had a colonoscopy done within last 5 years.  REVIEW OF SYSTEMS:  Pertinent positives as dictated above, otherwise negative.  PAST MEDICAL HISTORY: 1. Hypertension. 2. Diabetes. 3. History of colon cancer with partial resection. 4. Depression. 5. GERD. 6. Hyperlipidemia. 7. Hypothyroidism. 8. History of kidney stones. 9. Osteoporosis. 10.History of  sleep apnea.  SURGICAL HISTORY: 1. Appendectomy. 2. Colon resection.  SOCIAL HISTORY:  The patient denies any smoking, alcohol or any drug use.  He currently lives at home and ambulates by himself.  He does have a cane but  rarely uses it.  ALLERGIES:  The patient is allergic to [facility].  MEDICATIONS:  Mediations prior to admission are alprazolam 0.25 mg b.i.d., acetaminophen 500 mg as needed, albuterol inhaler 2 puffs as needed, Azor 5/40 mg p.o. daily, aspirin 81 mg daily, bupropion 150 mg once a day, cholecalciferol 1000 units once a day, Celexa 20 mg daily, Lomotil 2.5 mg as needed, fenofibrate 160 mg daily, Lasix 20 mg daily, Apresoline 25 mg p.o. t.i.d., Prevacid 30 mg p.o. daily, Synthroid 100 mcg daily, Antivert 12.5 mg as needed, metformin 500 mg b.i.d., Zyprexa 5 mg p.o. daily, Creon 12,000 units t.i.d., Phenergan 25 mg as needed, Flomax 0.4 mg at bedtime.  PHYSICAL EXAMINATION:  VITAL SIGNS:  Blood pressure 173/58, pulse rate 64, respiratory rate 20, temperature 98.4. GENERAL:  The patient is alert, awake, oriented, very pleasant and cooperative, not in any acute distress. HEENT:  Anicteric sclerae.  Pink conjunctivae.  Pupils are reactive to light and accommodation.  EOMI. NECK:  Supple.  No lymphopathy, no JVD. CVS:  S1, S2 clear. CHEST:  Clear to auscultation bilaterally. ABDOMEN:  Soft, nontender, nondistended.  Normal bowel sounds. EXTREMITIES:  No cyanosis, clubbing or  edema noted in upper or lower extremities bilaterally. NEURO:  No focal neurological deficits noted.  LAB DIAGNOSTIC DATA:  Sodium 139, potassium 4.0, BUN 30, creatinine 2.25 and baseline around 2.  LFTs essentially normal.  UA negative for any UTI.  INR 0.9.  WBC 6.7, hemoglobin 11.2, hematocrit 34.0, platelets 295.  IMPRESSION AND PLAN:  Mr. Nathan Blackwell is an 77 year old male with history of hypertension, diabetes, colon cancer, GERD, hyperlipidemia who presented with rectal bleeding x3 this morning. 1. Rectal bleeding/lower gastrointestinal bleed.  The patient is     currently hemodynamically stable, is not having any active GI     bleed.  We will admit the patient to tele monitored floor.  We will     continue on  proton pump inhibitor and place him on clear liquid     diet.  GI consult will be obtained in a.m.  The patient follows     with Dr. Arlyce Blackwell.  He most likely needs repeat colonoscopy given his     history of colon cancer. 2. Hypertension.  We will hold Azor and Lasix for now given the acute     renal failure.  Place the patient on hydralazine p.r.n. and     continue p.o. Apresoline. 3. Hypothyroidism.  Obtain TSH and continue Synthroid. 4. Acute renal insufficiency on chronic kidney disease stage 3, likely     secondary to gastrointestinal bleed and also     the patient is on diuretics at home.  We will hold Lasix and gently     hydrate.  Baseline creatinine is 2. 5. Diabetes mellitus.  We will place the patient on sliding scale     insulin for now. 6. Prophylaxis.  Bilateral SCDs for DVT prophylaxis.     Nathan Ranger, MD     RR/MEDQ  D:  07/22/2010  T:  07/22/2010  Job:  [id number]  cc:   Nathan Levins, MD [address] [place] [address]  Nathan Blackwell. Nathan Dice, MD,FACG [address] [place] [address]  Electronically Signed by Nathan Blackwell RAI  on 08/03/2010 10:19:31 AM

## 2010-09-12 ENCOUNTER — Other Ambulatory Visit: Payer: Self-pay | Admitting: Internal Medicine

## 2010-09-20 ENCOUNTER — Encounter: Payer: Self-pay | Admitting: Pulmonary Disease

## 2010-09-20 ENCOUNTER — Encounter: Payer: Self-pay | Admitting: Internal Medicine

## 2010-09-20 ENCOUNTER — Ambulatory Visit (INDEPENDENT_AMBULATORY_CARE_PROVIDER_SITE_OTHER): Payer: Medicare Other | Admitting: Internal Medicine

## 2010-09-20 ENCOUNTER — Ambulatory Visit (INDEPENDENT_AMBULATORY_CARE_PROVIDER_SITE_OTHER): Payer: Medicare Other | Admitting: Pulmonary Disease

## 2010-09-20 ENCOUNTER — Ambulatory Visit: Payer: Medicare Other

## 2010-09-20 VITALS — BP 108/56 | HR 77 | Temp 97.6°F | Ht 68.5 in | Wt 174.4 lb

## 2010-09-20 VITALS — BP 100/54 | HR 62 | Temp 97.8°F | Ht 68.0 in | Wt 173.2 lb

## 2010-09-20 DIAGNOSIS — I10 Essential (primary) hypertension: Secondary | ICD-10-CM

## 2010-09-20 DIAGNOSIS — J209 Acute bronchitis, unspecified: Secondary | ICD-10-CM

## 2010-09-20 DIAGNOSIS — E119 Type 2 diabetes mellitus without complications: Secondary | ICD-10-CM

## 2010-09-20 DIAGNOSIS — G4733 Obstructive sleep apnea (adult) (pediatric): Secondary | ICD-10-CM

## 2010-09-20 DIAGNOSIS — E538 Deficiency of other specified B group vitamins: Secondary | ICD-10-CM

## 2010-09-20 MED ORDER — CYANOCOBALAMIN 1000 MCG/ML IJ SOLN
1000.0000 ug | Freq: Once | INTRAMUSCULAR | Status: AC
  Administered 2010-09-20: 1000 ug via INTRAMUSCULAR

## 2010-09-20 MED ORDER — LEVOFLOXACIN 250 MG PO TABS: 250.0000 mg | ORAL_TABLET | Freq: Every day | ORAL | Status: AC

## 2010-09-20 NOTE — Assessment & Plan Note (Signed)
stable overall by hx and exam, most recent data reviewed with pt, and pt to continue medical treatment as before  Lab Results  Component Value Date   HGBA1C 6.7* 11/18/2009    

## 2010-09-20 NOTE — Progress Notes (Signed)
Subjective:    Patient ID: Nathan Blackwell, male    DOB: 02-Dec-1920, 77 y.o.   MRN: [id number]  HPI  Here with acute onset mild to mod 2-3 days ST, HA, general weakness and malaise, with prod cough greenish sputum, but Pt denies chest pain, increased sob or doe, wheezing, orthopnea, PND, increased LE swelling, palpitations, dizziness or syncope.  Pt denies new neurological symptoms such as new headache, or facial or extremity weakness or numbness   Pt denies polydipsia, polyuria, or low sugar symptoms such as weakness or confusion improved with po intake.  Pt states overall good compliance with meds, trying to follow lower cholesterol, diabetic diet, wt overall stable but little exercise however. Overall good compliance with treatment, and good medicine tolerability.  Past Medical History  Diagnosis Date  . Diabetes mellitus type II   . Hyperlipidemia   . Hypertension   . Cancer of colon   . Cancer of vocal cord   . Osteoporosis   . Prostatic hypertrophy     benign  . OSA (obstructive sleep apnea)   . Rotator cuff syndrome     chronic  . Elevated PSA   . Hypothyroidism   . Nephrolithiasis   . Thyroid nodule   . GERD (gastroesophageal reflux disease)   . Colitis, Clostridium difficile     presumed 11/08  . Dementia   . Anemia     NOS  . Vitamin B 12 deficiency   . 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 BRADYCARDIA 06/10/2009  . COLON CANCER, HX OF 09/20/2006  . HYPOTHYROIDISM 01/14/2007  . DIABETES MELLITUS, TYPE II 09/20/2006  . HYPERLIPIDEMIA 09/20/2006  . ANEMIA-NOS 06/23/2008  . DEMENTIA 05/14/2007  . DEPRESSION 02/27/2007  . SLEEP APNEA, OBSTRUCTIVE 01/14/2007  . HYPERTENSION 09/20/2006  . GERD 01/14/2007  . OSTEOPOROSIS 10/08/2006  . PERIPHERAL EDEMA 09/03/2007  . RENAL INSUFFICIENCY 04/01/2010  . B12 deficiency 05/25/2010  . CLOSTRIDIUM DIFFICILE COLITIS 01/30/2007  . THYROID NODULE 01/14/2007  . CONSTIPATION, RECURRENT 01/06/2010  . BENIGN PROSTATIC HYPERTROPHY 10/08/2006  .  ARTHRITIS 02/23/2007  . BACK PAIN, THORACIC REGION 06/04/2008  . COLONIC POLYPS, HX OF 10/08/2006  . NEPHROLITHIASIS, HX OF 01/14/2007   Past Surgical History  Procedure Date  . Cholecystectomy 1999  . Inguinal herniorrhapy 1984  . Appendectomy 1998  . Colon resection 1998    reports that he has never smoked. He does not have any smokeless tobacco history on file. He reports that he does not drink alcohol. His drug history not on file. family history includes Diabetes in his mother and Stroke in his father. Allergies  Allergen Reactions  . Clarithromycin    Current Outpatient Prescriptions on File Prior to Visit  Medication Sig Dispense Refill  . acetaminophen (TYLENOL) 500 MG tablet Take 500 mg by mouth every 6 (six) hours as needed.        Marland Kitchen albuterol (PROAIR HFA) 108 (90 BASE) MCG/ACT inhaler Inhale 2 puffs into the lungs every 6 (six) hours as needed.        . ALPRAZolam (XANAX) 0.25 MG tablet Take 0.25 mg by mouth 2 (two) times daily.        Marland Kitchen amLODipine-olmesartan (AZOR) 5-40 MG per tablet Take 1 tablet by mouth daily.        Marland Kitchen buPROPion (WELLBUTRIN XL) 150 MG 24 hr tablet TAKE 1 TABLET BY MOUTH EVERY DAY  30 tablet  11  . Cholecalciferol (VITAMIN D3) 1000 UNIT capsule Take 1,000 Units by mouth daily.        Marland Kitchen  citalopram (CELEXA) 20 MG tablet Take 20 mg by mouth daily.        . cyanocobalamin 1000 MCG/ML injection Inject 1,000 mcg into the muscle every 30 (thirty) days.        . diphenoxylate-atropine (LOMOTIL) 2.5-0.025 MG per tablet Take 1 tablet by mouth as directed. 1 by mouth as needed loose stool, max 8 tabs per 24 hours.       . fenofibrate 160 MG tablet TAKE 1 TABLET BY MOUTH ONCE A DAY  30 tablet  11  . ferrous sulfate 325 (65 FE) MG tablet Take 325 mg by mouth daily.        . furosemide (LASIX) 20 MG tablet Take 20 mg by mouth daily.       Marland Kitchen glucose blood (ONE TOUCH TEST STRIPS) test strip 1 each by Other route as directed. Use 1 strip once a day - dx code 250.0       .  hydrALAZINE (APRESOLINE) 25 MG tablet Take 1 tablet (25 mg total) by mouth 3 (three) times daily.  90 tablet  11  . lansoprazole (PREVACID) 30 MG capsule TAKE 1 CAPSULE BY MOUTH EVERY DAY  30 capsule  11  . levothyroxine (SYNTHROID, LEVOTHROID) 100 MCG tablet Take 100 mcg by mouth daily.        . Lidocaine-Hydrocortisone Ace 3-0.5 % CREA Apply twice per day for 7 days       . lovastatin (MEVACOR) 40 MG tablet Take 40 mg by mouth daily.        . meclizine (ANTIVERT) 25 MG tablet Take 25 mg by mouth as directed. 1/2 - 1 by mouth every 6 hours as needed for dizziness       . OLANZapine (ZYPREXA) 5 MG tablet Take 5 mg by mouth daily.        . ondansetron (ZOFRAN) 4 MG tablet Take 4 mg by mouth as directed. 1 po every 6 hours as needed for nausea       . Pancrelipase, Lip-Prot-Amyl, (CREON) 12000 UNIT CPEP Take by mouth 3 (three) times daily before meals.        . polyethylene glycol powder (MIRALAX) powder Take 17 g by mouth 2 (two) times daily.        . promethazine (PHENERGAN) 25 MG tablet Take 25 mg by mouth every 6 (six) hours as needed.        . sitaGLIPtan (JANUVIA) 50 MG tablet Take 1 tablet (50 mg total) by mouth daily.  30 tablet  11  . Tamsulosin HCl (FLOMAX) 0.4 MG CAPS Take 0.4 mg by mouth at bedtime.         No current facility-administered medications on file prior to visit.   Review of Systems Review of Systems  Constitutional: Negative for diaphoresis and unexpected weight change.  HENT: Negative for drooling and tinnitus.   Eyes: Negative for photophobia and visual disturbance.  Respiratory: Negative for choking and stridor.   Gastrointestinal: Negative for vomiting and blood in stool.  Genitourinary: Negative for hematuria and decreased urine volume.       Objective:   Physical Exam BP 100/54  Pulse 62  Temp(Src) 97.8 F (36.6 C) (Oral)  Ht 5\' 8"  (1.727 m)  Wt 173 lb 4 oz (78.586 kg)  BMI 26.34 kg/m2  SpO2 96% Physical Exam  VS noted, mild ill  appearing Constitutional: Pt appears well-developed and well-nourished.  HENT: Head: Normocephalic.  Right Ear: External ear normal.  Left Ear: External ear normal.  Bilat  tm's mild erythema.  Sinus nontender.  Pharynx mild erythema Eyes: Conjunctivae and EOM are normal. Pupils are equal, round, and reactive to light.  Neck: Normal range of motion. Neck supple.  Cardiovascular: Normal rate and regular rhythm.   Pulmonary/Chest: Effort normal and breath sounds normal.  Neurological: Pt is alert. No cranial nerve deficit.  Skin: Skin is warm. No erythema.          Assessment & Plan:

## 2010-09-20 NOTE — Patient Instructions (Signed)
Continue with your bipap machine Will recheck your pressure at home for 2 weeks to make sure is set right. If doing well, followup with me in 1 year.

## 2010-09-20 NOTE — Patient Instructions (Signed)
Take all new medications as prescribed Continue all other medications as before  

## 2010-09-20 NOTE — Assessment & Plan Note (Signed)
stable overall by hx and exam, most recent data reviewed with pt, and pt to continue medical treatment as before  BP Readings from Last 3 Encounters:  09/20/10 100/54  09/20/10 108/56  07/27/10 154/64

## 2010-09-20 NOTE — Progress Notes (Signed)
  Subjective:    Patient ID: Nathan Blackwell, male    DOB: 1920/07/30, 77 y.o.   MRN: [id number]  HPI The pt comes in today for f/u of his known osa.  He is wearing bipap compliantly, and is not having any issues with his mask fit.  He feels he sleeps well with the device, but is still having some daytime sleepiness with inactivity.  He thinks the mask is sealing adequately, although this has been an issue for him in the past.    Review of Systems  Constitutional: Negative for fever and unexpected weight change.  HENT: Positive for congestion, sore throat, rhinorrhea, postnasal drip and sinus pressure. Negative for ear pain, nosebleeds, sneezing, trouble swallowing and dental problem.   Eyes: Negative for redness and itching.  Respiratory: Positive for cough. Negative for chest tightness, shortness of breath and wheezing.   Cardiovascular: Negative for palpitations and leg swelling.  Gastrointestinal: Negative for nausea and vomiting.  Genitourinary: Negative for dysuria.  Musculoskeletal: Negative for joint swelling.  Skin: Negative for rash.  Neurological: Positive for headaches.  Hematological: Bruises/bleeds easily.  Psychiatric/Behavioral: Negative for dysphoric mood. The patient is not nervous/anxious.        Objective:   Physical Exam Wd male in nad No skin breakdown or pressure necrosis from cpap mask.  No nasal drainage or purulence LE with minimal edema, compression hose in place Alert, does not appear overly sleepy, moves all 4        Assessment & Plan:

## 2010-09-20 NOTE — Assessment & Plan Note (Signed)
Mild to mod, for antibx course,  to f/u any worsening symptoms or concerns 

## 2010-09-22 ENCOUNTER — Encounter: Payer: Self-pay | Admitting: Pulmonary Disease

## 2010-09-22 ENCOUNTER — Emergency Department (HOSPITAL_COMMUNITY)
Admission: EM | Admit: 2010-09-22 | Discharge: 2010-09-23 | Disposition: A | Discharge status: Home / Self Care | Payer: Medicare Other | Attending: Emergency Medicine | Admitting: Emergency Medicine

## 2010-09-22 DIAGNOSIS — R112 Nausea with vomiting, unspecified: Secondary | ICD-10-CM | POA: Insufficient documentation

## 2010-09-22 DIAGNOSIS — E039 Hypothyroidism, unspecified: Secondary | ICD-10-CM | POA: Insufficient documentation

## 2010-09-22 DIAGNOSIS — I10 Essential (primary) hypertension: Secondary | ICD-10-CM | POA: Insufficient documentation

## 2010-09-22 DIAGNOSIS — Z85038 Personal history of other malignant neoplasm of large intestine: Secondary | ICD-10-CM | POA: Insufficient documentation

## 2010-09-22 DIAGNOSIS — R109 Unspecified abdominal pain: Secondary | ICD-10-CM | POA: Insufficient documentation

## 2010-09-22 DIAGNOSIS — N289 Disorder of kidney and ureter, unspecified: Secondary | ICD-10-CM | POA: Insufficient documentation

## 2010-09-22 DIAGNOSIS — E119 Type 2 diabetes mellitus without complications: Secondary | ICD-10-CM | POA: Insufficient documentation

## 2010-09-22 NOTE — Assessment & Plan Note (Signed)
The pt is doing fairly well with bipap, and denies any equipment issues.  He is still having some sleepiness issues during the day which has been an ongoing problem.  We have never really gotten his pressure to its therapeutic level due to intolerance and mask leaks, but would like to see if we can do so now that he has become more accustomed to the device.  Will set on auto for a few weeks and see how he does.

## 2010-09-23 ENCOUNTER — Encounter (HOSPITAL_COMMUNITY): Payer: Self-pay | Admitting: Radiology

## 2010-09-23 ENCOUNTER — Emergency Department (HOSPITAL_COMMUNITY): Payer: Medicare Other

## 2010-09-23 LAB — URINALYSIS, ROUTINE W REFLEX MICROSCOPIC
Bilirubin Urine: NEGATIVE
Glucose, UA: 250 mg/dL — AB
Ketones, ur: NEGATIVE mg/dL
Leukocytes, UA: NEGATIVE
pH: 7.5 (ref 5.0–8.0)

## 2010-09-23 LAB — CBC
Platelets: 314 10*3/uL (ref 150–400)
RBC: 3.3 MIL/uL — ABNORMAL LOW (ref 4.22–5.81)
RDW: 13.4 % (ref 11.5–15.5)
WBC: 7.8 10*3/uL (ref 4.0–10.5)

## 2010-09-23 LAB — COMPREHENSIVE METABOLIC PANEL
ALT: 15 U/L (ref 0–53)
AST: 23 U/L (ref 0–37)
Albumin: 3.8 g/dL (ref 3.5–5.2)
Alkaline Phosphatase: 41 U/L (ref 39–117)
Chloride: 102 mEq/L (ref 96–112)
Potassium: 4.5 mEq/L (ref 3.5–5.1)
Total Bilirubin: 0.3 mg/dL (ref 0.3–1.2)

## 2010-09-23 LAB — URINE MICROSCOPIC-ADD ON

## 2010-09-23 LAB — DIFFERENTIAL
Basophils Absolute: 0 10*3/uL (ref 0.0–0.1)
Eosinophils Absolute: 0 10*3/uL (ref 0.0–0.7)
Eosinophils Relative: 0 % (ref 0–5)
Lymphocytes Relative: 9 % — ABNORMAL LOW (ref 12–46)
Neutrophils Relative %: 89 % — ABNORMAL HIGH (ref 43–77)

## 2010-09-24 LAB — URINE CULTURE: Culture  Setup Time: 201207201202

## 2010-09-26 ENCOUNTER — Encounter: Payer: Self-pay | Admitting: Internal Medicine

## 2010-09-26 ENCOUNTER — Ambulatory Visit (INDEPENDENT_AMBULATORY_CARE_PROVIDER_SITE_OTHER): Payer: Medicare Other | Admitting: Internal Medicine

## 2010-09-26 ENCOUNTER — Other Ambulatory Visit (INDEPENDENT_AMBULATORY_CARE_PROVIDER_SITE_OTHER): Payer: Medicare Other

## 2010-09-26 VITALS — BP 140/70 | HR 63 | Temp 99.1°F | Ht 68.0 in | Wt 169.2 lb

## 2010-09-26 DIAGNOSIS — D649 Anemia, unspecified: Secondary | ICD-10-CM

## 2010-09-26 DIAGNOSIS — R112 Nausea with vomiting, unspecified: Secondary | ICD-10-CM | POA: Insufficient documentation

## 2010-09-26 DIAGNOSIS — N289 Disorder of kidney and ureter, unspecified: Secondary | ICD-10-CM

## 2010-09-26 DIAGNOSIS — J209 Acute bronchitis, unspecified: Secondary | ICD-10-CM

## 2010-09-26 LAB — H. PYLORI ANTIBODY, IGG: H Pylori IgG: NEGATIVE

## 2010-09-26 LAB — CBC WITH DIFFERENTIAL/PLATELET
Basophils Relative: 0.5 % (ref 0.0–3.0)
Eosinophils Relative: 0.4 % (ref 0.0–5.0)
HCT: 31.2 % — ABNORMAL LOW (ref 39.0–52.0)
Lymphs Abs: 1 10*3/uL (ref 0.7–4.0)
MCV: 96.1 fl (ref 78.0–100.0)
Monocytes Absolute: 0.5 10*3/uL (ref 0.1–1.0)
Platelets: 317 10*3/uL (ref 150.0–400.0)
WBC: 6.4 10*3/uL (ref 4.5–10.5)

## 2010-09-26 LAB — BASIC METABOLIC PANEL
CO2: 26 mEq/L (ref 19–32)
Calcium: 8.7 mg/dL (ref 8.4–10.5)
Creatinine, Ser: 2.2 mg/dL — ABNORMAL HIGH (ref 0.4–1.5)
Glucose, Bld: 91 mg/dL (ref 70–99)

## 2010-09-26 MED ORDER — ONDANSETRON HCL 4 MG PO TABS: 4.0000 mg | ORAL_TABLET | Freq: Three times a day (TID) | ORAL | Status: DC | PRN

## 2010-09-26 NOTE — Assessment & Plan Note (Signed)
Recent hgb 10.7 per echart, pt requests repeat as he is worried about "internal bleeding", no overt bleeding at this time

## 2010-09-26 NOTE — Assessment & Plan Note (Signed)
Acute on chronic, with cr from 2.1 to 2.8 per echart at the ER, for repeat now, likely volume related I suspect related to the n/v episode

## 2010-09-26 NOTE — Patient Instructions (Addendum)
Take all new medications as prescribed - the odansetron for vomiting and nausea Continue all other medications as before Please go to LAB in the Basement for the blood and/or urine tests to be done today Please call the phone number [phone] (the PhoneTree System) for results of testing in 2-3 days;  When calling, simply dial the number, and when prompted enter the MRN number above (the Medical Record Number) and the # key, then the message should start.

## 2010-09-26 NOTE — Assessment & Plan Note (Signed)
Improved, reassured, no further tx needed at this time

## 2010-09-26 NOTE — Assessment & Plan Note (Addendum)
Recurrent episodic for unclear etiology;  For zofran prn to continue, ok to advance diet, wife requests H pylori as she believes he has not been tested

## 2010-09-26 NOTE — Progress Notes (Signed)
Subjective:    Patient ID: Nathan Blackwell, male    DOB: Sep 18, 1920, 77 y.o.   MRN: [id number]  HPI  Here to f/u; bronchitic symtpoms has marked improved with resolved cough; did have another episode of n/v, seen in ER where zofran seemed to help quite nicely, though was found to have mild worsening acute chronic renal dz,  Ready to increase his diet - has been sticking to liquid diet since the ER July 20,  No abd pain, GU symtpoms or further GI symptoms such as bowel change.  No overt bleeding.  Wife inquires about H pylori status, and believes not tested in many yrs.  Pt denies chest pain, increased sob or doe, wheezing, orthopnea, PND, increased LE swelling, palpitations, dizziness or syncope.  Pt denies new neurological symptoms such as new headache, or facial or extremity weakness or numbness   Pt denies polydipsia, polyuria. Past Medical History  Diagnosis Date  . Diabetes mellitus type II   . Hyperlipidemia   . Hypertension   . Osteoporosis   . Prostatic hypertrophy     benign  . OSA (obstructive sleep apnea)   . Rotator cuff syndrome     chronic  . Elevated PSA   . Hypothyroidism   . Nephrolithiasis   . Thyroid nodule   . GERD (gastroesophageal reflux disease)   . Colitis, Clostridium difficile     presumed 11/08  . Dementia   . Anemia     NOS  . Vitamin B 12 deficiency   . BRADYCARDIA 06/10/2009  . COLON CANCER, HX OF 09/20/2006  . HYPOTHYROIDISM 01/14/2007  . DIABETES MELLITUS, TYPE II 09/20/2006  . HYPERLIPIDEMIA 09/20/2006  . ANEMIA-NOS 06/23/2008  . DEMENTIA 05/14/2007  . DEPRESSION 02/27/2007  . SLEEP APNEA, OBSTRUCTIVE 01/14/2007  . HYPERTENSION 09/20/2006  . GERD 01/14/2007  . OSTEOPOROSIS 10/08/2006  . PERIPHERAL EDEMA 09/03/2007  . RENAL INSUFFICIENCY 04/01/2010  . B12 deficiency 05/25/2010  . CLOSTRIDIUM DIFFICILE COLITIS 01/30/2007  . THYROID NODULE 01/14/2007  . CONSTIPATION, RECURRENT 01/06/2010  . BENIGN PROSTATIC HYPERTROPHY 10/08/2006  . ARTHRITIS 02/23/2007  .  BACK PAIN, THORACIC REGION 06/04/2008  . COLONIC POLYPS, HX OF 10/08/2006  . NEPHROLITHIASIS, HX OF 01/14/2007  . Cancer of colon dx'd 1998  . Cancer of vocal cord dx'd 1998  . NEOP, MALIGNANT, GLOTTIS 09/20/2006   Past Surgical History  Procedure Date  . Cholecystectomy 1999  . Inguinal herniorrhapy 1984  . Appendectomy 1998  . Colon resection 1998    reports that he has never smoked. He does not have any smokeless tobacco history on file. He reports that he does not drink alcohol. His drug history not on file. family history includes Diabetes in his mother and Stroke in his father. Allergies  Allergen Reactions  . Biaxin (Clarithromycin)    Current Outpatient Prescriptions on File Prior to Visit  Medication Sig Dispense Refill  . acetaminophen (TYLENOL) 500 MG tablet Take 500 mg by mouth every 6 (six) hours as needed.        Marland Kitchen albuterol (PROAIR HFA) 108 (90 BASE) MCG/ACT inhaler Inhale 2 puffs into the lungs every 6 (six) hours as needed.        . ALPRAZolam (XANAX) 0.25 MG tablet Take 0.25 mg by mouth 2 (two) times daily.        Marland Kitchen amLODipine-olmesartan (AZOR) 5-40 MG per tablet Take 1 tablet by mouth daily.        Marland Kitchen buPROPion (WELLBUTRIN XL) 150 MG 24 hr tablet TAKE 1  TABLET BY MOUTH EVERY DAY  30 tablet  11  . Cholecalciferol (VITAMIN D3) 1000 UNIT capsule Take 1,000 Units by mouth daily.        . citalopram (CELEXA) 20 MG tablet Take 20 mg by mouth daily.        . cyanocobalamin 1000 MCG/ML injection Inject 1,000 mcg into the muscle every 30 (thirty) days.        . diphenoxylate-atropine (LOMOTIL) 2.5-0.025 MG per tablet Take 1 tablet by mouth as directed. 1 by mouth as needed loose stool, max 8 tabs per 24 hours.       . fenofibrate 160 MG tablet TAKE 1 TABLET BY MOUTH ONCE A DAY  30 tablet  11  . ferrous sulfate 325 (65 FE) MG tablet Take 325 mg by mouth daily.        . furosemide (LASIX) 20 MG tablet Take 20 mg by mouth daily.       Marland Kitchen glucose blood (ONE TOUCH TEST STRIPS) test  strip 1 each by Other route as directed. Use 1 strip once a day - dx code 250.0       . hydrALAZINE (APRESOLINE) 25 MG tablet Take 1 tablet (25 mg total) by mouth 3 (three) times daily.  90 tablet  11  . lansoprazole (PREVACID) 30 MG capsule TAKE 1 CAPSULE BY MOUTH EVERY DAY  30 capsule  11  . levofloxacin (LEVAQUIN) 250 MG tablet Take 1 tablet (250 mg total) by mouth daily.  7 tablet  0  . levothyroxine (SYNTHROID, LEVOTHROID) 100 MCG tablet Take 100 mcg by mouth daily.        . Lidocaine-Hydrocortisone Ace 3-0.5 % CREA Apply twice per day for 7 days       . lovastatin (MEVACOR) 40 MG tablet Take 40 mg by mouth daily.        Marland Kitchen OLANZapine (ZYPREXA) 5 MG tablet Take 5 mg by mouth daily.        . ondansetron (ZOFRAN) 4 MG tablet Take 4 mg by mouth as directed. 1 po every 6 hours as needed for nausea       . Pancrelipase, Lip-Prot-Amyl, (CREON) 12000 UNIT CPEP Take by mouth 3 (three) times daily before meals.        . polyethylene glycol powder (MIRALAX) powder Take 17 g by mouth 2 (two) times daily.        . sitaGLIPtan (JANUVIA) 50 MG tablet Take 1 tablet (50 mg total) by mouth daily.  30 tablet  11  . Tamsulosin HCl (FLOMAX) 0.4 MG CAPS Take 0.4 mg by mouth at bedtime.        . meclizine (ANTIVERT) 25 MG tablet Take 25 mg by mouth as directed. 1/2 - 1 by mouth every 6 hours as needed for dizziness        Review of Systems Review of Systems  Constitutional: Negative for diaphoresis and unexpected weight change.  HENT: Negative for drooling and tinnitus.   Eyes: Negative for photophobia and visual disturbance.  Respiratory: Negative for choking and stridor.   Gastrointestinal: Negative for vomiting and blood in stool.  Genitourinary: Negative for hematuria and decreased urine volume.      Objective:   Physical Exam BP 140/70  Pulse 63  Temp(Src) 99.1 F (37.3 C) (Oral)  Ht 5\' 8"  (1.727 m)  Wt 169 lb 4 oz (76.771 kg)  BMI 25.73 kg/m2  SpO2 97% Physical Exam  VS noted, not ill  appearing Constitutional: Pt appears well-developed and well-nourished.  HENT: Head: Normocephalic.  Right Ear: External ear normal.  Left Ear: External ear normal.  Eyes: Conjunctivae and EOM are normal. Pupils are equal, round, and reactive to light.  Neck: Normal range of motion. Neck supple.  Cardiovascular: Normal rate and regular rhythm.   Pulmonary/Chest: Effort normal and breath sounds normal.  Abd:  Soft, NT, non-distended, + BS Neurological: Pt is alert. No cranial nerve deficit.  Skin: Skin is warm. No erythema.  Psychiatric: Pt behavior is normal. Thought content c/w mild dementia        Assessment & Plan:

## 2010-09-29 ENCOUNTER — Telehealth: Payer: Self-pay | Admitting: Pulmonary Disease

## 2010-09-29 NOTE — Telephone Encounter (Signed)
Called [facility] in [place] and spoke with Beth about Transport planner. Beth states that she has tried calling multiple times and hasn't received a return call back from the patient. Checked phone number and Waynetta Sandy is calling the correct phone number. Asked Beth to call patient now because patient just called and left this message. Will allow time for Beth to contact patient, and I will place a call to patient.

## 2010-09-29 NOTE — Telephone Encounter (Signed)
Called and spoke to patient. He stated that he did speak with Beth from [facility] and they will be coming by his house tonight to set [employer] up. Advised patient to call us back if for some reason [facility] does not set him up tonight.

## 2010-10-10 ENCOUNTER — Other Ambulatory Visit: Payer: Self-pay | Admitting: Internal Medicine

## 2010-10-10 ENCOUNTER — Other Ambulatory Visit: Payer: Self-pay | Admitting: Gastroenterology

## 2010-10-18 ENCOUNTER — Other Ambulatory Visit: Payer: Self-pay | Admitting: Internal Medicine

## 2010-10-18 ENCOUNTER — Ambulatory Visit (INDEPENDENT_AMBULATORY_CARE_PROVIDER_SITE_OTHER): Payer: Medicare Other | Admitting: Internal Medicine

## 2010-10-18 ENCOUNTER — Other Ambulatory Visit (INDEPENDENT_AMBULATORY_CARE_PROVIDER_SITE_OTHER): Payer: Medicare Other

## 2010-10-18 ENCOUNTER — Encounter: Payer: Self-pay | Admitting: Internal Medicine

## 2010-10-18 VITALS — BP 140/62 | HR 66 | Temp 98.1°F | Ht 66.0 in | Wt 175.0 lb

## 2010-10-18 DIAGNOSIS — R490 Dysphonia: Secondary | ICD-10-CM

## 2010-10-18 DIAGNOSIS — E119 Type 2 diabetes mellitus without complications: Secondary | ICD-10-CM

## 2010-10-18 DIAGNOSIS — R05 Cough: Secondary | ICD-10-CM

## 2010-10-18 DIAGNOSIS — I10 Essential (primary) hypertension: Secondary | ICD-10-CM

## 2010-10-18 DIAGNOSIS — J309 Allergic rhinitis, unspecified: Secondary | ICD-10-CM | POA: Insufficient documentation

## 2010-10-18 MED ORDER — FEXOFENADINE HCL 180 MG PO TABS: 180.0000 mg | ORAL_TABLET | Freq: Every day | ORAL | Status: DC

## 2010-10-18 MED ORDER — METHYLPREDNISOLONE ACETATE 80 MG/ML IJ SUSP
120.0000 mg | Freq: Once | INTRAMUSCULAR | Status: AC
  Administered 2010-10-18: 120 mg via INTRAMUSCULAR

## 2010-10-18 MED ORDER — FLUTICASONE PROPIONATE 50 MCG/ACT NA SUSP: 2.0000 | Freq: Every day | NASAL | Status: DC

## 2010-10-18 MED ORDER — PREDNISONE 20 MG PO TABS: 20.0000 mg | ORAL_TABLET | Freq: Every day | ORAL | Status: AC

## 2010-10-18 NOTE — Assessment & Plan Note (Signed)
stable overall by hx and exam, most recent data reviewed with pt, and pt to continue medical treatment as before  Lab Results  Component Value Date   HGBA1C 6.7* 11/18/2009

## 2010-10-18 NOTE — Assessment & Plan Note (Signed)
For depomedrol IM today, and allegra/flonase asd to hopefully control post predpack as well

## 2010-10-18 NOTE — Patient Instructions (Addendum)
You had the steroid shot today Take all new medications as prescribed - the prednisone first, then after that you can take generic allegra and flonase to keep the post nasal drip more controlled, with then less cough and hoarseness Please call in 1 week if the cough and hoarseness are not improved, as then you would need to be referred to ENT Continue all other medications as before Please go to LAB in the Basement for the blood and/or urine tests to be done today Please call the phone number [phone] (the [employer]Tree System) for results of testing in 2-3 days;  When calling, simply dial the number, and when prompted enter the MRN number above (the Medical Record Number) and the # key, then the message should start.  You can cancel the Aug 23 appt since you were here today

## 2010-10-18 NOTE — Assessment & Plan Note (Signed)
stable overall by hx and exam, most recent data reviewed with pt, and pt to continue medical treatment as before  BP Readings from Last 3 Encounters:  10/18/10 140/62  09/26/10 140/70  09/20/10 100/54

## 2010-10-18 NOTE — Progress Notes (Signed)
Subjective:    Patient ID: Nathan Blackwell, male    DOB: November 06, 1920, 77 y.o.   MRN: [id number]  HPI  Here with 2-3 months mild persistent cough , now worse with hoarseness in the past 2 wks - Does have several wks ongoing nasal allergy symptoms with clear congestion, itch and sneeze, without fever, pain, ST, cough or wheezing.  .Pt denies chest pain, increased sob or doe, wheezing, orthopnea, PND, increased LE swelling, palpitations, dizziness or syncope.  Pt denies new neurological symptoms such as new headache, or facial or extremity weakness or numbness   Pt denies polydipsia, polyuria.   Pt denies fever, wt loss, night sweats, loss of appetite, or other constitutional symptoms  Overall good compliance with treatment, and good medicine tolerability. Past Medical History  Diagnosis Date  . Diabetes mellitus type II   . Hyperlipidemia   . Hypertension   . Osteoporosis   . Prostatic hypertrophy     benign  . OSA (obstructive sleep apnea)   . Rotator cuff syndrome     chronic  . Elevated PSA   . Hypothyroidism   . Nephrolithiasis   . Thyroid nodule   . GERD (gastroesophageal reflux disease)   . Colitis, Clostridium difficile     presumed 11/08  . Dementia   . Anemia     NOS  . Vitamin B 12 deficiency   . BRADYCARDIA 06/10/2009  . COLON CANCER, HX OF 09/20/2006  . HYPOTHYROIDISM 01/14/2007  . DIABETES MELLITUS, TYPE II 09/20/2006  . HYPERLIPIDEMIA 09/20/2006  . ANEMIA-NOS 06/23/2008  . DEMENTIA 05/14/2007  . DEPRESSION 02/27/2007  . SLEEP APNEA, OBSTRUCTIVE 01/14/2007  . HYPERTENSION 09/20/2006  . GERD 01/14/2007  . OSTEOPOROSIS 10/08/2006  . PERIPHERAL EDEMA 09/03/2007  . RENAL INSUFFICIENCY 04/01/2010  . B12 deficiency 05/25/2010  . CLOSTRIDIUM DIFFICILE COLITIS 01/30/2007  . THYROID NODULE 01/14/2007  . CONSTIPATION, RECURRENT 01/06/2010  . BENIGN PROSTATIC HYPERTROPHY 10/08/2006  . ARTHRITIS 02/23/2007  . BACK PAIN, THORACIC REGION 06/04/2008  . COLONIC POLYPS, HX OF 10/08/2006  .  NEPHROLITHIASIS, HX OF 01/14/2007  . Cancer of colon dx'd 1998  . Cancer of vocal cord dx'd 1998  . NEOP, MALIGNANT, GLOTTIS 09/20/2006   Past Surgical History  Procedure Date  . Cholecystectomy 1999  . Inguinal herniorrhapy 1984  . Appendectomy 1998  . Colon resection 1998    reports that he has never smoked. He does not have any smokeless tobacco history on file. He reports that he does not drink alcohol. His drug history not on file. family history includes Diabetes in his mother and Stroke in his father. Allergies  Allergen Reactions  . Biaxin (Clarithromycin)    Current Outpatient Prescriptions on File Prior to Visit  Medication Sig Dispense Refill  . acetaminophen (TYLENOL) 500 MG tablet Take 500 mg by mouth every 6 (six) hours as needed.        Marland Kitchen albuterol (PROAIR HFA) 108 (90 BASE) MCG/ACT inhaler Inhale 2 puffs into the lungs every 6 (six) hours as needed.        . ALPRAZolam (XANAX) 0.25 MG tablet Take 0.25 mg by mouth 2 (two) times daily.        Marland Kitchen amLODipine-olmesartan (AZOR) 5-40 MG per tablet Take 1 tablet by mouth daily.        Marland Kitchen buPROPion (WELLBUTRIN XL) 150 MG 24 hr tablet TAKE 1 TABLET BY MOUTH EVERY DAY  30 tablet  11  . Cholecalciferol (VITAMIN D3) 1000 UNIT capsule Take 1,000 Units by mouth  daily.        . citalopram (CELEXA) 20 MG tablet TAKE 1 TABLET BY MOUTH ONCE A DAY  30 tablet  11  . CREON 12000 UNITS CPEP TAKE 1 TO 2 CAPSULES BY MOUTH THREE TIMES A DAY WITH EACH MEAL  180 capsule  3  . cyanocobalamin 1000 MCG/ML injection Inject 1,000 mcg into the muscle every 30 (thirty) days.        . diphenoxylate-atropine (LOMOTIL) 2.5-0.025 MG per tablet Take 1 tablet by mouth as directed. 1 by mouth as needed loose stool, max 8 tabs per 24 hours.       . fenofibrate 160 MG tablet TAKE 1 TABLET BY MOUTH ONCE A DAY  30 tablet  11  . ferrous sulfate 325 (65 FE) MG tablet Take 325 mg by mouth daily.        . furosemide (LASIX) 20 MG tablet Take 20 mg by mouth daily.         Marland Kitchen glucose blood (ONE TOUCH TEST STRIPS) test strip 1 each by Other route as directed. Use 1 strip once a day - dx code 250.0       . hydrALAZINE (APRESOLINE) 25 MG tablet Take 1 tablet (25 mg total) by mouth 3 (three) times daily.  90 tablet  11  . lansoprazole (PREVACID) 30 MG capsule TAKE 1 CAPSULE BY MOUTH EVERY DAY  30 capsule  11  . levothyroxine (SYNTHROID, LEVOTHROID) 100 MCG tablet Take 100 mcg by mouth daily.        . Lidocaine-Hydrocortisone Ace 3-0.5 % CREA Apply twice per day for 7 days       . lovastatin (MEVACOR) 40 MG tablet Take 40 mg by mouth daily.        . meclizine (ANTIVERT) 25 MG tablet Take 25 mg by mouth as directed. 1/2 - 1 by mouth every 6 hours as needed for dizziness       . OLANZapine (ZYPREXA) 5 MG tablet Take 5 mg by mouth daily.        . ondansetron (ZOFRAN) 4 MG tablet Take 4 mg by mouth as directed. 1 po every 6 hours as needed for nausea       . ondansetron (ZOFRAN) 4 MG tablet Take 1 tablet (4 mg total) by mouth every 8 (eight) hours as needed for nausea.  30 tablet  2  . polyethylene glycol powder (MIRALAX) powder Take 17 g by mouth 2 (two) times daily.        . sitaGLIPtan (JANUVIA) 50 MG tablet Take 1 tablet (50 mg total) by mouth daily.  30 tablet  11  . Tamsulosin HCl (FLOMAX) 0.4 MG CAPS Take 0.4 mg by mouth at bedtime.         No current facility-administered medications on file prior to visit.   Review of Systems Review of Systems  Constitutional: Negative for diaphoresis and unexpected weight change.  HENT: Negative for drooling and tinnitus.   Eyes: Negative for photophobia and visual disturbance.  Respiratory: Negative for choking and stridor.   Gastrointestinal: Negative for vomiting and blood in stool.        Objective:   Physical Exam BP 140/62  Pulse 66  Temp(Src) 98.1 F (36.7 C) (Oral)  Ht 5\' 6"  (1.676 m)  Wt 175 lb (79.379 kg)  BMI 28.25 kg/m2  SpO2 97% Physical Exam  VS noted Constitutional: Pt appears well-developed and  well-nourished.  HENT: Head: Normocephalic.  Right Ear: External ear normal.  Left Ear: External  ear normal.  Bilat tm's mild erythema.  Sinus nontender.  Pharynx mild erythema Eyes: Conjunctivae and EOM are normal. Pupils are equal, round, and reactive to light.  Neck: Normal range of motion. Neck supple.  Cardiovascular: Normal rate and regular rhythm.   Pulmonary/Chest: Effort normal and breath sounds normal.  Neurological: Pt is alert. No cranial nerve deficit.  Skin: Skin is warm. No erythema.  Psychiatric: Pt behavior is normal. Thought content normal.         Assessment & Plan:

## 2010-10-18 NOTE — Assessment & Plan Note (Signed)
I suspect related to post nasal gtt due to allergies - see allergy discussion today

## 2010-10-18 NOTE — Assessment & Plan Note (Addendum)
I suspect related to the cough and post nasal gtt - see tx as per allergies today, will need ENT f/u if not improved and/or cxr with his hx of vocal cord CA

## 2010-10-19 LAB — BASIC METABOLIC PANEL
CO2: 27 mEq/L (ref 19–32)
Calcium: 9.2 mg/dL (ref 8.4–10.5)
Creatinine, Ser: 2.5 mg/dL — ABNORMAL HIGH (ref 0.4–1.5)
GFR: 26.52 mL/min — ABNORMAL LOW (ref >60.00)
Sodium: 139 mEq/L (ref 135–145)

## 2010-10-19 LAB — LIPID PANEL
Cholesterol: 185 mg/dL (ref 0–200)
HDL: 37.1 mg/dL — ABNORMAL LOW (ref >39.00)
Total CHOL/HDL Ratio: 5
Triglycerides: 258 mg/dL — ABNORMAL HIGH (ref 0.0–149.0)
VLDL: 51.6 mg/dL — ABNORMAL HIGH (ref 0.0–40.0)

## 2010-10-19 LAB — LDL CHOLESTEROL, DIRECT: Direct LDL: 118.3 mg/dL

## 2010-10-27 ENCOUNTER — Ambulatory Visit: Payer: Medicare Other | Admitting: Internal Medicine

## 2010-10-27 ENCOUNTER — Ambulatory Visit (INDEPENDENT_AMBULATORY_CARE_PROVIDER_SITE_OTHER): Payer: Medicare Other

## 2010-10-27 DIAGNOSIS — E538 Deficiency of other specified B group vitamins: Secondary | ICD-10-CM

## 2010-10-27 MED ORDER — CYANOCOBALAMIN 1000 MCG/ML IJ SOLN
1000.0000 ug | Freq: Once | INTRAMUSCULAR | Status: AC
  Administered 2010-10-27: 1000 ug via INTRAMUSCULAR

## 2010-11-14 ENCOUNTER — Other Ambulatory Visit: Payer: Self-pay | Admitting: Internal Medicine

## 2010-11-29 ENCOUNTER — Ambulatory Visit (INDEPENDENT_AMBULATORY_CARE_PROVIDER_SITE_OTHER): Payer: Medicare Other

## 2010-11-29 DIAGNOSIS — E538 Deficiency of other specified B group vitamins: Secondary | ICD-10-CM

## 2010-11-29 MED ORDER — CYANOCOBALAMIN 1000 MCG/ML IJ SOLN
1000.0000 ug | Freq: Once | INTRAMUSCULAR | Status: AC
  Administered 2010-11-29: 1000 ug via INTRAMUSCULAR

## 2010-12-02 LAB — GLUCOSE, CAPILLARY: Glucose-Capillary: 122 — ABNORMAL HIGH

## 2010-12-06 ENCOUNTER — Ambulatory Visit (INDEPENDENT_AMBULATORY_CARE_PROVIDER_SITE_OTHER): Payer: Medicare Other

## 2010-12-06 ENCOUNTER — Ambulatory Visit: Payer: Medicare Other

## 2010-12-06 DIAGNOSIS — Z23 Encounter for immunization: Secondary | ICD-10-CM

## 2010-12-09 LAB — CBC
HCT: 34.9 — ABNORMAL LOW
MCHC: 35.4
MCV: 95.8
Platelets: 301
RDW: 12.8

## 2010-12-09 LAB — URINALYSIS, ROUTINE W REFLEX MICROSCOPIC
Bilirubin Urine: NEGATIVE
Nitrite: NEGATIVE
Specific Gravity, Urine: 1.009
Urobilinogen, UA: 0.2

## 2010-12-09 LAB — BASIC METABOLIC PANEL
BUN: 16
CO2: 26
Chloride: 103
GFR calc non Af Amer: >60
Glucose, Bld: 183 — ABNORMAL HIGH
Potassium: 3.7

## 2010-12-09 LAB — POCT CARDIAC MARKERS: Operator id: 4661

## 2010-12-09 LAB — DIFFERENTIAL
Basophils Absolute: 0
Basophils Relative: 0
Eosinophils Absolute: 0 — ABNORMAL LOW
Eosinophils Relative: 1

## 2010-12-09 LAB — URINE MICROSCOPIC-ADD ON

## 2010-12-13 LAB — TSH: TSH: 2.04

## 2010-12-13 LAB — URINE CULTURE
Colony Count: NO GROWTH
Culture: NO GROWTH
Special Requests: NEGATIVE

## 2010-12-13 LAB — CBC
HCT: 31.9 — ABNORMAL LOW
HCT: 35.9 — ABNORMAL LOW
Hemoglobin: 11.2 — ABNORMAL LOW
MCHC: 35.1
MCHC: 35.7
MCHC: 35.9
MCV: 95.5
MCV: 95.8
Platelets: 242
Platelets: 301
RBC: 3.32 — ABNORMAL LOW
RBC: 3.75 — ABNORMAL LOW

## 2010-12-13 LAB — OCCULT BLOOD X 1 CARD TO LAB, STOOL
Fecal Occult Bld: POSITIVE
Fecal Occult Bld: POSITIVE

## 2010-12-13 LAB — COMPREHENSIVE METABOLIC PANEL
ALT: 15
AST: 21
CO2: 20
Calcium: 10.4
Chloride: 109
GFR calc Af Amer: 49 — ABNORMAL LOW
GFR calc non Af Amer: 41 — ABNORMAL LOW
Glucose, Bld: 112 — ABNORMAL HIGH
Sodium: 140
Total Bilirubin: 1

## 2010-12-13 LAB — BASIC METABOLIC PANEL
BUN: 14
BUN: 15
CO2: 21
CO2: 22
CO2: 23
Calcium: 8.3 — ABNORMAL LOW
Chloride: 112
Chloride: 114 — ABNORMAL HIGH
Creatinine, Ser: 0.83
Creatinine, Ser: 1.09
GFR calc Af Amer: >60
Glucose, Bld: 93
Potassium: 3.9
Sodium: 136

## 2010-12-13 LAB — URINALYSIS, ROUTINE W REFLEX MICROSCOPIC
Bilirubin Urine: NEGATIVE
Glucose, UA: NEGATIVE
Ketones, ur: NEGATIVE
Specific Gravity, Urine: 1.011
pH: 6.5

## 2010-12-13 LAB — CK TOTAL AND CKMB (NOT AT ARMC)
Relative Index: INVALID
Relative Index: INVALID
Relative Index: INVALID
Total CK: 42

## 2010-12-13 LAB — BLOOD GAS, ARTERIAL
Acid-base deficit: 2.8 — ABNORMAL HIGH
Drawn by: 295031
O2 Content: 2
pCO2 arterial: 21.6 — ABNORMAL LOW
pH, Arterial: 7.533 — ABNORMAL HIGH
pO2, Arterial: 151 — ABNORMAL HIGH

## 2010-12-13 LAB — MAGNESIUM: Magnesium: 2

## 2010-12-13 LAB — HEMOGLOBIN A1C: Mean Plasma Glucose: 165

## 2010-12-13 LAB — CLOSTRIDIUM DIFFICILE EIA

## 2010-12-13 LAB — BILIRUBIN, DIRECT: Bilirubin, Direct: 0.2

## 2011-01-01 ENCOUNTER — Emergency Department (HOSPITAL_COMMUNITY): Payer: Medicare Other

## 2011-01-01 ENCOUNTER — Inpatient Hospital Stay (HOSPITAL_COMMUNITY)
Admission: EM | Admit: 2011-01-01 | Discharge: 2011-01-08 | DRG: 300 | Disposition: A | Discharge status: Home Health | Payer: Medicare Other | Attending: Internal Medicine | Admitting: Internal Medicine

## 2011-01-01 DIAGNOSIS — N289 Disorder of kidney and ureter, unspecified: Secondary | ICD-10-CM | POA: Diagnosis present

## 2011-01-01 DIAGNOSIS — M810 Age-related osteoporosis without current pathological fracture: Secondary | ICD-10-CM | POA: Diagnosis present

## 2011-01-01 DIAGNOSIS — I10 Essential (primary) hypertension: Secondary | ICD-10-CM | POA: Diagnosis present

## 2011-01-01 DIAGNOSIS — F0390 Unspecified dementia without behavioral disturbance: Secondary | ICD-10-CM | POA: Diagnosis present

## 2011-01-01 DIAGNOSIS — R112 Nausea with vomiting, unspecified: Secondary | ICD-10-CM | POA: Diagnosis present

## 2011-01-01 DIAGNOSIS — N184 Chronic kidney disease, stage 4 (severe): Secondary | ICD-10-CM | POA: Diagnosis present

## 2011-01-01 DIAGNOSIS — G4733 Obstructive sleep apnea (adult) (pediatric): Secondary | ICD-10-CM | POA: Diagnosis present

## 2011-01-01 DIAGNOSIS — I129 Hypertensive chronic kidney disease with stage 1 through stage 4 chronic kidney disease, or unspecified chronic kidney disease: Secondary | ICD-10-CM | POA: Diagnosis present

## 2011-01-01 DIAGNOSIS — D649 Anemia, unspecified: Secondary | ICD-10-CM | POA: Diagnosis present

## 2011-01-01 DIAGNOSIS — Z7982 Long term (current) use of aspirin: Secondary | ICD-10-CM

## 2011-01-01 DIAGNOSIS — K219 Gastro-esophageal reflux disease without esophagitis: Secondary | ICD-10-CM | POA: Diagnosis present

## 2011-01-01 DIAGNOSIS — N183 Chronic kidney disease, stage 3 unspecified: Secondary | ICD-10-CM | POA: Diagnosis not present

## 2011-01-01 DIAGNOSIS — N400 Enlarged prostate without lower urinary tract symptoms: Secondary | ICD-10-CM | POA: Diagnosis present

## 2011-01-01 DIAGNOSIS — Z85038 Personal history of other malignant neoplasm of large intestine: Secondary | ICD-10-CM

## 2011-01-01 DIAGNOSIS — I824Z9 Acute embolism and thrombosis of unspecified deep veins of unspecified distal lower extremity: Principal | ICD-10-CM | POA: Diagnosis present

## 2011-01-01 DIAGNOSIS — K861 Other chronic pancreatitis: Secondary | ICD-10-CM | POA: Diagnosis present

## 2011-01-01 DIAGNOSIS — E039 Hypothyroidism, unspecified: Secondary | ICD-10-CM | POA: Diagnosis present

## 2011-01-01 DIAGNOSIS — E785 Hyperlipidemia, unspecified: Secondary | ICD-10-CM | POA: Diagnosis present

## 2011-01-01 DIAGNOSIS — I82409 Acute embolism and thrombosis of unspecified deep veins of unspecified lower extremity: Secondary | ICD-10-CM | POA: Diagnosis present

## 2011-01-01 DIAGNOSIS — M7989 Other specified soft tissue disorders: Secondary | ICD-10-CM

## 2011-01-01 DIAGNOSIS — F068 Other specified mental disorders due to known physiological condition: Secondary | ICD-10-CM

## 2011-01-01 DIAGNOSIS — E119 Type 2 diabetes mellitus without complications: Secondary | ICD-10-CM | POA: Diagnosis present

## 2011-01-01 DIAGNOSIS — Z8521 Personal history of malignant neoplasm of larynx: Secondary | ICD-10-CM

## 2011-01-01 DIAGNOSIS — R609 Edema, unspecified: Secondary | ICD-10-CM | POA: Diagnosis present

## 2011-01-01 DIAGNOSIS — F3289 Other specified depressive episodes: Secondary | ICD-10-CM | POA: Diagnosis present

## 2011-01-01 DIAGNOSIS — Z7901 Long term (current) use of anticoagulants: Secondary | ICD-10-CM

## 2011-01-01 DIAGNOSIS — F329 Major depressive disorder, single episode, unspecified: Secondary | ICD-10-CM | POA: Diagnosis present

## 2011-01-01 LAB — DIFFERENTIAL
Basophils Absolute: 0 10*3/uL (ref 0.0–0.1)
Basophils Relative: 0 % (ref 0–1)
Monocytes Absolute: 0.6 10*3/uL (ref 0.1–1.0)
Neutro Abs: 8.5 10*3/uL — ABNORMAL HIGH (ref 1.7–7.7)
Neutrophils Relative %: 84 % — ABNORMAL HIGH (ref 43–77)

## 2011-01-01 LAB — BASIC METABOLIC PANEL
BUN: 42 mg/dL — ABNORMAL HIGH (ref 6–23)
Calcium: 10 mg/dL (ref 8.4–10.5)
Creatinine, Ser: 2.87 mg/dL — ABNORMAL HIGH (ref 0.50–1.35)
GFR calc Af Amer: 21 mL/min — ABNORMAL LOW (ref >90)
GFR calc non Af Amer: 18 mL/min — ABNORMAL LOW (ref >90)
Potassium: 4.6 mEq/L (ref 3.5–5.1)

## 2011-01-01 LAB — CBC
Hemoglobin: 11.4 g/dL — ABNORMAL LOW (ref 13.0–17.0)
MCHC: 32.6 g/dL (ref 30.0–36.0)
Platelets: 277 10*3/uL (ref 150–400)

## 2011-01-01 LAB — POCT I-STAT TROPONIN I: Troponin i, poc: 0.01 ng/mL (ref 0.00–0.08)

## 2011-01-01 LAB — PROTIME-INR
INR: 1.04 (ref 0.00–1.49)
Prothrombin Time: 13.8 seconds (ref 11.6–15.2)

## 2011-01-01 MED ORDER — TECHNETIUM TO 99M ALBUMIN AGGREGATED
6.0000 | Freq: Once | INTRAVENOUS | Status: AC | PRN
  Administered 2011-01-01: 6 via INTRAVENOUS

## 2011-01-01 MED ORDER — XENON XE 133 GAS
10.0000 | GAS_FOR_INHALATION | Freq: Once | RESPIRATORY_TRACT | Status: AC | PRN
  Administered 2011-01-01: 10 via RESPIRATORY_TRACT

## 2011-01-02 ENCOUNTER — Telehealth: Payer: Self-pay

## 2011-01-02 LAB — DIFFERENTIAL
Lymphocytes Relative: 19 % (ref 12–46)
Lymphs Abs: 1.4 10*3/uL (ref 0.7–4.0)
Monocytes Absolute: 0.5 10*3/uL (ref 0.1–1.0)
Monocytes Relative: 6 % (ref 3–12)
Neutro Abs: 5.6 10*3/uL (ref 1.7–7.7)

## 2011-01-02 LAB — CBC
HCT: 30.6 % — ABNORMAL LOW (ref 39.0–52.0)
Hemoglobin: 10 g/dL — ABNORMAL LOW (ref 13.0–17.0)
MCH: 32.1 pg (ref 26.0–34.0)
MCHC: 32.7 g/dL (ref 30.0–36.0)
MCV: 98.1 fL (ref 78.0–100.0)

## 2011-01-02 LAB — COMPREHENSIVE METABOLIC PANEL
ALT: 14 U/L (ref 0–53)
AST: 16 U/L (ref 0–37)
Alkaline Phosphatase: 36 U/L — ABNORMAL LOW (ref 39–117)
CO2: 25 mEq/L (ref 19–32)
GFR calc Af Amer: 24 mL/min — ABNORMAL LOW (ref >90)
GFR calc non Af Amer: 20 mL/min — ABNORMAL LOW (ref >90)
Glucose, Bld: 158 mg/dL — ABNORMAL HIGH (ref 70–99)
Potassium: 4 mEq/L (ref 3.5–5.1)
Sodium: 133 mEq/L — ABNORMAL LOW (ref 135–145)

## 2011-01-02 LAB — MAGNESIUM: Magnesium: 2.3 mg/dL (ref 1.5–2.5)

## 2011-01-02 LAB — CARDIAC PANEL(CRET KIN+CKTOT+MB+TROPI)
Relative Index: 1.5 (ref 0.0–2.5)
Relative Index: INVALID (ref 0.0–2.5)
Total CK: 182 U/L (ref 7–232)
Total CK: 29 U/L (ref 7–232)
Troponin I: <0.3 ng/mL (ref <0.30)

## 2011-01-02 LAB — CK TOTAL AND CKMB (NOT AT ARMC): Relative Index: INVALID (ref 0.0–2.5)

## 2011-01-02 LAB — GLUCOSE, CAPILLARY: Glucose-Capillary: 128 mg/dL — ABNORMAL HIGH (ref 70–99)

## 2011-01-02 LAB — HEMOGLOBIN A1C: Hgb A1c MFr Bld: 6.7 % — ABNORMAL HIGH (ref <5.7)

## 2011-01-02 LAB — HEPARIN LEVEL (UNFRACTIONATED): Heparin Unfractionated: 0.35 IU/mL (ref 0.30–0.70)

## 2011-01-02 NOTE — Telephone Encounter (Signed)
[facility] Triage Call Report Triage Record Num: [id number] Operator: Di Kindle Patient Name: Nathan Blackwell Call Date & Time: 01/01/2011 2:21:35PM Patient Phone: [phone] PCP: Oliver Barre Patient Gender: Male PCP Fax : [phone] Patient DOB: 12-Mar-1920 Practice Name: [facility] Reason for Call: emergent call, Wife [name], reports onset 01/01/11 of right leg is red and swollen. Shortness of breath 12/31/10, now resolved. Leg is twice the size of left leg, painful to walk. Sees a bump on that leg. Symptoms reviewed Edema, advised of need to be seen in ED [facility]. Protocol(s) Used: Edema, Atraumatic Recommended Outcome per Protocol: See ED Immediately Reason for Outcome: Swelling came on in a matter of minutes Care Advice: ~ Another adult should drive. If immediately available, consider taking an appropriate dose of a nonprescription antihistamine (e.g., Benadryl) orally as directed by the label or a pharmacist. These medications may cause drowsiness and should be taken with caution by adults 75 years or older. ~ 01/01/2011 2:27:09PM Page 1 of 1 [id number]

## 2011-01-03 LAB — GLUCOSE, CAPILLARY
Glucose-Capillary: 113 mg/dL — ABNORMAL HIGH (ref 70–99)
Glucose-Capillary: 164 mg/dL — ABNORMAL HIGH (ref 70–99)

## 2011-01-03 LAB — COMPREHENSIVE METABOLIC PANEL
Alkaline Phosphatase: 31 U/L — ABNORMAL LOW (ref 39–117)
BUN: 44 mg/dL — ABNORMAL HIGH (ref 6–23)
CO2: 26 mEq/L (ref 19–32)
Chloride: 101 mEq/L (ref 96–112)
GFR calc Af Amer: 22 mL/min — ABNORMAL LOW (ref >90)
Glucose, Bld: 107 mg/dL — ABNORMAL HIGH (ref 70–99)
Potassium: 4.1 mEq/L (ref 3.5–5.1)
Total Bilirubin: 0.4 mg/dL (ref 0.3–1.2)

## 2011-01-03 LAB — CBC
HCT: 27.5 % — ABNORMAL LOW (ref 39.0–52.0)
Hemoglobin: 9.1 g/dL — ABNORMAL LOW (ref 13.0–17.0)
MCH: 32.6 pg (ref 26.0–34.0)
MCHC: 33.1 g/dL (ref 30.0–36.0)
MCV: 98.6 fL (ref 78.0–100.0)

## 2011-01-04 ENCOUNTER — Telehealth: Payer: Self-pay

## 2011-01-04 LAB — CBC
HCT: 27.7 % — ABNORMAL LOW (ref 39.0–52.0)
Hemoglobin: 8.9 g/dL — ABNORMAL LOW (ref 13.0–17.0)
RDW: 13.5 % (ref 11.5–15.5)
WBC: 5.3 10*3/uL (ref 4.0–10.5)

## 2011-01-04 LAB — GLUCOSE, CAPILLARY
Glucose-Capillary: 120 mg/dL — ABNORMAL HIGH (ref 70–99)
Glucose-Capillary: 129 mg/dL — ABNORMAL HIGH (ref 70–99)

## 2011-01-04 LAB — BASIC METABOLIC PANEL
CO2: 27 mEq/L (ref 19–32)
GFR calc non Af Amer: 18 mL/min — ABNORMAL LOW (ref >90)
Glucose, Bld: 122 mg/dL — ABNORMAL HIGH (ref 70–99)
Potassium: 3.7 mEq/L (ref 3.5–5.1)
Sodium: 140 mEq/L (ref 135–145)

## 2011-01-04 LAB — PROTIME-INR
INR: 1.36 (ref 0.00–1.49)
Prothrombin Time: 17 seconds — ABNORMAL HIGH (ref 11.6–15.2)

## 2011-01-04 LAB — HEPARIN LEVEL (UNFRACTIONATED): Heparin Unfractionated: 0.34 IU/mL (ref 0.30–0.70)

## 2011-01-04 NOTE — Telephone Encounter (Signed)
None at this time.

## 2011-01-04 NOTE — Telephone Encounter (Signed)
Called left message to call back 

## 2011-01-04 NOTE — Telephone Encounter (Signed)
Informed the patients daughter of information. 

## 2011-01-04 NOTE — H&P (Signed)
NAME:  Nathan Blackwell, BABA NO.:  [id number]  MEDICAL RECORD NO.:  [id number]  LOCATION:  [facility]                         FACILITY:  [facility]  PHYSICIAN:  Talmage Nap, MD  DATE OF BIRTH:  [birth year]  DATE OF ADMISSION:  01/01/2011 DATE OF DISCHARGE:                             HISTORY & PHYSICAL   PRIMARY CARE PHYSICIAN:  Corwin Levins, MD  History obtainable from the patient.  CHIEF COMPLAINT:  Swelling of the right leg progressing proximally to involve the right thigh of 1 day's duration.  The patient is an 77 year old Caucasian male, hearing-impaired with history of colon CA status post resection as well as vocal cord CA status post surgery plus radiation, was said to have fallen on his left side about 2 weeks ago.  However, over the night when the patient woke up, he noticed swelling in his right lower extremity and this was progressing proximally to involve the right knee as well as the right thigh.  The swelling was associated with difficulty in ambulation and also shortness of breath.  The patient denied any fever.  He denied any chills.  He denied any rigor but he said he was getting progressively incapacitated and short of breath and subsequently came to the emergency room to be evaluated.  PAST MEDICAL HISTORY:  Positive for: 1. Hypertension. 2. Diabetes mellitus. 3. Colon CA. 4. Vocal cord CA. 5. Depression. 6. GERD. 7. Hyperlipidemia. 8. Hearing impaired. 9. Hypothyroidism. 10.?Chronic pancreatitis on Creon. 11.History of kidney stones. 12.Osteoporosis. 13.Sleep apnea. 14.BPH.  PAST SURGICAL HISTORY: 1. Vocal cord CA status post surgery, questionable laryngectomy plus     radiation. 2. Colon CA status post exploratory laparotomy with resection. 3. Appendectomy. 4. Cholecystectomy.  Preadmission meds without dosages include: 1. Azor. 2. Bupropion. 3. Citalopram. 4. Creon. 5. Fenofibrate micronized. 6. Furosemide. 7.  Hydralazine. 8. Iron. 9. Jantoven. 10.Januvia. 11.Lansoprazole. 12.Levothyroxine. 13.Olanzapine. 14.Tamsulosin.  ALLERGIES:  SULFA (BIAXIN).  SOCIAL HISTORY:  Negative for alcohol, tobacco use.  The patient lives at home with his spouse and he is retired.  FAMILY HISTORY:  Mother died of old age and father died of complication of CVA.  REVIEW OF SYSTEMS:  The patient denies any history of headaches.  No blurry vision.  No nausea or vomiting.  No fever.  No chills.  No rigor. Complained of occasional shortness of breath.  No associated chest pain. Denies any cough.  No abdominal discomfort.  No diarrhea or hematochezia.  No dysuria or hematuria.  Has swelling of the right leg that is said to be progressing proximally to involve the right knee and the right thigh with associated shortness of breath.  No intolerance to heat or cold and no neuropsychiatric disorder.  PHYSICAL EXAMINATION:  GENERAL:  Very pleasant, elderly man, hearing impaired, not in any obvious respiratory distress. PRESENT VITAL SIGNS:  Blood pressure is 182/68, pulse 66, respiratory rate 28, temperature is 98.6. HEENT:  Pupils are reactive to light and extraocular muscles are intact. NECK:  He has no jugular venous distention.  No carotid bruit.  No lymphadenopathy. CHEST:  Clear to auscultation. HEART:  Heart sounds are 1 and 2. ABDOMEN:  Soft, nontender.  Liver,  spleen, kidney not palpable.  Bowel sounds are positive. EXTREMITIES:  Right pedal edema extending proximally to involve the right knee as well as distal portion of the right thigh.Slightly warm to touch.  Right leg bigger than the left leg. NEUROPSYCHIATRIC:  Unremarkable. SKIN:  Small petechiae in the right lower extremity.  LABORATORY DATA:  Chemistry showed sodium of 138, potassium of 4.6, chloride of 99 with a bicarb of 25, glucose is 179, BUN is 42, creatinine is 2.87, calcium is 10.0.  Coagulation profile showed PT 13.8, INR 1.04, and  APTT of 26.  Hematological indices showed WBC of 10.2, hemoglobin 11.4, hematocrit of 35.0, MCV of 99.4 with a platelet count of 277, neutrophils 84%, and absolute neutrophil count is 8.5. First set of cardiac markers, troponin I 0.01.  Imaging studies done include V/Q scan which showed low probability of a pulmonary embolism.  EKG showed normal sinus rhythm with left axis deviation, nonspecific T-wave changes with a rate of 78 beats per minute.  Duplex ultrasound of the lower extremities showed extensive occlusive DVT noted throughout the right lower extremity beginning in the posterior tibia coursing through to the popliteal, femoral, and a common femoral vein.  There is no occlusive propagation of the DVT to the left common femoral vein.  There is also chronic clot noted in the left posterior tibial and femoral vein.  IMPRESSION: 1. Extensive deep venous thrombosis, right leg. 2. Renal insufficiency. 3. History of colon cancer status post resection. 4. History of vocal cord cancer status post surgery, questionable     laryngectomy plus radiation. 5. Diabetes mellitus. 6. Gastroesophageal reflux disease. 7. Hypertension. 8. Hyperlipidemia. 9. Questionable chronic pancreatitis on Creon. 10.Hypothyroidism. 11.Depression. 12.Benign prostatic hypertrophy. 13.Hearing impaired.  PLAN:  Admit the patient to telemetry.  The patient will continue on heparin drip per protocol as initially initiated by the ED physician therapeutically and will start Coumadin p.o. per pharmacy on January 02, 2011.  Other medication to be given to the patient will include Lasix 40 mg IV q.24 h.  He will also be on albumin 25% 100 mL IV q.12 x3 days. Blood pressure will be maintained with Azor 10/20 one p.o. daily and hydralazine 25 mg p.o. b.i.d.  For hypothyroidism, the patient will be on Synthroid 50 mcg p.o. daily.  Since the patient is diabetic, he will be on Accu-Chek t.i.d. with before meals and  nightly regular insulin sliding scale (moderate scale) and with history of depression, he will be on olanzapine 5 mg p.o. daily and citalopram 10 mg p.o. daily.  For BPH, he will be on Flomax 0.4 mg p.o. at bedtime and GI prophylaxis will be done with Protonix 40 mg IV q.24.  Further workup to be done on this patient will include cardiac enzymes q.6 x3, hemoglobin A1c.  PT, PTT, INR will be repeated in a.m.  CBC, CMP, and magnesium will also be done in a.m.  The patient will be followed and evaluated on day-to-day basis.     Talmage Nap, MD     CN/MEDQ  D:  01/01/2011  T:  01/01/2011  Job:  [id number]  Electronically Signed by Talmage Nap  on 01/04/2011 06:44:16 AM

## 2011-01-04 NOTE — Telephone Encounter (Signed)
Alona Bene the patients daughter called to inform the patient is in the hospital due to blood clots. He is not responding to blood thinners. The family is requesting as his PCP do you have any advise to give them or MD's at the hospital to request. Please advise

## 2011-01-05 LAB — GLUCOSE, CAPILLARY
Glucose-Capillary: 118 mg/dL — ABNORMAL HIGH (ref 70–99)
Glucose-Capillary: 137 mg/dL — ABNORMAL HIGH (ref 70–99)

## 2011-01-05 LAB — PROTIME-INR
INR: 1.73 — ABNORMAL HIGH (ref 0.00–1.49)
Prothrombin Time: 20.6 seconds — ABNORMAL HIGH (ref 11.6–15.2)

## 2011-01-05 LAB — HEPARIN LEVEL (UNFRACTIONATED): Heparin Unfractionated: 0.73 IU/mL — ABNORMAL HIGH (ref 0.30–0.70)

## 2011-01-05 LAB — CBC
Platelets: 199 10*3/uL (ref 150–400)
RBC: 2.66 MIL/uL — ABNORMAL LOW (ref 4.22–5.81)
RDW: 13.7 % (ref 11.5–15.5)
WBC: 4.1 10*3/uL (ref 4.0–10.5)

## 2011-01-06 ENCOUNTER — Telehealth: Payer: Self-pay | Admitting: *Deleted

## 2011-01-06 ENCOUNTER — Inpatient Hospital Stay (HOSPITAL_COMMUNITY): Payer: Medicare Other

## 2011-01-06 DIAGNOSIS — I82409 Acute embolism and thrombosis of unspecified deep veins of unspecified lower extremity: Secondary | ICD-10-CM

## 2011-01-06 LAB — URINALYSIS, ROUTINE W REFLEX MICROSCOPIC
Bilirubin Urine: NEGATIVE
Ketones, ur: NEGATIVE mg/dL
Leukocytes, UA: NEGATIVE
Nitrite: NEGATIVE
Specific Gravity, Urine: 1.012 (ref 1.005–1.030)
Urobilinogen, UA: 0.2 mg/dL (ref 0.0–1.0)

## 2011-01-06 LAB — CBC
HCT: 28.6 % — ABNORMAL LOW (ref 39.0–52.0)
Hemoglobin: 9.2 g/dL — ABNORMAL LOW (ref 13.0–17.0)
MCH: 31.9 pg (ref 26.0–34.0)
MCHC: 32.2 g/dL (ref 30.0–36.0)
MCV: 99.3 fL (ref 78.0–100.0)
RBC: 2.88 MIL/uL — ABNORMAL LOW (ref 4.22–5.81)

## 2011-01-06 LAB — BASIC METABOLIC PANEL
Chloride: 104 mEq/L (ref 96–112)
GFR calc Af Amer: 22 mL/min — ABNORMAL LOW (ref >90)
GFR calc non Af Amer: 19 mL/min — ABNORMAL LOW (ref >90)
Glucose, Bld: 105 mg/dL — ABNORMAL HIGH (ref 70–99)
Potassium: 3.6 mEq/L (ref 3.5–5.1)
Sodium: 142 mEq/L (ref 135–145)

## 2011-01-06 LAB — HEPARIN LEVEL (UNFRACTIONATED): Heparin Unfractionated: 0.59 IU/mL (ref 0.30–0.70)

## 2011-01-06 LAB — GLUCOSE, CAPILLARY: Glucose-Capillary: 123 mg/dL — ABNORMAL HIGH (ref 70–99)

## 2011-01-06 LAB — CREATININE, URINE, RANDOM: Creatinine, Urine: 77.59 mg/dL

## 2011-01-06 NOTE — Telephone Encounter (Signed)
Pt is being discharged today as a new Coumadin pt and they need an order to check PT/INR on Monday. [facility] has 90+ pts on Monday and they want to know if order can be placed here at lab since [facility] will be unable to see him- JWJ pt, please .

## 2011-01-06 NOTE — Telephone Encounter (Signed)
Stanton Kidney from [facility] informed of order.

## 2011-01-06 NOTE — Telephone Encounter (Signed)
i found dx of dvt.  i ordered pt/inr

## 2011-01-06 NOTE — Telephone Encounter (Signed)
i need a dx for this 

## 2011-01-07 LAB — BASIC METABOLIC PANEL
BUN: 48 mg/dL — ABNORMAL HIGH (ref 6–23)
Calcium: 9.4 mg/dL (ref 8.4–10.5)
Creatinine, Ser: 2.29 mg/dL — ABNORMAL HIGH (ref 0.50–1.35)
GFR calc Af Amer: 28 mL/min — ABNORMAL LOW (ref >90)
GFR calc non Af Amer: 24 mL/min — ABNORMAL LOW (ref >90)
Potassium: 3.9 mEq/L (ref 3.5–5.1)

## 2011-01-07 LAB — GLUCOSE, CAPILLARY
Glucose-Capillary: 127 mg/dL — ABNORMAL HIGH (ref 70–99)
Glucose-Capillary: 132 mg/dL — ABNORMAL HIGH (ref 70–99)

## 2011-01-07 LAB — URINE CULTURE
Colony Count: NO GROWTH
Culture  Setup Time: 201211021545

## 2011-01-07 LAB — CBC
Hemoglobin: 9.4 g/dL — ABNORMAL LOW (ref 13.0–17.0)
MCHC: 32.9 g/dL (ref 30.0–36.0)
Platelets: 229 10*3/uL (ref 150–400)
RDW: 13.8 % (ref 11.5–15.5)

## 2011-01-07 LAB — HEPARIN LEVEL (UNFRACTIONATED): Heparin Unfractionated: 0.54 IU/mL (ref 0.30–0.70)

## 2011-01-07 LAB — PROTIME-INR: Prothrombin Time: 28.9 seconds — ABNORMAL HIGH (ref 11.6–15.2)

## 2011-01-07 MED ORDER — ONDANSETRON HCL 4 MG/2ML IJ SOLN: 4.0000 mg | Freq: Four times a day (QID) | INTRAMUSCULAR | Status: DC | PRN

## 2011-01-07 MED ORDER — BUPROPION HCL ER (SR) 150 MG PO TB12
150.0000 mg | ORAL_TABLET | Freq: Every day | ORAL | Status: DC
  Administered 2011-01-08: 150 mg via ORAL
  Filled 2011-01-07 (×2): qty 1

## 2011-01-07 MED ORDER — FUROSEMIDE 20 MG PO TABS
20.0000 mg | ORAL_TABLET | Freq: Every day | ORAL | Status: DC
  Administered 2011-01-08: 20 mg via ORAL
  Filled 2011-01-07 (×2): qty 1

## 2011-01-07 MED ORDER — SODIUM CHLORIDE 0.9 % IJ SOLN
3.0000 mL | Freq: Two times a day (BID) | INTRAMUSCULAR | Status: DC
  Administered 2011-01-08: 3 mL via INTRAVENOUS

## 2011-01-07 MED ORDER — LINAGLIPTIN 5 MG PO TABS
5.0000 mg | ORAL_TABLET | Freq: Every day | ORAL | Status: DC
  Administered 2011-01-08: 5 mg via ORAL
  Filled 2011-01-07 (×2): qty 1

## 2011-01-07 MED ORDER — INSULIN ASPART 100 UNIT/ML ~~LOC~~ SOLN
0.0000 [IU] | Freq: Three times a day (TID) | SUBCUTANEOUS | Status: DC
  Filled 2011-01-07: qty 3

## 2011-01-07 MED ORDER — PANTOPRAZOLE SODIUM 40 MG PO TBEC
40.0000 mg | DELAYED_RELEASE_TABLET | Freq: Every day | ORAL | Status: DC
  Administered 2011-01-08: 40 mg via ORAL

## 2011-01-07 MED ORDER — ALBUTEROL SULFATE HFA 108 (90 BASE) MCG/ACT IN AERS: 2.0000 | INHALATION_SPRAY | Freq: Four times a day (QID) | RESPIRATORY_TRACT | Status: DC | PRN

## 2011-01-07 MED ORDER — OLANZAPINE 5 MG PO TABS
5.0000 mg | ORAL_TABLET | Freq: Every day | ORAL | Status: DC
  Administered 2011-01-08: 5 mg via ORAL
  Filled 2011-01-07 (×2): qty 1

## 2011-01-07 MED ORDER — CITALOPRAM HYDROBROMIDE 20 MG PO TABS
20.0000 mg | ORAL_TABLET | Freq: Every day | ORAL | Status: DC
  Administered 2011-01-08: 20 mg via ORAL
  Filled 2011-01-07 (×2): qty 1

## 2011-01-07 MED ORDER — LEVOTHYROXINE SODIUM 100 MCG PO TABS
100.0000 ug | ORAL_TABLET | Freq: Every day | ORAL | Status: DC
  Administered 2011-01-08: 100 ug via ORAL
  Filled 2011-01-07 (×2): qty 1

## 2011-01-07 MED ORDER — ASPIRIN EC 81 MG PO TBEC
81.0000 mg | DELAYED_RELEASE_TABLET | Freq: Every day | ORAL | Status: DC
  Filled 2011-01-07 (×2): qty 1

## 2011-01-07 MED ORDER — PANCRELIPASE (LIP-PROT-AMYL) 12000-38000 UNITS PO CPEP
1.0000 | ORAL_CAPSULE | Freq: Three times a day (TID) | ORAL | Status: DC
  Filled 2011-01-07 (×7): qty 1

## 2011-01-07 MED ORDER — DOCUSATE SODIUM 100 MG PO CAPS: 100.0000 mg | ORAL_CAPSULE | Freq: Every day | ORAL | Status: DC

## 2011-01-07 MED ORDER — HYDROMORPHONE HCL PF 1 MG/ML IJ SOLN: 0.5000 mg | INTRAMUSCULAR | Status: DC | PRN

## 2011-01-07 MED ORDER — TAMSULOSIN HCL 0.4 MG PO CAPS
0.4000 mg | ORAL_CAPSULE | Freq: Every day | ORAL | Status: DC
  Filled 2011-01-07 (×2): qty 1

## 2011-01-07 MED ORDER — OLMESARTAN MEDOXOMIL 40 MG PO TABS
40.0000 mg | ORAL_TABLET | Freq: Every day | ORAL | Status: DC
  Administered 2011-01-08: 40 mg via ORAL
  Filled 2011-01-07 (×2): qty 1

## 2011-01-07 MED ORDER — SODIUM CHLORIDE 0.9 % IV SOLN: INTRAVENOUS | Status: DC

## 2011-01-07 MED ORDER — AMLODIPINE BESYLATE 5 MG PO TABS
5.0000 mg | ORAL_TABLET | Freq: Every day | ORAL | Status: DC
  Administered 2011-01-08: 5 mg via ORAL
  Filled 2011-01-07 (×2): qty 1

## 2011-01-07 MED ORDER — FENOFIBRATE 160 MG PO TABS
160.0000 mg | ORAL_TABLET | Freq: Every day | ORAL | Status: DC
  Administered 2011-01-08: 160 mg via ORAL
  Filled 2011-01-07 (×2): qty 1

## 2011-01-07 MED ORDER — POLYETHYLENE GLYCOL 3350 17 G PO PACK
17.0000 g | PACK | Freq: Every day | ORAL | Status: DC
  Filled 2011-01-07 (×2): qty 1

## 2011-01-07 MED ORDER — HYDRALAZINE HCL 25 MG PO TABS
25.0000 mg | ORAL_TABLET | Freq: Three times a day (TID) | ORAL | Status: DC
  Administered 2011-01-08: 25 mg via ORAL
  Filled 2011-01-07 (×5): qty 1

## 2011-01-07 MED ORDER — HEPARIN (PORCINE) IN NACL 100-0.45 UNIT/ML-% IJ SOLN
1500.0000 [IU]/h | INTRAMUSCULAR | Status: DC
  Filled 2011-01-07 (×2): qty 250

## 2011-01-07 NOTE — Progress Notes (Signed)
NAME:  FARD, BORUNDA NO.:  [id number]  MEDICAL RECORD NO.:  [id number]  LOCATION:  5005                         FACILITY:  [facility]  PHYSICIAN:  Altha Harm, MDDATE OF BIRTH:  10-27-1920                                PROGRESS NOTE   DISCHARGE DISPOSITION: Home with Home Health Services.  PT and RN Services.  INTERVAL HISTORY: The patient had some nausea overnight.  He states that due to his chronic pancreatitis he has intermittent nausea.  However, he got some Zofran and since then has had no further nausea.  The patient is insistent that his discharge disposition will be home.  He does not want short-term rehab at a skilled facility.  Otherwise, the patient has no complaints.  He was up with physical therapy today ambulating which by his estimation he felt that he did well with that.  He denies any headache.  He denies any abdominal pain.  PHYSICAL EXAMINATION: VITAL SIGNS:  Today temperature is 98, heart rate 56, blood pressure 137/63, respiratory rate 18, O2 sats are 98% on room air. GENERAL:  The patient is well appearing, in no acute distress. HEENT:  He is normocephalic, atraumatic.  Pupils equally round and reactive to light and accommodation.  Extraocular movements are intact. Oropharynx is moist.  No exudate, erythema, or lesions are noted. NECK:  Trachea is midline.  No masses.  No thyromegaly.  No JVD.  No carotid bruits. RESPIRATORY:  The patient has a normal respiratory effort.  Equal excursion bilaterally.  No wheezing or rhonchi noted. CARDIOVASCULAR:  He has got a normal S1, S2.  No murmurs, rubs, or gallops noted.  PMI is nondisplaced.  No heaves or thrills on palpation. ABDOMEN:  Obese, soft, nontender, nondistended.  No masses.  No hepatosplenomegaly is noted. EXTREMITIES:  The patient has swelling in the right lower extremity which is now better than it had been for the last 48 hours.  He has good pedal and popliteal pulses in  bilateral lower extremities. PSYCHIATRIC:  He is alert and oriented x3.  Good insight and cognition. Good recent and remote recall. NEUROLOGIC:  He has no focal neurological deficits.  Cranial nerves II through XII are grossly intact.  PERTINENT LABORATORY STUDIES: Sodium is 140, potassium is 3.7, chloride 103, bicarb 25, BUN 48, creatinine 2.29 which is down from 2.73 on yesterday.  Please note that creatinine on admission was 2.61 with a BUN of 40.  White blood cell count of 6.5, hemoglobin 9.4, hematocrit 28.6, platelet count 229, and his INR today is therapeutic at 2.67.  This is his second day of being therapeutic on Coumadin with heparin overlap.  ASSESSMENT AND PLAN: 1. Massive acute deep venous thrombosis on chronic deep venous     thrombosis.  The patient was considered for thrombolysis by     interventional Radiology, however, was felt to be an inappropriate     candidate due to his renal insufficiency.  His hypercoagulable     state has been treated with Coumadin and heparin and the Coumadin     has been titrated to therapeutic level.  The patient has had now 2     full  days of overlap of therapy and can go home tomorrow on a     regimen of 2.5 mg on Tuesday, Thursday, Saturday, and Sunday and 5     mg on Monday, Wednesday, and Friday. 2. Chronic kidney disease stage III.  The patient has a baseline     creatinine of about 2.5.  Right now, he is down to 2.29.  When I     reviewed the patient's records, it appears that he has had     progressive worsening of his creatinine over time but his     creatinine in August 2012 was being 2.5 and a BUN of 40.  Thus, the     patient is almost back at baseline of his BUN and creatinine.  He     had a renal ultrasound which showed only chronic medical renal     disease, but no evidence of hydronephrosis.  A urinalysis was     negative for any elements consistent with a urinary tract     infection. 3. Diabetes type 2.  Blood sugars  are well controlled and he will     continue on his usual medications. 4. Hypertension.  Again, blood pressure is well controlled, although     not at goal for his diabetes. 5. Chronic pancreatitis.  The patient is continued on his Creon. 6. Benign prostatic hypertrophy.  He is continued on his tamsulosin. 7. Obstructive sleep apnea.  He is to continue with his CPAP. 8. Hypothyroidism.  The patient is euthyroid and is continued on his     usual dose of Synthroid.  ANTICIPATED DISCHARGE MEDICATIONS: 1. Azor 5/40 mg p.o. daily. 2. Aspirin 81 mg p.o. daily. 3. Creon 12,000 units 1 capsule p.o. t.i.d. 4. Celexa 20 mg p.o. daily. 5. Docusate 100 mg p.o. at bedtime. 6. Fenofibrate 160 mg p.o. daily. 7. Lasix 40 mg p.o. daily. 8. Hydralazine 25 mg p.o. t.i.d. 9. Januvia 50 mg p.o. daily. 10.Lansoprazole 30 mg p.o. daily. 11.Synthroid 100 mcg p.o. daily. 12.Olanzapine 5 mg p.o. daily. 13.Tamsulosin 0.4 mg p.o. at bedtime. 14.Ventolin inhaler 2 puffs q.8 hours as needed. 15.Wellbutrin SR 150 mg p.o. daily.     Altha Harm, MD     MAM/MEDQ  D:  01/07/2011  T:  01/07/2011  Job:  [id number]  Electronically Signed by Marthann Schiller MD on 01/07/2011 06:19:54 PM

## 2011-01-07 NOTE — Plan of Care (Signed)
Problem: Consults Goal: Diagnosis - Venous Thromboembolism (VTE) Choose a selection Outcome: Completed in [facility] Date Met:  01/07/11 DVT (Deep Vein Thrombosis)

## 2011-01-08 DIAGNOSIS — N183 Chronic kidney disease, stage 3 unspecified: Secondary | ICD-10-CM | POA: Diagnosis not present

## 2011-01-08 DIAGNOSIS — Z7901 Long term (current) use of anticoagulants: Secondary | ICD-10-CM

## 2011-01-08 LAB — GLUCOSE, CAPILLARY: Glucose-Capillary: 101 mg/dL — ABNORMAL HIGH (ref 70–99)

## 2011-01-08 LAB — BASIC METABOLIC PANEL
BUN: 47 mg/dL — ABNORMAL HIGH (ref 6–23)
Chloride: 105 mEq/L (ref 96–112)
GFR calc Af Amer: 27 mL/min — ABNORMAL LOW (ref >90)
Glucose, Bld: 82 mg/dL (ref 70–99)
Potassium: 4.5 mEq/L (ref 3.5–5.1)
Sodium: 142 mEq/L (ref 135–145)

## 2011-01-08 LAB — HEPARIN LEVEL (UNFRACTIONATED): Heparin Unfractionated: <0.1 IU/mL — ABNORMAL LOW (ref 0.30–0.70)

## 2011-01-08 LAB — CBC
HCT: 31.7 % — ABNORMAL LOW (ref 39.0–52.0)
MCH: 32.4 pg (ref 26.0–34.0)
MCV: 99.7 fL (ref 78.0–100.0)
Platelets: 248 10*3/uL (ref 150–400)
RBC: 3.18 MIL/uL — ABNORMAL LOW (ref 4.22–5.81)

## 2011-01-08 LAB — PROTIME-INR: INR: 2.66 — ABNORMAL HIGH (ref 0.00–1.49)

## 2011-01-08 MED ORDER — WARFARIN SODIUM 2.5 MG PO TABS
2.5000 mg | ORAL_TABLET | Freq: Once | ORAL | Status: DC
  Filled 2011-01-08: qty 1

## 2011-01-08 MED ORDER — FUROSEMIDE 20 MG PO TABS: 20.0000 mg | ORAL_TABLET | Freq: Every day | ORAL | Status: DC

## 2011-01-08 MED ORDER — WARFARIN SODIUM 5 MG PO TABS: ORAL_TABLET | ORAL | Status: DC

## 2011-01-08 NOTE — Progress Notes (Signed)
ANTICOAGULATION CONSULT NOTE - Follow Up Consult  Pharmacy Consult for Coumadin Indication: DVT  Allergies  Allergen Reactions  . Biaxin (Clarithromycin)   . Sulfa Antibiotics     Patient Measurements: Height: 5' 9.5" (176.5 cm) (Entered for [employer]) Weight: 173 lb 4.5 oz (78.6 kg) (Entered for [employer]) IBW/kg (Calculated) : 71.85  Adjusted Body Weight:   Vital Signs: Temp: 98 F (36.7 C) (11/04 0500) Temp src: Oral (11/04 0500) BP: 132/56 mmHg (11/04 0500) Pulse Rate: 52  (11/04 0500)  Labs:  Basename 01/08/11 0600 01/07/11 0630 01/06/11 0610  HGB -- 9.4* 9.2*  HCT -- 28.6* 28.6*  PLT -- 229 238  APTT -- -- --  LABPROT 28.8* 28.9* 23.7*  INR 2.66* 2.67* 2.07*  HEPARINUNFRC <0.10* 0.54 0.59  CREATININE 2.36* 2.29* 2.73*  CKTOTAL -- -- --  CKMB -- -- --  TROPONINI -- -- --   Estimated Creatinine Clearance: 22 ml/min (by C-G formula based on Cr of 2.36).  Assessment: This is an 77 yo M on chronic coumadin for extensive RLE DVT and hx of DVT. Heparin d/c yesterday after two days of INR therapeutic overlap. Noted pt to be d/cd today. INR today 2.66 (goal 2-3).  No current CBC today but pt appears to be stable with  no bleeding noted.   Goal of Therapy:  INR 2-3   Plan:  1. If patient not d/c today, give coumadin 2.5 mg. I agree with MD dosing of 2.5mg  TTSS and 5mg  MWF.  2. Noted that coumadin was not listed as one of the medications to give upon d/c in note written by MD in regards to d/c med list.  Thank you!    Brett Fairy N 01/08/2011,8:14 AM

## 2011-01-08 NOTE — Consult Note (Signed)
NAME:  Nathan Blackwell, WINSKI NO.:  [id number]  MEDICAL RECORD NO.:  [id number]  LOCATION:  5005                         FACILITY:  [facility]  PHYSICIAN:  Maree Krabbe, M.D.DATE OF BIRTH:  [birth year]  DATE OF CONSULTATION:  01/07/2011 DATE OF DISCHARGE:                                CONSULTATION   REQUESTING PHYSICIAN:  Altha Harm, MD  REASON FOR CONSULT:  Chronic kidney disease, not previously evaluated.  HISTORY:  This 77 year old white male was admitted with right leg pain after recent fall.  He was found have an acute DVT and has been in the hospital for several days, getting IV heparin and Coumadin.  INR is now therapeutic.  The patient's creatinine has been in the mid 2s during the entire admission and the Renal Service was asked to see the patient for CKD evaluation.  The patient says he has never seen a nephrologist before or been treated for any type of kidney disease.  He has a 12-year history of diabetes.  No history of any laser treatment of his eyes.  He has had cataract surgery and eyelid surgery.  He takes oral agents and has taken insulin.  He has 8-year history of hypertension, takes 3 blood pressure medications and diuretics.  He has BPH, mild memory loss, chronic pancreatitis, on Creon and history of colon cancer in 1998, treated with surgery and chemotherapy.  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  In 2008, the patient's creatinine was 1.2.  In 2010, it was 1.4-1.6.  In 2011, the creatinine was 1.5-2.9 and in May 2012, it was up to 2.2.  In August 2012, creatinine was 2.5 and on this admission, creatinine was 2.8 on admission and is down to 2.3 today.  Urinalysis is entirely negative with no protein, red cells or white cells.  The renal ultrasound on this admission shows 10.8 and 12.6-cm kidneys with increased echogenicity and bilateral cysts.  The patient denies  any heavy or long-term use of over-the-counter NSAIDs or analgesics.  He is married and lives with his wife in [place], has 2 grown children, and is retired.  Never smoked or drank.  PAST MEDICAL HISTORY: 1. Hypertension. 2. Diabetes mellitus type 2. 3. Colon cancer. 4. Focal cord cancer. 5. GERD. 6. Hyperlipidemia. 7. Hypothyroid. 8. Chronic pancreatitis. 9. History of remote kidney stone. 10.Osteoporosis. 11.Sleep apnea. 12.BPH.  PAST SURGICAL HISTORY:  Surgical treatment of colon and vocal cord cancer as above, appendectomy and cholecystectomy.  HOME MEDICATIONS:  AZOR, furosemide, hydralazine, bupropion, citalopram, and olanzapine.  Tamsulosin, levothyroxine, lansoprazole, Januvia, Gentiva, iron, fenofibrate, and Creon.  ALLERGIES:  SULFA (BIAXIN).  SOCIAL HISTORY:  As above.  FAMILY HISTORY:  Mother died of old age and father died of stroke complications.  REVIEW OF SYSTEMS:  Denies any headache, visual change, sore throat, difficulty swallowing, shortness of breath or chest pain, abdominal pain, nausea, vomiting, diarrhea, change in urinary habits, difficulty voiding or dysuria, joint pain or swelling, skin rash or itching.  PHYSICAL EXAMINATION:  VITAL SIGNS:  Blood pressure is stable during this admission, 120-160 range systolic, temperature 98.4, pulse is 60, respirations  20, O2 saturation 98% on room air. GENERAL:  This is an elderly pleasant male, was fully oriented, in no DISTRESS. SKIN:  Warm and dry without rash, cyanosis, or edema. HEENT:  PERRLA, EOMI.  Throat is clear.  Moist. NECK:  Supple with flat neck veins, no JVD or bruits. CHEST:  Clear throughout.  No rales, rhonchi, or wheezing. CARDIAC:  Regular rate and rhythm without murmur, rub, or gallop.  PMI is nondisplaced.  Precordium is quiet. ABDOMEN:  Soft, nontender, nondistended.  He has a scar on the right upper quadrant from previous cholecystectomy, no ascites, no masses. GU:  Normal  male genitalia. RECTAL:  Deferred. EXTREMITIES:  Extremities show 2+ pitting edema throughout the right leg from the thigh to the foot.  There is some splotchy erythematous eruption on the right lower leg also.  There is no left leg edema. There is no upper extremity edema.  Pulses in the feet are 2+ bilaterally. NEUROLOGIC:  Awake, alert, and oriented x3.  No asterixis.  LABORATORY DATA:  Sodium 140, potassium 3.9, BUN 48, CO2 25, creatinine 2.29.  Estimated GFR 24 mL/minute.  Calcium 9.4.  White blood count 6000, hemoglobin 9.4, platelets 229, magnesium 2.3.  UA negative.  IMPRESSION: 1. Chronic kidney disease, stage IV possibly due to hypertensive     nephrosclerosis and/or renovascular disease, could be some type of     chronic interstitial disease as well, multiple myeloma should be     considered also.  There is no proteinuria.  He is diabetic, but     unlikely to have diabetic nephropathy without proteinuria.  I will     check PTH and phosphorus levels, evaluate for secondary     hyperparathyroidism. 2. Anemia. 3. Right lower extremity deep venous thrombosis, per primary service. 4. Diabetes mellitus type 2, on oral agents. 5. Hypertension, stable on medication. 6. History of benign prostatic hypertrophy. 7. History of chronic pancreatitis. 8. History of colon cancer. 9. History of vocal cord cancer. 10.Mild dementia.  RECOMMENDATIONS:  Check SPEP, UPEP, serum phosphorus, intact PTH, and iron studies, 25-hydroxyvitamin D level.  Okay for discharge from renal standpoint.  We can follow him up in the office and follow these lab results as well.     Maree Krabbe, M.D.     RDS/MEDQ  D:  01/07/2011  T:  01/08/2011  Job:  [id number]

## 2011-01-08 NOTE — Discharge Summary (Signed)
Nathan Blackwell MRN: [id number] DOB/AGE: Nov 19, 1920 77 y.o.  Admit date: 01/01/2011 Discharge date: 01/08/2011  Primary Care Physician:  Oliver Barre, MD, MD   Discharge Diagnoses:   Patient Active Problem List  Diagnoses  . NEOP, MALIGNANT, GLOTTIS  . THYROID NODULE  . HYPOTHYROIDISM  . DIABETES MELLITUS, TYPE II  . Other B-complex deficiencies  . HYPERLIPIDEMIA  . ANEMIA-NOS  . DEMENTIA  . DEPRESSION  . SLEEP APNEA, OBSTRUCTIVE  . TINNITUS  . HYPERTENSION  . BRADYCARDIA  . GERD  . CONSTIPATION, RECURRENT  . HEMATOCHEZIA  . BENIGN PROSTATIC HYPERTROPHY  . ARTHRITIS  . HIP PAIN, RIGHT  . BACK PAIN, THORACIC REGION  . ROTATOR CUFF SYNDROME, LEFT  . OLECRANON BURSITIS, RIGHT  . BURSITIS, RIGHT KNEE  . OSTEOPOROSIS  . DIZZINESS  . FATIGUE  . PARESTHESIA  . PERIPHERAL EDEMA  . Loss of weight  . PSA, INCREASED  . COLON CANCER, HX OF  . COLONIC POLYPS, HX OF  . NEPHROLITHIASIS, HX OF  . RENAL INSUFFICIENCY  . B12 deficiency  . Nausea & vomiting  . Deep venous thrombosis  . Anticoagulated on warfarin  . Anticoagulation monitoring, INR range 2-3  . CKD (chronic kidney disease), stage III  . Acute on chronic kidney disease, stage 3    DISCHARGE MEDICATION: Current Discharge Medication List    CONTINUE these medications which have NOT CHANGED   Details  amLODipine-olmesartan (AZOR) 5-40 MG per tablet Take 1 tablet by mouth every morning.     aspirin EC 81 MG tablet Take 81 mg by mouth every evening.      buPROPion (WELLBUTRIN SR) 150 MG 12 hr tablet Take 150 mg by mouth daily.      citalopram (CELEXA) 20 MG tablet TAKE 1 TABLET BY MOUTH ONCE A DAY Qty: 30 tablet, Refills: 11    docusate sodium (COLACE) 100 MG capsule Take 100 mg by mouth at bedtime.      fenofibrate 160 MG tablet Take 160 mg by mouth daily.     furosemide (LASIX) 40 MG tablet Take 40 mg by mouth every morning.      hydrALAZINE (APRESOLINE) 25 MG tablet Take 1 tablet (25 mg total) by mouth 3  (three) times daily. Qty: 90 tablet, Refills: 11   Associated Diagnoses: Unspecified essential hypertension    lansoprazole (PREVACID) 30 MG capsule TAKE 1 CAPSULE BY MOUTH EVERY DAY Qty: 30 capsule, Refills: 11    levothyroxine (SYNTHROID, LEVOTHROID) 100 MCG tablet Take 100 mcg by mouth every morning.     lipase/protease/amylase (CREON-10/PANCREASE) 12000 UNITS CPEP Take 1 capsule by mouth 3 (three) times daily before meals.      OLANZapine (ZYPREXA) 5 MG tablet TAKE 1 TABLET BY MOUTH ONCE A DAY Qty: 30 tablet, Refills: 9    sitaGLIPtin (JANUVIA) 50 MG tablet Take 50 mg by mouth daily.      Tamsulosin HCl (FLOMAX) 0.4 MG CAPS Take 0.4 mg by mouth at bedtime.            Consults: Treatment Team:  Maree Krabbe   SIGNIFICANT DIAGNOSTIC STUDIES:  US Renal  01/06/2011  *RADIOLOGY REPORT*  Clinical Data:  Chronic renal disease.  RENAL/URINARY TRACT ULTRASOUND COMPLETE  Comparison:  CT 09/23/2010  Findings:  Right Kidney:   12.6 cm.  Mild cortical thinning.  Mildly increased echotexture.  8 mm exophytic lower pole cyst.  No hydronephrosis.  Left Kidney:  10.8 cm.  Mildly increased echotexture and cortical thinning.  Multiple exophytic cysts off  the left kidney, the largest measuring up to 3.4 cm.  No hydronephrosis.  Bladder:  Mildly prominent prostate.  Bladder grossly unremarkable.  IMPRESSION: Increased echotexture of the kidneys bilaterally with cortical thinning.  Findings compatible with chronic medical renal disease.  Benign-appearing cysts.  No hydronephrosis.  Original Report Authenticated By: Cyndie Chime, M.D.   [facility]  01/01/2011  *RADIOLOGY REPORT*  Clinical Data: Deep venous thrombosis.  History of blood clots. Elevated creatinine.  Short of breath.  NM PULMONARY VENTILATION AND PERFUSION SCAN  Radiopharmaceutical: 10 mCi xenon 133.  6 mCi technetium 99 MAA.  Comparison: No chest comparison is available.  Findings: Ventilation images appear normal.   There is a small defect in the left lower lobe on the left posterior oblique view. Small defect is also present in the right middle lobe.  No moderate or large defects.  IMPRESSION: Low probability pulmonary embolus.  Original Report Authenticated By: Andreas Newport, M.D.       Recent Results (from the past 240 hour(s))  URINE CULTURE     Status: Normal   Collection Time   01/06/11  3:11 PM      Component Value Range Status Comment   Specimen Description URINE, CATHETERIZED   Final    Special Requests NONE   Final    Setup Time [id number]   Final    Colony Count NO GROWTH   Final    Culture NO GROWTH   Final    Report Status 01/07/2011 FINAL   Final    CHIEF COMPLAINT: Swelling of the right leg progressing proximally to  involve the right thigh of 1 day's duration.    BRIEF ADMITTING H & P:The patient is an 77 year old Caucasian male, hearing-impaired with  history of colon CA status post resection as well as vocal cord CA  status post surgery plus radiation, was said to have fallen on his left  side about 2 weeks ago. However, over the night when the patient woke  up, he noticed swelling in his right lower extremity and this was  progressing proximally to involve the right knee as well as the right  thigh. The swelling was associated with difficulty in ambulation and  also shortness of breath. The patient denied any fever. He denied any  chills. He denied any rigor but he said he was getting progressively  incapacitated and short of breath and subsequently came to the emergency  room to be evaluated.     Hospital Course:  Present on Admission:  .Deep venous thrombosis:Massive acute deep venous thrombosis on chronic deep venous  thrombosis. The patient was considered for thrombolysis by  interventional Radiology, however, was felt to be an inappropriate  candidate due to his renal insufficiency. His hypercoagulable  state has been treated with Coumadin and heparin and the  Coumadin  has been titrated to therapeutic level. The patient has had now 2  full days of overlap of therapy and can go home tomorrow on a  regimen of Coumadin 2.5 mg on Tuesday, Thursday, Saturday, and Sunday and 5  mg on Monday, Wednesday, and Friday.  .Acute kidney insufficiency: This is felt to be due to volume contraction. Lasix is discontinued transiently and the patient was given gentle hydration. He resolved his acute kidney insufficiency with his creatinine back to baseline of 2.36 at discharge with a BUN of 47.  Marland KitchenSLEEP APNEA, OBSTRUCTIVE: The patient continues uses CPAP while in the hospital had no problems.   Marland KitchenGERD: The  patient had protonic substitute in the hospital and had no further problems.   Marland KitchenPERIPHERAL EDEMA: The patient had some right-sided peripheral edema, which was felt to be due to the massive DVT. The swelling has decreased somewhat and recommendations the patient should wear TED hose to help in venous return of his extremities.  Marland KitchenHYPOTHYROIDISM: Patient was continued on his levothyroxine without any signs of symptoms of thyroid dysfunction.   Marland KitchenDIABETES MELLITUS, TYPE II: Patient is continued on his home medications. Blood sugars were well-controlled during this hospitalization however Tradjenta was substituted for Januvia during hospitalization.   Marland KitchenANEMIA-NOS: Hemoglobin stable during this hospitalization  .HYPERTENSION: Blood pressures were well-controlled during this hospitalization   .BENIGN PROSTATIC HYPERTROPHY: BPH was noted tamsulosin was continued without any event .  Marland KitchenDEMENTIA: This patient has a pre-existing diagnosis of dementia. However this does not appear to affect his executive functions his IADLs her ADLs.   .CKD (chronic kidney disease), stage III: Patient has chronic kidney disease stage III with baseline creatinine between 2.3 and 2.5.  Disposition and Follow-up: Patient to followup with Dr. Shary Key office on tomorrow 01/09/2011 to have his  Coumadin checked and further titrated as necessary by Dr. Melvyn Novas. He's to followup is also with Dr. Yetta Barre for a post hospital visit within one week. The patient is to call for the appointment  Discharge Orders    Future Appointments: Provider: Department: Dept Phone: Center:   02/21/2011 10:15 AM [facility] Nurse [facility] [phone] [facility]   09/20/2011 10:00 AM Barbaraann Share, MD [facility] [phone] None      DISCHARGE EXAM:   Blood pressure 132/56, pulse 52, temperature 98 F (36.7 C), temperature source Oral, resp. rate 18, height 5' 9.5" (1.765 m), weight 78.6 kg (173 lb 4.5 oz), SpO2 99.00%.  PHYSICAL EXAMINATION:  GENERAL: The patient is well appearing, in no acute distress.  HEENT: He is normocephalic, atraumatic. Pupils equally round and  reactive to light and accommodation. Extraocular movements are intact.  Oropharynx is moist. No exudate, erythema, or lesions are noted.  NECK: Trachea is midline. No masses. No thyromegaly. No JVD. No  carotid bruits.  RESPIRATORY: The patient has a normal respiratory effort. Equal  excursion bilaterally. No wheezing or rhonchi noted.  CARDIOVASCULAR: He has got a normal S1, S2. No murmurs, rubs, or  gallops noted. PMI is nondisplaced. No heaves or thrills on palpation.  ABDOMEN: Obese, soft, nontender, nondistended. No masses. No  hepatosplenomegaly is noted.  EXTREMITIES: The patient has swelling in the right lower extremity  which is now better than it had been for the last 48 hours. He has good  pedal and popliteal pulses in bilateral lower extremities.  PSYCHIATRIC: He is alert and oriented x3. Good insight and cognition.  Good recent and remote recall.  NEUROLOGIC: He has no focal neurological deficits. Cranial nerves II  through XII are grossly intact.   Basename 01/08/11 0600 01/07/11 0630  NA 142 140  K 4.5 3.9  CL 105 103  CO2 23 25  GLUCOSE 82 135*  BUN 47* 48*  CREATININE 2.36* 2.29*  CALCIUM 9.6 9.4  MG -- --    PHOS -- --   No results found for this basename: AST:2,ALT:2,ALKPHOS:2,BILITOT:2,PROT:2,ALBUMIN:2 in the last 72 hours No results found for this basename: LIPASE:2,AMYLASE:2 in the last 72 hours  Basename 01/08/11 0850 01/07/11 0630  WBC 5.7 6.5  NEUTROABS -- --  HGB 10.3* 9.4*  HCT 31.7* 28.6*  MCV 99.7 98.3  PLT 248 229  Signed: Lori-Ann Lindfors A. 01/08/2011, 11:37 AM

## 2011-01-09 ENCOUNTER — Encounter: Payer: Self-pay | Admitting: Endocrinology

## 2011-01-09 ENCOUNTER — Ambulatory Visit (INDEPENDENT_AMBULATORY_CARE_PROVIDER_SITE_OTHER): Payer: Medicare Other | Admitting: Endocrinology

## 2011-01-09 ENCOUNTER — Other Ambulatory Visit (INDEPENDENT_AMBULATORY_CARE_PROVIDER_SITE_OTHER): Payer: Medicare Other

## 2011-01-09 ENCOUNTER — Telehealth: Payer: Self-pay

## 2011-01-09 DIAGNOSIS — I82409 Acute embolism and thrombosis of unspecified deep veins of unspecified lower extremity: Secondary | ICD-10-CM

## 2011-01-09 DIAGNOSIS — Z7901 Long term (current) use of anticoagulants: Secondary | ICD-10-CM

## 2011-01-09 LAB — PROTIME-INR
INR: 3.6 ratio — ABNORMAL HIGH (ref 0.8–1.0)
Prothrombin Time: 39.8 s — ABNORMAL HIGH (ref 10.2–12.4)

## 2011-01-09 NOTE — Patient Instructions (Addendum)
You will receive a phone call today, with the results of your coumadin blood test, and any necessary changes in your coumadin dosage. it is critically important to prevent falling down (keep floor areas well-lit, dry, and free of loose objects.  If you have a cane, walker, or wheelchair, you should use it, even for short trips around the house.  Also, try not to rush.   Stop aspirin.   Please come back for a follow-up appointment in 3 days.

## 2011-01-09 NOTE — Telephone Encounter (Signed)
i believe your pills are 5 mg.  Please verify, and then take 1/2 qd

## 2011-01-09 NOTE — Progress Notes (Signed)
Subjective:    Patient ID: Nathan Blackwell, male    DOB: [birth year], 77 y.o.   MRN: [id number]  HPI Pt was d/c'ed from hospital after rx for right leg dvt.  He has few weeks of moderate swelling there, and assoc pain.  He says he was told not to take the asa. He doesn't know what the wellbutrin is for.    Past Medical History  Diagnosis Date  . Diabetes mellitus type II   . Hyperlipidemia   . Hypertension   . Osteoporosis   . Prostatic hypertrophy     benign  . OSA (obstructive sleep apnea)   . Rotator cuff syndrome     chronic  . Elevated PSA   . Hypothyroidism   . Nephrolithiasis   . Thyroid nodule   . GERD (gastroesophageal reflux disease)   . Colitis, Clostridium difficile     presumed 11/08  . Dementia   . Anemia     NOS  . Vitamin B 12 deficiency   . BRADYCARDIA 06/10/2009  . COLON CANCER, HX OF 09/20/2006  . HYPOTHYROIDISM 01/14/2007  . DIABETES MELLITUS, TYPE II 09/20/2006  . HYPERLIPIDEMIA 09/20/2006  . ANEMIA-NOS 06/23/2008  . DEMENTIA 05/14/2007  . DEPRESSION 02/27/2007  . SLEEP APNEA, OBSTRUCTIVE 01/14/2007  . HYPERTENSION 09/20/2006  . GERD 01/14/2007  . OSTEOPOROSIS 10/08/2006  . PERIPHERAL EDEMA 09/03/2007  . RENAL INSUFFICIENCY 04/01/2010  . B12 deficiency 05/25/2010  . CLOSTRIDIUM DIFFICILE COLITIS 01/30/2007  . THYROID NODULE 01/14/2007  . CONSTIPATION, RECURRENT 01/06/2010  . BENIGN PROSTATIC HYPERTROPHY 10/08/2006  . ARTHRITIS 02/23/2007  . BACK PAIN, THORACIC REGION 06/04/2008  . COLONIC POLYPS, HX OF 10/08/2006  . NEPHROLITHIASIS, HX OF 01/14/2007  . Cancer of colon dx'd 1998  . Cancer of vocal cord dx'd 1998  . NEOP, MALIGNANT, GLOTTIS 09/20/2006    Past Surgical History  Procedure Date  . Cholecystectomy 1999  . Inguinal herniorrhapy 1984  . Appendectomy 1998  . Colon resection 1998    History   Social History  . Marital Status: Married    Spouse Name: N/A    Number of Children: 2  . Years of Education: N/A   Occupational History  . retired      Dealer  .      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   Social History Main Topics  . Smoking status: Never Smoker   . Smokeless tobacco: Not on file  . Alcohol Use: No  . Drug Use: Not on file  . Sexually Active: Not on file   Other Topics Concern  . Not on file   Social History Narrative   Family history of Alcoholism/Addiction    Current Outpatient Prescriptions on File Prior to Visit  Medication Sig Dispense Refill  . amLODipine-olmesartan (AZOR) 5-40 MG per tablet Take 1 tablet by mouth every morning.       Marland Kitchen buPROPion (WELLBUTRIN SR) 150 MG 12 hr tablet Take 150 mg by mouth daily.        . citalopram (CELEXA) 20 MG tablet TAKE 1 TABLET BY MOUTH ONCE A DAY  30 tablet  11  . docusate sodium (COLACE) 100 MG capsule Take 100 mg by mouth at bedtime.        . fenofibrate 160 MG tablet Take 160 mg by mouth daily.       . furosemide (LASIX) 20 MG tablet Take 1 tablet (20 mg total) by mouth  daily.  30 tablet  0  . hydrALAZINE (APRESOLINE) 25 MG tablet Take 1 tablet (25 mg total) by mouth 3 (three) times daily.  90 tablet  11  . lansoprazole (PREVACID) 30 MG capsule TAKE 1 CAPSULE BY MOUTH EVERY DAY  30 capsule  11  . levothyroxine (SYNTHROID, LEVOTHROID) 100 MCG tablet Take 100 mcg by mouth every morning.       . lipase/protease/amylase (CREON-10/PANCREASE) 12000 UNITS CPEP Take 1 capsule by mouth 3 (three) times daily before meals.        Marland Kitchen OLANZapine (ZYPREXA) 5 MG tablet TAKE 1 TABLET BY MOUTH ONCE A DAY  30 tablet  9  . sitaGLIPtin (JANUVIA) 50 MG tablet Take 50 mg by mouth daily.        . Tamsulosin HCl (FLOMAX) 0.4 MG CAPS Take 0.4 mg by mouth at bedtime.       Marland Kitchen warfarin (COUMADIN) 5 MG tablet 2.5 mg on T,TH,Sat,Sun and 5 mg on M,W,F   30 tablet  0   Current Facility-Administered Medications on File Prior to Visit  Medication Dose Route Frequency Provider Last Rate Last Dose  . DISCONTD: albuterol  (PROVENTIL HFA;VENTOLIN HFA) 108 (90 BASE) MCG/ACT inhaler 2 puff  2 puff Inhalation Q6H PRN Michelle A. Ashley Royalty      . DISCONTD: amLODipine (NORVASC) tablet 5 mg  5 mg Oral Daily Michelle A. Matthews   5 mg at 01/08/11 1000  . DISCONTD: aspirin EC tablet 81 mg  81 mg Oral QHS Michelle A. Ashley Royalty      . DISCONTD: buPROPion (WELLBUTRIN SR) 12 hr tablet 150 mg  150 mg Oral Daily Michelle A. Matthews   150 mg at 01/08/11 1000  . DISCONTD: citalopram (CELEXA) tablet 20 mg  20 mg Oral Daily Michelle A. Matthews   20 mg at 01/08/11 1000  . DISCONTD: docusate sodium (COLACE) capsule 100 mg  100 mg Oral QHS Michelle A. Ashley Royalty      . DISCONTD: fenofibrate tablet 160 mg  160 mg Oral Daily Michelle A. Matthews   160 mg at 01/08/11 1000  . DISCONTD: furosemide (LASIX) tablet 20 mg  20 mg Oral Daily Dannielle Karvonen Dulaney, PHARMD   20 mg at 01/08/11 1000  . DISCONTD: hydrALAZINE (APRESOLINE) tablet 25 mg  25 mg Oral TID Marcelino Duster A. Matthews   25 mg at 01/08/11 1000  . DISCONTD: HYDROmorphone (DILAUDID) injection 0.5-1 mg  0.5-1 mg Intravenous Q4H PRN Michelle A. Ashley Royalty      . DISCONTD: insulin aspart (novoLOG) injection 0-15 Units  0-15 Units Subcutaneous TID WC Michelle A. Ashley Royalty      . DISCONTD: levothyroxine (SYNTHROID, LEVOTHROID) tablet 100 mcg  100 mcg Oral QAC breakfast Michelle A. Matthews   100 mcg at 01/08/11 0800  . DISCONTD: linagliptin (TRADJENTA) tablet 5 mg  5 mg Oral Daily Michelle A. Matthews   5 mg at 01/08/11 1000  . DISCONTD: lipase/protease/amylase (CREON-10/PANCREASE) capsule 1 capsule  1 capsule Oral TID AC Michelle A. Ashley Royalty      . DISCONTD: OLANZapine (ZYPREXA) tablet 5 mg  5 mg Oral Daily Michelle A. Matthews   5 mg at 01/08/11 1000  . DISCONTD: olmesartan (BENICAR) tablet 40 mg  40 mg Oral Daily Michelle A. Matthews   40 mg at 01/08/11 1000  . DISCONTD: ondansetron (ZOFRAN) injection 4 mg  4 mg Intravenous Q6H PRN Michelle A. Ashley Royalty      . DISCONTD: pantoprazole (PROTONIX)  EC tablet 40 mg  40 mg Oral Q1200 Michelle A.  Matthews   40 mg at 01/08/11 1200  . DISCONTD: polyethylene glycol (MIRALAX / GLYCOLAX) packet 17 g  17 g Oral Daily Michelle A. Ashley Royalty      . DISCONTD: sodium chloride 0.9 % injection 3 mL  3 mL Intravenous Q12H Michelle A. Matthews   3 mL at 01/08/11 1000  . DISCONTD: Tamsulosin HCl (FLOMAX) capsule 0.4 mg  0.4 mg Oral QHS Michelle A. Ashley Royalty      . DISCONTD: warfarin (COUMADIN) tablet 2.5 mg  2.5 mg Oral ONCE-1800 Juliann Pulse, PHARMD        Allergies  Allergen Reactions  . Biaxin (Clarithromycin)   . Sulfa Antibiotics     Family History  Problem Relation Age of Onset  . Diabetes Mother   . Stroke Father     BP 162/72  Pulse 74  Temp(Src) 98.2 F (36.8 C) (Oral)  Ht 5\' 6"  (1.676 m)  Wt 175 lb 12 oz (79.72 kg)  BMI 28.37 kg/m2  SpO2 96%   Review of Systems No change in chronic doe.  He says depression is well-controlled    Objective:   Physical Exam VITAL SIGNS:  See vs page GENERAL: no distress Right leg: 2+ edema PSYCH: Alert and oriented x 3.  Does not appear anxious nor depressed. Gait: slow but steady, with a walker.  Lab Results  Component Value Date   INR 3.6* 01/09/2011   INR 2.66* 01/08/2011   INR 2.67* 01/07/2011      Assessment & Plan:  Dvt, clinically slightly better Depression, well-controlled. Anticoagulation, slightly supertherapeutic.   Htn, with ? Of situational component

## 2011-01-09 NOTE — Telephone Encounter (Signed)
Pt advised of result but states that he was Rx'd 5 mg Mon Wed And Fri and 2.5 mg Tues Thur Sat and Sun. Should pt take 1 tab or 0.5 tab daily, please advise.

## 2011-01-09 NOTE — Telephone Encounter (Signed)
Spoke with pt's wife and informed her to take 1/2 pill daily starting tomorrow.

## 2011-01-12 ENCOUNTER — Ambulatory Visit (INDEPENDENT_AMBULATORY_CARE_PROVIDER_SITE_OTHER): Payer: Medicare Other | Admitting: Internal Medicine

## 2011-01-12 ENCOUNTER — Encounter: Payer: Self-pay | Admitting: Internal Medicine

## 2011-01-12 ENCOUNTER — Other Ambulatory Visit (INDEPENDENT_AMBULATORY_CARE_PROVIDER_SITE_OTHER): Payer: Medicare Other

## 2011-01-12 VITALS — BP 132/60 | HR 60 | Temp 98.1°F | Ht 69.0 in | Wt 175.0 lb

## 2011-01-12 DIAGNOSIS — I82409 Acute embolism and thrombosis of unspecified deep veins of unspecified lower extremity: Secondary | ICD-10-CM

## 2011-01-12 DIAGNOSIS — Z7901 Long term (current) use of anticoagulants: Secondary | ICD-10-CM

## 2011-01-12 DIAGNOSIS — F329 Major depressive disorder, single episode, unspecified: Secondary | ICD-10-CM

## 2011-01-12 HISTORY — DX: Acute embolism and thrombosis of unspecified deep veins of unspecified lower extremity: I82.409

## 2011-01-12 LAB — PROTIME-INR: INR: 2.1 ratio — ABNORMAL HIGH (ref 0.8–1.0)

## 2011-01-12 NOTE — Patient Instructions (Addendum)
You can take tylenol arthritis up to 1 every 8 hrs as needed for pain Continue all other medications as before, including the coumadin at 2.5 mg Please go to LAB in the Basement for the blood and/or urine tests to be done today You will be contacted regarding the referral for: renal - Dr Melodie Bouillon Please keep your appointments with your specialists as you have planned OK to stop the wellbutrin for now

## 2011-01-12 NOTE — Progress Notes (Signed)
Subjective:    Patient ID: Nathan Blackwell, male    DOB: Jul 24, 1920, 77 y.o.   MRN: [id number]  HPI  Here s/p hospn for massive DVT of RLE, overall doing well except for ongoing discomfort, not sure if can take tylenol with the coumadin so didn't, does know he should not take nsaids or asa.  Pt denies chest pain, increased sob or doe, wheezing, orthopnea, PND, increased LE swelling, palpitations, dizziness or syncope.  Pt denies new neurological symptoms such as new headache, or facial or extremity weakness or numbness   Pt denies polydipsia, polyuria.  Despite RLE pain has not fallen.  No overt bleeding or bruising that is new.  Has seen Dr Everardo All 3 days ago.  INR today done stat is 2.5, while cut back to 2.5 mg coumadin daily from the d/c of 5 mg Also -   Has some dysphagia/choking with only the wellbutrin pill.  Denies worsening depressive symptoms, suicidal ideation, or panic, though has ongoing anxiety, wife remiains supportive.  Was also seen by nephrology in consultation the day prior to d/c, and felt to need ongoing f/u for CKD, but not given an appt so far and wife is lost on this, wants to make sure he gets approp f/u. Past Medical History  Diagnosis Date  . Diabetes mellitus type II   . Hyperlipidemia   . Hypertension   . Osteoporosis   . Prostatic hypertrophy     benign  . OSA (obstructive sleep apnea)   . Rotator cuff syndrome     chronic  . Elevated PSA   . Hypothyroidism   . Nephrolithiasis   . Thyroid nodule   . GERD (gastroesophageal reflux disease)   . Colitis, Clostridium difficile     presumed 11/08  . Dementia   . Anemia     NOS  . Vitamin B 12 deficiency   . BRADYCARDIA 06/10/2009  . COLON CANCER, HX OF 09/20/2006  . HYPOTHYROIDISM 01/14/2007  . DIABETES MELLITUS, TYPE II 09/20/2006  . HYPERLIPIDEMIA 09/20/2006  . ANEMIA-NOS 06/23/2008  . DEMENTIA 05/14/2007  . DEPRESSION 02/27/2007  . SLEEP APNEA, OBSTRUCTIVE 01/14/2007  . HYPERTENSION 09/20/2006  . GERD 01/14/2007   . OSTEOPOROSIS 10/08/2006  . PERIPHERAL EDEMA 09/03/2007  . RENAL INSUFFICIENCY 04/01/2010  . B12 deficiency 05/25/2010  . CLOSTRIDIUM DIFFICILE COLITIS 01/30/2007  . THYROID NODULE 01/14/2007  . CONSTIPATION, RECURRENT 01/06/2010  . BENIGN PROSTATIC HYPERTROPHY 10/08/2006  . ARTHRITIS 02/23/2007  . BACK PAIN, THORACIC REGION 06/04/2008  . COLONIC POLYPS, HX OF 10/08/2006  . NEPHROLITHIASIS, HX OF 01/14/2007  . Cancer of colon dx'd 1998  . Cancer of vocal cord dx'd 1998  . 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 DVT of lower limb, acute 01/12/2011   Past Surgical History  Procedure Date  . Cholecystectomy 1999  . Inguinal herniorrhapy 1984  . Appendectomy 1998  . Colon resection 1998    reports that he has never smoked. He does not have any smokeless tobacco history on file. He reports that he does not drink alcohol. His drug history not on file. family history includes Diabetes in his mother and Stroke in his father. Allergies  Allergen Reactions  . Biaxin (Clarithromycin)   . Sulfa Antibiotics    Current Outpatient Prescriptions on File Prior to Visit  Medication Sig Dispense Refill  . amLODipine-olmesartan (AZOR) 5-40 MG per tablet Take 1 tablet by mouth every morning.       . citalopram (CELEXA) 20 MG tablet TAKE 1 TABLET BY  MOUTH ONCE A DAY  30 tablet  11  . docusate sodium (COLACE) 100 MG capsule Take 100 mg by mouth at bedtime.        . fenofibrate 160 MG tablet Take 160 mg by mouth daily.       . furosemide (LASIX) 20 MG tablet Take 1 tablet (20 mg total) by mouth daily.  30 tablet  0  . hydrALAZINE (APRESOLINE) 25 MG tablet Take 1 tablet (25 mg total) by mouth 3 (three) times daily.  90 tablet  11  . lansoprazole (PREVACID) 30 MG capsule TAKE 1 CAPSULE BY MOUTH EVERY DAY  30 capsule  11  . levothyroxine (SYNTHROID, LEVOTHROID) 100 MCG tablet Take 100 mcg by mouth every morning.       . lipase/protease/amylase (CREON-10/PANCREASE) 12000 UNITS CPEP Take 1 capsule by mouth 3 (three)  times daily before meals.        Marland Kitchen OLANZapine (ZYPREXA) 5 MG tablet TAKE 1 TABLET BY MOUTH ONCE A DAY  30 tablet  9  . sitaGLIPtin (JANUVIA) 50 MG tablet Take 50 mg by mouth daily.        . Tamsulosin HCl (FLOMAX) 0.4 MG CAPS Take 0.4 mg by mouth at bedtime.       Marland Kitchen warfarin (COUMADIN) 5 MG tablet 2.5 mg on T,TH,Sat,Sun and 5 mg on M,W,F   30 tablet  0   Review of Systems Review of Systems  Constitutional: Negative for diaphoresis and unexpected weight change.  HENT: Negative for drooling and tinnitus.   Eyes: Negative for photophobia and visual disturbance.  Respiratory: Negative for choking and stridor.   Gastrointestinal: Negative for vomiting and blood in stool.  Genitourinary: Negative for hematuria and decreased urine volume.     Objective:   Physical Exam BP 132/60  Pulse 60  Temp(Src) 98.1 F (36.7 C) (Oral)  Ht 5\' 9"  (1.753 m)  Wt 175 lb (79.379 kg)  BMI 25.84 kg/m2  SpO2 99% Physical Exam  VS noted, not ill appearing Constitutional: Pt appears well-developed and well-nourished.  HENT: Head: Normocephalic.  Right Ear: External ear normal.  Left Ear: External ear normal.  Eyes: Conjunctivae and EOM are normal. Pupils are equal, round, and reactive to light.  Neck: Normal range of motion. Neck supple.  Cardiovascular: Normal rate and regular rhythm.   Pulmonary/Chest: Effort normal and breath sounds normal.  Neurological: Pt is alert. No cranial nerve deficit.  Skin: Skin is warm. No erythema.  Psychiatric: Pt behavior is normal. Thought content normal.  RLE with 2-3+ diffuse massive swelling without erythema, ulcer, drainage     Assessment & Plan:

## 2011-01-15 ENCOUNTER — Encounter: Payer: Self-pay | Admitting: Internal Medicine

## 2011-01-15 NOTE — Assessment & Plan Note (Signed)
I think likely just not notified yet of a f/u appt per nephrology, but per wife request will try to assist on referral as well -

## 2011-01-15 NOTE — Assessment & Plan Note (Signed)
stable overall by hx and exam, most recent data reviewed with pt, and pt to continue medical treatment as before, except ok to stop the wellbutrin, cont the citalopram

## 2011-01-15 NOTE — Assessment & Plan Note (Signed)
stable overall by hx and exam, most recent data reviewed with pt, and pt to continue medical treatment as before, referral to coumadin clinic is not an option at this time as the local coumadin clinic with established protocol and PharmD is no longer available to new primary care pts;  Will cont on the 2.5 mg but with close f/u

## 2011-01-15 NOTE — Assessment & Plan Note (Signed)
stable overall by hx and exam, most recent data reviewed with pt, and pt to continue medical treatment as before, except ok to take tylenol 8 h as directed

## 2011-01-16 ENCOUNTER — Other Ambulatory Visit (INDEPENDENT_AMBULATORY_CARE_PROVIDER_SITE_OTHER): Payer: Medicare Other

## 2011-01-16 ENCOUNTER — Ambulatory Visit (INDEPENDENT_AMBULATORY_CARE_PROVIDER_SITE_OTHER): Payer: Medicare Other | Admitting: Internal Medicine

## 2011-01-16 ENCOUNTER — Other Ambulatory Visit: Payer: Self-pay | Admitting: Internal Medicine

## 2011-01-16 VITALS — BP 132/60 | HR 64 | Temp 98.0°F | Ht 69.0 in | Wt 170.2 lb

## 2011-01-16 DIAGNOSIS — E039 Hypothyroidism, unspecified: Secondary | ICD-10-CM

## 2011-01-16 DIAGNOSIS — Z7901 Long term (current) use of anticoagulants: Secondary | ICD-10-CM

## 2011-01-16 DIAGNOSIS — F068 Other specified mental disorders due to known physiological condition: Secondary | ICD-10-CM

## 2011-01-16 DIAGNOSIS — E119 Type 2 diabetes mellitus without complications: Secondary | ICD-10-CM

## 2011-01-16 DIAGNOSIS — I82409 Acute embolism and thrombosis of unspecified deep veins of unspecified lower extremity: Secondary | ICD-10-CM

## 2011-01-16 NOTE — Assessment & Plan Note (Signed)
stable overall by hx and exam, most recent data reviewed with pt, and pt to continue medical treatment as before  Lab Results  Component Value Date   WBC 5.7 01/08/2011   HGB 10.3* 01/08/2011   HCT 31.7* 01/08/2011   PLT 248 01/08/2011   GLUCOSE 82 01/08/2011   CHOL 185 10/18/2010   TRIG 258.0* 10/18/2010   HDL 37.10* 10/18/2010   LDLDIRECT 118.3 10/18/2010   LDLCALC 92 11/18/2009   ALT 11 01/03/2011   AST 17 01/03/2011   NA 142 01/08/2011   K 4.5 01/08/2011   CL 105 01/08/2011   CREATININE 2.36* 01/08/2011   BUN 47* 01/08/2011   CO2 23 01/08/2011   TSH 1.393 07/23/2010   PSA 4.88* 01/14/2007   INR 2.1* 01/12/2011   HGBA1C 6.7* 01/01/2011   MICROALBUR 29.7* 05/18/2009

## 2011-01-16 NOTE — Assessment & Plan Note (Signed)
stable overall by hx and exam, most recent data reviewed with pt, and pt to continue medical treatment as before  Lab Results  Component Value Date   HGBA1C 6.7* 01/01/2011

## 2011-01-16 NOTE — Assessment & Plan Note (Signed)
For PT/INR, and cbc today, with f/u thur nov 15 , then hopefully monthly thereafter, as above

## 2011-01-16 NOTE — Patient Instructions (Signed)
OK to hold on having the B12 shots monthly for now Please go to LAB in the Basement for the blood and/or urine tests to be done today Please call the phone number [phone] (the [employer] System) for results of testing in 2-3 days;  When calling, simply dial the number, and when prompted enter the MRN number above (the Medical Record Number) and the # key, then the message should start. Please continue your 3 times weekly PT at home as you are doing Please also have the INR done per [facility] as planned in followup on Thursday this wk

## 2011-01-16 NOTE — Progress Notes (Signed)
Subjective:    Patient ID: Nathan Blackwell, male    DOB: 18-Jan-1921, 77 y.o.   MRN: [id number]  HPI  Here to f/u; tylenol has worked well for RLE pain, with pain and swelling decrased by at least 50% per pt since just last visit.  Pt denies chest pain, increased sob or doe, wheezing, orthopnea, PND, increased LE swelling, palpitations, dizziness or syncope.  Pt denies new neurological symptoms such as new headache, or facial or extremity weakness or numbness   Pt denies polydipsia, polyuria.   Pt denies fever, wt loss, night sweats, loss of appetite, or other constitutional symptoms  No overt bleeding or bruising.  Overall good compliance with treatment, and good medicine tolerability. Has not fallen since last visti 4 days ago, walks with walker.  No other complaints.  Wife very supportive.  Dementia overall stable symptomatically with gradual worsening at best, and not assoc with behavioral changes such as hallucinations, paranoia, or agitation.  Gets PT at home 3 times weekly, and [facility] already planning for f/u INR later this wk on Thursday nov 15.  Denies hyper or hypo thyroid symptoms such as voice, skin or hair change.  Past Medical History  Diagnosis Date  . Diabetes mellitus type II   . Hyperlipidemia   . Hypertension   . Osteoporosis   . Prostatic hypertrophy     benign  . OSA (obstructive sleep apnea)   . Rotator cuff syndrome     chronic  . Elevated PSA   . Hypothyroidism   . Nephrolithiasis   . Thyroid nodule   . GERD (gastroesophageal reflux disease)   . Colitis, Clostridium difficile     presumed 11/08  . Dementia   . Anemia     NOS  . Vitamin B 12 deficiency   . BRADYCARDIA 06/10/2009  . COLON CANCER, HX OF 09/20/2006  . HYPOTHYROIDISM 01/14/2007  . DIABETES MELLITUS, TYPE II 09/20/2006  . HYPERLIPIDEMIA 09/20/2006  . ANEMIA-NOS 06/23/2008  . DEMENTIA 05/14/2007  . DEPRESSION 02/27/2007  . SLEEP APNEA, OBSTRUCTIVE 01/14/2007  . HYPERTENSION 09/20/2006  . GERD  01/14/2007  . OSTEOPOROSIS 10/08/2006  . PERIPHERAL EDEMA 09/03/2007  . RENAL INSUFFICIENCY 04/01/2010  . B12 deficiency 05/25/2010  . CLOSTRIDIUM DIFFICILE COLITIS 01/30/2007  . THYROID NODULE 01/14/2007  . CONSTIPATION, RECURRENT 01/06/2010  . BENIGN PROSTATIC HYPERTROPHY 10/08/2006  . ARTHRITIS 02/23/2007  . BACK PAIN, THORACIC REGION 06/04/2008  . COLONIC POLYPS, HX OF 10/08/2006  . NEPHROLITHIASIS, HX OF 01/14/2007  . Cancer of colon dx'd 1998  . Cancer of vocal cord dx'd 1998  . 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 DVT of lower limb, acute 01/12/2011   Past Surgical History  Procedure Date  . Cholecystectomy 1999  . Inguinal herniorrhapy 1984  . Appendectomy 1998  . Colon resection 1998    reports that he has never smoked. He does not have any smokeless tobacco history on file. He reports that he does not drink alcohol. His drug history not on file. family history includes Diabetes in his mother and Stroke in his father. Allergies  Allergen Reactions  . Biaxin (Clarithromycin)   . Sulfa Antibiotics    Current Outpatient Prescriptions on File Prior to Visit  Medication Sig Dispense Refill  . amLODipine-olmesartan (AZOR) 5-40 MG per tablet Take 1 tablet by mouth every morning.       . citalopram (CELEXA) 20 MG tablet TAKE 1 TABLET BY MOUTH ONCE A DAY  30 tablet  11  . docusate  sodium (COLACE) 100 MG capsule Take 100 mg by mouth at bedtime.        . fenofibrate 160 MG tablet Take 160 mg by mouth daily.       . furosemide (LASIX) 20 MG tablet Take 1 tablet (20 mg total) by mouth daily.  30 tablet  0  . hydrALAZINE (APRESOLINE) 25 MG tablet Take 1 tablet (25 mg total) by mouth 3 (three) times daily.  90 tablet  11  . lansoprazole (PREVACID) 30 MG capsule TAKE 1 CAPSULE BY MOUTH EVERY DAY  30 capsule  11  . levothyroxine (SYNTHROID, LEVOTHROID) 100 MCG tablet Take 100 mcg by mouth every morning.       . lipase/protease/amylase (CREON-10/PANCREASE) 12000 UNITS CPEP Take 1 capsule by  mouth 3 (three) times daily before meals.        Marland Kitchen OLANZapine (ZYPREXA) 5 MG tablet TAKE 1 TABLET BY MOUTH ONCE A DAY  30 tablet  9  . sitaGLIPtin (JANUVIA) 50 MG tablet Take 50 mg by mouth daily.        . Tamsulosin HCl (FLOMAX) 0.4 MG CAPS Take 0.4 mg by mouth at bedtime.       Marland Kitchen warfarin (COUMADIN) 5 MG tablet 2.5 mg on T,TH,Sat,Sun and 5 mg on M,W,F   30 tablet  0   Review of Systems Review of Systems  Constitutional: Negative for diaphoresis and unexpected weight change.  HENT: Negative for drooling and tinnitus.   Eyes: Negative for photophobia and visual disturbance.  Respiratory: Negative for choking and stridor.   Gastrointestinal: Negative for vomiting and blood in stool.  Genitourinary: Negative for hematuria and decreased urine volume.      Objective:   Physical Exam BP 132/60  Pulse 64  Temp(Src) 98 F (36.7 C) (Oral)  Ht 5\' 9"  (1.753 m)  Wt 170 lb 4 oz (77.225 kg)  BMI 25.14 kg/m2  SpO2 98% Physical Exam  VS noted, not ill appearing Constitutional: Pt appears well-developed and well-nourished.  HENT: Head: Normocephalic.  Right Ear: External ear normal.  Left Ear: External ear normal.  Eyes: Conjunctivae and EOM are normal. Pupils are equal, round, and reactive to light.  Neck: Normal range of motion. Neck supple.  Cardiovascular: Normal rate and regular rhythm.   Pulmonary/Chest: Effort normal and breath sounds normal.  Neurological: Pt is alert. No cranial nerve deficit.  Skin: Skin is warm. No erythema. RLE edema decreased approx 50% from groin to foot Psychiatric: Pt behavior is normal. Thought content normal. not agitated, nervous or depressed affect    Assessment & Plan:

## 2011-01-16 NOTE — Assessment & Plan Note (Signed)
Improved symptomatically, tolerating tx well, cont PT, and as this was his second episode DVT will plan on prolonged course of tx (> 6 mo) but given advanced age will need to consider stopping coumadin for any significant bleeding recurrent and/or increased fall risk

## 2011-01-16 NOTE — Assessment & Plan Note (Signed)
stable overall by hx and exam, most recent data reviewed with pt, and pt to continue medical treatment as before  Lab Results  Component Value Date   TSH 1.393 07/23/2010   \

## 2011-01-17 ENCOUNTER — Telehealth: Payer: Self-pay

## 2011-01-17 LAB — HEMOGLOBIN A1C: Hgb A1c MFr Bld: 6.6 % — ABNORMAL HIGH (ref 4.6–6.5)

## 2011-01-17 LAB — BASIC METABOLIC PANEL
BUN: 65 mg/dL — ABNORMAL HIGH (ref 6–23)
Calcium: 9.4 mg/dL (ref 8.4–10.5)
GFR: 17.94 mL/min — ABNORMAL LOW (ref >60.00)
Glucose, Bld: 78 mg/dL (ref 70–99)
Potassium: 4.7 mEq/L (ref 3.5–5.1)

## 2011-01-17 LAB — CBC WITH DIFFERENTIAL/PLATELET
Basophils Relative: 0.2 % (ref 0.0–3.0)
Eosinophils Relative: 2.3 % (ref 0.0–5.0)
HCT: 29.5 % — ABNORMAL LOW (ref 39.0–52.0)
Lymphs Abs: 1.5 10*3/uL (ref 0.7–4.0)
MCV: 100.2 fl — ABNORMAL HIGH (ref 78.0–100.0)
Monocytes Absolute: 0.3 10*3/uL (ref 0.1–1.0)
Monocytes Relative: 4.4 % (ref 3.0–12.0)
Neutrophils Relative %: 67.5 % (ref 43.0–77.0)
RBC: 2.95 Mil/uL — ABNORMAL LOW (ref 4.22–5.81)
WBC: 6 10*3/uL (ref 4.5–10.5)

## 2011-01-17 LAB — LIPID PANEL
HDL: 45.7 mg/dL (ref >39.00)
Triglycerides: 274 mg/dL — ABNORMAL HIGH (ref 0.0–149.0)
VLDL: 54.8 mg/dL — ABNORMAL HIGH (ref 0.0–40.0)

## 2011-01-17 LAB — LDL CHOLESTEROL, DIRECT: Direct LDL: 92.1 mg/dL

## 2011-01-17 LAB — PROTIME-INR: INR: 3.1 ratio — ABNORMAL HIGH (ref 0.8–1.0)

## 2011-01-17 NOTE — Telephone Encounter (Signed)
INR 3.1 yesterday, just received result now  Pt on 2.5 mg daily except for 5 mg on tues/fri  OK to hold coumadin today, then change coumadin to 2.5 mg daily, except for 5 mg on Fridays only  Pt already has plan for f/u with [facility] for Thursday nov 15  Please forward this message to them,m and notify pt

## 2011-01-17 NOTE — Telephone Encounter (Signed)
The lab had problems with their machine yesterday, but results are completed and on your desk to review

## 2011-01-17 NOTE — Telephone Encounter (Signed)
Robin to call lab - ? Results yet?

## 2011-01-17 NOTE — Telephone Encounter (Signed)
Called informed the patient of MD's instructions. 

## 2011-01-17 NOTE — Telephone Encounter (Signed)
Patients wife called requesting lab results on coumadin check

## 2011-01-19 ENCOUNTER — Telehealth: Payer: Self-pay

## 2011-01-19 NOTE — Telephone Encounter (Signed)
Patient's wife informed

## 2011-01-19 NOTE — Telephone Encounter (Signed)
This is to be expected, and will improve but may take several wks

## 2011-01-19 NOTE — Telephone Encounter (Signed)
INR reviewed, 2.4, current dose 2.5 mg daily except for 5 mg on Fri only  Robin to notify pt/wife - to cont same dose, and [facility] to be notified f/u PT/INR in 1 wk  If ok at that point, can likely be able to get less freqeunt PT/INR

## 2011-01-19 NOTE — Telephone Encounter (Signed)
[facility], Nathan Blackwell called to inform PT 29.2 and INR 2.4, please advise on coumadin also The RN would like instructions on future INR and PT

## 2011-01-19 NOTE — Telephone Encounter (Signed)
Informed the patients wife. Nathan Blackwell informed the patient has had pain in his right leg today, please advise

## 2011-01-23 ENCOUNTER — Telehealth: Payer: Self-pay

## 2011-01-23 ENCOUNTER — Encounter: Payer: Self-pay | Admitting: Internal Medicine

## 2011-01-23 ENCOUNTER — Ambulatory Visit (INDEPENDENT_AMBULATORY_CARE_PROVIDER_SITE_OTHER): Payer: Medicare Other | Admitting: Internal Medicine

## 2011-01-23 VITALS — BP 122/52 | HR 56 | Temp 98.2°F | Ht 69.0 in | Wt 175.5 lb

## 2011-01-23 DIAGNOSIS — I82409 Acute embolism and thrombosis of unspecified deep veins of unspecified lower extremity: Secondary | ICD-10-CM

## 2011-01-23 DIAGNOSIS — I10 Essential (primary) hypertension: Secondary | ICD-10-CM

## 2011-01-23 DIAGNOSIS — M549 Dorsalgia, unspecified: Secondary | ICD-10-CM | POA: Insufficient documentation

## 2011-01-23 DIAGNOSIS — Z7901 Long term (current) use of anticoagulants: Secondary | ICD-10-CM

## 2011-01-23 MED ORDER — HYDROCODONE-ACETAMINOPHEN 7.5-500 MG PO TABS: 1.0000 | ORAL_TABLET | Freq: Four times a day (QID) | ORAL | Status: AC | PRN

## 2011-01-23 MED ORDER — CYCLOBENZAPRINE HCL 5 MG PO TABS: 5.0000 mg | ORAL_TABLET | Freq: Three times a day (TID) | ORAL | Status: AC | PRN

## 2011-01-23 NOTE — Patient Instructions (Signed)
Take all new medications as prescribed Continue all other medications as before Please hold on the leg excercises and try not to elevate too far

## 2011-01-23 NOTE — Telephone Encounter (Signed)
Will plan to see pt as noted;  Also [facility] to please do next pt/inr for Fri nov 23

## 2011-01-23 NOTE — Progress Notes (Signed)
Subjective:    Patient ID: Nathan Blackwell, male    DOB: 19-Nov-1920, 77 y.o.   MRN: [id number]  HPI  Here to f/u with acute onset LBP this AM at 1AM, across the lower back bilateral left more than right, dull and achy but sharp with movement which makes worse up to 9/10;  Better with sitting in chair still, but worse with twisting/standing up/walk as well as even lying down;  no bowel or bladder change, fever, wt loss,  worsening LE pain/numbness/weakness, gait change or falls. Seemed to occur after quite diligently trying to keep the right leg elevated above hip level to help the swelling for several days.   Denies urinary symptoms such as dysuria, frequency, urgency,or hematuria.   Pt denies chest pain, increased sob or doe, wheezing, orthopnea, PND, increased LE swelling, palpitations, dizziness or syncope.   Pt denies polydipsia, polyuria.  No overt bleeding or bruising on the coumadin Past Medical History  Diagnosis Date  . Diabetes mellitus type II   . Hyperlipidemia   . Hypertension   . Osteoporosis   . Prostatic hypertrophy     benign  . OSA (obstructive sleep apnea)   . Rotator cuff syndrome     chronic  . Elevated PSA   . Hypothyroidism   . Nephrolithiasis   . Thyroid nodule   . GERD (gastroesophageal reflux disease)   . Colitis, Clostridium difficile     presumed 11/08  . Dementia   . Anemia     NOS  . Vitamin B 12 deficiency   . BRADYCARDIA 06/10/2009  . COLON CANCER, HX OF 09/20/2006  . HYPOTHYROIDISM 01/14/2007  . DIABETES MELLITUS, TYPE II 09/20/2006  . HYPERLIPIDEMIA 09/20/2006  . ANEMIA-NOS 06/23/2008  . DEMENTIA 05/14/2007  . DEPRESSION 02/27/2007  . SLEEP APNEA, OBSTRUCTIVE 01/14/2007  . HYPERTENSION 09/20/2006  . GERD 01/14/2007  . OSTEOPOROSIS 10/08/2006  . PERIPHERAL EDEMA 09/03/2007  . RENAL INSUFFICIENCY 04/01/2010  . B12 deficiency 05/25/2010  . CLOSTRIDIUM DIFFICILE COLITIS 01/30/2007  . THYROID NODULE 01/14/2007  . CONSTIPATION, RECURRENT 01/06/2010  . BENIGN  PROSTATIC HYPERTROPHY 10/08/2006  . ARTHRITIS 02/23/2007  . BACK PAIN, THORACIC REGION 06/04/2008  . COLONIC POLYPS, HX OF 10/08/2006  . NEPHROLITHIASIS, HX OF 01/14/2007  . Cancer of colon dx'd 1998  . Cancer of vocal cord dx'd 1998  . 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 DVT of lower limb, acute 01/12/2011   Past Surgical History  Procedure Date  . Cholecystectomy 1999  . Inguinal herniorrhapy 1984  . Appendectomy 1998  . Colon resection 1998    reports that he has never smoked. He does not have any smokeless tobacco history on file. He reports that he does not drink alcohol. His drug history not on file. family history includes Diabetes in his mother and Stroke in his father. Allergies  Allergen Reactions  . Biaxin (Clarithromycin)   . Sulfa Antibiotics    Current Outpatient Prescriptions on File Prior to Visit  Medication Sig Dispense Refill  . amLODipine-olmesartan (AZOR) 5-40 MG per tablet Take 1 tablet by mouth every morning.       . citalopram (CELEXA) 20 MG tablet TAKE 1 TABLET BY MOUTH ONCE A DAY  30 tablet  11  . docusate sodium (COLACE) 100 MG capsule Take 100 mg by mouth at bedtime.        . fenofibrate 160 MG tablet Take 160 mg by mouth daily.       . furosemide (LASIX) 20 MG tablet Take  1 tablet (20 mg total) by mouth daily.  30 tablet  0  . hydrALAZINE (APRESOLINE) 25 MG tablet Take 1 tablet (25 mg total) by mouth 3 (three) times daily.  90 tablet  11  . lansoprazole (PREVACID) 30 MG capsule TAKE 1 CAPSULE BY MOUTH EVERY DAY  30 capsule  11  . levothyroxine (SYNTHROID, LEVOTHROID) 100 MCG tablet Take 100 mcg by mouth every morning.       . lipase/protease/amylase (CREON-10/PANCREASE) 12000 UNITS CPEP Take 1 capsule by mouth 3 (three) times daily before meals.        Marland Kitchen OLANZapine (ZYPREXA) 5 MG tablet TAKE 1 TABLET BY MOUTH ONCE A DAY  30 tablet  9  . sitaGLIPtin (JANUVIA) 50 MG tablet Take 50 mg by mouth daily.        . Tamsulosin HCl (FLOMAX) 0.4 MG CAPS Take 0.4 mg by  mouth at bedtime.       Marland Kitchen warfarin (COUMADIN) 5 MG tablet 2.5 mg on T,TH,Sat,Sun and 5 mg on M,W,F   30 tablet  0   Review of Systems Review of Systems  Constitutional: Negative for diaphoresis and unexpected weight change.  HENT: Negative for drooling and tinnitus.   Eyes: Negative for photophobia and visual disturbance.  Respiratory: Negative for choking and stridor.   Gastrointestinal: Negative for vomiting and blood in stool.  Genitourinary: Negative for hematuria and decreased urine volume.        Objective:   Physical Exam BP 122/52  Pulse 56  Temp(Src) 98.2 F (36.8 C) (Oral)  Ht 5\' 9"  (1.753 m)  Wt 175 lb 8 oz (79.606 kg)  BMI 25.92 kg/m2  SpO2 99% Physical Exam  VS noted, not ill appearing Constitutional: Pt appears well-developed and well-nourished.  HENT: Head: Normocephalic.  Right Ear: External ear normal.  Left Ear: External ear normal.  Eyes: Conjunctivae and EOM are normal. Pupils are equal, round, and reactive to light.  Neck: Normal range of motion. Neck supple.  Cardiovascular: Normal rate and regular rhythm.   Pulmonary/Chest: Effort normal and breath sounds normal.  Abd:  Soft, NT, non-distended, + BS Neurological: Pt is alert. No cranial nerve deficit. motor/dtr intact Skin: Skin is warm. No erythema.  Psychiatric: Pt behavior is normal. Thought content normal.  RLE with 1+ diffuse edema - overall decreased Spine nontender;  Has bilat lumbar paravertebral tender/spasm, left > right         Assessment & Plan:

## 2011-01-23 NOTE — Telephone Encounter (Signed)
Called [facility] and informed of MD's instructions. The MD and [facility] RN Britta Mccreedy agreed to check PT/INR on Thursday 01/26/2011.

## 2011-01-23 NOTE — Telephone Encounter (Signed)
Patients wife called to inform the patient is having level 9 back pain, I informed would need OV which she agreed to and the patient will see Dr. Jonny Ruiz today at 4:15. ALSO, the coumadin RN Britta Mccreedy with [facility] would like to know when next coumadin check and will need order to do so. Call back number for Britta Mccreedy is [phone]

## 2011-01-25 ENCOUNTER — Telehealth: Payer: Self-pay

## 2011-01-25 NOTE — Telephone Encounter (Signed)
Called [facility] PT left message informed of MD's instructions.

## 2011-01-25 NOTE — Telephone Encounter (Signed)
Ok for [facility] Nov 26

## 2011-01-25 NOTE — Telephone Encounter (Signed)
Ok for mon nov 26

## 2011-01-25 NOTE — Telephone Encounter (Signed)
PT for this patient would like to know specific date to start back PT. Call back number is [phone]/

## 2011-01-27 ENCOUNTER — Telehealth: Payer: Self-pay

## 2011-01-27 DIAGNOSIS — Z7901 Long term (current) use of anticoagulants: Secondary | ICD-10-CM

## 2011-01-27 NOTE — Telephone Encounter (Signed)
Per pt and RN dose verified as stated below. Per JWJ, pt is to HOLD coumadin today and tomorrow (11/23 and 11/24) and resume at 2.5 mg Sunday (11/25). Pt has also been scheduled for OV and STAT labs for Monday 11/26. Pt and spouse advised in detail.

## 2011-01-27 NOTE — Telephone Encounter (Signed)
I sincerely hope this dosing is incorrect, since as of nov 15 he is supposed to be on 2.5 mg per day, except for 5 mg on Friday only  His INR is high  Please verify dosing

## 2011-01-27 NOTE — Telephone Encounter (Signed)
RN called with pt's recent PT INR - PT 44.8 INR 3.7. Pt is alternating 1 whole 5mg  tab with 1/2 tab every day.  Pt will be discharged today.

## 2011-01-29 ENCOUNTER — Encounter: Payer: Self-pay | Admitting: Internal Medicine

## 2011-01-29 NOTE — Assessment & Plan Note (Signed)
stable overall by hx and exam, most recent data reviewed with pt, and pt to continue coumadin, f/u INR as planned

## 2011-01-29 NOTE — Assessment & Plan Note (Signed)
C/w MSK spasm most likely related to unsual positioning strain while sitting trying to keep the leg elevated;  Advised to hold on elevating the leg quite as much so as not to place so much strain, tx with pain med/muscle relaxer as needed

## 2011-01-29 NOTE — Assessment & Plan Note (Signed)
stable overall by hx and exam, most recent data reviewed with pt, and pt to continue medical treatment as before  BP Readings from Last 3 Encounters:  01/23/11 122/52  01/16/11 132/60  01/12/11 132/60

## 2011-01-29 NOTE — Assessment & Plan Note (Signed)
Recent as per emr, now with overall swelling and pain starting to decrease, pt and wife reassured, cont same tx

## 2011-01-30 ENCOUNTER — Other Ambulatory Visit (INDEPENDENT_AMBULATORY_CARE_PROVIDER_SITE_OTHER): Payer: Medicare Other

## 2011-01-30 ENCOUNTER — Ambulatory Visit (INDEPENDENT_AMBULATORY_CARE_PROVIDER_SITE_OTHER): Payer: Medicare Other | Admitting: Internal Medicine

## 2011-01-30 ENCOUNTER — Other Ambulatory Visit: Payer: Self-pay | Admitting: Internal Medicine

## 2011-01-30 ENCOUNTER — Encounter: Payer: Self-pay | Admitting: Internal Medicine

## 2011-01-30 VITALS — BP 100/62 | HR 63 | Temp 98.1°F | Ht 69.0 in | Wt 178.5 lb

## 2011-01-30 DIAGNOSIS — Z7901 Long term (current) use of anticoagulants: Secondary | ICD-10-CM

## 2011-01-30 DIAGNOSIS — I10 Essential (primary) hypertension: Secondary | ICD-10-CM

## 2011-01-30 DIAGNOSIS — M549 Dorsalgia, unspecified: Secondary | ICD-10-CM

## 2011-01-30 LAB — PROTIME-INR: INR: 2 ratio — ABNORMAL HIGH (ref 0.8–1.0)

## 2011-01-30 NOTE — Assessment & Plan Note (Signed)
stable overall by hx and exam, most recent data reviewed with pt, and pt to continue medical treatment as before  For cr check with next labs

## 2011-01-30 NOTE — Patient Instructions (Signed)
Please continue the coumadin at 2.5 mg (half pill) each day except for 5 mg on Friday only Continue all other medications as before OK to resume leg excercises and leg elevation as long as the back does not start hurting again Please return in 1 week with Lab testing done the same day before the visit

## 2011-01-30 NOTE — Assessment & Plan Note (Signed)
stable overall by hx and exam, most recent data reviewed with pt, and pt to continue medical treatment as before  BP Readings from Last 3 Encounters:  01/30/11 100/62  01/23/11 122/52  01/16/11 132/60

## 2011-01-30 NOTE — Assessment & Plan Note (Signed)
Overall stable, to continue coumadin at 2.5 mg per day, except for 5 mg on Friday only, f/u one wk

## 2011-01-30 NOTE — Assessment & Plan Note (Signed)
Resolved symptomaticly, ok to resume moderate leg pedalling excercises as before as long as does not exac his back pain

## 2011-01-30 NOTE — Progress Notes (Signed)
Subjective:    Patient ID: Nathan Blackwell, male    DOB: Jul 12, 1920, 77 y.o.   MRN: [id number]  HPI  Here to f/u; overall doing well; now taking coumadin asd, no overt bleeding or bruising, RLE no signficant change from 1 wk ago with diffuse persisttent mild to mod swelling and overall wt increase over 5 lbs;  Back pain has resolved with medical tx as per last visit, and ready to get back to trying to be more active with leg excercises, and does not elevate the legs as high as previous as well.  Dementia overall stable symptomatically with gradual worsening at best, and not assoc with behavioral changes such as hallucinations, paranoia, or agitation.  Pt denies chest pain, increased sob or doe, wheezing, orthopnea, PND, increased LE swelling, palpitations, dizziness or syncope.  Pt denies new neurological symptoms such as new headache, or facial or extremity weakness or numbness   Pt denies polydipsia, polyuria. Past Medical History  Diagnosis Date  . Diabetes mellitus type II   . Hyperlipidemia   . Hypertension   . Osteoporosis   . Prostatic hypertrophy     benign  . OSA (obstructive sleep apnea)   . Rotator cuff syndrome     chronic  . Elevated PSA   . Hypothyroidism   . Nephrolithiasis   . Thyroid nodule   . GERD (gastroesophageal reflux disease)   . Colitis, Clostridium difficile     presumed 11/08  . Dementia   . Anemia     NOS  . Vitamin B 12 deficiency   . BRADYCARDIA 06/10/2009  . COLON CANCER, HX OF 09/20/2006  . HYPOTHYROIDISM 01/14/2007  . DIABETES MELLITUS, TYPE II 09/20/2006  . HYPERLIPIDEMIA 09/20/2006  . ANEMIA-NOS 06/23/2008  . DEMENTIA 05/14/2007  . DEPRESSION 02/27/2007  . SLEEP APNEA, OBSTRUCTIVE 01/14/2007  . HYPERTENSION 09/20/2006  . GERD 01/14/2007  . OSTEOPOROSIS 10/08/2006  . PERIPHERAL EDEMA 09/03/2007  . RENAL INSUFFICIENCY 04/01/2010  . B12 deficiency 05/25/2010  . CLOSTRIDIUM DIFFICILE COLITIS 01/30/2007  . THYROID NODULE 01/14/2007  . CONSTIPATION,  RECURRENT 01/06/2010  . BENIGN PROSTATIC HYPERTROPHY 10/08/2006  . ARTHRITIS 02/23/2007  . BACK PAIN, THORACIC REGION 06/04/2008  . COLONIC POLYPS, HX OF 10/08/2006  . NEPHROLITHIASIS, HX OF 01/14/2007  . Cancer of colon dx'd 1998  . Cancer of vocal cord dx'd 1998  . 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 DVT of lower limb, acute 01/12/2011   Past Surgical History  Procedure Date  . Cholecystectomy 1999  . Inguinal herniorrhapy 1984  . Appendectomy 1998  . Colon resection 1998    reports that he has never smoked. He does not have any smokeless tobacco history on file. He reports that he does not drink alcohol. His drug history not on file. family history includes Diabetes in his mother and Stroke in his father. Allergies  Allergen Reactions  . Biaxin (Clarithromycin)   . Sulfa Antibiotics    Current Outpatient Prescriptions on File Prior to Visit  Medication Sig Dispense Refill  . amLODipine-olmesartan (AZOR) 5-40 MG per tablet Take 1 tablet by mouth every morning.       . citalopram (CELEXA) 20 MG tablet TAKE 1 TABLET BY MOUTH ONCE A DAY  30 tablet  11  . cyclobenzaprine (FLEXERIL) 5 MG tablet Take 1 tablet (5 mg total) by mouth 3 (three) times daily as needed for muscle spasms.  60 tablet  1  . docusate sodium (COLACE) 100 MG capsule Take 100 mg by mouth at bedtime.        Marland Kitchen  fenofibrate 160 MG tablet Take 160 mg by mouth daily.       . furosemide (LASIX) 20 MG tablet Take 1 tablet (20 mg total) by mouth daily.  30 tablet  0  . hydrALAZINE (APRESOLINE) 25 MG tablet Take 1 tablet (25 mg total) by mouth 3 (three) times daily.  90 tablet  11  . HYDROcodone-acetaminophen (LORTAB) 7.5-500 MG per tablet Take 1 tablet by mouth every 6 (six) hours as needed for pain.  60 tablet  1  . lansoprazole (PREVACID) 30 MG capsule TAKE 1 CAPSULE BY MOUTH EVERY DAY  30 capsule  11  . levothyroxine (SYNTHROID, LEVOTHROID) 100 MCG tablet Take 100 mcg by mouth every morning.       . lipase/protease/amylase  (CREON-10/PANCREASE) 12000 UNITS CPEP Take 1 capsule by mouth 3 (three) times daily before meals.        Marland Kitchen OLANZapine (ZYPREXA) 5 MG tablet TAKE 1 TABLET BY MOUTH ONCE A DAY  30 tablet  9  . sitaGLIPtin (JANUVIA) 50 MG tablet Take 50 mg by mouth daily.        Marland Kitchen warfarin (COUMADIN) 5 MG tablet 2.5 mg on T,TH,Sat,Sun and 5 mg on M,W,F   30 tablet  0   Review of Systems Review of Systems  Constitutional: Negative for diaphoresis and unexpected weight change.  HENT: Negative for drooling and tinnitus.   Eyes: Negative for photophobia and visual disturbance.  Respiratory: Negative for choking and stridor.   Gastrointestinal: Negative for vomiting and blood in stool.  Genitourinary: Negative for hematuria and decreased urine volume.     Objective:   Physical Exam BP 100/62  Pulse 63  Temp(Src) 98.1 F (36.7 C) (Oral)  Ht 5\' 9"  (1.753 m)  Wt 178 lb 8 oz (80.967 kg)  BMI 26.36 kg/m2  SpO2 97% Physical Exam  VS noted, not ill appearing Constitutional: Pt appears well-developed and well-nourished.  HENT: Head: Normocephalic.  Right Ear: External ear normal.  Left Ear: External ear normal.  Eyes: Conjunctivae and EOM are normal. Pupils are equal, round, and reactive to light.  Neck: Normal range of motion. Neck supple.  Cardiovascular: Normal rate and regular rhythm.   Pulmonary/Chest: Effort normal and breath sounds normal.  Neurological: Pt is alert. No cranial nerve deficit.  Skin: Skin is warm. No erythema. Diffuse nontender RLE swelling from groin to foot 1+ persists, no erythema Psychiatric: Pt behavior is normal. Thought content c/w dementia, no anxious or depressed appearing.     Assessment & Plan:

## 2011-02-07 ENCOUNTER — Ambulatory Visit (INDEPENDENT_AMBULATORY_CARE_PROVIDER_SITE_OTHER): Payer: Medicare Other | Admitting: Internal Medicine

## 2011-02-07 ENCOUNTER — Other Ambulatory Visit (INDEPENDENT_AMBULATORY_CARE_PROVIDER_SITE_OTHER): Payer: Medicare Other

## 2011-02-07 ENCOUNTER — Encounter: Payer: Self-pay | Admitting: Internal Medicine

## 2011-02-07 VITALS — BP 110/64 | HR 62 | Temp 98.0°F | Wt 174.5 lb

## 2011-02-07 DIAGNOSIS — F068 Other specified mental disorders due to known physiological condition: Secondary | ICD-10-CM

## 2011-02-07 DIAGNOSIS — Z0000 Encounter for general adult medical examination without abnormal findings: Secondary | ICD-10-CM

## 2011-02-07 DIAGNOSIS — Z7901 Long term (current) use of anticoagulants: Secondary | ICD-10-CM

## 2011-02-07 DIAGNOSIS — I10 Essential (primary) hypertension: Secondary | ICD-10-CM

## 2011-02-07 DIAGNOSIS — M549 Dorsalgia, unspecified: Secondary | ICD-10-CM

## 2011-02-07 LAB — CBC WITH DIFFERENTIAL/PLATELET
Basophils Absolute: 0 10*3/uL (ref 0.0–0.1)
Basophils Relative: 0.6 % (ref 0.0–3.0)
Eosinophils Absolute: 0.1 10*3/uL (ref 0.0–0.7)
Lymphocytes Relative: 23.3 % (ref 12.0–46.0)
MCHC: 34.4 g/dL (ref 30.0–36.0)
Neutrophils Relative %: 67.7 % (ref 43.0–77.0)
Platelets: 240 10*3/uL (ref 150.0–400.0)
RBC: 2.74 Mil/uL — ABNORMAL LOW (ref 4.22–5.81)
RDW: 13.8 % (ref 11.5–14.6)

## 2011-02-07 LAB — BASIC METABOLIC PANEL
CO2: 27 mEq/L (ref 19–32)
Calcium: 9.3 mg/dL (ref 8.4–10.5)
Creatinine, Ser: 3.1 mg/dL — ABNORMAL HIGH (ref 0.4–1.5)

## 2011-02-07 LAB — PROTIME-INR
INR: 2.6 ratio — ABNORMAL HIGH (ref 0.8–1.0)
Prothrombin Time: 29.3 s — ABNORMAL HIGH (ref 10.2–12.4)

## 2011-02-07 NOTE — Patient Instructions (Signed)
Continue all other medications as before, except you can try to take less oxycodone and flexeril to see if you can get by with less medication You should be notified if any further changes need to be done for the coumadin Please return in 1 mo with Lab testing done that day (PT/INR)

## 2011-02-08 ENCOUNTER — Other Ambulatory Visit: Payer: Self-pay | Admitting: Internal Medicine

## 2011-02-12 ENCOUNTER — Encounter: Payer: Self-pay | Admitting: Internal Medicine

## 2011-02-12 DIAGNOSIS — Z0000 Encounter for general adult medical examination without abnormal findings: Secondary | ICD-10-CM | POA: Insufficient documentation

## 2011-02-12 MED ORDER — AMLODIPINE-OLMESARTAN 5-40 MG PO TABS: 1.0000 | ORAL_TABLET | Freq: Every day | ORAL | Status: DC

## 2011-02-12 NOTE — Assessment & Plan Note (Signed)
stable overall by hx and exam, , and pt to continue medical treatment as before   

## 2011-02-12 NOTE — Assessment & Plan Note (Signed)
stable overall by hx and exam, most recent data reviewed with pt, and pt to continue medical treatment as before  BP Readings from Last 3 Encounters:  02/07/11 110/64  01/30/11 100/62  01/23/11 122/52

## 2011-02-12 NOTE — Progress Notes (Signed)
Subjective:    Patient ID: Nathan Blackwell, male    DOB: [birth year], 77 y.o.   MRN: [id number]  HPI  Here to f/u; overall doing well;  Pt denies chest pain, increased sob or doe, wheezing, orthopnea, PND, increased LE swelling, palpitations, dizziness or syncope.  Pt denies new neurological symptoms such as new headache, or facial or extremity weakness or numbness   Pt denies polydipsia, polyuria. No overt bleeding or bruising.   Pt denies fever, wt loss, night sweats, loss of appetite, or other constitutional symptoms  Dementia overall stable symptomatically with gradual worsening at best, and not assoc with behavioral changes such as hallucinations, paranoia, or agitation. TENS unit worked ok for right LBP last night o/w Pt continues to have recurring LBP without change in severity, bowel or bladder change, fever, wt loss,  worsening LE pain/numbness/weakness, gait change or falls.  RLE still stable swelling, per renal also taking lasix bid for 7 days, and azor at night Past Medical History  Diagnosis Date  . Diabetes mellitus type II   . Hyperlipidemia   . Hypertension   . Osteoporosis   . Prostatic hypertrophy     benign  . OSA (obstructive sleep apnea)   . Rotator cuff syndrome     chronic  . Elevated PSA   . Hypothyroidism   . Nephrolithiasis   . Thyroid nodule   . GERD (gastroesophageal reflux disease)   . Colitis, Clostridium difficile     presumed 11/08  . Dementia   . Anemia     NOS  . Vitamin B 12 deficiency   . BRADYCARDIA 06/10/2009  . COLON CANCER, HX OF 09/20/2006  . HYPOTHYROIDISM 01/14/2007  . DIABETES MELLITUS, TYPE II 09/20/2006  . HYPERLIPIDEMIA 09/20/2006  . ANEMIA-NOS 06/23/2008  . DEMENTIA 05/14/2007  . DEPRESSION 02/27/2007  . SLEEP APNEA, OBSTRUCTIVE 01/14/2007  . HYPERTENSION 09/20/2006  . GERD 01/14/2007  . OSTEOPOROSIS 10/08/2006  . PERIPHERAL EDEMA 09/03/2007  . RENAL INSUFFICIENCY 04/01/2010  . B12 deficiency 05/25/2010  . CLOSTRIDIUM DIFFICILE COLITIS  01/30/2007  . THYROID NODULE 01/14/2007  . CONSTIPATION, RECURRENT 01/06/2010  . BENIGN PROSTATIC HYPERTROPHY 10/08/2006  . ARTHRITIS 02/23/2007  . BACK PAIN, THORACIC REGION 06/04/2008  . COLONIC POLYPS, HX OF 10/08/2006  . NEPHROLITHIASIS, HX OF 01/14/2007  . Cancer of colon dx'd 1998  . Cancer of vocal cord dx'd 1998  . 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 DVT of lower limb, acute 01/12/2011   Past Surgical History  Procedure Date  . Cholecystectomy 1999  . Inguinal herniorrhapy 1984  . Appendectomy 1998  . Colon resection 1998    reports that he has never smoked. He does not have any smokeless tobacco history on file. He reports that he does not drink alcohol. His drug history not on file. family history includes Diabetes in his mother and Stroke in his father. Allergies  Allergen Reactions  . Biaxin (Clarithromycin)   . Sulfa Antibiotics    Current Outpatient Prescriptions on File Prior to Visit  Medication Sig Dispense Refill  . amLODipine-olmesartan (AZOR) 5-40 MG per tablet Take 1 tablet by mouth every morning.       . citalopram (CELEXA) 20 MG tablet TAKE 1 TABLET BY MOUTH ONCE A DAY  30 tablet  11  . docusate sodium (COLACE) 100 MG capsule Take 100 mg by mouth at bedtime.        . fenofibrate 160 MG tablet Take 160 mg by mouth daily.       Marland Kitchen  furosemide (LASIX) 20 MG tablet Take 1 tablet (20 mg total) by mouth daily.  30 tablet  0  . hydrALAZINE (APRESOLINE) 25 MG tablet Take 1 tablet (25 mg total) by mouth 3 (three) times daily.  90 tablet  11  . lansoprazole (PREVACID) 30 MG capsule TAKE 1 CAPSULE BY MOUTH EVERY DAY  30 capsule  11  . levothyroxine (SYNTHROID, LEVOTHROID) 100 MCG tablet Take 100 mcg by mouth every morning.       . lipase/protease/amylase (CREON-10/PANCREASE) 12000 UNITS CPEP Take 1 capsule by mouth 3 (three) times daily before meals.        Marland Kitchen OLANZapine (ZYPREXA) 5 MG tablet TAKE 1 TABLET BY MOUTH ONCE A DAY  30 tablet  9  . sitaGLIPtin (JANUVIA) 50 MG  tablet Take 50 mg by mouth daily.        . Tamsulosin HCl (FLOMAX) 0.4 MG CAPS TAKE 1 CAPSULE BY MOUTH ONCE A DAY AT BEDTIME  30 capsule  11  . warfarin (COUMADIN) 5 MG tablet 2.5 mg on T,TH,Sat,Sun and 5 mg on M,W,F   30 tablet  0   Review of Systems Review of Systems  Constitutional: Negative for diaphoresis and unexpected weight change.  HENT: Negative for drooling and tinnitus.   Eyes: Negative for photophobia and visual disturbance.  Respiratory: Negative for choking and stridor.   Gastrointestinal: Negative for vomiting and blood in stool.  Genitourinary: Negative for hematuria and decreased urine volume.     Objective:   Physical Exam BP 110/64  Pulse 62  Temp(Src) 98 F (36.7 C) (Oral)  Wt 174 lb 8 oz (79.153 kg)  SpO2 99% Physical Exam  VS noted Constitutional: Pt appears well-developed and well-nourished.  HENT: Head: Normocephalic.  Right Ear: External ear normal.  Left Ear: External ear normal.  Eyes: Conjunctivae and EOM are normal. Pupils are equal, round, and reactive to light.  Neck: Normal range of motion. Neck supple.  Cardiovascular: Normal rate and regular rhythm.   Pulmonary/Chest: Effort normal and breath sounds normal.  Abd:  Soft, NT, non-distended, + BS Neurological: Pt is alert. No cranial nerve deficit. motor/gait intact Skin: Skin is warm. No erythema. RLE 1+ diffuse swelling, no erythema Psychiatric: Pt behavior is normal. Thought content normal.     Assessment & Plan:

## 2011-02-12 NOTE — Assessment & Plan Note (Signed)
For f/u INR today, now taking the coumadin asd, no overt bleeding or signficant bruising

## 2011-02-12 NOTE — Assessment & Plan Note (Signed)
stable overall by hx and exam, most recent data reviewed with pt, and pt to continue medical treatment as before  Lab Results  Component Value Date   WBC 6.8 02/07/2011   HGB 9.3* 02/07/2011   HCT 26.9* 02/07/2011   PLT 240.0 02/07/2011   GLUCOSE 103* 02/07/2011   CHOL 162 01/16/2011   TRIG 274.0* 01/16/2011   HDL 45.70 01/16/2011   LDLDIRECT 92.1 01/16/2011   LDLCALC 92 11/18/2009   ALT 11 01/03/2011   AST 17 01/03/2011   NA 142 02/07/2011   K 4.5 02/07/2011   CL 103 02/07/2011   CREATININE 3.1* 02/07/2011   BUN 40* 02/07/2011   CO2 27 02/07/2011   TSH 2.14 01/16/2011   PSA 4.88* 01/14/2007   INR 2.6* 02/07/2011   HGBA1C 6.6* 01/16/2011   MICROALBUR 29.7* 05/18/2009

## 2011-02-13 ENCOUNTER — Other Ambulatory Visit: Payer: Self-pay

## 2011-02-13 MED ORDER — FUROSEMIDE 20 MG PO TABS: 20.0000 mg | ORAL_TABLET | Freq: Every day | ORAL | Status: DC

## 2011-02-21 ENCOUNTER — Ambulatory Visit: Payer: Medicare Other

## 2011-03-02 ENCOUNTER — Other Ambulatory Visit: Payer: Self-pay

## 2011-03-02 MED ORDER — WARFARIN SODIUM 5 MG PO TABS: ORAL_TABLET | ORAL | Status: DC

## 2011-03-10 ENCOUNTER — Encounter: Payer: Self-pay | Admitting: Internal Medicine

## 2011-03-10 ENCOUNTER — Other Ambulatory Visit: Payer: Self-pay | Admitting: Internal Medicine

## 2011-03-10 ENCOUNTER — Ambulatory Visit (INDEPENDENT_AMBULATORY_CARE_PROVIDER_SITE_OTHER): Payer: Medicare Other | Admitting: Internal Medicine

## 2011-03-10 ENCOUNTER — Other Ambulatory Visit (INDEPENDENT_AMBULATORY_CARE_PROVIDER_SITE_OTHER): Payer: Medicare Other

## 2011-03-10 VITALS — BP 152/80 | HR 65 | Temp 97.6°F | Ht 69.0 in | Wt 170.1 lb

## 2011-03-10 DIAGNOSIS — I10 Essential (primary) hypertension: Secondary | ICD-10-CM

## 2011-03-10 DIAGNOSIS — Z7901 Long term (current) use of anticoagulants: Secondary | ICD-10-CM | POA: Diagnosis not present

## 2011-03-10 DIAGNOSIS — I82409 Acute embolism and thrombosis of unspecified deep veins of unspecified lower extremity: Secondary | ICD-10-CM | POA: Diagnosis not present

## 2011-03-10 LAB — PROTIME-INR
INR: 2 ratio — ABNORMAL HIGH (ref 0.8–1.0)
Prothrombin Time: 22 s — ABNORMAL HIGH (ref 10.2–12.4)

## 2011-03-10 MED ORDER — AMLODIPINE-OLMESARTAN 10-40 MG PO TABS: 1.0000 | ORAL_TABLET | Freq: Every day | ORAL | Status: DC

## 2011-03-10 NOTE — Patient Instructions (Signed)
Take all new medications as prescribed - the azor 10/40 mg per day (start with the samples) Ok to STOP the other azor 5/40 mg per day Continue all other medications as before, including the same coumadin Please keep your appointments with your specialists as you have planned - kidney doctors Please return in 1 mo with Lab testing done that day (the INR)

## 2011-03-10 NOTE — Assessment & Plan Note (Addendum)
Uncontrolled, To incrase the azor to 10/40 from 5/40 mg;  Watch for worsening LE edema side effect,  to f/u any worsening symptoms or concerns BP Readings from Last 3 Encounters:  03/10/11 152/80  02/07/11 110/64  01/30/11 100/62

## 2011-03-10 NOTE — Assessment & Plan Note (Addendum)
INR today is 2.0 ; Adequate coverage, to cont the same coumadin, f/u 4 wks

## 2011-03-12 ENCOUNTER — Encounter: Payer: Self-pay | Admitting: Internal Medicine

## 2011-03-12 NOTE — Progress Notes (Signed)
Subjective:    Patient ID: Nathan Blackwell, male    DOB: [birth year], 78 y.o.   MRN: [id number]  HPI  Here with wife, to f/u INR done prior to visit today, and note BP has been checked regularly at home and seems to be running consistely high since last visit in the sbp 150 range.  Pt denies chest pain, increased sob or doe, wheezing, orthopnea, PND, increased LE swelling, palpitations, dizziness or syncope.  Pt denies new neurological symptoms such as new headache, or facial or extremity weakness or numbness   Pt denies polydipsia, polyuria.  Feels "cold" often at home but o/w no new complaints.  Has f/u appt with Renal jan 29.  Did have a stumble and fall against furniture last wk with a healing bruised area to left upper arm.   Pt denies fever, wt loss, night sweats, loss of appetite, or other constitutional symptoms  No other overt bleeding or bruisiing such as hematuria, BRBPR Past Medical History  Diagnosis Date  . Diabetes mellitus type II   . Hyperlipidemia   . Hypertension   . Osteoporosis   . Prostatic hypertrophy     benign  . OSA (obstructive sleep apnea)   . Rotator cuff syndrome     chronic  . Elevated PSA   . Hypothyroidism   . Nephrolithiasis   . Thyroid nodule   . GERD (gastroesophageal reflux disease)   . Colitis, Clostridium difficile     presumed 11/08  . Dementia   . Anemia     NOS  . Vitamin B 12 deficiency   . BRADYCARDIA 06/10/2009  . COLON CANCER, HX OF 09/20/2006  . HYPOTHYROIDISM 01/14/2007  . DIABETES MELLITUS, TYPE II 09/20/2006  . HYPERLIPIDEMIA 09/20/2006  . ANEMIA-NOS 06/23/2008  . DEMENTIA 05/14/2007  . DEPRESSION 02/27/2007  . SLEEP APNEA, OBSTRUCTIVE 01/14/2007  . HYPERTENSION 09/20/2006  . GERD 01/14/2007  . OSTEOPOROSIS 10/08/2006  . PERIPHERAL EDEMA 09/03/2007  . RENAL INSUFFICIENCY 04/01/2010  . B12 deficiency 05/25/2010  . CLOSTRIDIUM DIFFICILE COLITIS 01/30/2007  . THYROID NODULE 01/14/2007  . CONSTIPATION, RECURRENT 01/06/2010  . BENIGN PROSTATIC  HYPERTROPHY 10/08/2006  . ARTHRITIS 02/23/2007  . BACK PAIN, THORACIC REGION 06/04/2008  . COLONIC POLYPS, HX OF 10/08/2006  . NEPHROLITHIASIS, HX OF 01/14/2007  . Cancer of colon dx'd 1998  . Cancer of vocal cord dx'd 1998  . 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 DVT of lower limb, acute 01/12/2011   Past Surgical History  Procedure Date  . Cholecystectomy 1999  . Inguinal herniorrhapy 1984  . Appendectomy 1998  . Colon resection 1998    reports that he has never smoked. He does not have any smokeless tobacco history on file. He reports that he does not drink alcohol. His drug history not on file. family history includes Diabetes in his mother and Stroke in his father. Allergies  Allergen Reactions  . Biaxin (Clarithromycin)   . Sulfa Antibiotics    Current Outpatient Prescriptions on File Prior to Visit  Medication Sig Dispense Refill  . citalopram (CELEXA) 20 MG tablet TAKE 1 TABLET BY MOUTH ONCE A DAY  30 tablet  11  . docusate sodium (COLACE) 100 MG capsule Take 100 mg by mouth at bedtime.        . fenofibrate 160 MG tablet Take 160 mg by mouth daily.       . furosemide (LASIX) 20 MG tablet Take 1 tablet (20 mg total) by mouth daily.  30 tablet  11  .  hydrALAZINE (APRESOLINE) 25 MG tablet Take 1 tablet (25 mg total) by mouth 3 (three) times daily.  90 tablet  11  . lansoprazole (PREVACID) 30 MG capsule TAKE 1 CAPSULE BY MOUTH EVERY DAY  30 capsule  11  . lipase/protease/amylase (CREON-10/PANCREASE) 12000 UNITS CPEP Take 1 capsule by mouth 3 (three) times daily before meals.        Marland Kitchen OLANZapine (ZYPREXA) 5 MG tablet TAKE 1 TABLET BY MOUTH ONCE A DAY  30 tablet  9  . sitaGLIPtin (JANUVIA) 50 MG tablet Take 50 mg by mouth daily.        . Tamsulosin HCl (FLOMAX) 0.4 MG CAPS TAKE 1 CAPSULE BY MOUTH ONCE A DAY AT BEDTIME  30 capsule  11  . warfarin (COUMADIN) 5 MG tablet 2.5 mg on T,TH,Sat,Sun and 5 mg on M,W,F   30 tablet  11   ,Review of Systems Review of Systems  Constitutional:  Negative for diaphoresis and unexpected weight change.  HENT: Negative for drooling and tinnitus.   Eyes: Negative for photophobia and visual disturbance.  Respiratory: Negative for choking and stridor.   Gastrointestinal: Negative for vomiting and blood in stool.  Genitourinary: Negative for hematuria and decreased urine volume.   Physical Exam BP 152/80  Pulse 65  Temp(Src) 97.6 F (36.4 C) (Oral)  Ht 5\' 9"  (1.753 m)  Wt 170 lb 2 oz (77.168 kg)  BMI 25.12 kg/m2  SpO2 98% Physical Exam  VS noted Constitutional: Pt appears well-developed and well-nourished.  HENT: Head: Normocephalic.  Right Ear: External ear normal.  Left Ear: External ear normal.  Eyes: Conjunctivae and EOM are normal. Pupils are equal, round, and reactive to light.  Neck: Normal range of motion. Neck supple.  Cardiovascular: Normal rate and regular rhythm.   Pulmonary/Chest: Effort normal and breath sounds normal.  Abd:  Soft, NT, non-distended, + BS Neurological: Pt is alert. No cranial nerve deficit.  Skin: Skin is warm. No erythema. RLE with trace to 1+ chronic venous edema below the knee now Left upper lateral arm with purplish/brown area 2 cm bruise without laceratio/ulcer. Psychiatric: Pt behavior is normal. Thought content normal.     Assessment & Plan:

## 2011-03-12 NOTE — Assessment & Plan Note (Signed)
Pt reassured his now chronic RLE venous insuff is now as improved as likely will be and stable, Continue all other medications as before

## 2011-03-29 DIAGNOSIS — N184 Chronic kidney disease, stage 4 (severe): Secondary | ICD-10-CM | POA: Diagnosis not present

## 2011-03-29 DIAGNOSIS — D649 Anemia, unspecified: Secondary | ICD-10-CM | POA: Diagnosis not present

## 2011-04-04 DIAGNOSIS — D649 Anemia, unspecified: Secondary | ICD-10-CM | POA: Diagnosis not present

## 2011-04-04 DIAGNOSIS — I129 Hypertensive chronic kidney disease with stage 1 through stage 4 chronic kidney disease, or unspecified chronic kidney disease: Secondary | ICD-10-CM | POA: Diagnosis not present

## 2011-04-04 DIAGNOSIS — N2581 Secondary hyperparathyroidism of renal origin: Secondary | ICD-10-CM | POA: Diagnosis not present

## 2011-04-04 DIAGNOSIS — N184 Chronic kidney disease, stage 4 (severe): Secondary | ICD-10-CM | POA: Diagnosis not present

## 2011-04-11 ENCOUNTER — Ambulatory Visit (INDEPENDENT_AMBULATORY_CARE_PROVIDER_SITE_OTHER): Payer: Medicare Other | Admitting: Internal Medicine

## 2011-04-11 ENCOUNTER — Other Ambulatory Visit (INDEPENDENT_AMBULATORY_CARE_PROVIDER_SITE_OTHER): Payer: Medicare Other

## 2011-04-11 ENCOUNTER — Ambulatory Visit: Payer: Medicare Other | Admitting: Internal Medicine

## 2011-04-11 ENCOUNTER — Encounter: Payer: Self-pay | Admitting: Internal Medicine

## 2011-04-11 VITALS — BP 118/78 | HR 63 | Temp 97.0°F | Ht 68.0 in | Wt 173.2 lb

## 2011-04-11 DIAGNOSIS — I10 Essential (primary) hypertension: Secondary | ICD-10-CM | POA: Diagnosis not present

## 2011-04-11 DIAGNOSIS — E119 Type 2 diabetes mellitus without complications: Secondary | ICD-10-CM | POA: Diagnosis not present

## 2011-04-11 DIAGNOSIS — Z7901 Long term (current) use of anticoagulants: Secondary | ICD-10-CM

## 2011-04-11 LAB — PROTIME-INR: Prothrombin Time: 23.2 s — ABNORMAL HIGH (ref 10.2–12.4)

## 2011-04-11 NOTE — Assessment & Plan Note (Signed)
stable overall by hx and exam, most recent data reviewed with pt, and pt to continue medical treatment as before  Lab Results  Component Value Date   HGBA1C 6.6* 01/16/2011  \ 

## 2011-04-11 NOTE — Assessment & Plan Note (Signed)
stable overall by hx and exam, most recent data reviewed with pt, and pt to continue medical treatment as before  BP Readings from Last 3 Encounters:  04/11/11 118/78  03/10/11 152/80  02/07/11 110/64

## 2011-04-11 NOTE — Progress Notes (Signed)
Subjective:    Patient ID: Nathan Blackwell, male    DOB: [birth year], 78 y.o.   MRN: [id number]  HPI  Here to f/u; overall doing well.  Pt denies chest pain, increased sob or doe, wheezing, orthopnea, PND, increased LE swelling, palpitations, dizziness or syncope.  Overall good compliance with treatment, and good medicine tolerability.   Pt denies polydipsia, polyuria. No overt bleeding or bruising.  Current INR today 2.1.  Overall good compliance with treatment, and good medicine tolerability.  No acute compalints.  Overall good compliance with treatment, and good medicine tolerability.  Denies worsening depressive symptoms, suicidal ideation, or panic.   Past Medical History  Diagnosis Date  . Diabetes mellitus type II   . Hyperlipidemia   . Hypertension   . Osteoporosis   . Prostatic hypertrophy     benign  . OSA (obstructive sleep apnea)   . Rotator cuff syndrome     chronic  . Elevated PSA   . Hypothyroidism   . Nephrolithiasis   . Thyroid nodule   . GERD (gastroesophageal reflux disease)   . Colitis, Clostridium difficile     presumed 11/08  . Dementia   . Anemia     NOS  . Vitamin B 12 deficiency   . BRADYCARDIA 06/10/2009  . COLON CANCER, HX OF 09/20/2006  . HYPOTHYROIDISM 01/14/2007  . DIABETES MELLITUS, TYPE II 09/20/2006  . HYPERLIPIDEMIA 09/20/2006  . ANEMIA-NOS 06/23/2008  . DEMENTIA 05/14/2007  . DEPRESSION 02/27/2007  . SLEEP APNEA, OBSTRUCTIVE 01/14/2007  . HYPERTENSION 09/20/2006  . GERD 01/14/2007  . OSTEOPOROSIS 10/08/2006  . PERIPHERAL EDEMA 09/03/2007  . RENAL INSUFFICIENCY 04/01/2010  . B12 deficiency 05/25/2010  . CLOSTRIDIUM DIFFICILE COLITIS 01/30/2007  . THYROID NODULE 01/14/2007  . CONSTIPATION, RECURRENT 01/06/2010  . BENIGN PROSTATIC HYPERTROPHY 10/08/2006  . ARTHRITIS 02/23/2007  . BACK PAIN, THORACIC REGION 06/04/2008  . COLONIC POLYPS, HX OF 10/08/2006  . NEPHROLITHIASIS, HX OF 01/14/2007  . Cancer of colon dx'd 1998  . Cancer of vocal cord dx'd 1998  .  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 DVT of lower limb, acute 01/12/2011   Past Surgical History  Procedure Date  . Cholecystectomy 1999  . Inguinal herniorrhapy 1984  . Appendectomy 1998  . Colon resection 1998    reports that he has never smoked. He does not have any smokeless tobacco history on file. He reports that he does not drink alcohol. His drug history not on file. family history includes Diabetes in his mother and Stroke in his father. Allergies  Allergen Reactions  . Biaxin (Clarithromycin)   . Sulfa Antibiotics    Current Outpatient Prescriptions on File Prior to Visit  Medication Sig Dispense Refill  . amLODipine-olmesartan (AZOR) 10-40 MG per tablet Take 1 tablet by mouth daily.  90 tablet  11  . citalopram (CELEXA) 20 MG tablet TAKE 1 TABLET BY MOUTH ONCE A DAY  30 tablet  11  . docusate sodium (COLACE) 100 MG capsule Take 100 mg by mouth at bedtime.        . fenofibrate 160 MG tablet Take 160 mg by mouth daily.       . furosemide (LASIX) 20 MG tablet Take 1 tablet (20 mg total) by mouth daily.  30 tablet  11  . hydrALAZINE (APRESOLINE) 25 MG tablet Take 1 tablet (25 mg total) by mouth 3 (three) times daily.  90 tablet  11  . lansoprazole (PREVACID) 30 MG capsule TAKE 1 CAPSULE BY MOUTH EVERY DAY  30 capsule  11  . lipase/protease/amylase (CREON-10/PANCREASE) 12000 UNITS CPEP Take 1 capsule by mouth 3 (three) times daily before meals.        Marland Kitchen OLANZapine (ZYPREXA) 5 MG tablet TAKE 1 TABLET BY MOUTH ONCE A DAY  30 tablet  9  . sitaGLIPtin (JANUVIA) 50 MG tablet Take 50 mg by mouth daily.        Marland Kitchen SYNTHROID 100 MCG tablet TAKE 1 TABLET BY MOUTH ONCE A DAY  30 tablet  11  . Tamsulosin HCl (FLOMAX) 0.4 MG CAPS TAKE 1 CAPSULE BY MOUTH ONCE A DAY AT BEDTIME  30 capsule  11  . warfarin (COUMADIN) 5 MG tablet 2.5 mg on T,TH,Sat,Sun and 5 mg on M,W,F   30 tablet  11   Review of Systems Review of Systems  Constitutional: Negative for diaphoresis and unexpected weight change.   HENT: Negative for drooling and tinnitus.   Eyes: Negative for photophobia and visual disturbance.  Respiratory: Negative for choking and stridor.   Gastrointestinal: Negative for vomiting and blood in stool.  Genitourinary: Negative for hematuria and decreased urine volume.     Objective:   Physical Exam BP 118/78  Pulse 63  Temp(Src) 97 F (36.1 C) (Oral)  Ht 5\' 8"  (1.727 m)  Wt 173 lb 4 oz (78.586 kg)  BMI 26.34 kg/m2  SpO2 99% Physical Exam  VS noted, not ill appearing Constitutional: Pt appears well-developed and well-nourished.  HENT: Head: Normocephalic.  Right Ear: External ear normal.  Left Ear: External ear normal.  Eyes: Conjunctivae and EOM are normal. Pupils are equal, round, and reactive to light.  Neck: Normal range of motion. Neck supple.  Cardiovascular: Normal rate and regular rhythm.   Pulmonary/Chest: Effort normal and breath sounds normal.  Skin: no bleeding/bruising    Assessment & Plan:

## 2011-04-11 NOTE — Patient Instructions (Signed)
Continue all other medications as before Please return in 5 wks with Lab testing done that day

## 2011-04-28 DIAGNOSIS — E119 Type 2 diabetes mellitus without complications: Secondary | ICD-10-CM | POA: Diagnosis not present

## 2011-04-28 DIAGNOSIS — L608 Other nail disorders: Secondary | ICD-10-CM | POA: Diagnosis not present

## 2011-04-28 DIAGNOSIS — L84 Corns and callosities: Secondary | ICD-10-CM | POA: Diagnosis not present

## 2011-05-16 ENCOUNTER — Other Ambulatory Visit (INDEPENDENT_AMBULATORY_CARE_PROVIDER_SITE_OTHER): Payer: Medicare Other

## 2011-05-16 ENCOUNTER — Ambulatory Visit (INDEPENDENT_AMBULATORY_CARE_PROVIDER_SITE_OTHER): Payer: Medicare Other | Admitting: Internal Medicine

## 2011-05-16 ENCOUNTER — Encounter: Payer: Self-pay | Admitting: Internal Medicine

## 2011-05-16 DIAGNOSIS — E119 Type 2 diabetes mellitus without complications: Secondary | ICD-10-CM

## 2011-05-16 DIAGNOSIS — Z7901 Long term (current) use of anticoagulants: Secondary | ICD-10-CM

## 2011-05-16 DIAGNOSIS — I10 Essential (primary) hypertension: Secondary | ICD-10-CM | POA: Diagnosis not present

## 2011-05-16 DIAGNOSIS — N183 Chronic kidney disease, stage 3 unspecified: Secondary | ICD-10-CM

## 2011-05-16 DIAGNOSIS — D649 Anemia, unspecified: Secondary | ICD-10-CM

## 2011-05-16 NOTE — Assessment & Plan Note (Signed)
With even minor BRBPR will need cbc f/u - o/w stable  Lab Results  Component Value Date   WBC 6.8 02/07/2011   HGB 9.3* 02/07/2011   HCT 26.9* 02/07/2011   MCV 97.9 02/07/2011   PLT 240.0 02/07/2011

## 2011-05-16 NOTE — Assessment & Plan Note (Signed)
stable overall by hx and exam, most recent data reviewed with pt, and pt to continue medical treatment as before Lab Results  Component Value Date   INR 2.9* 05/16/2011   INR 2.1* 04/11/2011   INR 2.0* 03/10/2011

## 2011-05-16 NOTE — Progress Notes (Signed)
Subjective:    Patient ID: Nathan Blackwell, male    DOB: [birth year], 78 y.o.   MRN: [id number]  HPI  Here to f/u;  Had INR drawn earlier this am. No s/s of bleeding/bruising except rare hemorrhoid he thinks.  No pain.  Pt denies chest pain, increased sob or doe, wheezing, orthopnea, PND, increased LE swelling, palpitations, dizziness or syncope.  Pt denies new neurological symptoms such as new headache, or facial or extremity weakness or numbness   Pt denies polydipsia, polyuria, or low sugar symptoms such as weakness or confusion improved with po intake.  Pt states overall good compliance with meds, trying to follow lower cholesterol, diabetic diet, wt overall stable but little exercise however.    Past Medical History  Diagnosis Date  . Diabetes mellitus type II   . Hyperlipidemia   . Hypertension   . Osteoporosis   . Prostatic hypertrophy     benign  . OSA (obstructive sleep apnea)   . Rotator cuff syndrome     chronic  . Elevated PSA   . Hypothyroidism   . Nephrolithiasis   . Thyroid nodule   . GERD (gastroesophageal reflux disease)   . Colitis, Clostridium difficile     presumed 11/08  . Dementia   . Anemia     NOS  . Vitamin B 12 deficiency   . BRADYCARDIA 06/10/2009  . COLON CANCER, HX OF 09/20/2006  . HYPOTHYROIDISM 01/14/2007  . DIABETES MELLITUS, TYPE II 09/20/2006  . HYPERLIPIDEMIA 09/20/2006  . ANEMIA-NOS 06/23/2008  . DEMENTIA 05/14/2007  . DEPRESSION 02/27/2007  . SLEEP APNEA, OBSTRUCTIVE 01/14/2007  . HYPERTENSION 09/20/2006  . GERD 01/14/2007  . OSTEOPOROSIS 10/08/2006  . PERIPHERAL EDEMA 09/03/2007  . RENAL INSUFFICIENCY 04/01/2010  . B12 deficiency 05/25/2010  . CLOSTRIDIUM DIFFICILE COLITIS 01/30/2007  . THYROID NODULE 01/14/2007  . CONSTIPATION, RECURRENT 01/06/2010  . BENIGN PROSTATIC HYPERTROPHY 10/08/2006  . ARTHRITIS 02/23/2007  . BACK PAIN, THORACIC REGION 06/04/2008  . COLONIC POLYPS, HX OF 10/08/2006  . NEPHROLITHIASIS, HX OF 01/14/2007  . Cancer of colon dx'd  1998  . Cancer of vocal cord dx'd 1998  . 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 DVT of lower limb, acute 01/12/2011   Past Surgical History  Procedure Date  . Cholecystectomy 1999  . Inguinal herniorrhapy 1984  . Appendectomy 1998  . Colon resection 1998    reports that he has never smoked. He does not have any smokeless tobacco history on file. He reports that he does not drink alcohol. His drug history not on file. family history includes Diabetes in his mother and Stroke in his father. Allergies  Allergen Reactions  . Biaxin (Clarithromycin)   . Sulfa Antibiotics    Current Outpatient Prescriptions on File Prior to Visit  Medication Sig Dispense Refill  . amLODipine-olmesartan (AZOR) 10-40 MG per tablet Take 1 tablet by mouth daily.  90 tablet  11  . citalopram (CELEXA) 20 MG tablet TAKE 1 TABLET BY MOUTH ONCE A DAY  30 tablet  11  . docusate sodium (COLACE) 100 MG capsule Take 100 mg by mouth at bedtime.        . fenofibrate 160 MG tablet Take 160 mg by mouth daily.       . furosemide (LASIX) 20 MG tablet Take 1 tablet (20 mg total) by mouth daily.  30 tablet  11  . hydrALAZINE (APRESOLINE) 25 MG tablet Take 1 tablet (25 mg total) by mouth 3 (three) times daily.  90 tablet  11  . lansoprazole (PREVACID) 30 MG capsule TAKE 1 CAPSULE BY MOUTH EVERY DAY  30 capsule  11  . lipase/protease/amylase (CREON-10/PANCREASE) 12000 UNITS CPEP Take 1 capsule by mouth 3 (three) times daily before meals.        Marland Kitchen OLANZapine (ZYPREXA) 5 MG tablet TAKE 1 TABLET BY MOUTH ONCE A DAY  30 tablet  9  . sitaGLIPtin (JANUVIA) 50 MG tablet Take 50 mg by mouth daily.        Marland Kitchen SYNTHROID 100 MCG tablet TAKE 1 TABLET BY MOUTH ONCE A DAY  30 tablet  11  . Tamsulosin HCl (FLOMAX) 0.4 MG CAPS TAKE 1 CAPSULE BY MOUTH ONCE A DAY AT BEDTIME  30 capsule  11  . warfarin (COUMADIN) 5 MG tablet 2.5 mg on T,TH,Sat,Sun and 5 mg on M,W,F   30 tablet  11   Review of Systems Review of Systems  Constitutional: Negative  for diaphoresis and unexpected weight change.  HENT: Negative for drooling and tinnitus.   Eyes: Negative for photophobia and visual disturbance.  Respiratory: Negative for choking and stridor.   Gastrointestinal: Negative for vomiting and blood in stool.  Genitourinary: Negative for hematuria and decreased urine volume.  Objective:   Physical Exam BP 120/62  Pulse 62  Temp(Src) 97.5 F (36.4 C) (Oral)  Ht 5\' 9"  (1.753 m)  Wt 176 lb 6 oz (80.003 kg)  BMI 26.05 kg/m2  SpO2 98% Physical Exam  VS noted Constitutional: Pt appears well-developed and well-nourished.  HENT: Head: Normocephalic.  Right Ear: External ear normal.  Left Ear: External ear normal.  Eyes: Conjunctivae and EOM are normal. Pupils are equal, round, and reactive to light.  Neck: Normal range of motion. Neck supple.  Cardiovascular: Normal rate and regular rhythm.   Pulmonary/Chest: Effort normal and breath sounds normal.  Abd:  Soft, NT, non-distended, + BS Neurological: Pt is alert. No cranial nerve deficit.  Skin: Skin is warm. No erythema.  RLE with persistent 1-2 + chronic edema, LLE with trace to 1+ edema Psychiatric: Pt behavior is normal. Thought content c/w dementia mild to mod Assessment & Plan:

## 2011-05-16 NOTE — Assessment & Plan Note (Signed)
stable overall by hx and exam, most recent data reviewed with pt, and pt to continue medical treatment as before  Lab Results  Component Value Date   HGBA1C 6.6* 01/16/2011  \

## 2011-05-16 NOTE — Assessment & Plan Note (Signed)
stable overall by hx and exam, most recent data reviewed with pt, and pt to continue medical treatment as before  BP Readings from Last 3 Encounters:  05/16/11 120/62  04/11/11 118/78  03/10/11 152/80

## 2011-05-16 NOTE — Patient Instructions (Signed)
Your INR is still pending from today, but you should be contacted later today with the result, and if any change to coumadin that needs to be made Continue all other medications as before Please return in 6 weeks with Lab testing done that day (we will order in the computer today for next time)

## 2011-05-16 NOTE — Assessment & Plan Note (Signed)
stable overall by hx and exam, most recent data reviewed with pt, and pt to continue medical treatment as before Lab Results  Component Value Date   CREATININE 3.1* 02/07/2011

## 2011-06-03 ENCOUNTER — Ambulatory Visit (INDEPENDENT_AMBULATORY_CARE_PROVIDER_SITE_OTHER): Payer: Medicare Other | Admitting: Family Medicine

## 2011-06-03 ENCOUNTER — Encounter: Payer: Self-pay | Admitting: Family Medicine

## 2011-06-03 VITALS — BP 110/60 | Temp 97.8°F | Wt 170.0 lb

## 2011-06-03 DIAGNOSIS — R21 Rash and other nonspecific skin eruption: Secondary | ICD-10-CM

## 2011-06-03 MED ORDER — TRIAMCINOLONE ACETONIDE 0.1 % EX CREA: TOPICAL_CREAM | Freq: Two times a day (BID) | CUTANEOUS | Status: AC

## 2011-06-03 NOTE — Progress Notes (Signed)
  Subjective:    Patient ID: Nathan Blackwell, male    DOB: 08-18-1920, 78 y.o.   MRN: [id number]  HPI  Acute visit. Seen with somewhat pruritic rash right lateral leg. Wife was concerned this may be shingles. No associated pain. No vesicles. No pustules. Tried over-the-counter hydrocortisone cream with minimal relief. Generally has dry skin. Not using consistent moisturizers.   Review of Systems  Constitutional: Negative for fever.  Skin: Positive for rash.  Hematological: Negative for adenopathy.       Objective:   Physical Exam  Constitutional: He appears well-developed and well-nourished.  Cardiovascular: Normal rate and regular rhythm.   Pulmonary/Chest: Effort normal and breath sounds normal. No respiratory distress. He has no wheezes. He has no rales.  Skin: Rash noted.       Patient has nonspecific slightly excoriated dry eczematous rash right upper lateral leg. No pustules. No vesicles.          Assessment & Plan:  Eczematous rash right leg. Triamcinolone 0.1% cream as needed. Avoid scratching if possible

## 2011-06-03 NOTE — Patient Instructions (Signed)
Avoid scratching as much as possible. Consider the use of moisturizers such as Eucerin or Lac-Hydrin especially after bathing

## 2011-06-12 ENCOUNTER — Other Ambulatory Visit: Payer: Self-pay | Admitting: Internal Medicine

## 2011-06-19 ENCOUNTER — Other Ambulatory Visit: Payer: Self-pay | Admitting: Internal Medicine

## 2011-06-28 ENCOUNTER — Ambulatory Visit (INDEPENDENT_AMBULATORY_CARE_PROVIDER_SITE_OTHER): Payer: Medicare Other | Admitting: Internal Medicine

## 2011-06-28 ENCOUNTER — Encounter: Payer: Self-pay | Admitting: Internal Medicine

## 2011-06-28 ENCOUNTER — Other Ambulatory Visit: Payer: Self-pay | Admitting: *Deleted

## 2011-06-28 ENCOUNTER — Other Ambulatory Visit (INDEPENDENT_AMBULATORY_CARE_PROVIDER_SITE_OTHER): Payer: Medicare Other

## 2011-06-28 ENCOUNTER — Telehealth: Payer: Self-pay

## 2011-06-28 VITALS — BP 120/56 | HR 58 | Temp 97.6°F | Ht 68.5 in | Wt 175.5 lb

## 2011-06-28 DIAGNOSIS — Z7901 Long term (current) use of anticoagulants: Secondary | ICD-10-CM

## 2011-06-28 DIAGNOSIS — E119 Type 2 diabetes mellitus without complications: Secondary | ICD-10-CM

## 2011-06-28 DIAGNOSIS — R1310 Dysphagia, unspecified: Secondary | ICD-10-CM | POA: Diagnosis not present

## 2011-06-28 DIAGNOSIS — D649 Anemia, unspecified: Secondary | ICD-10-CM

## 2011-06-28 DIAGNOSIS — C329 Malignant neoplasm of larynx, unspecified: Secondary | ICD-10-CM

## 2011-06-28 DIAGNOSIS — I10 Essential (primary) hypertension: Secondary | ICD-10-CM

## 2011-06-28 DIAGNOSIS — C320 Malignant neoplasm of glottis: Secondary | ICD-10-CM

## 2011-06-28 LAB — CBC WITH DIFFERENTIAL/PLATELET
Basophils Absolute: 0.1 10*3/uL (ref 0.0–0.1)
Basophils Relative: 1.8 % (ref 0.0–3.0)
Eosinophils Absolute: 0.1 10*3/uL (ref 0.0–0.7)
Eosinophils Relative: 2 % (ref 0.0–5.0)
HCT: 29.4 % — ABNORMAL LOW (ref 39.0–52.0)
Hemoglobin: 9.9 g/dL — ABNORMAL LOW (ref 13.0–17.0)
Lymphocytes Relative: 15.8 % (ref 12.0–46.0)
Lymphs Abs: 1.1 10*3/uL (ref 0.7–4.0)
MCHC: 33.7 g/dL (ref 30.0–36.0)
MCV: 97.3 fl (ref 78.0–100.0)
Monocytes Absolute: 0.3 10*3/uL (ref 0.1–1.0)
Monocytes Relative: 4.3 % (ref 3.0–12.0)
Neutro Abs: 5.1 10*3/uL (ref 1.4–7.7)
Neutrophils Relative %: 76.1 % (ref 43.0–77.0)
Platelets: 234 10*3/uL (ref 150.0–400.0)
RBC: 3.02 Mil/uL — ABNORMAL LOW (ref 4.22–5.81)
RDW: 14.6 % (ref 11.5–14.6)
WBC: 6.7 10*3/uL (ref 4.5–10.5)

## 2011-06-28 LAB — BASIC METABOLIC PANEL
CO2: 23 mEq/L (ref 19–32)
Calcium: 9.2 mg/dL (ref 8.4–10.5)
Creatinine, Ser: 2.6 mg/dL — ABNORMAL HIGH (ref 0.4–1.5)
GFR: 24.51 mL/min — ABNORMAL LOW (ref >60.00)
Sodium: 140 mEq/L (ref 135–145)

## 2011-06-28 LAB — PROTIME-INR
INR: 2 ratio — ABNORMAL HIGH (ref 0.8–1.0)
Prothrombin Time: 22.5 s — ABNORMAL HIGH (ref 10.2–12.4)

## 2011-06-28 LAB — LIPID PANEL
HDL: 43.9 mg/dL (ref >39.00)
LDL Cholesterol: 79 mg/dL (ref 0–99)
Total CHOL/HDL Ratio: 4
Triglycerides: 180 mg/dL — ABNORMAL HIGH (ref 0.0–149.0)

## 2011-06-28 NOTE — Assessment & Plan Note (Signed)
stable overall by hx and exam, most recent data reviewed with pt, and pt to continue medical treatment as before Lab Results  Component Value Date   CREATININE 2.6* 06/28/2011

## 2011-06-28 NOTE — Telephone Encounter (Signed)
Put lab order in. 

## 2011-06-28 NOTE — Assessment & Plan Note (Signed)
Also mentions today, exam benign, with hx of vocal cord cancer and no recent f/u will need ENT eval - for referral

## 2011-06-28 NOTE — Progress Notes (Signed)
Subjective:    Patient ID: Nathan Blackwell, male    DOB: 10-25-1920, 78 y.o.   MRN: [id number]  HPI  Here to f/u; overall doing well with excellent attention to meds and compliance, pt even asking if has to take the coumadin exactly at [facility] as he tries to do.  No overt bleeding or bruising.   Pt denies fever, wt loss, night sweats, loss of appetite, or other constitutional symptom  Pt denies chest pain, increased sob or doe, wheezing, orthopnea, PND, increased LE swelling, palpitations, dizziness or syncope, but still has chronic RLE edema 1-2+ no change to at least mid right thigh, though compression stocking knee high seem to be helping.  Pt denies new neurological symptoms such as new headache, or facial or extremity weakness or numbness   Pt denies polydipsia, polyuria. He and wife are considering have a bilat upper lid blepharoplasty as the drooping lids do tend to intrude on the upper aspect of vision bilat, but he is not wanting to urgently consider, prefers to just get by with his current vision, and cont same meds, not wanting to have to do the lovenox bridge.  Denies worsening depressive symptoms, suicidal ideation, or panic. Overall feels good per pt, no significant pain, no recent falls.  Past Medical History  Diagnosis Date  . Diabetes mellitus type II   . Hyperlipidemia   . Hypertension   . Osteoporosis   . Prostatic hypertrophy     benign  . OSA (obstructive sleep apnea)   . Rotator cuff syndrome     chronic  . Elevated PSA   . Hypothyroidism   . Nephrolithiasis   . Thyroid nodule   . GERD (gastroesophageal reflux disease)   . Colitis, Clostridium difficile     presumed 11/08  . Dementia   . Anemia     NOS  . Vitamin B 12 deficiency   . BRADYCARDIA 06/10/2009  . COLON CANCER, HX OF 09/20/2006  . HYPOTHYROIDISM 01/14/2007  . DIABETES MELLITUS, TYPE II 09/20/2006  . HYPERLIPIDEMIA 09/20/2006  . ANEMIA-NOS 06/23/2008  . DEMENTIA 05/14/2007  . DEPRESSION 02/27/2007  . SLEEP  APNEA, OBSTRUCTIVE 01/14/2007  . HYPERTENSION 09/20/2006  . GERD 01/14/2007  . OSTEOPOROSIS 10/08/2006  . PERIPHERAL EDEMA 09/03/2007  . RENAL INSUFFICIENCY 04/01/2010  . B12 deficiency 05/25/2010  . CLOSTRIDIUM DIFFICILE COLITIS 01/30/2007  . THYROID NODULE 01/14/2007  . CONSTIPATION, RECURRENT 01/06/2010  . BENIGN PROSTATIC HYPERTROPHY 10/08/2006  . ARTHRITIS 02/23/2007  . BACK PAIN, THORACIC REGION 06/04/2008  . COLONIC POLYPS, HX OF 10/08/2006  . NEPHROLITHIASIS, HX OF 01/14/2007  . Cancer of colon dx'd 1998  . Cancer of vocal cord dx'd 1998  . 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 DVT of lower limb, acute 01/12/2011   Past Surgical History  Procedure Date  . Cholecystectomy 1999  . Inguinal herniorrhapy 1984  . Appendectomy 1998  . Colon resection 1998    reports that he has never smoked. He does not have any smokeless tobacco history on file. He reports that he does not drink alcohol. His drug history not on file. family history includes Diabetes in his mother and Stroke in his father. Allergies  Allergen Reactions  . Biaxin (Clarithromycin)   . Sulfa Antibiotics    Current Outpatient Prescriptions on File Prior to Visit  Medication Sig Dispense Refill  . amLODipine-olmesartan (AZOR) 10-40 MG per tablet Take 1 tablet by mouth daily.  90 tablet  11  . Cholecalciferol (VITAMIN D-3 PO) Take by mouth  daily.      . citalopram (CELEXA) 20 MG tablet TAKE 1 TABLET BY MOUTH ONCE A DAY  30 tablet  11  . docusate sodium (COLACE) 100 MG capsule Take 100 mg by mouth at bedtime.        . fenofibrate 160 MG tablet TAKE 1 TABLET BY MOUTH ONCE A DAY  30 tablet  11  . ferrous sulfate 325 (65 FE) MG tablet Take 325 mg by mouth daily.      . furosemide (LASIX) 20 MG tablet Take 1 tablet (20 mg total) by mouth daily.  30 tablet  11  . hydrALAZINE (APRESOLINE) 25 MG tablet Take 1 tablet (25 mg total) by mouth 3 (three) times daily.  90 tablet  11  . lansoprazole (PREVACID) 30 MG capsule TAKE 1 CAPSULE  BY MOUTH ONCE A DAY  30 capsule  11  . lipase/protease/amylase (CREON-10/PANCREASE) 12000 UNITS CPEP Take 1 capsule by mouth 3 (three) times daily before meals.        Marland Kitchen OLANZapine (ZYPREXA) 5 MG tablet TAKE 1 TABLET BY MOUTH ONCE A DAY  30 tablet  9  . sitaGLIPtin (JANUVIA) 50 MG tablet Take 50 mg by mouth daily.        Marland Kitchen SYNTHROID 100 MCG tablet TAKE 1 TABLET BY MOUTH ONCE A DAY  30 tablet  11  . Tamsulosin HCl (FLOMAX) 0.4 MG CAPS TAKE 1 CAPSULE BY MOUTH ONCE A DAY AT BEDTIME  30 capsule  11  . triamcinolone cream (KENALOG) 0.1 % Apply topically 2 (two) times daily.  30 g  0  . vitamin B-12 (CYANOCOBALAMIN) 100 MCG tablet Take 50 mcg by mouth daily.      Marland Kitchen warfarin (COUMADIN) 5 MG tablet 2.5 mg on T,TH,Sat,Sun and 5 mg on M,W,F   30 tablet  11  Review of Systems Review of Systems  Constitutional: Negative for diaphoresis and unexpected weight change.  Respiratory: Negative for choking and stridor.   Gastrointestinal: Negative for vomiting and blood in stool.  Genitourinary: Negative for hematuria and decreased urine volume.  Musculoskeletal: Negative for gait problem.  Skin: Negative for color change and wound.  Neurological: Negative for worsening tremors and numbness.  Psychiatric/Behavioral: Negative for decreased concentration. The patient is not hyperactive.    Objective:   Physical Exam BP 120/56  Pulse 58  Temp(Src) 97.6 F (36.4 C) (Oral)  Ht 5' 8.5" (1.74 m)  Wt 175 lb 8 oz (79.606 kg)  BMI 26.30 kg/m2  SpO2 97% Physical Exam  VS noted Constitutional: Pt appears well-developed and well-nourished.  HENT: Head: Normocephalic.  Right Ear: External ear normal.  Left Ear: External ear normal.  Eyes: Conjunctivae and EOM are normal. Pupils are equal, round, and reactive to light. but does have bilat mild drooping upper lids that affect vision small degree Neck: Normal range of motion. Neck supple.  Cardiovascular: Normal rate and regular rhythm.   Pulmonary/Chest: Effort  normal and breath sounds normal.  - no rales or wheezing Neurological: Pt is alert.  Skin: Skin is warm. No erythema.  Psychiatric: Pt behavior is normal. Thought content normal. not depressed or nervous today    Assessment & Plan:

## 2011-06-28 NOTE — Assessment & Plan Note (Signed)
stable overall by hx and exam, most recent data reviewed with pt, and pt to continue medical treatment as before Lab Results  Component Value Date   HGBA1C 6.2 06/28/2011    

## 2011-06-28 NOTE — Assessment & Plan Note (Signed)
stable overall by hx and exam, most recent data reviewed with pt, and pt to continue medical treatment as before Lab Results  Component Value Date   INR 2.0* 06/28/2011   INR 2.9* 05/16/2011   INR 2.1* 04/11/2011

## 2011-06-28 NOTE — Patient Instructions (Signed)
You will be contacted regarding the referral for: ENT - Dr Narda Bonds Continue all other medications as before Your blood tests will be looked at later today You will be contacted by phone if any changes need to be made immediately.  Otherwise, you will receive a letter about your results with an explanation. Please return in 6 weeks with Lab testing done that day

## 2011-06-28 NOTE — Assessment & Plan Note (Signed)
stable overall by hx and exam, most recent data reviewed with pt, and pt to continue medical treatment as before  BP Readings from Last 3 Encounters:  06/28/11 120/56  06/03/11 110/60  05/16/11 120/62

## 2011-06-29 DIAGNOSIS — R490 Dysphonia: Secondary | ICD-10-CM | POA: Diagnosis not present

## 2011-06-29 DIAGNOSIS — J029 Acute pharyngitis, unspecified: Secondary | ICD-10-CM | POA: Diagnosis not present

## 2011-06-30 DIAGNOSIS — L608 Other nail disorders: Secondary | ICD-10-CM | POA: Diagnosis not present

## 2011-06-30 DIAGNOSIS — E119 Type 2 diabetes mellitus without complications: Secondary | ICD-10-CM | POA: Diagnosis not present

## 2011-07-13 ENCOUNTER — Other Ambulatory Visit: Payer: Self-pay | Admitting: Dermatology

## 2011-07-13 DIAGNOSIS — Z85828 Personal history of other malignant neoplasm of skin: Secondary | ICD-10-CM | POA: Diagnosis not present

## 2011-07-13 DIAGNOSIS — L570 Actinic keratosis: Secondary | ICD-10-CM | POA: Diagnosis not present

## 2011-07-13 DIAGNOSIS — L219 Seborrheic dermatitis, unspecified: Secondary | ICD-10-CM | POA: Diagnosis not present

## 2011-07-13 DIAGNOSIS — D485 Neoplasm of uncertain behavior of skin: Secondary | ICD-10-CM | POA: Diagnosis not present

## 2011-07-13 DIAGNOSIS — I872 Venous insufficiency (chronic) (peripheral): Secondary | ICD-10-CM | POA: Diagnosis not present

## 2011-07-26 DIAGNOSIS — D649 Anemia, unspecified: Secondary | ICD-10-CM | POA: Diagnosis not present

## 2011-07-26 DIAGNOSIS — R5381 Other malaise: Secondary | ICD-10-CM | POA: Diagnosis not present

## 2011-07-26 DIAGNOSIS — N184 Chronic kidney disease, stage 4 (severe): Secondary | ICD-10-CM | POA: Diagnosis not present

## 2011-07-29 ENCOUNTER — Other Ambulatory Visit: Payer: Self-pay | Admitting: Internal Medicine

## 2011-08-03 DIAGNOSIS — D649 Anemia, unspecified: Secondary | ICD-10-CM | POA: Diagnosis not present

## 2011-08-03 DIAGNOSIS — I129 Hypertensive chronic kidney disease with stage 1 through stage 4 chronic kidney disease, or unspecified chronic kidney disease: Secondary | ICD-10-CM | POA: Diagnosis not present

## 2011-08-03 DIAGNOSIS — N184 Chronic kidney disease, stage 4 (severe): Secondary | ICD-10-CM | POA: Diagnosis not present

## 2011-08-03 DIAGNOSIS — N2581 Secondary hyperparathyroidism of renal origin: Secondary | ICD-10-CM | POA: Diagnosis not present

## 2011-08-08 ENCOUNTER — Encounter: Payer: Self-pay | Admitting: Internal Medicine

## 2011-08-08 ENCOUNTER — Other Ambulatory Visit (INDEPENDENT_AMBULATORY_CARE_PROVIDER_SITE_OTHER): Payer: Medicare Other

## 2011-08-08 ENCOUNTER — Ambulatory Visit (INDEPENDENT_AMBULATORY_CARE_PROVIDER_SITE_OTHER): Payer: Medicare Other | Admitting: Internal Medicine

## 2011-08-08 VITALS — BP 128/58 | HR 60 | Temp 97.5°F | Ht 68.5 in | Wt 180.0 lb

## 2011-08-08 DIAGNOSIS — I10 Essential (primary) hypertension: Secondary | ICD-10-CM | POA: Diagnosis not present

## 2011-08-08 DIAGNOSIS — Z7901 Long term (current) use of anticoagulants: Secondary | ICD-10-CM

## 2011-08-08 DIAGNOSIS — E119 Type 2 diabetes mellitus without complications: Secondary | ICD-10-CM | POA: Diagnosis not present

## 2011-08-08 LAB — BASIC METABOLIC PANEL
BUN: 39 mg/dL — ABNORMAL HIGH (ref 6–23)
Chloride: 111 mEq/L (ref 96–112)
GFR: 22.15 mL/min — ABNORMAL LOW (ref >60.00)
Potassium: 4.3 mEq/L (ref 3.5–5.1)
Sodium: 145 mEq/L (ref 135–145)

## 2011-08-08 LAB — PROTIME-INR: Prothrombin Time: 31.5 s — ABNORMAL HIGH (ref 10.2–12.4)

## 2011-08-08 NOTE — Patient Instructions (Signed)
No changes in medication today Your blood tests are still pending You will be contacted by phone if any changes need to be made immediately.  Otherwise, you will receive a letter about your results with an explanation. Please return in 6 wks

## 2011-08-10 DIAGNOSIS — R112 Nausea with vomiting, unspecified: Secondary | ICD-10-CM | POA: Diagnosis not present

## 2011-08-10 DIAGNOSIS — I498 Other specified cardiac arrhythmias: Secondary | ICD-10-CM | POA: Diagnosis not present

## 2011-08-10 DIAGNOSIS — Z85038 Personal history of other malignant neoplasm of large intestine: Secondary | ICD-10-CM | POA: Diagnosis not present

## 2011-08-10 DIAGNOSIS — R609 Edema, unspecified: Secondary | ICD-10-CM | POA: Diagnosis not present

## 2011-08-10 DIAGNOSIS — E119 Type 2 diabetes mellitus without complications: Secondary | ICD-10-CM | POA: Diagnosis not present

## 2011-08-10 DIAGNOSIS — R079 Chest pain, unspecified: Secondary | ICD-10-CM | POA: Diagnosis not present

## 2011-08-10 DIAGNOSIS — R109 Unspecified abdominal pain: Secondary | ICD-10-CM | POA: Diagnosis not present

## 2011-08-10 DIAGNOSIS — Z86718 Personal history of other venous thrombosis and embolism: Secondary | ICD-10-CM | POA: Diagnosis not present

## 2011-08-10 DIAGNOSIS — K449 Diaphragmatic hernia without obstruction or gangrene: Secondary | ICD-10-CM | POA: Diagnosis not present

## 2011-08-10 DIAGNOSIS — R61 Generalized hyperhidrosis: Secondary | ICD-10-CM | POA: Diagnosis not present

## 2011-08-10 DIAGNOSIS — R1110 Vomiting, unspecified: Secondary | ICD-10-CM | POA: Diagnosis not present

## 2011-08-10 DIAGNOSIS — K219 Gastro-esophageal reflux disease without esophagitis: Secondary | ICD-10-CM | POA: Diagnosis not present

## 2011-08-11 DIAGNOSIS — R109 Unspecified abdominal pain: Secondary | ICD-10-CM | POA: Diagnosis not present

## 2011-08-11 DIAGNOSIS — R1110 Vomiting, unspecified: Secondary | ICD-10-CM | POA: Diagnosis not present

## 2011-08-13 ENCOUNTER — Encounter: Payer: Self-pay | Admitting: Internal Medicine

## 2011-08-13 NOTE — Assessment & Plan Note (Signed)
stable overall by hx and exam, most recent data reviewed with pt, and pt to continue medical treatment as before Lab Results  Component Value Date   INR 2.8* 08/08/2011   INR 2.0* 06/28/2011   INR 2.9* 05/16/2011

## 2011-08-13 NOTE — Progress Notes (Signed)
Subjective:    Patient ID: Nathan Blackwell, male    DOB: 1920/05/07, 78 y.o.   MRN: [id number]  HPI  Here to f/u;  Overall doing well;  Pt denies chest pain, increased sob or doe, wheezing, orthopnea, PND, increased LE swelling, palpitations, dizziness or syncope. RLE edema no change.   No current pain concerns, no recent falls.  Denies worsening depressive symptoms, suicidal ideation, or panic.  No overt bleeding or bruising.   Pt denies polydipsia, polyuria, or low sugar symptoms such as weakness or confusion improved with po intake.  Pt states overall good compliance with meds, trying to follow lower cholesterol, diabetic diet, wt overall stable.   Pt denies fever, wt loss, night sweats, loss of appetite, or other constitutional symptoms Past Medical History  Diagnosis Date  . Diabetes mellitus type II   . Hyperlipidemia   . Hypertension   . Osteoporosis   . Prostatic hypertrophy     benign  . OSA (obstructive sleep apnea)   . Rotator cuff syndrome     chronic  . Elevated PSA   . Hypothyroidism   . Nephrolithiasis   . Thyroid nodule   . GERD (gastroesophageal reflux disease)   . Colitis, Clostridium difficile     presumed 11/08  . Dementia   . Anemia     NOS  . Vitamin B 12 deficiency   . BRADYCARDIA 06/10/2009  . COLON CANCER, HX OF 09/20/2006  . HYPOTHYROIDISM 01/14/2007  . DIABETES MELLITUS, TYPE II 09/20/2006  . HYPERLIPIDEMIA 09/20/2006  . ANEMIA-NOS 06/23/2008  . DEMENTIA 05/14/2007  . DEPRESSION 02/27/2007  . SLEEP APNEA, OBSTRUCTIVE 01/14/2007  . HYPERTENSION 09/20/2006  . GERD 01/14/2007  . OSTEOPOROSIS 10/08/2006  . PERIPHERAL EDEMA 09/03/2007  . RENAL INSUFFICIENCY 04/01/2010  . B12 deficiency 05/25/2010  . CLOSTRIDIUM DIFFICILE COLITIS 01/30/2007  . THYROID NODULE 01/14/2007  . CONSTIPATION, RECURRENT 01/06/2010  . BENIGN PROSTATIC HYPERTROPHY 10/08/2006  . ARTHRITIS 02/23/2007  . BACK PAIN, THORACIC REGION 06/04/2008  . COLONIC POLYPS, HX OF 10/08/2006  . NEPHROLITHIASIS,  HX OF 01/14/2007  . Cancer of colon dx'd 1998  . Cancer of vocal cord dx'd 1998  . 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 DVT of lower limb, acute 01/12/2011   Past Surgical History  Procedure Date  . Cholecystectomy 1999  . Inguinal herniorrhapy 1984  . Appendectomy 1998  . Colon resection 1998    reports that he has never smoked. He does not have any smokeless tobacco history on file. He reports that he does not drink alcohol. His drug history not on file. family history includes Diabetes in his mother and Stroke in his father. Allergies  Allergen Reactions  . Biaxin (Clarithromycin)   . Sulfa Antibiotics    Current Outpatient Prescriptions on File Prior to Visit  Medication Sig Dispense Refill  . amLODipine-olmesartan (AZOR) 10-40 MG per tablet Take 1 tablet by mouth daily.  90 tablet  11  . Cholecalciferol (VITAMIN D-3 PO) Take by mouth daily.      . citalopram (CELEXA) 20 MG tablet TAKE 1 TABLET BY MOUTH ONCE A DAY  30 tablet  11  . docusate sodium (COLACE) 100 MG capsule Take 100 mg by mouth at bedtime.        . fenofibrate 160 MG tablet TAKE 1 TABLET BY MOUTH ONCE A DAY  30 tablet  11  . ferrous sulfate 325 (65 FE) MG tablet Take 325 mg by mouth daily.      . furosemide (LASIX)  20 MG tablet Take 1 tablet (20 mg total) by mouth daily.  30 tablet  11  . hydrALAZINE (APRESOLINE) 25 MG tablet TAKE 1 TABLET BY MOUTH 3 TIMES DAILY  90 tablet  11  . JANUVIA 50 MG tablet TAKE 1 TABLET BY MOUTH DAILY  30 tablet  11  . lansoprazole (PREVACID) 30 MG capsule TAKE 1 CAPSULE BY MOUTH ONCE A DAY  30 capsule  11  . lipase/protease/amylase (CREON-10/PANCREASE) 12000 UNITS CPEP Take 1 capsule by mouth 3 (three) times daily before meals.        Marland Kitchen OLANZapine (ZYPREXA) 5 MG tablet TAKE 1 TABLET BY MOUTH ONCE A DAY  30 tablet  9  . SYNTHROID 100 MCG tablet TAKE 1 TABLET BY MOUTH ONCE A DAY  30 tablet  11  . Tamsulosin HCl (FLOMAX) 0.4 MG CAPS TAKE 1 CAPSULE BY MOUTH ONCE A DAY AT BEDTIME  30  capsule  11  . triamcinolone cream (KENALOG) 0.1 % Apply topically 2 (two) times daily.  30 g  0  . vitamin B-12 (CYANOCOBALAMIN) 100 MCG tablet Take 50 mcg by mouth daily.      Marland Kitchen warfarin (COUMADIN) 5 MG tablet 2.5 mg on T,TH,Sat,Sun and 5 mg on M,W,F   30 tablet  11   Review of Systems Review of Systems  Constitutional: Negative for diaphoresis and unexpected weight change.  HENT: Negative for drooling and tinnitus.   Eyes: Negative for photophobia and visual disturbance.  Respiratory: Negative for choking and stridor.   Gastrointestinal: Negative for vomiting and blood in stool.  Genitourinary: Negative for hematuria and decreased urine volume MSK: no joint pain or swelling Skin: Negative for color change and wound.  Neurological: Negative for tremors and numbness.     Objective:   Physical Exam BP 128/58  Pulse 60  Temp(Src) 97.5 F (36.4 C) (Oral)  Ht 5' 8.5" (1.74 m)  Wt 180 lb (81.647 kg)  BMI 26.97 kg/m2  SpO2 98% Physical Exam  VS noted Constitutional: Pt appears well-developed and well-nourished.  HENT: Head: Normocephalic.  Right Ear: External ear normal.  Left Ear: External ear normal.  Eyes: Conjunctivae and EOM are normal. Pupils are equal, round, and reactive to light.  Neck: Normal range of motion. Neck supple.  Cardiovascular: Normal rate and regular rhythm.   Pulmonary/Chest: Effort normal and breath sounds normal.  Neurological: Pt is alert. No cranial nerve deficit.  Skin: Skin is warm. No erythema. chronic RLE edema 1+ to knee only, no acute bruising or swelling Psychiatric: Pt behavior is normal. Not depressed affect    Assessment & Plan:

## 2011-08-13 NOTE — Assessment & Plan Note (Signed)
stable overall by hx and exam, most recent data reviewed with pt, and pt to continue medical treatment as before Lab Results  Component Value Date   HGBA1C 6.2 06/28/2011

## 2011-08-13 NOTE — Assessment & Plan Note (Signed)
stable overall by hx and exam, most recent data reviewed with pt, and pt to continue medical treatment as before BP Readings from Last 3 Encounters:  08/08/11 128/58  06/28/11 120/56  06/03/11 110/60

## 2011-08-30 DIAGNOSIS — I872 Venous insufficiency (chronic) (peripheral): Secondary | ICD-10-CM | POA: Diagnosis not present

## 2011-09-12 DIAGNOSIS — E119 Type 2 diabetes mellitus without complications: Secondary | ICD-10-CM | POA: Diagnosis not present

## 2011-09-12 DIAGNOSIS — L608 Other nail disorders: Secondary | ICD-10-CM | POA: Diagnosis not present

## 2011-09-16 ENCOUNTER — Other Ambulatory Visit: Payer: Self-pay | Admitting: Internal Medicine

## 2011-09-20 ENCOUNTER — Ambulatory Visit: Payer: Medicare Other | Admitting: Pulmonary Disease

## 2011-09-21 ENCOUNTER — Telehealth: Payer: Self-pay

## 2011-09-21 ENCOUNTER — Ambulatory Visit (INDEPENDENT_AMBULATORY_CARE_PROVIDER_SITE_OTHER): Payer: Medicare Other | Admitting: Internal Medicine

## 2011-09-21 ENCOUNTER — Other Ambulatory Visit (INDEPENDENT_AMBULATORY_CARE_PROVIDER_SITE_OTHER): Payer: Medicare Other

## 2011-09-21 ENCOUNTER — Encounter: Payer: Self-pay | Admitting: Internal Medicine

## 2011-09-21 VITALS — BP 120/58 | HR 67 | Temp 98.0°F | Ht 69.0 in | Wt 176.0 lb

## 2011-09-21 DIAGNOSIS — I10 Essential (primary) hypertension: Secondary | ICD-10-CM | POA: Diagnosis not present

## 2011-09-21 DIAGNOSIS — Z7901 Long term (current) use of anticoagulants: Secondary | ICD-10-CM | POA: Diagnosis not present

## 2011-09-21 DIAGNOSIS — R609 Edema, unspecified: Secondary | ICD-10-CM

## 2011-09-21 DIAGNOSIS — F068 Other specified mental disorders due to known physiological condition: Secondary | ICD-10-CM

## 2011-09-21 LAB — PROTIME-INR: INR: 2.5 ratio — ABNORMAL HIGH (ref 0.8–1.0)

## 2011-09-21 LAB — LIPID PANEL
LDL Cholesterol: 92 mg/dL (ref 0–99)
VLDL: 37.8 mg/dL (ref 0.0–40.0)

## 2011-09-21 LAB — BASIC METABOLIC PANEL
BUN: 41 mg/dL — ABNORMAL HIGH (ref 6–23)
Chloride: 106 mEq/L (ref 96–112)
Glucose, Bld: 207 mg/dL — ABNORMAL HIGH (ref 70–99)
Potassium: 4.7 mEq/L (ref 3.5–5.1)

## 2011-09-21 LAB — CBC WITH DIFFERENTIAL/PLATELET
Basophils Relative: 1.5 % (ref 0.0–3.0)
Eosinophils Relative: 2.8 % (ref 0.0–5.0)
Lymphocytes Relative: 17.8 % (ref 12.0–46.0)
Neutrophils Relative %: 74.1 % (ref 43.0–77.0)
RBC: 3.07 Mil/uL — ABNORMAL LOW (ref 4.22–5.81)
WBC: 5.7 10*3/uL (ref 4.5–10.5)

## 2011-09-21 MED ORDER — FUROSEMIDE 20 MG PO TABS: ORAL_TABLET | ORAL | Status: DC

## 2011-09-21 NOTE — Telephone Encounter (Signed)
Put lab order in. 

## 2011-09-21 NOTE — Patient Instructions (Addendum)
Please increase the lasix to 20 mg twice per day, with taking the Second dose ONLY if you have swelling to the Left leg Please weight yourself in the AM tomorrow - your "goal weight" I think should be about 2 lbs less than the number you get at home tomorrow morning Take only the Lasix 20 mg in the AM ONLY if the weight is down at the goal Continue all other medications as before You will be contacted by phone if any changes need to be made immediately based on your lab results.  Otherwise, you will receive a letter about your results with an explanation. Please keep your appointments with your specialists as you have planned Please return in 6 weeks, or sooner if needed

## 2011-09-22 ENCOUNTER — Ambulatory Visit (INDEPENDENT_AMBULATORY_CARE_PROVIDER_SITE_OTHER): Payer: Medicare Other | Admitting: Pulmonary Disease

## 2011-09-22 ENCOUNTER — Encounter: Payer: Self-pay | Admitting: Pulmonary Disease

## 2011-09-22 VITALS — BP 102/60 | HR 68 | Temp 97.8°F | Ht 69.0 in | Wt 175.6 lb

## 2011-09-22 DIAGNOSIS — G4733 Obstructive sleep apnea (adult) (pediatric): Secondary | ICD-10-CM

## 2011-09-22 NOTE — Assessment & Plan Note (Signed)
The patient is wearing CPAP compliantly, and is benefiting from therapy.  His only issue today is a poor mask fit that is resulting in pressure sores on the bridge of his nose.  I have recommended that he try a different fullface mask, and there have recently been a few new ones to come on the market.  I have instructed him to call his DME company.

## 2011-09-22 NOTE — Patient Instructions (Addendum)
Would try different full face mask that may fit better.  Consider resmed quattro air.   followup with me in one year if doing well.

## 2011-09-22 NOTE — Progress Notes (Signed)
  Subjective:    Patient ID: Nathan Blackwell, male    DOB: 21-Dec-1920, 78 y.o.   MRN: [id number]  HPI The patient comes in today for followup of his known obstructive sleep apnea.  He is wearing his CPAP consistently, and feels that it helps his sleep and daytime alertness.  He is having no issues with his pressure, but is having issues with skin breakdown on the bridge of the nose from his mask.   Review of Systems  Constitutional: Negative for fever and unexpected weight change.  HENT: Negative for ear pain, nosebleeds, congestion, sore throat, rhinorrhea, sneezing, trouble swallowing, dental problem, postnasal drip and sinus pressure.   Eyes: Negative for redness and itching.  Respiratory: Negative for cough, chest tightness, shortness of breath and wheezing.   Cardiovascular: Negative for palpitations and leg swelling.  Gastrointestinal: Negative for nausea and vomiting.  Genitourinary: Negative for dysuria.  Musculoskeletal: Negative for joint swelling.  Skin: Negative for rash.  Neurological: Negative for headaches.  Hematological: Does not bruise/bleed easily.  Psychiatric/Behavioral: Negative for dysphoric mood. The patient is not nervous/anxious.   All other systems reviewed and are negative.       Objective:   Physical Exam Well-developed male in no acute distress Nose without purulence or discharge noted.  Mild excoriation over the bridge of the nose, but no obvious skin breakdown from the CPAP mask Lower extremities without significant edema, no cyanosis Alert, does not appear to be sleepy, moves all 4 extremities.       Assessment & Plan:

## 2011-09-23 ENCOUNTER — Encounter: Payer: Self-pay | Admitting: Internal Medicine

## 2011-09-23 NOTE — Assessment & Plan Note (Signed)
stable overall by hx and exam, most recent data reviewed with pt, and pt to continue medical treatment as before BP Readings from Last 3 Encounters:  09/22/11 102/60  09/21/11 120/58  08/08/11 128/58

## 2011-09-23 NOTE — Assessment & Plan Note (Signed)
stable overall by hx and exam, most recent data reviewed with pt, and pt to continue medical treatment as before Lab Results  Component Value Date   INR 2.5* 09/21/2011   INR 2.8* 08/08/2011   INR 2.0* 06/28/2011

## 2011-09-23 NOTE — Assessment & Plan Note (Signed)
stable overall by hx and exam, most recent data reviewed with pt, and pt to continue medical treatment as before Lab Results  Component Value Date   WBC 5.7 09/21/2011   HGB 9.9* 09/21/2011   HCT 30.2* 09/21/2011   PLT 240.0 09/21/2011   GLUCOSE 207* 09/21/2011   CHOL 166 09/21/2011   TRIG 189.0* 09/21/2011   HDL 36.50* 09/21/2011   LDLDIRECT 92.1 01/16/2011   LDLCALC 92 09/21/2011   ALT 11 01/03/2011   AST 17 01/03/2011   NA 138 09/21/2011   K 4.7 09/21/2011   CL 106 09/21/2011   CREATININE 2.9* 09/21/2011   BUN 41* 09/21/2011   CO2 22 09/21/2011   TSH 2.14 01/16/2011   PSA 4.88* 01/14/2007   INR 2.5* 09/21/2011   HGBA1C 6.5 09/21/2011   MICROALBUR 29.7* 05/18/2009

## 2011-09-23 NOTE — Progress Notes (Signed)
Subjective:    Patient ID: Nathan Blackwell, male    DOB: 07-15-1920, 78 y.o.   MRN: [id number]  HPI  Here to f/u;  Pt denies chest pain, increased sob or doe, wheezing, orthopnea, PND, palpitations, dizziness or syncope, except for mild worsening edema with wt gain to the LE's, right > left on top of mild chronic RLE post DVT swelling.  Pt denies new neurological symptoms such as new headache, or facial or extremity weakness or numbness  Dementia overall stable symptomatically with gradual worsening at best, and not assoc with behavioral changes such as hallucinations, paranoia, or agitation.   Pt denies polydipsia, polyuria.  No overt bleeding or bruising Past Medical History  Diagnosis Date  . Diabetes mellitus type II   . Hyperlipidemia   . Hypertension   . Osteoporosis   . Prostatic hypertrophy     benign  . OSA (obstructive sleep apnea)   . Rotator cuff syndrome     chronic  . Elevated PSA   . Hypothyroidism   . Nephrolithiasis   . Thyroid nodule   . GERD (gastroesophageal reflux disease)   . Colitis, Clostridium difficile     presumed 11/08  . Dementia   . Anemia     NOS  . Vitamin B 12 deficiency   . BRADYCARDIA 06/10/2009  . COLON CANCER, HX OF 09/20/2006  . HYPOTHYROIDISM 01/14/2007  . DIABETES MELLITUS, TYPE II 09/20/2006  . HYPERLIPIDEMIA 09/20/2006  . ANEMIA-NOS 06/23/2008  . DEMENTIA 05/14/2007  . DEPRESSION 02/27/2007  . SLEEP APNEA, OBSTRUCTIVE 01/14/2007  . HYPERTENSION 09/20/2006  . GERD 01/14/2007  . OSTEOPOROSIS 10/08/2006  . PERIPHERAL EDEMA 09/03/2007  . RENAL INSUFFICIENCY 04/01/2010  . B12 deficiency 05/25/2010  . CLOSTRIDIUM DIFFICILE COLITIS 01/30/2007  . THYROID NODULE 01/14/2007  . CONSTIPATION, RECURRENT 01/06/2010  . BENIGN PROSTATIC HYPERTROPHY 10/08/2006  . ARTHRITIS 02/23/2007  . BACK PAIN, THORACIC REGION 06/04/2008  . COLONIC POLYPS, HX OF 10/08/2006  . NEPHROLITHIASIS, HX OF 01/14/2007  . Cancer of colon dx'd 1998  . Cancer of vocal cord dx'd 1998  .  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 DVT of lower limb, acute 01/12/2011   Past Surgical History  Procedure Date  . Cholecystectomy 1999  . Inguinal herniorrhapy 1984  . Appendectomy 1998  . Colon resection 1998    reports that he has never smoked. He has never used smokeless tobacco. He reports that he does not drink alcohol. His drug history not on file. family history includes Diabetes in his mother and Stroke in his father. Allergies  Allergen Reactions  . Biaxin (Clarithromycin)   . Sulfa Antibiotics    Current Outpatient Prescriptions on File Prior to Visit  Medication Sig Dispense Refill  . amLODipine-olmesartan (AZOR) 10-40 MG per tablet Take 1 tablet by mouth daily.  90 tablet  11  . buPROPion (WELLBUTRIN XL) 150 MG 24 hr tablet TAKE 1 TABLET BY MOUTH EVERY DAY  30 tablet  11  . Cholecalciferol (VITAMIN D-3 PO) Take by mouth daily.      . citalopram (CELEXA) 20 MG tablet TAKE 1 TABLET BY MOUTH ONCE A DAY  30 tablet  11  . docusate sodium (COLACE) 100 MG capsule Take 100 mg by mouth at bedtime.        . fenofibrate 160 MG tablet TAKE 1 TABLET BY MOUTH ONCE A DAY  30 tablet  11  . ferrous sulfate 325 (65 FE) MG tablet Take 325 mg by mouth daily.      Marland Kitchen  furosemide (LASIX) 20 MG tablet 1 tab by mouth twice per day, with the second dose only for persistent left leg swelling  60 tablet  11  . hydrALAZINE (APRESOLINE) 25 MG tablet TAKE 1 TABLET BY MOUTH 3 TIMES DAILY  90 tablet  11  . JANUVIA 50 MG tablet TAKE 1 TABLET BY MOUTH DAILY  30 tablet  11  . lansoprazole (PREVACID) 30 MG capsule TAKE 1 CAPSULE BY MOUTH ONCE A DAY  30 capsule  11  . lipase/protease/amylase (CREON-10/PANCREASE) 12000 UNITS CPEP Take 1 capsule by mouth 3 (three) times daily before meals.        Marland Kitchen OLANZapine (ZYPREXA) 5 MG tablet TAKE 1 TABLET BY MOUTH ONCE A DAY  30 tablet  9  . SYNTHROID 100 MCG tablet TAKE 1 TABLET BY MOUTH ONCE A DAY  30 tablet  11  . Tamsulosin HCl (FLOMAX) 0.4 MG CAPS TAKE 1 CAPSULE BY  MOUTH ONCE A DAY AT BEDTIME  30 capsule  11  . triamcinolone cream (KENALOG) 0.1 % Apply topically 2 (two) times daily.  30 g  0  . vitamin B-12 (CYANOCOBALAMIN) 100 MCG tablet Take 50 mcg by mouth daily.      Marland Kitchen warfarin (COUMADIN) 5 MG tablet 2.5 mg on T,TH,Sat,Sun and 5 mg on M,W,F   30 tablet  11    Review of Systems Review of Systems  Constitutional: Negative for diaphoresis and unexpected weight change.  HENT: Negative for drooling and tinnitus.   Eyes: Negative for photophobia and visual disturbance.  Respiratory: Negative for cough and sob Gastrointestinal: Negative for vomiting and blood in stool.  Genitourinary: Negative for hematuria and decreased urine volume.  Musculoskeletal: Negative for joint swelling Skin: Negative for color change and wound.  Neurological: Negative for tremors and numbness.  Psychiatric/Behavioral: Negative for decreased concentration. The patient is not hyperactive.      Objective:   Physical Exam BP 120/58  Pulse 67  Temp 98 F (36.7 C) (Oral)  Ht 5\' 9"  (1.753 m)  Wt 176 lb (79.833 kg)  BMI 25.99 kg/m2  SpO2 97% Physical Exam  VS noted, not ill appearing Constitutional: Pt appears well-developed and well-nourished.  HENT: Head: Normocephalic.  Right Ear: External ear normal.  Left Ear: External ear normal.  Eyes: Conjunctivae and EOM are normal. Pupils are equal, round, and reactive to light.  Neck: Normal range of motion. Neck supple.  Cardiovascular: Normal rate and regular rhythm.   Pulmonary/Chest: Effort normal and breath sounds normal.  Neurological: Pt is alert. Not confused Skin: Skin is warm. No erythema. Bilat extrem with 1-2+ edema right > left to above knees Psychiatric: Pt behavior is normal, not depressed affect    Assessment & Plan:

## 2011-09-23 NOTE — Assessment & Plan Note (Signed)
For sliding scale diuretic - lasix 20 bid with second dose only for wt gain,  to f/u any worsening symptoms or concerns

## 2011-10-14 ENCOUNTER — Other Ambulatory Visit: Payer: Self-pay | Admitting: Gastroenterology

## 2011-10-14 ENCOUNTER — Other Ambulatory Visit: Payer: Self-pay | Admitting: Internal Medicine

## 2011-10-30 DIAGNOSIS — N2581 Secondary hyperparathyroidism of renal origin: Secondary | ICD-10-CM | POA: Diagnosis not present

## 2011-10-30 DIAGNOSIS — D649 Anemia, unspecified: Secondary | ICD-10-CM | POA: Diagnosis not present

## 2011-10-30 DIAGNOSIS — N184 Chronic kidney disease, stage 4 (severe): Secondary | ICD-10-CM | POA: Diagnosis not present

## 2011-11-01 DIAGNOSIS — L570 Actinic keratosis: Secondary | ICD-10-CM | POA: Diagnosis not present

## 2011-11-01 DIAGNOSIS — I872 Venous insufficiency (chronic) (peripheral): Secondary | ICD-10-CM | POA: Diagnosis not present

## 2011-11-01 DIAGNOSIS — L565 Disseminated superficial actinic porokeratosis (DSAP): Secondary | ICD-10-CM | POA: Diagnosis not present

## 2011-11-02 ENCOUNTER — Ambulatory Visit (INDEPENDENT_AMBULATORY_CARE_PROVIDER_SITE_OTHER): Payer: Medicare Other | Admitting: Internal Medicine

## 2011-11-02 ENCOUNTER — Encounter: Payer: Self-pay | Admitting: Internal Medicine

## 2011-11-02 ENCOUNTER — Ambulatory Visit: Payer: Medicare Other

## 2011-11-02 VITALS — BP 102/58 | HR 62 | Temp 97.4°F | Ht 68.5 in | Wt 173.2 lb

## 2011-11-02 DIAGNOSIS — F329 Major depressive disorder, single episode, unspecified: Secondary | ICD-10-CM | POA: Diagnosis not present

## 2011-11-02 DIAGNOSIS — I10 Essential (primary) hypertension: Secondary | ICD-10-CM

## 2011-11-02 DIAGNOSIS — Z7901 Long term (current) use of anticoagulants: Secondary | ICD-10-CM | POA: Diagnosis not present

## 2011-11-02 DIAGNOSIS — E119 Type 2 diabetes mellitus without complications: Secondary | ICD-10-CM

## 2011-11-02 DIAGNOSIS — R609 Edema, unspecified: Secondary | ICD-10-CM

## 2011-11-02 LAB — BASIC METABOLIC PANEL
BUN: 49 mg/dL — ABNORMAL HIGH (ref 6–23)
CO2: 26 mEq/L (ref 19–32)
Calcium: 8.9 mg/dL (ref 8.4–10.5)
Chloride: 104 mEq/L (ref 96–112)
Creatinine, Ser: 3.1 mg/dL — ABNORMAL HIGH (ref 0.4–1.5)

## 2011-11-02 NOTE — Patient Instructions (Addendum)
Please increase the coumadin to 2.5 mg (half pill) every day except for a 5 mg pill on Tuesday and Thursdays Please return for a blood test only in 2 weeks - the INR only (for the coumadin) Continue all other medications as before Please keep your appointments with your specialists as you have planned - renal next Tuesday Please return in 3 months, or sooner if needed

## 2011-11-03 ENCOUNTER — Telehealth: Payer: Self-pay | Admitting: Internal Medicine

## 2011-11-03 NOTE — Telephone Encounter (Signed)
Message copied by Etheleen Sia on Fri Nov 03, 2011 10:55 AM ------      Message from: Etheleen Sia      Created: Fri Nov 03, 2011  9:41 AM      Regarding: FW: LABS       Wife is aware.      ----- Message -----         From: Corwin Levins, MD         Sent: 11/02/2011   3:01 PM           To: Etheleen Sia      Subject: RE: LABS                                                 No, ok to hold off for now, as by then he will be at the coumadin clinic, and is seeing renal as well on a regular basis with labs there      ----- Message -----         From: Etheleen Sia         Sent: 11/02/2011   2:56 PM           To: Corwin Levins, MD      Subject: LABS                                                     Taray Normoyle wants to know if he needs labs before his 3 mo appt  Nov. 25.  He wants to do them that morning before the appt in the afternoon.

## 2011-11-04 ENCOUNTER — Encounter: Payer: Self-pay | Admitting: Internal Medicine

## 2011-11-04 NOTE — Assessment & Plan Note (Signed)
stable overall by hx and exam, most recent data reviewed with pt, and pt to continue medical treatment as before Lab Results  Component Value Date   WBC 5.7 09/21/2011   HGB 9.9* 09/21/2011   HCT 30.2* 09/21/2011   PLT 240.0 09/21/2011   GLUCOSE 135* 11/02/2011   CHOL 166 09/21/2011   TRIG 189.0* 09/21/2011   HDL 36.50* 09/21/2011   LDLDIRECT 92.1 01/16/2011   LDLCALC 92 09/21/2011   ALT 11 01/03/2011   AST 17 01/03/2011   NA 139 11/02/2011   K 4.1 11/02/2011   CL 104 11/02/2011   CREATININE 3.1* 11/02/2011   BUN 49* 11/02/2011   CO2 26 11/02/2011   TSH 2.14 01/16/2011   PSA 4.88* 01/14/2007   INR 1.71* 11/02/2011   HGBA1C 6.5 09/21/2011   MICROALBUR 29.7* 05/18/2009

## 2011-11-04 NOTE — Assessment & Plan Note (Signed)
stable overall by hx and exam, most recent data reviewed with pt, and pt to continue medical treatment as before BP Readings from Last 3 Encounters:  11/02/11 102/58  09/22/11 102/60  09/21/11 120/58

## 2011-11-04 NOTE — Progress Notes (Signed)
Subjective:    Patient ID: Nathan Blackwell, male    DOB: Jul 08, 1920, 78 y.o.   MRN: [id number]  HPI  Here to f/u coumadin et al;  Currently on 2.5 qd except 5 mg on Friday;  No overt bleeding or bruising;  Pt denies chest pain, increased sob or doe, wheezing, orthopnea, PND, increased LE swelling, palpitations, dizziness or syncope, though did see dermatology and suggested he consider higher strength compression stockings although he does not have the strength for that.  Pt denies new neurological symptoms such as new headache, or facial or extremity weakness or numbness   Pt denies polydipsia, polyuria,  Pt denies fever, wt loss, night sweats, loss of appetite, or other constitutional symptoms  Has f/u appt with renal tues, sept 3.  No low sugar symptoms. Overall good compliance with treatment, and good medicine tolerability.  No new complaints.  Denies worsening depressive symptoms, suicidal ideation, or panic, .   Past Medical History  Diagnosis Date  . Diabetes mellitus type II   . Hyperlipidemia   . Hypertension   . Osteoporosis   . Prostatic hypertrophy     benign  . OSA (obstructive sleep apnea)   . Rotator cuff syndrome     chronic  . Elevated PSA   . Hypothyroidism   . Nephrolithiasis   . Thyroid nodule   . GERD (gastroesophageal reflux disease)   . Colitis, Clostridium difficile     presumed 11/08  . Dementia   . Anemia     NOS  . Vitamin B 12 deficiency   . BRADYCARDIA 06/10/2009  . COLON CANCER, HX OF 09/20/2006  . HYPOTHYROIDISM 01/14/2007  . DIABETES MELLITUS, TYPE II 09/20/2006  . HYPERLIPIDEMIA 09/20/2006  . ANEMIA-NOS 06/23/2008  . DEMENTIA 05/14/2007  . DEPRESSION 02/27/2007  . SLEEP APNEA, OBSTRUCTIVE 01/14/2007  . HYPERTENSION 09/20/2006  . GERD 01/14/2007  . OSTEOPOROSIS 10/08/2006  . PERIPHERAL EDEMA 09/03/2007  . RENAL INSUFFICIENCY 04/01/2010  . B12 deficiency 05/25/2010  . CLOSTRIDIUM DIFFICILE COLITIS 01/30/2007  . THYROID NODULE 01/14/2007  . CONSTIPATION,  RECURRENT 01/06/2010  . BENIGN PROSTATIC HYPERTROPHY 10/08/2006  . ARTHRITIS 02/23/2007  . BACK PAIN, THORACIC REGION 06/04/2008  . COLONIC POLYPS, HX OF 10/08/2006  . NEPHROLITHIASIS, HX OF 01/14/2007  . Cancer of colon dx'd 1998  . Cancer of vocal cord dx'd 1998  . 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 DVT of lower limb, acute 01/12/2011   Past Surgical History  Procedure Date  . Cholecystectomy 1999  . Inguinal herniorrhapy 1984  . Appendectomy 1998  . Colon resection 1998    reports that he has never smoked. He has never used smokeless tobacco. He reports that he does not drink alcohol. His drug history not on file. family history includes Diabetes in his mother and Stroke in his father. Allergies  Allergen Reactions  . Biaxin (Clarithromycin)   . Sulfa Antibiotics    Current Outpatient Prescriptions on File Prior to Visit  Medication Sig Dispense Refill  . amLODipine-olmesartan (AZOR) 10-40 MG per tablet Take 1 tablet by mouth daily.  90 tablet  11  . buPROPion (WELLBUTRIN XL) 150 MG 24 hr tablet TAKE 1 TABLET BY MOUTH EVERY DAY  30 tablet  11  . Cholecalciferol (VITAMIN D-3 PO) Take by mouth daily.      . citalopram (CELEXA) 20 MG tablet TAKE 1 TABLET BY MOUTH ONCE A DAY  30 tablet  11  . CREON 12000 UNITS CPEP TAKE 1 TO 2 CAPSULES BY MOUTH  THREE TIMES A DAY WITH EACH MEAL  180 capsule  3  . docusate sodium (COLACE) 100 MG capsule Take 100 mg by mouth at bedtime.        . fenofibrate 160 MG tablet TAKE 1 TABLET BY MOUTH ONCE A DAY  30 tablet  11  . ferrous sulfate 325 (65 FE) MG tablet Take 325 mg by mouth daily.      . furosemide (LASIX) 20 MG tablet 1 tab by mouth twice per day, with the second dose only for persistent left leg swelling  60 tablet  11  . hydrALAZINE (APRESOLINE) 25 MG tablet TAKE 1 TABLET BY MOUTH 3 TIMES DAILY  90 tablet  11  . JANUVIA 50 MG tablet TAKE 1 TABLET BY MOUTH DAILY  30 tablet  11  . lansoprazole (PREVACID) 30 MG capsule TAKE 1 CAPSULE BY MOUTH ONCE  A DAY  30 capsule  11  . OLANZapine (ZYPREXA) 5 MG tablet TAKE 1 TABLET BY MOUTH ONCE A DAY  30 tablet  9  . SYNTHROID 100 MCG tablet TAKE 1 TABLET BY MOUTH ONCE A DAY  30 tablet  11  . Tamsulosin HCl (FLOMAX) 0.4 MG CAPS TAKE 1 CAPSULE BY MOUTH ONCE A DAY AT BEDTIME  30 capsule  11  . triamcinolone cream (KENALOG) 0.1 % Apply topically 2 (two) times daily.  30 g  0  . vitamin B-12 (CYANOCOBALAMIN) 100 MCG tablet Take 50 mcg by mouth daily.      Marland Kitchen warfarin (COUMADIN) 5 MG tablet 2.5 mg on T,TH,Sat,Sun and 5 mg on M,W,F   30 tablet  11   Review of Systems Review of Systems  Constitutional: Negative for diaphoresis and unexpected weight change.  HENT: Negative for drooling and tinnitus.   Eyes: Negative for photophobia and visual disturbance.  Respiratory: Negative for choking and stridor.   Gastrointestinal: Negative for vomiting and blood in stool.  Genitourinary: Negative for hematuria and decreased urine volume.  Musculoskeletal: Negative for acute joint swelling Skin: Negative for color change and wound.  Neurological: Negative for tremors and numbness.    Objective:   Physical Exam BP 102/58  Pulse 62  Temp 97.4 F (36.3 C) (Oral)  Ht 5' 8.5" (1.74 m)  Wt 173 lb 4 oz (78.586 kg)  BMI 25.96 kg/m2  SpO2 98% Physical Exam  VS noted Constitutional: Pt appears well-developed and well-nourished.  HENT: Head: Normocephalic.  Right Ear: External ear normal.  Left Ear: External ear normal.  Eyes: Conjunctivae and EOM are normal. Pupils are equal, round, and reactive to light.  Neck: Normal range of motion. Neck supple.  Cardiovascular: Normal rate and regular rhythm.   Pulmonary/Chest: Effort normal and breath sounds normal.  Neurological: Pt is alert. Not confused Skin: Skin is warm. No erythema. Chronic LE post-phlebitic edema noted no change, no overt bleeding or bruising Psychiatric: Pt behavior is normal. Thought content normal. No depressed affect, not overly nervous      Assessment & Plan:

## 2011-11-04 NOTE — Assessment & Plan Note (Signed)
Lab Results  Component Value Date   INR 1.71* 11/02/2011   INR 2.5* 09/21/2011   INR 2.8* 08/08/2011   Mild low, to increase the coumadin to 2.5 qd, except for 5 on tues/fri, f/u 2 wks, eventually to be referred to office coumadinc clinic when starts in the next month

## 2011-11-04 NOTE — Assessment & Plan Note (Signed)
stable overall by hx and exam, most recent data reviewed with pt, and pt to continue medical treatment as before BP Readings from Last 3 Encounters:  11/02/11 102/58  09/22/11 102/60  09/21/11 120/58    

## 2011-11-07 DIAGNOSIS — R609 Edema, unspecified: Secondary | ICD-10-CM | POA: Diagnosis not present

## 2011-11-07 DIAGNOSIS — I129 Hypertensive chronic kidney disease with stage 1 through stage 4 chronic kidney disease, or unspecified chronic kidney disease: Secondary | ICD-10-CM | POA: Diagnosis not present

## 2011-11-07 DIAGNOSIS — N184 Chronic kidney disease, stage 4 (severe): Secondary | ICD-10-CM | POA: Diagnosis not present

## 2011-11-07 DIAGNOSIS — N2581 Secondary hyperparathyroidism of renal origin: Secondary | ICD-10-CM | POA: Diagnosis not present

## 2011-11-14 DIAGNOSIS — L608 Other nail disorders: Secondary | ICD-10-CM | POA: Diagnosis not present

## 2011-11-14 DIAGNOSIS — E119 Type 2 diabetes mellitus without complications: Secondary | ICD-10-CM | POA: Diagnosis not present

## 2011-11-14 DIAGNOSIS — L84 Corns and callosities: Secondary | ICD-10-CM | POA: Diagnosis not present

## 2011-12-07 ENCOUNTER — Ambulatory Visit (INDEPENDENT_AMBULATORY_CARE_PROVIDER_SITE_OTHER): Payer: Medicare Other

## 2011-12-07 DIAGNOSIS — Z23 Encounter for immunization: Secondary | ICD-10-CM

## 2011-12-11 ENCOUNTER — Telehealth: Payer: Self-pay | Admitting: General Practice

## 2011-12-11 NOTE — Telephone Encounter (Signed)
Tried to reach patient to schedule INR.  Unable to leave message.  1st attempt.

## 2011-12-14 ENCOUNTER — Ambulatory Visit (INDEPENDENT_AMBULATORY_CARE_PROVIDER_SITE_OTHER): Payer: Medicare Other | Admitting: General Practice

## 2011-12-14 DIAGNOSIS — I82409 Acute embolism and thrombosis of unspecified deep veins of unspecified lower extremity: Secondary | ICD-10-CM

## 2011-12-14 DIAGNOSIS — Z7901 Long term (current) use of anticoagulants: Secondary | ICD-10-CM

## 2012-01-04 ENCOUNTER — Ambulatory Visit (INDEPENDENT_AMBULATORY_CARE_PROVIDER_SITE_OTHER): Payer: Medicare Other | Admitting: General Practice

## 2012-01-04 DIAGNOSIS — Z7901 Long term (current) use of anticoagulants: Secondary | ICD-10-CM | POA: Diagnosis not present

## 2012-01-04 DIAGNOSIS — I82409 Acute embolism and thrombosis of unspecified deep veins of unspecified lower extremity: Secondary | ICD-10-CM | POA: Diagnosis not present

## 2012-01-16 DIAGNOSIS — T148XXA Other injury of unspecified body region, initial encounter: Secondary | ICD-10-CM | POA: Diagnosis not present

## 2012-01-16 DIAGNOSIS — I872 Venous insufficiency (chronic) (peripheral): Secondary | ICD-10-CM | POA: Diagnosis not present

## 2012-01-16 DIAGNOSIS — D485 Neoplasm of uncertain behavior of skin: Secondary | ICD-10-CM | POA: Diagnosis not present

## 2012-01-16 DIAGNOSIS — E119 Type 2 diabetes mellitus without complications: Secondary | ICD-10-CM | POA: Diagnosis not present

## 2012-01-16 DIAGNOSIS — L988 Other specified disorders of the skin and subcutaneous tissue: Secondary | ICD-10-CM | POA: Diagnosis not present

## 2012-01-16 DIAGNOSIS — L608 Other nail disorders: Secondary | ICD-10-CM | POA: Diagnosis not present

## 2012-01-25 ENCOUNTER — Ambulatory Visit (INDEPENDENT_AMBULATORY_CARE_PROVIDER_SITE_OTHER): Payer: Medicare Other | Admitting: General Practice

## 2012-01-25 DIAGNOSIS — Z7901 Long term (current) use of anticoagulants: Secondary | ICD-10-CM

## 2012-01-25 DIAGNOSIS — I82409 Acute embolism and thrombosis of unspecified deep veins of unspecified lower extremity: Secondary | ICD-10-CM | POA: Diagnosis not present

## 2012-01-25 LAB — POCT INR: INR: 2.4

## 2012-01-30 ENCOUNTER — Encounter: Payer: Self-pay | Admitting: Internal Medicine

## 2012-01-30 ENCOUNTER — Other Ambulatory Visit (INDEPENDENT_AMBULATORY_CARE_PROVIDER_SITE_OTHER): Payer: Medicare Other

## 2012-01-30 ENCOUNTER — Ambulatory Visit (INDEPENDENT_AMBULATORY_CARE_PROVIDER_SITE_OTHER): Payer: Medicare Other | Admitting: Internal Medicine

## 2012-01-30 VITALS — BP 120/70 | HR 56 | Temp 97.3°F | Ht 68.0 in | Wt 168.4 lb

## 2012-01-30 DIAGNOSIS — E039 Hypothyroidism, unspecified: Secondary | ICD-10-CM

## 2012-01-30 DIAGNOSIS — I10 Essential (primary) hypertension: Secondary | ICD-10-CM | POA: Diagnosis not present

## 2012-01-30 DIAGNOSIS — E119 Type 2 diabetes mellitus without complications: Secondary | ICD-10-CM | POA: Diagnosis not present

## 2012-01-30 DIAGNOSIS — F068 Other specified mental disorders due to known physiological condition: Secondary | ICD-10-CM | POA: Diagnosis not present

## 2012-01-30 LAB — LIPID PANEL
Cholesterol: 173 mg/dL (ref 0–200)
HDL: 36.5 mg/dL — ABNORMAL LOW (ref >39.00)
Total CHOL/HDL Ratio: 5
Triglycerides: 289 mg/dL — ABNORMAL HIGH (ref 0.0–149.0)
VLDL: 57.8 mg/dL — ABNORMAL HIGH (ref 0.0–40.0)

## 2012-01-30 LAB — BASIC METABOLIC PANEL
BUN: 38 mg/dL — ABNORMAL HIGH (ref 6–23)
Calcium: 9 mg/dL (ref 8.4–10.5)
Chloride: 106 mEq/L (ref 96–112)
Creatinine, Ser: 2.5 mg/dL — ABNORMAL HIGH (ref 0.4–1.5)
GFR: 26.19 mL/min — ABNORMAL LOW (ref >60.00)

## 2012-01-30 LAB — HEPATIC FUNCTION PANEL
ALT: 21 U/L (ref 0–53)
Bilirubin, Direct: 0.1 mg/dL (ref 0.0–0.3)
Total Bilirubin: 0.6 mg/dL (ref 0.3–1.2)

## 2012-01-30 LAB — HEMOGLOBIN A1C: Hgb A1c MFr Bld: 6.2 % (ref 4.6–6.5)

## 2012-01-30 NOTE — Progress Notes (Signed)
Subjective:    Patient ID: Nathan Blackwell, male    DOB: 08-10-1920, 78 y.o.   MRN: [id number]  HPI  Here to f/u; overall doing ok,  Pt denies chest pain, increased sob or doe, wheezing, orthopnea, PND, increased LE swelling, palpitations, dizziness or syncope.  Pt denies new neurological symptoms such as new headache, or facial or extremity weakness or numbness   Pt denies polydipsia, polyuria, or low sugar symptoms such as weakness or confusion improved with po intake.  Pt states overall good compliance with meds, trying to follow lower cholesterol diet, wt overall down 5 lbs in last 2-3 months for unclear reasons except for decreased appetitie.   Denies worsening reflux, dysphagia, abd pain, n/v, bowel change or blood, though has noticed some ? Oily stools, o/w no color change.   Denies hyper or hypo thyroid symptoms such as voice, skin or hair change. No increased activity levels, or know malignancy.  Pt denies chest pain, increased sob or doe, wheezing, orthopnea, PND, increased LE swelling, palpitations, dizziness or syncope.   Pt denies polydipsia, polyuria.  No overt bleeding or bruising Past Medical History  Diagnosis Date  . Diabetes mellitus type II   . Hyperlipidemia   . Hypertension   . Osteoporosis   . Prostatic hypertrophy     benign  . OSA (obstructive sleep apnea)   . Rotator cuff syndrome     chronic  . Elevated PSA   . Hypothyroidism   . Nephrolithiasis   . Thyroid nodule   . GERD (gastroesophageal reflux disease)   . Colitis, Clostridium difficile     presumed 11/08  . Dementia   . Anemia     NOS  . Vitamin B 12 deficiency   . BRADYCARDIA 06/10/2009  . COLON CANCER, HX OF 09/20/2006  . HYPOTHYROIDISM 01/14/2007  . DIABETES MELLITUS, TYPE II 09/20/2006  . HYPERLIPIDEMIA 09/20/2006  . ANEMIA-NOS 06/23/2008  . DEMENTIA 05/14/2007  . DEPRESSION 02/27/2007  . SLEEP APNEA, OBSTRUCTIVE 01/14/2007  . HYPERTENSION 09/20/2006  . GERD 01/14/2007  . OSTEOPOROSIS 10/08/2006  .  PERIPHERAL EDEMA 09/03/2007  . RENAL INSUFFICIENCY 04/01/2010  . B12 deficiency 05/25/2010  . CLOSTRIDIUM DIFFICILE COLITIS 01/30/2007  . THYROID NODULE 01/14/2007  . CONSTIPATION, RECURRENT 01/06/2010  . BENIGN PROSTATIC HYPERTROPHY 10/08/2006  . ARTHRITIS 02/23/2007  . BACK PAIN, THORACIC REGION 06/04/2008  . COLONIC POLYPS, HX OF 10/08/2006  . NEPHROLITHIASIS, HX OF 01/14/2007  . Cancer of colon dx'd 1998  . Cancer of vocal cord dx'd 1998  . 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 DVT of lower limb, acute 01/12/2011   Past Surgical History  Procedure Date  . Cholecystectomy 1999  . Inguinal herniorrhapy 1984  . Appendectomy 1998  . Colon resection 1998    reports that he has never smoked. He has never used smokeless tobacco. He reports that he does not drink alcohol. His drug history not on file. family history includes Diabetes in his mother and Stroke in his father. Allergies  Allergen Reactions  . Biaxin (Clarithromycin)   . Sulfa Antibiotics    Current Outpatient Prescriptions on File Prior to Visit  Medication Sig Dispense Refill  . amLODipine-olmesartan (AZOR) 10-40 MG per tablet Take 1 tablet by mouth daily.  90 tablet  11  . buPROPion (WELLBUTRIN XL) 150 MG 24 hr tablet TAKE 1 TABLET BY MOUTH EVERY DAY  30 tablet  11  . Cholecalciferol (VITAMIN D-3 PO) Take by mouth daily.      . citalopram (CELEXA)  20 MG tablet TAKE 1 TABLET BY MOUTH ONCE A DAY  30 tablet  11  . CREON 12000 UNITS CPEP TAKE 1 TO 2 CAPSULES BY MOUTH THREE TIMES A DAY WITH EACH MEAL  180 capsule  3  . docusate sodium (COLACE) 100 MG capsule Take 100 mg by mouth at bedtime.        . fenofibrate 160 MG tablet TAKE 1 TABLET BY MOUTH ONCE A DAY  30 tablet  11  . ferrous sulfate 325 (65 FE) MG tablet Take 325 mg by mouth daily.      . furosemide (LASIX) 20 MG tablet 1 tab by mouth twice per day, with the second dose only for persistent left leg swelling  60 tablet  11  . hydrALAZINE (APRESOLINE) 25 MG tablet TAKE 1  TABLET BY MOUTH 3 TIMES DAILY  90 tablet  11  . JANUVIA 50 MG tablet TAKE 1 TABLET BY MOUTH DAILY  30 tablet  11  . lansoprazole (PREVACID) 30 MG capsule TAKE 1 CAPSULE BY MOUTH ONCE A DAY  30 capsule  11  . OLANZapine (ZYPREXA) 5 MG tablet TAKE 1 TABLET BY MOUTH ONCE A DAY  30 tablet  9  . SYNTHROID 100 MCG tablet TAKE 1 TABLET BY MOUTH ONCE A DAY  30 tablet  11  . Tamsulosin HCl (FLOMAX) 0.4 MG CAPS TAKE 1 CAPSULE BY MOUTH ONCE A DAY AT BEDTIME  30 capsule  11  . triamcinolone cream (KENALOG) 0.1 % Apply topically 2 (two) times daily.  30 g  0  . vitamin B-12 (CYANOCOBALAMIN) 100 MCG tablet Take 50 mcg by mouth daily.      Marland Kitchen warfarin (COUMADIN) 5 MG tablet 2.5 mg on T,TH,Sat,Sun and 5 mg on M,W,F   30 tablet  11   Review of Systems  Constitutional: Negative for diaphoresis and unexpected weight change.  HENT: Negative for tinnitus.   Eyes: Negative for photophobia and visual disturbance.  Respiratory: Negative for choking and stridor.   Gastrointestinal: Negative for vomiting and blood in stool.  Genitourinary: Negative for hematuria and decreased urine volume.  Musculoskeletal: Negative for gait problem.  Skin: Negative for color change and wound.  Neurological: Negative for tremors and numbness.  Psychiatric/Behavioral: Negative for decreased concentration. The patient is not hyperactive.       Objective:   Physical Exam BP 120/70  Pulse 56  Temp 97.3 F (36.3 C) (Oral)  Ht 5\' 8"  (1.727 m)  Wt 168 lb 6 oz (76.374 kg)  BMI 25.60 kg/m2  SpO2 98% Physical Exam  VS noted Constitutional: Pt appears well-developed and well-nourished. but thinner than last visit HENT: Head: Normocephalic.  Right Ear: External ear normal.  Left Ear: External ear normal.  Eyes: Conjunctivae and EOM are normal. Pupils are equal, round, and reactive to light.  Neck: Normal range of motion. Neck supple.  Cardiovascular: Normal rate and regular rhythm.   Pulmonary/Chest: Effort normal and breath  sounds normal.  Abd:  Soft, NT, non-distended, + BS Neurological: Pt is alert. Motor grossly intact Skin: Skin is warm. No erythema.  Psychiatric: Pt behavior is normal..     Assessment & Plan:

## 2012-01-30 NOTE — Assessment & Plan Note (Signed)
With mild recent wt loss - ? Geriatric decline vs other, o/w stable overall by hx and exam, most recent data reviewed with pt, and pt to continue medical treatment as before Lab Results  Component Value Date   WBC 5.7 09/21/2011   HGB 9.9* 09/21/2011   HCT 30.2* 09/21/2011   PLT 240.0 09/21/2011   GLUCOSE 135* 11/02/2011   CHOL 166 09/21/2011   TRIG 189.0* 09/21/2011   HDL 36.50* 09/21/2011   LDLDIRECT 92.1 01/16/2011   LDLCALC 92 09/21/2011   ALT 11 01/03/2011   AST 17 01/03/2011   NA 139 11/02/2011   K 4.1 11/02/2011   CL 104 11/02/2011   CREATININE 3.1* 11/02/2011   BUN 49* 11/02/2011   CO2 26 11/02/2011   TSH 2.14 01/16/2011   PSA 4.88* 01/14/2007   INR 2.4 01/25/2012   HGBA1C 6.5 09/21/2011   MICROALBUR 29.7* 05/18/2009

## 2012-01-30 NOTE — Assessment & Plan Note (Signed)
stable overall by hx and exam, most recent data reviewed with pt, and pt to continue medical treatment as before Lab Results  Component Value Date   TSH 2.14 01/16/2011

## 2012-01-30 NOTE — Assessment & Plan Note (Signed)
stable overall by hx and exam, most recent data reviewed with pt, and pt to continue medical treatment as before,  to f/u any worsening symptoms or concerns  Lab Results  Component Value Date   HGBA1C 6.5 09/21/2011

## 2012-01-30 NOTE — Assessment & Plan Note (Signed)
To cont f/u with renal as he does Lab Results  Component Value Date   CREATININE 3.1* 11/02/2011

## 2012-01-30 NOTE — Patient Instructions (Addendum)
Continue all other medications as before Please have the pharmacy call with any other refills you may need. Please go to LAB in the Basement for the blood and/or urine tests to be done today You will be contacted by phone if any changes need to be made immediately.  Otherwise, you will receive a letter about your results with an explanation, but please check with MyChart first. Thank you for enrolling in MyChart. Please follow the instructions below to securely access your online medical record. MyChart allows you to send messages to your doctor, view your test results, renew your prescriptions, schedule appointments, and more. To Log into MyChart, please go to [email or web address], and your Username is:  [email or web address] Please keep your appointments with your specialists as you have planned - renal Please return in 6 months, or sooner if needed

## 2012-01-30 NOTE — Assessment & Plan Note (Signed)
stable overall by hx and exam, most recent data reviewed with pt, and pt to continue medical treatment as before BP Readings from Last 3 Encounters:  01/30/12 120/70  11/02/11 102/58  09/22/11 102/60

## 2012-01-31 LAB — LDL CHOLESTEROL, DIRECT: Direct LDL: 107.8 mg/dL

## 2012-02-10 ENCOUNTER — Other Ambulatory Visit: Payer: Self-pay | Admitting: Internal Medicine

## 2012-02-23 ENCOUNTER — Ambulatory Visit (INDEPENDENT_AMBULATORY_CARE_PROVIDER_SITE_OTHER): Payer: Medicare Other | Admitting: General Practice

## 2012-02-23 DIAGNOSIS — Z7901 Long term (current) use of anticoagulants: Secondary | ICD-10-CM | POA: Diagnosis not present

## 2012-02-23 DIAGNOSIS — I82409 Acute embolism and thrombosis of unspecified deep veins of unspecified lower extremity: Secondary | ICD-10-CM

## 2012-02-23 LAB — POCT INR: INR: 3.5

## 2012-03-02 ENCOUNTER — Other Ambulatory Visit: Payer: Self-pay | Admitting: Internal Medicine

## 2012-03-11 ENCOUNTER — Other Ambulatory Visit: Payer: Self-pay | Admitting: Internal Medicine

## 2012-03-11 NOTE — Telephone Encounter (Signed)
Error

## 2012-03-12 ENCOUNTER — Other Ambulatory Visit: Payer: Self-pay | Admitting: Internal Medicine

## 2012-03-18 DIAGNOSIS — N2581 Secondary hyperparathyroidism of renal origin: Secondary | ICD-10-CM | POA: Diagnosis not present

## 2012-03-18 DIAGNOSIS — N184 Chronic kidney disease, stage 4 (severe): Secondary | ICD-10-CM | POA: Diagnosis not present

## 2012-03-19 DIAGNOSIS — L84 Corns and callosities: Secondary | ICD-10-CM | POA: Diagnosis not present

## 2012-03-19 DIAGNOSIS — E119 Type 2 diabetes mellitus without complications: Secondary | ICD-10-CM | POA: Diagnosis not present

## 2012-03-19 DIAGNOSIS — L608 Other nail disorders: Secondary | ICD-10-CM | POA: Diagnosis not present

## 2012-03-22 ENCOUNTER — Ambulatory Visit (INDEPENDENT_AMBULATORY_CARE_PROVIDER_SITE_OTHER): Payer: Medicare Other | Admitting: General Practice

## 2012-03-22 DIAGNOSIS — I82409 Acute embolism and thrombosis of unspecified deep veins of unspecified lower extremity: Secondary | ICD-10-CM

## 2012-03-22 DIAGNOSIS — Z7901 Long term (current) use of anticoagulants: Secondary | ICD-10-CM | POA: Diagnosis not present

## 2012-03-22 LAB — POCT INR: INR: 1.4

## 2012-04-02 ENCOUNTER — Ambulatory Visit (INDEPENDENT_AMBULATORY_CARE_PROVIDER_SITE_OTHER): Payer: Medicare Other | Admitting: General Practice

## 2012-04-02 DIAGNOSIS — I82409 Acute embolism and thrombosis of unspecified deep veins of unspecified lower extremity: Secondary | ICD-10-CM

## 2012-04-02 DIAGNOSIS — Z7901 Long term (current) use of anticoagulants: Secondary | ICD-10-CM | POA: Diagnosis not present

## 2012-04-23 ENCOUNTER — Ambulatory Visit (INDEPENDENT_AMBULATORY_CARE_PROVIDER_SITE_OTHER): Payer: Medicare Other | Admitting: General Practice

## 2012-04-23 DIAGNOSIS — I82409 Acute embolism and thrombosis of unspecified deep veins of unspecified lower extremity: Secondary | ICD-10-CM | POA: Diagnosis not present

## 2012-04-23 DIAGNOSIS — Z7901 Long term (current) use of anticoagulants: Secondary | ICD-10-CM | POA: Diagnosis not present

## 2012-04-23 LAB — POCT INR: INR: 1.7

## 2012-05-04 ENCOUNTER — Other Ambulatory Visit: Payer: Self-pay | Admitting: Internal Medicine

## 2012-05-21 ENCOUNTER — Ambulatory Visit (INDEPENDENT_AMBULATORY_CARE_PROVIDER_SITE_OTHER): Payer: Medicare Other | Admitting: General Practice

## 2012-05-21 DIAGNOSIS — N2581 Secondary hyperparathyroidism of renal origin: Secondary | ICD-10-CM | POA: Diagnosis not present

## 2012-05-21 DIAGNOSIS — R609 Edema, unspecified: Secondary | ICD-10-CM | POA: Diagnosis not present

## 2012-05-21 DIAGNOSIS — I129 Hypertensive chronic kidney disease with stage 1 through stage 4 chronic kidney disease, or unspecified chronic kidney disease: Secondary | ICD-10-CM | POA: Diagnosis not present

## 2012-05-21 DIAGNOSIS — N184 Chronic kidney disease, stage 4 (severe): Secondary | ICD-10-CM | POA: Diagnosis not present

## 2012-05-21 DIAGNOSIS — Z7901 Long term (current) use of anticoagulants: Secondary | ICD-10-CM | POA: Diagnosis not present

## 2012-05-21 DIAGNOSIS — L608 Other nail disorders: Secondary | ICD-10-CM | POA: Diagnosis not present

## 2012-05-21 DIAGNOSIS — D649 Anemia, unspecified: Secondary | ICD-10-CM | POA: Diagnosis not present

## 2012-05-21 LAB — POCT INR: INR: 2.3

## 2012-06-08 ENCOUNTER — Other Ambulatory Visit: Payer: Self-pay | Admitting: Internal Medicine

## 2012-06-18 ENCOUNTER — Ambulatory Visit (INDEPENDENT_AMBULATORY_CARE_PROVIDER_SITE_OTHER): Payer: Medicare Other | Admitting: General Practice

## 2012-06-18 DIAGNOSIS — I82409 Acute embolism and thrombosis of unspecified deep veins of unspecified lower extremity: Secondary | ICD-10-CM

## 2012-06-18 DIAGNOSIS — Z7901 Long term (current) use of anticoagulants: Secondary | ICD-10-CM | POA: Diagnosis not present

## 2012-06-18 LAB — POCT INR: INR: 3.1

## 2012-07-09 ENCOUNTER — Other Ambulatory Visit (INDEPENDENT_AMBULATORY_CARE_PROVIDER_SITE_OTHER): Payer: Medicare Other

## 2012-07-09 ENCOUNTER — Other Ambulatory Visit: Payer: Self-pay | Admitting: Internal Medicine

## 2012-07-09 ENCOUNTER — Ambulatory Visit (INDEPENDENT_AMBULATORY_CARE_PROVIDER_SITE_OTHER): Payer: Medicare Other | Admitting: Internal Medicine

## 2012-07-09 ENCOUNTER — Ambulatory Visit (INDEPENDENT_AMBULATORY_CARE_PROVIDER_SITE_OTHER)
Admission: RE | Admit: 2012-07-09 | Discharge: 2012-07-09 | Disposition: A | Discharge status: Home / Self Care | Payer: Medicare Other | Source: Ambulatory Visit | Attending: Internal Medicine | Admitting: Internal Medicine

## 2012-07-09 ENCOUNTER — Encounter: Payer: Self-pay | Admitting: Internal Medicine

## 2012-07-09 VITALS — BP 140/60 | HR 71 | Temp 98.0°F | Ht 69.0 in | Wt 153.5 lb

## 2012-07-09 DIAGNOSIS — M25519 Pain in unspecified shoulder: Secondary | ICD-10-CM | POA: Diagnosis not present

## 2012-07-09 DIAGNOSIS — R1013 Epigastric pain: Secondary | ICD-10-CM | POA: Diagnosis not present

## 2012-07-09 DIAGNOSIS — R10816 Epigastric abdominal tenderness: Secondary | ICD-10-CM

## 2012-07-09 DIAGNOSIS — R634 Abnormal weight loss: Secondary | ICD-10-CM

## 2012-07-09 DIAGNOSIS — R1310 Dysphagia, unspecified: Secondary | ICD-10-CM

## 2012-07-09 DIAGNOSIS — R112 Nausea with vomiting, unspecified: Secondary | ICD-10-CM

## 2012-07-09 DIAGNOSIS — M25511 Pain in right shoulder: Secondary | ICD-10-CM

## 2012-07-09 LAB — URINALYSIS, ROUTINE W REFLEX MICROSCOPIC
Ketones, ur: NEGATIVE
Leukocytes, UA: NEGATIVE
Specific Gravity, Urine: 1.015 (ref 1.000–1.030)
Urine Glucose: NEGATIVE
Urobilinogen, UA: 0.2 (ref 0.0–1.0)
pH: 5.5 (ref 5.0–8.0)

## 2012-07-09 LAB — CBC WITH DIFFERENTIAL/PLATELET
Basophils Absolute: 0.1 10*3/uL (ref 0.0–0.1)
Basophils Relative: 0.4 % (ref 0.0–3.0)
Eosinophils Absolute: 0 10*3/uL (ref 0.0–0.7)
Hemoglobin: 11.9 g/dL — ABNORMAL LOW (ref 13.0–17.0)
Lymphocytes Relative: 9.2 % — ABNORMAL LOW (ref 12.0–46.0)
MCHC: 34.7 g/dL (ref 30.0–36.0)
Monocytes Relative: 4 % (ref 3.0–12.0)
Neutro Abs: 14.2 10*3/uL — ABNORMAL HIGH (ref 1.4–7.7)
Neutrophils Relative %: 86.3 % — ABNORMAL HIGH (ref 43.0–77.0)
RBC: 3.62 Mil/uL — ABNORMAL LOW (ref 4.22–5.81)
WBC: 16.4 10*3/uL — ABNORMAL HIGH (ref 4.5–10.5)

## 2012-07-09 LAB — SEDIMENTATION RATE: Sed Rate: 39 mm/hr — ABNORMAL HIGH (ref 0–22)

## 2012-07-09 LAB — HEPATIC FUNCTION PANEL
ALT: 21 U/L (ref 0–53)
Albumin: 3.9 g/dL (ref 3.5–5.2)
Total Protein: 7.2 g/dL (ref 6.0–8.3)

## 2012-07-09 LAB — BASIC METABOLIC PANEL
BUN: 44 mg/dL — ABNORMAL HIGH (ref 6–23)
Calcium: 9 mg/dL (ref 8.4–10.5)
GFR: 21.76 mL/min — ABNORMAL LOW (ref >60.00)
Glucose, Bld: 109 mg/dL — ABNORMAL HIGH (ref 70–99)
Potassium: 3.9 mEq/L (ref 3.5–5.1)

## 2012-07-09 LAB — LIPASE: Lipase: 31 U/L (ref 11.0–59.0)

## 2012-07-09 MED ORDER — CEPHALEXIN 500 MG PO CAPS: 500.0000 mg | ORAL_CAPSULE | Freq: Four times a day (QID) | ORAL | Status: DC

## 2012-07-09 MED ORDER — FUROSEMIDE 20 MG PO TABS: ORAL_TABLET | ORAL | Status: DC

## 2012-07-09 MED ORDER — AMLODIPINE-OLMESARTAN 10-40 MG PO TABS: ORAL_TABLET | ORAL | Status: DC

## 2012-07-09 NOTE — Progress Notes (Signed)
Subjective:    Patient ID: Nathan Blackwell, male    DOB: December 10, 1920, 79 y.o.   MRN: [id number]  HPI  Here with wife, concerned with pt sudden onset n/v persistent overnight and intermittent for 2 days assoc with wt loss several lbs, assoc with right upper sharp CP, no blood and Denies worsening reflux, abd pain, dysphagia, bowel change or blood, except for pills that continue to get stuck mid upper chest despite fluids, and does not seem to have trouble with more solid food.  Has overall decreased appetite in recent wks as well, hx of colon cancer. Denies urinary symptoms such as dysuria, frequency, urgency, flank pain, hematuria or n/v, fever, chills. Now taking azor qhs and lasix increased to 40 mg per Dr Patel/renal.  Also mentions right shoulder decreased ROM and pain to abduct fully for over 1 month.  Pt denies increased sob or doe, wheezing, orthopnea, PND, increased LE swelling, palpitations, dizziness or syncope. Past Medical History  Diagnosis Date  . Diabetes mellitus type II   . Hyperlipidemia   . Hypertension   . Osteoporosis   . Prostatic hypertrophy     benign  . OSA (obstructive sleep apnea)   . Rotator cuff syndrome     chronic  . Elevated PSA   . Hypothyroidism   . Nephrolithiasis   . Thyroid nodule   . GERD (gastroesophageal reflux disease)   . Colitis, Clostridium difficile     presumed 11/08  . Dementia   . Anemia     NOS  . Vitamin B 12 deficiency   . BRADYCARDIA 06/10/2009  . COLON CANCER, HX OF 09/20/2006  . HYPOTHYROIDISM 01/14/2007  . DIABETES MELLITUS, TYPE II 09/20/2006  . HYPERLIPIDEMIA 09/20/2006  . ANEMIA-NOS 06/23/2008  . DEMENTIA 05/14/2007  . DEPRESSION 02/27/2007  . SLEEP APNEA, OBSTRUCTIVE 01/14/2007  . HYPERTENSION 09/20/2006  . GERD 01/14/2007  . OSTEOPOROSIS 10/08/2006  . PERIPHERAL EDEMA 09/03/2007  . RENAL INSUFFICIENCY 04/01/2010  . B12 deficiency 05/25/2010  . CLOSTRIDIUM DIFFICILE COLITIS 01/30/2007  . THYROID NODULE 01/14/2007  . CONSTIPATION,  RECURRENT 01/06/2010  . BENIGN PROSTATIC HYPERTROPHY 10/08/2006  . ARTHRITIS 02/23/2007  . BACK PAIN, THORACIC REGION 06/04/2008  . COLONIC POLYPS, HX OF 10/08/2006  . NEPHROLITHIASIS, HX OF 01/14/2007  . Cancer of colon dx'd 1998  . Cancer of vocal cord dx'd 1998  . 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 DVT of lower limb, acute 01/12/2011   Past Surgical History  Procedure Laterality Date  . Cholecystectomy  1999  . Inguinal herniorrhapy  1984  . Appendectomy  1998  . Colon resection  1998    reports that he has never smoked. He has never used smokeless tobacco. He reports that he does not drink alcohol. His drug history is not on file. family history includes Diabetes in his mother and Stroke in his father. Allergies  Allergen Reactions  . Biaxin (Clarithromycin)   . Sulfa Antibiotics    Current Outpatient Prescriptions on File Prior to Visit  Medication Sig Dispense Refill  . buPROPion (WELLBUTRIN XL) 150 MG 24 hr tablet TAKE 1 TABLET BY MOUTH EVERY DAY  30 tablet  11  . Cholecalciferol (VITAMIN D-3 PO) Take by mouth daily.      . citalopram (CELEXA) 20 MG tablet TAKE 1 TABLET BY MOUTH ONCE A DAY  30 tablet  11  . CREON 12000 UNITS CPEP TAKE 1 TO 2 CAPSULES BY MOUTH THREE TIMES A DAY WITH EACH MEAL  180 capsule  3  .  docusate sodium (COLACE) 100 MG capsule Take 100 mg by mouth at bedtime.        . fenofibrate 160 MG tablet TAKE 1 TABLET BY MOUTH ONCE A DAY  30 tablet  11  . ferrous sulfate 325 (65 FE) MG tablet Take 325 mg by mouth daily.      . hydrALAZINE (APRESOLINE) 25 MG tablet TAKE 1 TABLET BY MOUTH 3 TIMES DAILY  90 tablet  11  . JANUVIA 50 MG tablet TAKE 1 TABLET BY MOUTH DAILY  30 tablet  11  . lansoprazole (PREVACID) 30 MG capsule TAKE 1 CAPSULE BY MOUTH ONCE A DAY  30 capsule  11  . OLANZapine (ZYPREXA) 5 MG tablet TAKE 1 TABLET BY MOUTH ONCE A DAY  30 tablet  9  . SYNTHROID 100 MCG tablet TAKE 1 TABLET BY MOUTH ONCE A DAY  30 tablet  11  . Tamsulosin HCl (FLOMAX) 0.4 MG  CAPS TAKE 1 CAPSULE BY MOUTH ONCE A DAY AT BEDTIME  30 capsule  11  . vitamin B-12 (CYANOCOBALAMIN) 100 MCG tablet Take 50 mcg by mouth daily.      Marland Kitchen warfarin (COUMADIN) 5 MG tablet Take 1 tablet (5 mg total) by mouth daily. Take as directed by coumadin clinic.  30 tablet  3   No current facility-administered medications on file prior to visit.   Review of Systems  Constitutional: Negative for unexpected weight change, or unusual diaphoresis  HENT: Negative for tinnitus.   Eyes: Negative for photophobia and visual disturbance.  Respiratory: Negative for choking and stridor.   Gastrointestinal: Negative for vomiting and blood in stool.  Genitourinary: Negative for hematuria and decreased urine volume.  Musculoskeletal: Negative for acute joint swelling Skin: Negative for color change and wound.  Neurological: Negative for tremors and numbness other than noted  Psychiatric/Behavioral: Negative for decreased concentration or  hyperactivity.       Objective:   Physical Exam BP 140/60  Pulse 71  Temp(Src) 98 F (36.7 C) (Oral)  Ht 5\' 9"  (1.753 m)  Wt 153 lb 8 oz (69.627 kg)  BMI 22.66 kg/m2  SpO2 97% VS noted,  Constitutional: Pt appears well-developed and well-nourished.  HENT: Head: NCAT.  Right Ear: External ear normal.  Left Ear: External ear normal.  Eyes: Conjunctivae and EOM are normal. Pupils are equal, round, and reactive to light.  Neck: Normal range of motion. Neck supple.  Cardiovascular: Normal rate and regular rhythm.   Pulmonary/Chest: Effort normal and breath sounds normal.  Abd:  Soft, NT, non-distended, + BS except epigastric tender Neurological: Pt is alert. Has baseline confusion Skin: Skin is warm. No erythema. No LE edema Right shouder mild tender, decreased ROM to abduction Psychiatric: Pt behavior is normal.     Assessment & Plan:

## 2012-07-09 NOTE — Patient Instructions (Signed)
Please continue all other medications as before Please have the pharmacy call with any other refills you may need. Please go to the XRAY Department in the Basement (go straight as you get off the elevator) for the x-ray testing Please go to the LAB in the Basement (turn left off the elevator) for the tests to be done today You will be contacted regarding the referral for: Abdomen ultrasound, and Dr Patterson/[facility] Quin will be contacted regarding the referral for: orthopedic for the right shoulder Please keep your appointments with your specialists as you have planned  - Dr Allena Katz Please return in 3 months, or sooner if needed

## 2012-07-10 ENCOUNTER — Telehealth: Payer: Self-pay

## 2012-07-10 ENCOUNTER — Ambulatory Visit (INDEPENDENT_AMBULATORY_CARE_PROVIDER_SITE_OTHER)
Admission: RE | Admit: 2012-07-10 | Discharge: 2012-07-10 | Disposition: A | Discharge status: Home / Self Care | Payer: Medicare Other | Source: Ambulatory Visit | Attending: Internal Medicine | Admitting: Internal Medicine

## 2012-07-10 DIAGNOSIS — R05 Cough: Secondary | ICD-10-CM

## 2012-07-10 DIAGNOSIS — J9819 Other pulmonary collapse: Secondary | ICD-10-CM | POA: Diagnosis not present

## 2012-07-10 NOTE — Telephone Encounter (Signed)
The patients wife stated the patient has a bad cough.  Stated the MD did listen to him yesterday but did not mention it.  Please advise on what to do?

## 2012-07-10 NOTE — Telephone Encounter (Signed)
Did not hear wheezes or rales;  pna is possible however, and if she wants and if he is able, ok for CXR today - cough

## 2012-07-10 NOTE — Telephone Encounter (Signed)
Please order cxr the patients wife agreed to bring him in today.

## 2012-07-10 NOTE — Telephone Encounter (Signed)
done

## 2012-07-11 ENCOUNTER — Telehealth: Payer: Self-pay

## 2012-07-11 DIAGNOSIS — M25519 Pain in unspecified shoulder: Secondary | ICD-10-CM | POA: Diagnosis not present

## 2012-07-11 NOTE — Telephone Encounter (Signed)
Patient's family informed.

## 2012-07-11 NOTE — Telephone Encounter (Signed)
Message copied by Pincus Sanes on Thu Jul 11, 2012 10:59 AM ------      Message from: Corwin Levins      Created: Wed Jul 10, 2012  3:52 PM       No that is the usual, though he could take 2 pills twice per day if 1 pill four times per day is not going to work out      ----- Message -----         From: Vladimir Crofts Markeem Noreen         Sent: 07/10/2012   3:38 PM           To: Corwin Levins, MD            The wife picked up antibiotic.  She wanted confirmation that he is to take 500mg   Four times daily as she thought seemed like a large dose.         ------

## 2012-07-13 ENCOUNTER — Other Ambulatory Visit: Payer: Self-pay | Admitting: Internal Medicine

## 2012-07-14 DIAGNOSIS — M25511 Pain in right shoulder: Secondary | ICD-10-CM | POA: Insufficient documentation

## 2012-07-14 DIAGNOSIS — R1312 Dysphagia, oropharyngeal phase: Secondary | ICD-10-CM | POA: Insufficient documentation

## 2012-07-14 DIAGNOSIS — R10816 Epigastric abdominal tenderness: Secondary | ICD-10-CM | POA: Insufficient documentation

## 2012-07-14 NOTE — Assessment & Plan Note (Addendum)
?   signficance with pill dysphagia only, cont current tx

## 2012-07-14 NOTE — Assessment & Plan Note (Signed)
With several lbs wt loss, n/v, pain , hx of colon ca, for abd u/s, refer GI

## 2012-07-14 NOTE — Assessment & Plan Note (Signed)
?   rotater cuff - for ortho referral

## 2012-07-14 NOTE — Assessment & Plan Note (Signed)
?   Etiology, for eval as above

## 2012-07-15 ENCOUNTER — Encounter: Payer: Self-pay | Admitting: Internal Medicine

## 2012-07-15 ENCOUNTER — Ambulatory Visit
Admission: RE | Admit: 2012-07-15 | Discharge: 2012-07-15 | Disposition: A | Discharge status: Home / Self Care | Payer: Medicare Other | Source: Ambulatory Visit | Attending: Internal Medicine | Admitting: Internal Medicine

## 2012-07-15 DIAGNOSIS — R10816 Epigastric abdominal tenderness: Secondary | ICD-10-CM

## 2012-07-15 DIAGNOSIS — R634 Abnormal weight loss: Secondary | ICD-10-CM

## 2012-07-15 DIAGNOSIS — R109 Unspecified abdominal pain: Secondary | ICD-10-CM | POA: Diagnosis not present

## 2012-07-15 DIAGNOSIS — R1310 Dysphagia, unspecified: Secondary | ICD-10-CM

## 2012-07-15 DIAGNOSIS — R112 Nausea with vomiting, unspecified: Secondary | ICD-10-CM | POA: Diagnosis not present

## 2012-07-16 ENCOUNTER — Encounter: Payer: Self-pay | Admitting: Gastroenterology

## 2012-07-16 ENCOUNTER — Telehealth: Payer: Self-pay | Admitting: Internal Medicine

## 2012-07-16 ENCOUNTER — Ambulatory Visit (INDEPENDENT_AMBULATORY_CARE_PROVIDER_SITE_OTHER): Payer: Medicare Other | Admitting: General Practice

## 2012-07-16 DIAGNOSIS — I82409 Acute embolism and thrombosis of unspecified deep veins of unspecified lower extremity: Secondary | ICD-10-CM | POA: Diagnosis not present

## 2012-07-16 DIAGNOSIS — Z7901 Long term (current) use of anticoagulants: Secondary | ICD-10-CM

## 2012-07-16 LAB — POCT INR: INR: 2.1

## 2012-07-16 NOTE — Telephone Encounter (Signed)
rec'd from Delbert Harness forward 3 pages to Dr.John

## 2012-07-23 DIAGNOSIS — E119 Type 2 diabetes mellitus without complications: Secondary | ICD-10-CM | POA: Diagnosis not present

## 2012-07-23 DIAGNOSIS — L608 Other nail disorders: Secondary | ICD-10-CM | POA: Diagnosis not present

## 2012-07-24 ENCOUNTER — Other Ambulatory Visit: Payer: Self-pay | Admitting: Dermatology

## 2012-07-24 DIAGNOSIS — D239 Other benign neoplasm of skin, unspecified: Secondary | ICD-10-CM | POA: Diagnosis not present

## 2012-07-24 DIAGNOSIS — C44611 Basal cell carcinoma of skin of unspecified upper limb, including shoulder: Secondary | ICD-10-CM | POA: Diagnosis not present

## 2012-07-24 DIAGNOSIS — L565 Disseminated superficial actinic porokeratosis (DSAP): Secondary | ICD-10-CM | POA: Diagnosis not present

## 2012-07-24 DIAGNOSIS — Z85828 Personal history of other malignant neoplasm of skin: Secondary | ICD-10-CM | POA: Diagnosis not present

## 2012-07-24 DIAGNOSIS — L570 Actinic keratosis: Secondary | ICD-10-CM | POA: Diagnosis not present

## 2012-07-24 DIAGNOSIS — D485 Neoplasm of uncertain behavior of skin: Secondary | ICD-10-CM | POA: Diagnosis not present

## 2012-07-27 ENCOUNTER — Other Ambulatory Visit: Payer: Self-pay | Admitting: Internal Medicine

## 2012-07-27 ENCOUNTER — Other Ambulatory Visit: Payer: Self-pay | Admitting: Gastroenterology

## 2012-08-01 ENCOUNTER — Ambulatory Visit: Payer: Medicare Other | Admitting: Internal Medicine

## 2012-08-04 ENCOUNTER — Emergency Department (HOSPITAL_COMMUNITY)
Admission: EM | Admit: 2012-08-04 | Discharge: 2012-08-04 | Disposition: A | Discharge status: Home / Self Care | Payer: Medicare Other | Attending: Emergency Medicine | Admitting: Emergency Medicine

## 2012-08-04 ENCOUNTER — Encounter (HOSPITAL_COMMUNITY): Payer: Self-pay | Admitting: Emergency Medicine

## 2012-08-04 DIAGNOSIS — Z79899 Other long term (current) drug therapy: Secondary | ICD-10-CM | POA: Insufficient documentation

## 2012-08-04 DIAGNOSIS — Z87448 Personal history of other diseases of urinary system: Secondary | ICD-10-CM | POA: Diagnosis not present

## 2012-08-04 DIAGNOSIS — K219 Gastro-esophageal reflux disease without esophagitis: Secondary | ICD-10-CM | POA: Insufficient documentation

## 2012-08-04 DIAGNOSIS — Z862 Personal history of diseases of the blood and blood-forming organs and certain disorders involving the immune mechanism: Secondary | ICD-10-CM | POA: Insufficient documentation

## 2012-08-04 DIAGNOSIS — Z8679 Personal history of other diseases of the circulatory system: Secondary | ICD-10-CM | POA: Insufficient documentation

## 2012-08-04 DIAGNOSIS — Z8619 Personal history of other infectious and parasitic diseases: Secondary | ICD-10-CM | POA: Insufficient documentation

## 2012-08-04 DIAGNOSIS — Z8659 Personal history of other mental and behavioral disorders: Secondary | ICD-10-CM | POA: Diagnosis not present

## 2012-08-04 DIAGNOSIS — Z791 Long term (current) use of non-steroidal anti-inflammatories (NSAID): Secondary | ICD-10-CM | POA: Insufficient documentation

## 2012-08-04 DIAGNOSIS — F0390 Unspecified dementia without behavioral disturbance: Secondary | ICD-10-CM | POA: Diagnosis not present

## 2012-08-04 DIAGNOSIS — R404 Transient alteration of awareness: Secondary | ICD-10-CM | POA: Diagnosis not present

## 2012-08-04 DIAGNOSIS — I10 Essential (primary) hypertension: Secondary | ICD-10-CM | POA: Diagnosis not present

## 2012-08-04 DIAGNOSIS — Z860100 Personal history of colon polyps, unspecified: Secondary | ICD-10-CM | POA: Insufficient documentation

## 2012-08-04 DIAGNOSIS — R42 Dizziness and giddiness: Secondary | ICD-10-CM | POA: Insufficient documentation

## 2012-08-04 DIAGNOSIS — Z8739 Personal history of other diseases of the musculoskeletal system and connective tissue: Secondary | ICD-10-CM | POA: Insufficient documentation

## 2012-08-04 DIAGNOSIS — Z85038 Personal history of other malignant neoplasm of large intestine: Secondary | ICD-10-CM | POA: Diagnosis not present

## 2012-08-04 DIAGNOSIS — E119 Type 2 diabetes mellitus without complications: Secondary | ICD-10-CM | POA: Insufficient documentation

## 2012-08-04 DIAGNOSIS — Z8669 Personal history of other diseases of the nervous system and sense organs: Secondary | ICD-10-CM | POA: Diagnosis not present

## 2012-08-04 DIAGNOSIS — R5383 Other fatigue: Secondary | ICD-10-CM | POA: Diagnosis not present

## 2012-08-04 DIAGNOSIS — Z87442 Personal history of urinary calculi: Secondary | ICD-10-CM | POA: Diagnosis not present

## 2012-08-04 DIAGNOSIS — Z8639 Personal history of other endocrine, nutritional and metabolic disease: Secondary | ICD-10-CM | POA: Insufficient documentation

## 2012-08-04 DIAGNOSIS — R5381 Other malaise: Secondary | ICD-10-CM | POA: Diagnosis not present

## 2012-08-04 LAB — URINALYSIS, ROUTINE W REFLEX MICROSCOPIC
Glucose, UA: NEGATIVE mg/dL
Hgb urine dipstick: NEGATIVE
Protein, ur: NEGATIVE mg/dL
Specific Gravity, Urine: 1.012 (ref 1.005–1.030)

## 2012-08-04 LAB — CBC WITH DIFFERENTIAL/PLATELET
Eosinophils Absolute: 0.1 10*3/uL (ref 0.0–0.7)
Lymphocytes Relative: 17 % (ref 12–46)
Lymphs Abs: 1.1 10*3/uL (ref 0.7–4.0)
MCH: 32 pg (ref 26.0–34.0)
Neutro Abs: 5 10*3/uL (ref 1.7–7.7)
Neutrophils Relative %: 79 % — ABNORMAL HIGH (ref 43–77)
Platelets: 282 10*3/uL (ref 150–400)
RBC: 3.62 MIL/uL — ABNORMAL LOW (ref 4.22–5.81)
WBC: 6.4 10*3/uL (ref 4.0–10.5)

## 2012-08-04 LAB — COMPREHENSIVE METABOLIC PANEL
ALT: 22 U/L (ref 0–53)
Alkaline Phosphatase: 43 U/L (ref 39–117)
Chloride: 101 mEq/L (ref 96–112)
GFR calc Af Amer: 18 mL/min — ABNORMAL LOW (ref >90)
Glucose, Bld: 129 mg/dL — ABNORMAL HIGH (ref 70–99)
Potassium: 4.7 mEq/L (ref 3.5–5.1)
Sodium: 138 mEq/L (ref 135–145)
Total Protein: 7.4 g/dL (ref 6.0–8.3)

## 2012-08-04 LAB — POCT I-STAT TROPONIN I: Troponin i, poc: 0.06 ng/mL (ref 0.00–0.08)

## 2012-08-04 NOTE — ED Notes (Signed)
Patient brought in via [facility] with a near syncopal episode time three today, Patient has a history of the same over the last year

## 2012-08-04 NOTE — ED Provider Notes (Signed)
History     CSN: [id number]  Arrival date & time 08/04/12  1229   First MD Initiated Contact with Patient 08/04/12 1301      Chief Complaint  Patient presents with  . Near Syncope    (Consider location/radiation/quality/duration/timing/severity/associated sxs/prior treatment) HPI Comments: Patient with multiple medical conditions including DVT on chronic Coumadin, diabetes -- presents after having 3 episodes of lightheadedness without full syncope while at church this morning. Wife reports that the patient has been having multiple episodes for a very long time however over the past 2-3 weeks they have been occurring more frequently. Patient has never passed completely out. The symptoms are resolved immediately upon sitting. Patient does not report a sensation of spinning or disequilibrium. He has not had trouble walking. He denies associated chest pain or shortness of breath. He denies severe headache or abdominal pain. He denies weakness of his arms or legs. He has mentioned these symptoms to his primary care physician but does not think he has had a workup for these. Episodes today were the same as previous except for increased frequency. Onset of symptoms acute. Course is resolved. Nothing makes symptoms worse. Sitting makes symptoms better.  The history is provided by the patient.    Past Medical History  Diagnosis Date  . Diabetes mellitus type II   . Hyperlipidemia   . Hypertension   . Osteoporosis   . Prostatic hypertrophy     benign  . OSA (obstructive sleep apnea)   . Rotator cuff syndrome     chronic  . Elevated PSA   . Hypothyroidism   . Nephrolithiasis   . Thyroid nodule   . GERD (gastroesophageal reflux disease)   . Colitis, Clostridium difficile     presumed 11/08  . Dementia   . Anemia     NOS  . Vitamin B 12 deficiency   . BRADYCARDIA 06/10/2009  . COLON CANCER, HX OF 09/20/2006  . HYPOTHYROIDISM 01/14/2007  . DIABETES MELLITUS, TYPE II 09/20/2006  .  HYPERLIPIDEMIA 09/20/2006  . ANEMIA-NOS 06/23/2008  . DEMENTIA 05/14/2007  . DEPRESSION 02/27/2007  . SLEEP APNEA, OBSTRUCTIVE 01/14/2007  . HYPERTENSION 09/20/2006  . GERD 01/14/2007  . OSTEOPOROSIS 10/08/2006  . PERIPHERAL EDEMA 09/03/2007  . RENAL INSUFFICIENCY 04/01/2010  . B12 deficiency 05/25/2010  . CLOSTRIDIUM DIFFICILE COLITIS 01/30/2007  . THYROID NODULE 01/14/2007  . CONSTIPATION, RECURRENT 01/06/2010  . BENIGN PROSTATIC HYPERTROPHY 10/08/2006  . ARTHRITIS 02/23/2007  . BACK PAIN, THORACIC REGION 06/04/2008  . COLONIC POLYPS, HX OF 10/08/2006  . NEPHROLITHIASIS, HX OF 01/14/2007  . Cancer of colon dx'd 1998  . Cancer of vocal cord dx'd 1998  . 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 DVT of lower limb, acute 01/12/2011    Past Surgical History  Procedure Laterality Date  . Cholecystectomy  1999  . Inguinal herniorrhapy  1984  . Appendectomy  1998  . Colon resection  1998    Family History  Problem Relation Age of Onset  . Diabetes Mother   . Stroke Father     History  Substance Use Topics  . Smoking status: Never Smoker   . Smokeless tobacco: Never Used  . Alcohol Use: No      Review of Systems  Constitutional: Negative for fever.  HENT: Negative for sore throat and rhinorrhea.   Eyes: Negative for redness.  Respiratory: Negative for cough.   Cardiovascular: Negative for chest pain, palpitations and leg swelling.  Gastrointestinal: Negative for nausea, vomiting, abdominal pain and diarrhea.  Genitourinary: Negative for dysuria.  Musculoskeletal: Negative for myalgias.  Skin: Negative for rash.  Neurological: Positive for light-headedness. Negative for syncope, weakness and headaches.    Allergies  Biaxin and Sulfa antibiotics  Home Medications   Current Outpatient Rx  Name  Route  Sig  Dispense  Refill  . amLODipine-olmesartan (AZOR) 10-40 MG per tablet      TAKE 1 TABLET BY MOUTH ONCE in the PM   90 tablet   3   . buPROPion (WELLBUTRIN XL) 150 MG 24  hr tablet      TAKE 1 TABLET BY MOUTH EVERY DAY   30 tablet   11   . cephALEXin (KEFLEX) 500 MG capsule   Oral   Take 1 capsule (500 mg total) by mouth 4 (four) times daily.   40 capsule   0   . Cholecalciferol (VITAMIN D-3 PO)   Oral   Take by mouth daily.         . citalopram (CELEXA) 20 MG tablet      TAKE 1 TABLET BY MOUTH ONCE A DAY   30 tablet   11   . CREON 12000 UNITS CPEP      TAKE 1 TO 2 CAPSULES BY MOUTH THREE TIMES A DAY WITH EACH MEAL   180 each   3   . docusate sodium (COLACE) 100 MG capsule   Oral   Take 100 mg by mouth at bedtime.           . fenofibrate 160 MG tablet      TAKE 1 TABLET BY MOUTH ONCE A DAY   30 tablet   11   . ferrous sulfate 325 (65 FE) MG tablet   Oral   Take 325 mg by mouth daily.         . furosemide (LASIX) 20 MG tablet      1 tab by mouth twice per day   60 tablet   11   . hydrALAZINE (APRESOLINE) 25 MG tablet      TAKE 1 TABLET BY MOUTH 3 TIMES DAILY   90 tablet   11   . JANUVIA 50 MG tablet      TAKE 1 TABLET BY MOUTH DAILY   30 tablet   11   . lansoprazole (PREVACID) 30 MG capsule      TAKE 1 CAPSULE BY MOUTH ONCE A DAY   30 capsule   11   . OLANZapine (ZYPREXA) 5 MG tablet      TAKE 1 TABLET BY MOUTH ONCE A DAY   30 tablet   11   . SYNTHROID 100 MCG tablet      TAKE 1 TABLET BY MOUTH ONCE A DAY   30 tablet   11     Dispense as written.   . Tamsulosin HCl (FLOMAX) 0.4 MG CAPS      TAKE 1 CAPSULE BY MOUTH ONCE A DAY AT BEDTIME   30 capsule   11   . vitamin B-12 (CYANOCOBALAMIN) 100 MCG tablet   Oral   Take 50 mcg by mouth daily.         Marland Kitchen warfarin (COUMADIN) 5 MG tablet   Oral   Take 1 tablet (5 mg total) by mouth daily. Take as directed by coumadin clinic.   30 tablet   3     BP 126/47  Pulse 69  Temp(Src) 98 F (36.7 C) (Oral)  Resp 16  SpO2 100%  Physical Exam  Nursing note  and vitals reviewed. Constitutional: He is oriented to person, place, and time. He  appears well-developed and well-nourished.  HENT:  Head: Normocephalic and atraumatic.  Right Ear: Tympanic membrane, external ear and ear canal normal.  Left Ear: Tympanic membrane, external ear and ear canal normal.  Nose: Nose normal.  Mouth/Throat: Uvula is midline, oropharynx is clear and moist and mucous membranes are normal.  Eyes: Conjunctivae, EOM and lids are normal. Pupils are equal, round, and reactive to light.  No nystagmus.  Neck: Normal range of motion. Neck supple.  Cardiovascular: Normal rate, regular rhythm, normal heart sounds and intact distal pulses.   No murmur heard. Pulmonary/Chest: Effort normal and breath sounds normal.  Abdominal: Soft. There is no tenderness.  Musculoskeletal: Normal range of motion.       Cervical back: He exhibits normal range of motion, no tenderness and no bony tenderness.  Neurological: He is alert and oriented to person, place, and time. He has normal strength and normal reflexes. No cranial nerve deficit or sensory deficit. He exhibits normal muscle tone. He displays a negative Romberg sign. Coordination and gait normal. GCS eye subscore is 4. GCS verbal subscore is 5. GCS motor subscore is 6.  No lightheadedness with standing.  Skin: Skin is warm and dry.  Psychiatric: He has a normal mood and affect.    ED Course  Procedures (including critical care time)  Labs Reviewed  CBC WITH DIFFERENTIAL - Abnormal; Notable for the following:    RBC 3.62 (*)    Hemoglobin 11.6 (*)    HCT 35.3 (*)    Neutrophils Relative % 79 (*)    All other components within normal limits  COMPREHENSIVE METABOLIC PANEL - Abnormal; Notable for the following:    Glucose, Bld 129 (*)    BUN 50 (*)    Creatinine, Ser 3.20 (*)    Total Bilirubin 0.2 (*)    GFR calc non Af Amer 16 (*)    GFR calc Af Amer 18 (*)    All other components within normal limits  PROTIME-INR - Abnormal; Notable for the following:    Prothrombin Time 37.3 (*)    INR 4.11 (*)     All other components within normal limits  URINALYSIS, ROUTINE W REFLEX MICROSCOPIC  POCT I-STAT TROPONIN I   No results found.   1. Episodic lightheadedness     1:27 PM Patient seen and examined. Work-up initiated. Medications ordered. D/w Dr. Oletta Lamas.   Vital signs reviewed and are as follows: Filed Vitals:   08/04/12 1318  BP: 126/47  Pulse: 69  Temp:   Resp:     Date: 08/04/2012  Rate: 59  Rhythm: normal sinus rhythm, multifocal PVC  QRS Axis: normal  Intervals: normal  ST/T Wave abnormalities: normal  Conduction Disutrbances:none  Narrative Interpretation:   Old EKG Reviewed: unchanged from 01/01/11 except for new PVCs    4:34 PM Patient seen by Dr. Oletta Lamas. Feel patient is safe to go home.   Patient informed of elevated Coumadin level. Urged patient to not take his nighttime Coumadin. Urged patient to call his Coumadin clinic tomorrow for further instructions.  Patient urged to return immediately to the emergency department if he has worsening of symptoms including full syncope, chest pain, or shortness of breath. Patient will call primary care physician tomorrow to schedule appointment for this week.   MDM  Patient with frequent episodes of lightheadedness without chest pain or shortness of breath but no full syncope. Symptoms are the same  as what he has been having for a long time, however did been more frequent over the past 2-3 weeks. Patient is neurologically intact. EKG is nonischemic. He does not appear to be orthostatic. He appears well, nontoxic. Unclear etiology at this point however the patient has good primary care physician followup. He seems reliable to return if worsening. Will discharge to home.        Renne Crigler, PA-C 08/04/12 [phone]

## 2012-08-04 NOTE — ED Provider Notes (Signed)
Medical screening examination/treatment/procedure(s) were conducted as a shared visit with non-physician practitioner(s) and myself.  I personally evaluated the patient during the encounter  Pt with positional change with feeling dizzy, lightheaded and faint.  Pt has had similar symptoms over the past several weeks.  No actual syncope.  No CP, SOB, HA, back pain, neck pain during episode.  None of these symptoms or complaints now.  No nystagmus, feels resolved back to baseline now.  Pt with renal insuff and anemia, is at or near baseline compared to recent prior labs.  Troponin is negative.  ECG shows no active ischemia.  Just freq ectopy.  Pt is encouraged to follow up with PCP this week.  He is offered observation admission for near syncope, but he and spouse are comfortable being discharged to home.    Impression: Weakness Dizziness    Gavin Pound. Christl Fessenden, MD 08/04/12 1615

## 2012-08-04 NOTE — ED Notes (Signed)
NAD noted at time of d/c home 

## 2012-08-06 ENCOUNTER — Ambulatory Visit (INDEPENDENT_AMBULATORY_CARE_PROVIDER_SITE_OTHER): Payer: Medicare Other | Admitting: Family Medicine

## 2012-08-06 ENCOUNTER — Ambulatory Visit (INDEPENDENT_AMBULATORY_CARE_PROVIDER_SITE_OTHER)
Admission: RE | Admit: 2012-08-06 | Discharge: 2012-08-06 | Disposition: A | Discharge status: Home / Self Care | Payer: Medicare Other | Source: Ambulatory Visit | Attending: Internal Medicine | Admitting: Internal Medicine

## 2012-08-06 ENCOUNTER — Encounter: Payer: Self-pay | Admitting: Internal Medicine

## 2012-08-06 ENCOUNTER — Ambulatory Visit (INDEPENDENT_AMBULATORY_CARE_PROVIDER_SITE_OTHER): Payer: Medicare Other | Admitting: Internal Medicine

## 2012-08-06 VITALS — BP 102/52 | HR 80 | Temp 97.0°F | Ht 69.0 in | Wt 155.0 lb

## 2012-08-06 DIAGNOSIS — I82409 Acute embolism and thrombosis of unspecified deep veins of unspecified lower extremity: Secondary | ICD-10-CM

## 2012-08-06 DIAGNOSIS — M25559 Pain in unspecified hip: Secondary | ICD-10-CM

## 2012-08-06 DIAGNOSIS — Z7901 Long term (current) use of anticoagulants: Secondary | ICD-10-CM

## 2012-08-06 DIAGNOSIS — S79929A Unspecified injury of unspecified thigh, initial encounter: Secondary | ICD-10-CM | POA: Diagnosis not present

## 2012-08-06 DIAGNOSIS — I10 Essential (primary) hypertension: Secondary | ICD-10-CM

## 2012-08-06 DIAGNOSIS — E119 Type 2 diabetes mellitus without complications: Secondary | ICD-10-CM

## 2012-08-06 DIAGNOSIS — I951 Orthostatic hypotension: Secondary | ICD-10-CM | POA: Diagnosis not present

## 2012-08-06 DIAGNOSIS — M25551 Pain in right hip: Secondary | ICD-10-CM | POA: Insufficient documentation

## 2012-08-06 NOTE — Addendum Note (Signed)
Addended by: Corwin Levins on: 08/06/2012 09:53 AM   Modules accepted: Orders

## 2012-08-06 NOTE — Assessment & Plan Note (Addendum)
S/p fall with trying to turn, feet got mixed up, fell to right hip - for film today/now, appears alert

## 2012-08-06 NOTE — Assessment & Plan Note (Signed)
stable overall by history and exam, recent data reviewed with pt, and pt to continue medical treatment as before,  to f/u any worsening symptoms or concerns ble Lab Results  Component Value Date   HGBA1C 6.2 01/30/2012

## 2012-08-06 NOTE — Patient Instructions (Signed)
OK to stop the azor Please continue all other medications as before, and refills have been done if requested. Please keep your appointments with your specialists as you have planned - Dr patel in 3 wks  Please return in 6 months, or sooner if needed

## 2012-08-06 NOTE — Progress Notes (Addendum)
Subjective:    Patient ID: Nathan Blackwell, male    DOB: Dec 26, 1920, 79 y.o.   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  Has abnormal orthostatics today.  Seen in ER with evaluation o/w neg for worsening clinical status except creatinine mildly elevated.  On increased lasix now up to 40 mg per renal/Dr Allena Katz (mar 18).  No falls or frank syncope. No recent documented cardiac.  Pt denies fever, night sweats, or other constitutional symptoms except for lower appetitie and has lost wt overall from 172, but has actually gained 2 lbs since May 6.  Pt denies polydipsia, polyuria,  Past Medical History  Diagnosis Date  . Diabetes mellitus type II   . Hyperlipidemia   . Hypertension   . Osteoporosis   . Prostatic hypertrophy     benign  . OSA (obstructive sleep apnea)   . Rotator cuff syndrome     chronic  . Elevated PSA   . Hypothyroidism   . Nephrolithiasis   . Thyroid nodule   . GERD (gastroesophageal reflux disease)   . Colitis, Clostridium difficile     presumed 11/08  . Dementia   . Anemia     NOS  . Vitamin B 12 deficiency   . BRADYCARDIA 06/10/2009  . COLON CANCER, HX OF 09/20/2006  . HYPOTHYROIDISM 01/14/2007  . DIABETES MELLITUS, TYPE II 09/20/2006  . HYPERLIPIDEMIA 09/20/2006  . ANEMIA-NOS 06/23/2008  . DEMENTIA 05/14/2007  . DEPRESSION 02/27/2007  . SLEEP APNEA, OBSTRUCTIVE 01/14/2007  . HYPERTENSION 09/20/2006  . GERD 01/14/2007  . OSTEOPOROSIS 10/08/2006  . PERIPHERAL EDEMA 09/03/2007  . RENAL INSUFFICIENCY 04/01/2010  . B12 deficiency 05/25/2010  . CLOSTRIDIUM DIFFICILE COLITIS 01/30/2007  . THYROID NODULE 01/14/2007  . CONSTIPATION, RECURRENT 01/06/2010  . BENIGN PROSTATIC HYPERTROPHY 10/08/2006  . ARTHRITIS 02/23/2007  . BACK PAIN, THORACIC REGION 06/04/2008  .  COLONIC POLYPS, HX OF 10/08/2006  . NEPHROLITHIASIS, HX OF 01/14/2007  . Cancer of colon dx'd 1998  . Cancer of vocal cord dx'd 1998  . 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 DVT of lower limb, acute 01/12/2011   Past Surgical History  Procedure Laterality Date  . Cholecystectomy  1999  . Inguinal herniorrhapy  1984  . Appendectomy  1998  . Colon resection  1998    reports that he has never smoked. He has never used smokeless tobacco. He reports that he does not drink alcohol. His drug history is not on file. family history includes Diabetes in his mother and Stroke in his father. Allergies  Allergen Reactions  . Biaxin (Clarithromycin) Other (See Comments)    Unknown   . Other Other (See Comments)    Nephrologist has told patient not to eat many different foods (tomatoes, [employer], etc).   . Sulfa Antibiotics Other (See Comments)    Unknown    Current Outpatient Prescriptions on File Prior to Visit  Medication Sig Dispense Refill  . amLODipine-olmesartan (AZOR) 10-40 MG per tablet Take 1 tablet by mouth daily at 6 PM.      . buPROPion (WELLBUTRIN XL) 150 MG 24 hr tablet Take 150 mg by mouth every morning.      . Cholecalciferol (VITAMIN D-3 PO) Take by mouth daily.      . citalopram (CELEXA) 20 MG  tablet Take 20 mg by mouth daily.      Marland Kitchen docusate sodium (COLACE) 100 MG capsule Take 100 mg by mouth at bedtime.        . fenofibrate 160 MG tablet Take 160 mg by mouth daily.      . ferrous sulfate 325 (65 FE) MG tablet Take 325 mg by mouth daily.      . furosemide (LASIX) 20 MG tablet Take 40 mg by mouth daily.      . hydrALAZINE (APRESOLINE) 25 MG tablet Take 25 mg by mouth 3 (three) times daily.      . lansoprazole (PREVACID) 30 MG capsule Take 30 mg by mouth daily.      Marland Kitchen levothyroxine (SYNTHROID, LEVOTHROID) 100 MCG tablet Take 100 mcg by mouth daily before breakfast.      . lipase/protease/amylase (CREON-12/PANCREASE) 12000 UNITS CPEP Take 1-2 capsules by mouth 3 (three)  times daily with meals.      Marland Kitchen OLANZapine (ZYPREXA) 5 MG tablet Take 5 mg by mouth daily.      . sitaGLIPtin (JANUVIA) 50 MG tablet Take 50 mg by mouth daily.      . tamsulosin (FLOMAX) 0.4 MG CAPS Take 0.4 mg by mouth at bedtime.      . vitamin B-12 (CYANOCOBALAMIN) 100 MCG tablet Take 50 mcg by mouth daily.      Marland Kitchen warfarin (COUMADIN) 5 MG tablet Take 5 mg by mouth daily at 6 PM. Takes 5mg  on wed and sat each week Takes 2.5mg  all other days       No current facility-administered medications on file prior to visit.   Review of Systems  Constitutional: Negative for unexpected weight change, or unusual diaphoresis  HENT: Negative for tinnitus.   Eyes: Negative for photophobia and visual disturbance.  Respiratory: Negative for choking and stridor.   Gastrointestinal: Negative for vomiting and blood in stool.  Genitourinary: Negative for hematuria and decreased urine volume.  Musculoskeletal: Negative for acute joint swelling Skin: Negative for color change and wound.  Neurological: Negative for tremors and numbness other than noted  Psychiatric/Behavioral: Negative for decreased concentration or  hyperactivity.       Objective:   Physical Exam BP 102/52  Pulse 80  Temp(Src) 97 F (36.1 C) (Oral)  Ht 5\' 9"  (1.753 m)  Wt 155 lb (70.308 kg)  BMI 22.88 kg/m2  SpO2 96% VS noted,  Constitutional: Pt appears well-developed and well-nourished but thinner overall than last yr.  HENT: Head: NCAT.  Right Ear: External ear normal.  Left Ear: External ear normal.  Eyes: Conjunctivae and EOM are normal. Pupils are equal, round, and reactive to light.  Neck: Normal range of motion. Neck supple.  Cardiovascular: Normal rate and regular rhythm.   Pulmonary/Chest: Effort normal and breath sounds normal.  Abd:  Soft, NT, non-distended, + BS Neurological: Pt is alert.   Skin: Skin is warm. No erythema.  Psychiatric: Pt behavior is normal    Assessment & Plan:   ADDENDUM:  Pt fell after  ambulating about 50 ft down the hallway to check with Bailey Mech regarding f/u for elev INR 4.11 June 1.  No syncope, but c/o pain to right hip.  Will order right hip film stat.

## 2012-08-06 NOTE — Assessment & Plan Note (Signed)
Suspect overall ? mild volume depletion in the setting of mult med also affecting BP including azor, lasix, hydralazine, and possibly flomax.  With slight worsening GFR most recently at 16. Ok to d/c azor, cont other meds, has f/u June 24 with labs with Dr Allena Katz, pt to let him know of symptoms as well

## 2012-08-06 NOTE — Assessment & Plan Note (Signed)
See above, to d/c azor, I think ok to allow SBP to be closer to 140-150 given recent symptoms and age

## 2012-08-12 ENCOUNTER — Ambulatory Visit (INDEPENDENT_AMBULATORY_CARE_PROVIDER_SITE_OTHER): Payer: Medicare Other | Admitting: Gastroenterology

## 2012-08-12 ENCOUNTER — Encounter: Payer: Self-pay | Admitting: Gastroenterology

## 2012-08-12 VITALS — BP 120/58 | HR 72 | Ht 69.0 in | Wt 154.0 lb

## 2012-08-12 DIAGNOSIS — R634 Abnormal weight loss: Secondary | ICD-10-CM

## 2012-08-12 DIAGNOSIS — R1310 Dysphagia, unspecified: Secondary | ICD-10-CM

## 2012-08-12 DIAGNOSIS — R269 Unspecified abnormalities of gait and mobility: Secondary | ICD-10-CM | POA: Diagnosis not present

## 2012-08-12 NOTE — Progress Notes (Signed)
History of Present Illness: 79 year old white male referred at the request of Dr. Jonny Ruiz for evaluation of dysphagia and weight loss. Upper endoscopy in 2011 demonstrated a distal esophageal stricture and possible paraesophageal hiatal hernia. Upper GI series showed significant gastroesophageal reflux with a mixed sliding and paraesophageal gastric hernia. Colonoscopy in 2010 demonstrated a questionable ileitis and stricture.   Biopsies of the terminal ileum were negative. Adenomatous polyps were removed in 2008.  Patient complains of dysphagia only to pills. Appetite is fair. He's lost 25 pounds in the past year which he attributes to decreased apetite. He's noted black stools but he is taking over-the-counter iron.  He's becoming increasingly unsteady on his feet and has had several falls. He is on Coumadin because of DVTs.    Past Medical History  Diagnosis Date  . Diabetes mellitus type II   . Hyperlipidemia   . Hypertension   . Osteoporosis   . Prostatic hypertrophy     benign  . OSA (obstructive sleep apnea)   . Rotator cuff syndrome     chronic  . Elevated PSA   . Hypothyroidism   . Nephrolithiasis   . Thyroid nodule   . GERD (gastroesophageal reflux disease)   . Colitis, Clostridium difficile     presumed 11/08  . Dementia   . Anemia     NOS  . Vitamin B 12 deficiency   . BRADYCARDIA 06/10/2009  . COLON CANCER, HX OF 09/20/2006  . HYPOTHYROIDISM 01/14/2007  . DIABETES MELLITUS, TYPE II 09/20/2006  . HYPERLIPIDEMIA 09/20/2006  . ANEMIA-NOS 06/23/2008  . DEMENTIA 05/14/2007  . DEPRESSION 02/27/2007  . SLEEP APNEA, OBSTRUCTIVE 01/14/2007  . HYPERTENSION 09/20/2006  . GERD 01/14/2007  . OSTEOPOROSIS 10/08/2006  . PERIPHERAL EDEMA 09/03/2007  . RENAL INSUFFICIENCY 04/01/2010  . B12 deficiency 05/25/2010  . CLOSTRIDIUM DIFFICILE COLITIS 01/30/2007  . THYROID NODULE 01/14/2007  . CONSTIPATION, RECURRENT 01/06/2010  . BENIGN PROSTATIC HYPERTROPHY 10/08/2006  . ARTHRITIS 02/23/2007  . BACK  PAIN, THORACIC REGION 06/04/2008  . COLONIC POLYPS, HX OF 10/08/2006  . NEPHROLITHIASIS, HX OF 01/14/2007  . Cancer of colon dx'd 1998  . Cancer of vocal cord dx'd 1998  . 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 DVT of lower limb, acute 01/12/2011   Past Surgical History  Procedure Laterality Date  . Cholecystectomy  1999  . Inguinal herniorrhapy  1984  . Appendectomy  1998  . Colon resection  1998   family history includes Diabetes in his mother and Stroke in his father. Current Outpatient Prescriptions  Medication Sig Dispense Refill  . buPROPion (WELLBUTRIN XL) 150 MG 24 hr tablet Take 150 mg by mouth every morning.      . Cholecalciferol (VITAMIN D-3 PO) Take by mouth daily.      . citalopram (CELEXA) 20 MG tablet Take 20 mg by mouth daily.      Marland Kitchen docusate sodium (COLACE) 100 MG capsule Take 100 mg by mouth at bedtime.        . fenofibrate 160 MG tablet Take 160 mg by mouth daily.      . ferrous sulfate 325 (65 FE) MG tablet Take 325 mg by mouth daily.      . furosemide (LASIX) 20 MG tablet Take 40 mg by mouth daily.      . hydrALAZINE (APRESOLINE) 25 MG tablet Take 25 mg by mouth 3 (three) times daily.      . lansoprazole (PREVACID) 30 MG capsule Take 30 mg by mouth daily.      Marland Kitchen  levothyroxine (SYNTHROID, LEVOTHROID) 100 MCG tablet Take 100 mcg by mouth daily before breakfast.      . lipase/protease/amylase (CREON-12/PANCREASE) 12000 UNITS CPEP Take 1-2 capsules by mouth 3 (three) times daily with meals.      Marland Kitchen OLANZapine (ZYPREXA) 5 MG tablet Take 5 mg by mouth daily.      . sitaGLIPtin (JANUVIA) 50 MG tablet Take 50 mg by mouth daily.      . tamsulosin (FLOMAX) 0.4 MG CAPS Take 0.4 mg by mouth at bedtime.      . vitamin B-12 (CYANOCOBALAMIN) 100 MCG tablet Take 50 mcg by mouth daily.      Marland Kitchen warfarin (COUMADIN) 5 MG tablet Take 5 mg by mouth daily at 6 PM. Takes 5mg  on wed and sat each week Takes 2.5mg  all other days       No current facility-administered medications for this  visit.   Allergies as of 08/12/2012 - Review Complete 08/12/2012  Allergen Reaction Noted  . Biaxin (clarithromycin) Other (See Comments) 06/25/2008  . Other Other (See Comments) 08/04/2012  . Sulfa antibiotics Other (See Comments) 01/06/2011    reports that he has never smoked. He has never used smokeless tobacco. He reports that he does not drink alcohol. His drug history is not on file.     Review of Systems: Pertinent positive and negative review of systems were noted in the above HPI section. All other review of systems were otherwise negative.  Vital signs were reviewed in today's medical record Physical Exam: General: He is an elderly well nourished male in no acute distress Skin: anicteric Head: Normocephalic and atraumatic Eyes:  sclerae anicteric, EOMI Ears: Normal auditory acuity Mouth: No deformity or lesions Neck: Supple, no masses or thyromegaly Lungs: Clear throughout to auscultation Heart: Regular rate and rhythm; no murmurs, rubs or bruits Abdomen: Soft, non tender and non distended. No masses, hepatosplenomegaly or hernias noted. Normal Bowel sounds Rectal: There are no rectal masses. Stool is Hemoccult negative Musculoskeletal: Symmetrical with no gross deformities  Skin: No lesions on visible extremities Pulses:  Normal pulses noted Extremities: No clubbing, cyanosis, edema or deformities noted Neurological: Alert oriented x 4, grossly nonfocal Cervical Nodes:  No significant cervical adenopathy Inguinal Nodes: No significant inguinal adenopathy Psychological:  Alert and cooperative. Normal mood and affect

## 2012-08-12 NOTE — Assessment & Plan Note (Addendum)
This appears to be a worsening problem. The patient has had several falls. This calls into question the wisdom of keeping him on Coumadin.  Recommendations #1 I will send a note to his PCP, Dr. Jonny Ruiz, raising the question whether he should be maintained on Coumadin in view of his risk for falling.

## 2012-08-12 NOTE — Patient Instructions (Addendum)
Follow up as needed

## 2012-08-12 NOTE — Assessment & Plan Note (Signed)
Decreasing appetite and weight loss may be more reflective of his advanced age then underlying pathology. Exam is unremarkable and weight loss seems to have stabilized.  Recommendations #1 should weight loss become more rapid and severe I would consider CT of the abdomen and pelvis

## 2012-08-12 NOTE — Assessment & Plan Note (Addendum)
Dysphagia is specific to pills only. He claims to have no difficulty swallowing solids and liquids. I doubt that he is symptomatic from the distal esophageal stricture.  Recommendations #1 no further GI workup or therapy unless dysphagia worsens and includes solids

## 2012-08-13 ENCOUNTER — Telehealth: Payer: Self-pay | Admitting: General Practice

## 2012-08-13 NOTE — Telephone Encounter (Signed)
Outgoing call:  Spoke with patient's wife and instructed patient to stop coumadin per Dr. Jonny Ruiz.

## 2012-08-13 NOTE — Telephone Encounter (Signed)
See Patient's wife's chart - Kimoni Pagliarulo - DOB 04/14/33 - Refer to e-mail and telephone note dated 6/10.

## 2012-08-14 ENCOUNTER — Telehealth: Payer: Self-pay | Admitting: Internal Medicine

## 2012-08-14 NOTE — Telephone Encounter (Signed)
Ok to reduce the hydralazine to HALF of the 25 mg tid \  Robin to inform pt, and adjust med list

## 2012-08-14 NOTE — Telephone Encounter (Signed)
BP this morning was99/61, wife is very concerned. Request Call back

## 2012-08-14 NOTE — Telephone Encounter (Signed)
Informed the wife to reduce the hydralazine 25 mg to half tid.  She informed they had just checked his bp 10 minutes earlier and was 106/52.

## 2012-08-20 ENCOUNTER — Ambulatory Visit (INDEPENDENT_AMBULATORY_CARE_PROVIDER_SITE_OTHER): Payer: Medicare Other | Admitting: General Practice

## 2012-08-20 DIAGNOSIS — N184 Chronic kidney disease, stage 4 (severe): Secondary | ICD-10-CM | POA: Diagnosis not present

## 2012-08-20 DIAGNOSIS — D649 Anemia, unspecified: Secondary | ICD-10-CM | POA: Diagnosis not present

## 2012-08-20 DIAGNOSIS — Z7901 Long term (current) use of anticoagulants: Secondary | ICD-10-CM

## 2012-08-20 DIAGNOSIS — N2581 Secondary hyperparathyroidism of renal origin: Secondary | ICD-10-CM | POA: Diagnosis not present

## 2012-08-22 DIAGNOSIS — C44519 Basal cell carcinoma of skin of other part of trunk: Secondary | ICD-10-CM | POA: Diagnosis not present

## 2012-08-27 DIAGNOSIS — D649 Anemia, unspecified: Secondary | ICD-10-CM | POA: Diagnosis not present

## 2012-08-27 DIAGNOSIS — N2581 Secondary hyperparathyroidism of renal origin: Secondary | ICD-10-CM | POA: Diagnosis not present

## 2012-08-27 DIAGNOSIS — R609 Edema, unspecified: Secondary | ICD-10-CM | POA: Diagnosis not present

## 2012-08-27 DIAGNOSIS — N184 Chronic kidney disease, stage 4 (severe): Secondary | ICD-10-CM | POA: Diagnosis not present

## 2012-09-04 ENCOUNTER — Telehealth: Payer: Self-pay

## 2012-09-04 ENCOUNTER — Emergency Department (HOSPITAL_COMMUNITY)
Admission: EM | Admit: 2012-09-04 | Discharge: 2012-09-04 | Disposition: A | Discharge status: Home / Self Care | Payer: Medicare Other | Attending: Emergency Medicine | Admitting: Emergency Medicine

## 2012-09-04 ENCOUNTER — Emergency Department (HOSPITAL_COMMUNITY): Payer: Medicare Other

## 2012-09-04 ENCOUNTER — Encounter (HOSPITAL_COMMUNITY): Payer: Self-pay

## 2012-09-04 DIAGNOSIS — Z8679 Personal history of other diseases of the circulatory system: Secondary | ICD-10-CM | POA: Insufficient documentation

## 2012-09-04 DIAGNOSIS — Z87448 Personal history of other diseases of urinary system: Secondary | ICD-10-CM | POA: Diagnosis not present

## 2012-09-04 DIAGNOSIS — Z862 Personal history of diseases of the blood and blood-forming organs and certain disorders involving the immune mechanism: Secondary | ICD-10-CM | POA: Diagnosis not present

## 2012-09-04 DIAGNOSIS — M549 Dorsalgia, unspecified: Secondary | ICD-10-CM

## 2012-09-04 DIAGNOSIS — K219 Gastro-esophageal reflux disease without esophagitis: Secondary | ICD-10-CM | POA: Diagnosis not present

## 2012-09-04 DIAGNOSIS — I10 Essential (primary) hypertension: Secondary | ICD-10-CM | POA: Insufficient documentation

## 2012-09-04 DIAGNOSIS — E039 Hypothyroidism, unspecified: Secondary | ICD-10-CM | POA: Insufficient documentation

## 2012-09-04 DIAGNOSIS — S0990XA Unspecified injury of head, initial encounter: Secondary | ICD-10-CM | POA: Diagnosis not present

## 2012-09-04 DIAGNOSIS — D649 Anemia, unspecified: Secondary | ICD-10-CM | POA: Insufficient documentation

## 2012-09-04 DIAGNOSIS — Z8639 Personal history of other endocrine, nutritional and metabolic disease: Secondary | ICD-10-CM | POA: Insufficient documentation

## 2012-09-04 DIAGNOSIS — IMO0002 Reserved for concepts with insufficient information to code with codable children: Secondary | ICD-10-CM | POA: Insufficient documentation

## 2012-09-04 DIAGNOSIS — F329 Major depressive disorder, single episode, unspecified: Secondary | ICD-10-CM | POA: Insufficient documentation

## 2012-09-04 DIAGNOSIS — E119 Type 2 diabetes mellitus without complications: Secondary | ICD-10-CM | POA: Diagnosis not present

## 2012-09-04 DIAGNOSIS — Z8619 Personal history of other infectious and parasitic diseases: Secondary | ICD-10-CM | POA: Diagnosis not present

## 2012-09-04 DIAGNOSIS — Z7901 Long term (current) use of anticoagulants: Secondary | ICD-10-CM | POA: Diagnosis not present

## 2012-09-04 DIAGNOSIS — Z79899 Other long term (current) drug therapy: Secondary | ICD-10-CM | POA: Diagnosis not present

## 2012-09-04 DIAGNOSIS — S0993XA Unspecified injury of face, initial encounter: Secondary | ICD-10-CM | POA: Diagnosis not present

## 2012-09-04 DIAGNOSIS — Z8719 Personal history of other diseases of the digestive system: Secondary | ICD-10-CM | POA: Diagnosis not present

## 2012-09-04 DIAGNOSIS — S4980XA Other specified injuries of shoulder and upper arm, unspecified arm, initial encounter: Secondary | ICD-10-CM | POA: Diagnosis not present

## 2012-09-04 DIAGNOSIS — Z8669 Personal history of other diseases of the nervous system and sense organs: Secondary | ICD-10-CM | POA: Diagnosis not present

## 2012-09-04 DIAGNOSIS — Z86718 Personal history of other venous thrombosis and embolism: Secondary | ICD-10-CM | POA: Insufficient documentation

## 2012-09-04 DIAGNOSIS — Z23 Encounter for immunization: Secondary | ICD-10-CM | POA: Diagnosis not present

## 2012-09-04 DIAGNOSIS — F3289 Other specified depressive episodes: Secondary | ICD-10-CM | POA: Insufficient documentation

## 2012-09-04 DIAGNOSIS — Z85038 Personal history of other malignant neoplasm of large intestine: Secondary | ICD-10-CM | POA: Diagnosis not present

## 2012-09-04 DIAGNOSIS — Y92009 Unspecified place in unspecified non-institutional (private) residence as the place of occurrence of the external cause: Secondary | ICD-10-CM | POA: Insufficient documentation

## 2012-09-04 DIAGNOSIS — Z8739 Personal history of other diseases of the musculoskeletal system and connective tissue: Secondary | ICD-10-CM | POA: Diagnosis not present

## 2012-09-04 DIAGNOSIS — M545 Low back pain: Secondary | ICD-10-CM | POA: Diagnosis not present

## 2012-09-04 DIAGNOSIS — W1809XA Striking against other object with subsequent fall, initial encounter: Secondary | ICD-10-CM | POA: Insufficient documentation

## 2012-09-04 DIAGNOSIS — Y9389 Activity, other specified: Secondary | ICD-10-CM | POA: Insufficient documentation

## 2012-09-04 LAB — URINALYSIS, ROUTINE W REFLEX MICROSCOPIC
Bilirubin Urine: NEGATIVE
Ketones, ur: NEGATIVE mg/dL
Leukocytes, UA: NEGATIVE
Nitrite: NEGATIVE
Specific Gravity, Urine: 1.014 (ref 1.005–1.030)
Urobilinogen, UA: 0.2 mg/dL (ref 0.0–1.0)

## 2012-09-04 LAB — CBC WITH DIFFERENTIAL/PLATELET
Lymphocytes Relative: 13 % (ref 12–46)
Lymphs Abs: 1.2 10*3/uL (ref 0.7–4.0)
Neutrophils Relative %: 80 % — ABNORMAL HIGH (ref 43–77)
Platelets: 234 10*3/uL (ref 150–400)
RBC: 2.82 MIL/uL — ABNORMAL LOW (ref 4.22–5.81)
WBC: 9.7 10*3/uL (ref 4.0–10.5)

## 2012-09-04 LAB — PROTIME-INR: INR: 0.97 (ref 0.00–1.49)

## 2012-09-04 MED ORDER — MORPHINE SULFATE 4 MG/ML IJ SOLN
4.0000 mg | Freq: Once | INTRAMUSCULAR | Status: AC
  Administered 2012-09-04: 4 mg via INTRAVENOUS
  Filled 2012-09-04: qty 1

## 2012-09-04 MED ORDER — LIDOCAINE-EPINEPHRINE (PF) 2 %-1:200000 IJ SOLN: 10.0000 mL | Freq: Once | INTRAMUSCULAR | Status: DC

## 2012-09-04 MED ORDER — ONDANSETRON HCL 4 MG/2ML IJ SOLN
4.0000 mg | Freq: Once | INTRAMUSCULAR | Status: AC
Start: 2012-09-04 — End: 2012-09-04
  Administered 2012-09-04: 4 mg via INTRAVENOUS
  Filled 2012-09-04: qty 2

## 2012-09-04 MED ORDER — TETANUS-DIPHTH-ACELL PERTUSSIS 5-2.5-18.5 LF-MCG/0.5 IM SUSP
0.5000 mL | Freq: Once | INTRAMUSCULAR | Status: AC
  Administered 2012-09-04: 0.5 mL via INTRAMUSCULAR
  Filled 2012-09-04: qty 0.5

## 2012-09-04 MED ORDER — ACETAMINOPHEN 500 MG PO TABS: 500.0000 mg | ORAL_TABLET | Freq: Four times a day (QID) | ORAL | Status: DC | PRN

## 2012-09-04 NOTE — ED Notes (Signed)
Pt walked to restroom with one assist, no dizziness or difficultly walking. Pt states he felt a little wobbly.

## 2012-09-04 NOTE — Telephone Encounter (Signed)
Patient's wife informed

## 2012-09-04 NOTE — ED Notes (Signed)
Pt states walking up the steps in the garage and fell backwards, denies loc, c/o rt elbow skin tare

## 2012-09-04 NOTE — Telephone Encounter (Signed)
Needs to go to ER to make sure no bleeding in the brain,

## 2012-09-04 NOTE — Telephone Encounter (Signed)
The patient fell backwards in the garage today and hit his head.  He has head and neck pain.  The wife would like to know what to watch for, ER or OV.  Please advise

## 2012-09-04 NOTE — ED Notes (Signed)
Patient transported to X-ray 

## 2012-09-04 NOTE — ED Provider Notes (Signed)
Medical screening examination/treatment/procedure(s) were performed by non-physician practitioner and as supervising physician I was immediately available for consultation/collaboration.  Flint Melter, MD 09/04/12 2236

## 2012-09-04 NOTE — ED Provider Notes (Signed)
History    CSN: [id number] Arrival date & time 09/04/12  1611  First MD Initiated Contact with Patient 09/04/12 1620     Chief Complaint  Patient presents with  . Fall  . Back Pain   (Consider location/radiation/quality/duration/timing/severity/associated sxs/prior Treatment) HPI  79 year-old male with multiple comorbid diseases including diabetes, osteoporosis, dementia presents for evaluation of a recent fall. Patient reports he has had recurrent fall the past month. States he has fallen 3 times in the past 3 weeks. The first time was in his doctor's office, the second time was also at another doctor's office and today he fell after trying to close his refrigerator door. States prior to falling his leg was shaky which is the same symptoms he had had from prior falls. He denies any other precipitating symptoms. He fell backward onto concrete floor hitting the back of his head and currently complaining of pain to his neck and his upper back. Fall was witnessed by wife. She also notice a skin tear to his right elbow although the patient denies any significant pain to his right elbow. Patient currently denies any fever, lightheadedness, dizziness, headache, chest pain, shortness of breath, dysuria, abdominal pain, or rash. He was taken off his Coumadin a week ago due to recurrent fall.  Past Medical History  Diagnosis Date  . Diabetes mellitus type II   . Hyperlipidemia   . Hypertension   . Osteoporosis   . Prostatic hypertrophy     benign  . OSA (obstructive sleep apnea)   . Rotator cuff syndrome     chronic  . Elevated PSA   . Hypothyroidism   . Nephrolithiasis   . Thyroid nodule   . GERD (gastroesophageal reflux disease)   . Colitis, Clostridium difficile     presumed 11/08  . Dementia   . Anemia     NOS  . Vitamin B 12 deficiency   . BRADYCARDIA 06/10/2009  . COLON CANCER, HX OF 09/20/2006  . HYPOTHYROIDISM 01/14/2007  . DIABETES MELLITUS, TYPE II 09/20/2006  .  HYPERLIPIDEMIA 09/20/2006  . ANEMIA-NOS 06/23/2008  . DEMENTIA 05/14/2007  . DEPRESSION 02/27/2007  . SLEEP APNEA, OBSTRUCTIVE 01/14/2007  . HYPERTENSION 09/20/2006  . GERD 01/14/2007  . OSTEOPOROSIS 10/08/2006  . PERIPHERAL EDEMA 09/03/2007  . RENAL INSUFFICIENCY 04/01/2010  . B12 deficiency 05/25/2010  . CLOSTRIDIUM DIFFICILE COLITIS 01/30/2007  . THYROID NODULE 01/14/2007  . CONSTIPATION, RECURRENT 01/06/2010  . BENIGN PROSTATIC HYPERTROPHY 10/08/2006  . ARTHRITIS 02/23/2007  . BACK PAIN, THORACIC REGION 06/04/2008  . COLONIC POLYPS, HX OF 10/08/2006  . NEPHROLITHIASIS, HX OF 01/14/2007  . Cancer of colon dx'd 1998  . Cancer of vocal cord dx'd 1998  . 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 DVT of lower limb, acute 01/12/2011   Past Surgical History  Procedure Laterality Date  . Cholecystectomy  1999  . Inguinal herniorrhapy  1984  . Appendectomy  1998  . Colon resection  1998   Family History  Problem Relation Age of Onset  . Diabetes Mother   . Stroke Father    History  Substance Use Topics  . Smoking status: Never Smoker   . Smokeless tobacco: Never Used  . Alcohol Use: No    Review of Systems  All other systems reviewed and are negative.    Allergies  Biaxin; Other; and Sulfa antibiotics  Home Medications   Current Outpatient Rx  Name  Route  Sig  Dispense  Refill  . buPROPion (WELLBUTRIN XL) 150 MG  24 hr tablet   Oral   Take 150 mg by mouth every morning.         . Cholecalciferol (VITAMIN D-3 PO)   Oral   Take by mouth daily.         . citalopram (CELEXA) 20 MG tablet   Oral   Take 20 mg by mouth daily.         Marland Kitchen docusate sodium (COLACE) 100 MG capsule   Oral   Take 100 mg by mouth at bedtime.           . fenofibrate 160 MG tablet   Oral   Take 160 mg by mouth daily.         . ferrous sulfate 325 (65 FE) MG tablet   Oral   Take 325 mg by mouth daily.         . furosemide (LASIX) 20 MG tablet   Oral   Take 40 mg by mouth daily.          . hydrALAZINE (APRESOLINE) 25 MG tablet      Take 1/2 of the 25 mg by mouth tid         . lansoprazole (PREVACID) 30 MG capsule   Oral   Take 30 mg by mouth daily.         Marland Kitchen levothyroxine (SYNTHROID, LEVOTHROID) 100 MCG tablet   Oral   Take 100 mcg by mouth daily before breakfast.         . lipase/protease/amylase (CREON-12/PANCREASE) 12000 UNITS CPEP   Oral   Take 1-2 capsules by mouth 3 (three) times daily with meals.         Marland Kitchen OLANZapine (ZYPREXA) 5 MG tablet   Oral   Take 5 mg by mouth daily.         . sitaGLIPtin (JANUVIA) 50 MG tablet   Oral   Take 50 mg by mouth daily.         . tamsulosin (FLOMAX) 0.4 MG CAPS   Oral   Take 0.4 mg by mouth at bedtime.         . vitamin B-12 (CYANOCOBALAMIN) 100 MCG tablet   Oral   Take 50 mcg by mouth daily.         Marland Kitchen warfarin (COUMADIN) 5 MG tablet   Oral   Take 5 mg by mouth daily at 6 PM. Takes 5mg  on wed and sat each week Takes 2.5mg  all other days          BP 149/64  Pulse 75  Temp(Src) 98.1 F (36.7 C) (Oral)  Resp 14  SpO2 99% Physical Exam  Nursing note and vitals reviewed. Constitutional: He appears well-developed and well-nourished. No distress.  HENT:  Head: Normocephalic and atraumatic.  Right Ear: External ear normal.  Left Ear: External ear normal.  Eyes: Conjunctivae and EOM are normal. Pupils are equal, round, and reactive to light.  Neck: Neck supple.  Cardiovascular: Normal rate and regular rhythm.   Pulmonary/Chest: Effort normal and breath sounds normal.  Abdominal: Soft. There is no tenderness.  Musculoskeletal: He exhibits tenderness (Tenderness along cervical and thoracic spine on palpation without crepitus or step-off noted. Mild tenderness to dorsal aspects of right elbow with obvious skin tear but no deformity and normal range of motion.).  Neurological: He is alert. No cranial nerve deficit. GCS eye subscore is 4. GCS verbal subscore is 5. GCS motor subscore is 6.   Skin: Skin is warm. No rash noted.  Psychiatric: He  has a normal mood and affect.    ED Course  Procedures (including critical care time)   Date: 09/04/2012  Rate: 56  Rhythm: normal sinus rhythm  QRS Axis: left  Intervals: normal  ST/T Wave abnormalities: normal  Conduction Disutrbances:LVH  Narrative Interpretation:   Old EKG Reviewed: unchanged    4:47 PM Pt with recurrent falls.  Will obtain CT and xrays.  Pain medication given.  Unsure if fall is mechanical but since pt reports his legs being shaky, causing him to fall i suspect it is mechanical.  Work up initiated.  Pain medication given.  Care discussed with attending.  A cervical collar placed for stability and support.  6:06 PM Xrays and CT shows no acute fx or dislocation.  Pt is not UTD with tdap, will update.  Will have pt ambulate. Dressed skin tear on R elbow, and will consult with PCP for close follow up if pt able to ambulate.    7:08 PM Patient was able to ambulate without assist. I have consult with patient's PCP for close followup. Patient is stable for discharge. Pain is well-controlled. Return precautions discussed.  Labs Reviewed  CBC WITH DIFFERENTIAL - Abnormal; Notable for the following:    RBC 2.82 (*)    Hemoglobin 9.3 (*)    HCT 28.0 (*)    Neutrophils Relative % 80 (*)    All other components within normal limits  PROTIME-INR  URINALYSIS, ROUTINE W REFLEX MICROSCOPIC   Dg Thoracic Spine 2 View  09/04/2012   *RADIOLOGY REPORT*  Clinical Data: Mid to lower back pain following a fall today.  THORACIC SPINE - 2 VIEW  Comparison: Chest radiographs dated 07/10/2012.  Findings: Mild dextroconvex rotary scoliosis.  Multilevel degenerative changes.  No fracture or subluxation.  Cholecystectomy clips.  IMPRESSION:  1.  No fracture or subluxation. 2.  Degenerative changes and mild scoliosis.   Original Report Authenticated By: Beckie Salts, M.D.   Dg Lumbar Spine Complete  09/04/2012   *RADIOLOGY REPORT*   Clinical Data: Mid to lower back pain following a fall today.  LUMBAR SPINE - COMPLETE 4+ VIEW  Comparison: None.  Findings: Five non-rib bearing lumbar vertebrae.  Mild levoconvex rotary scoliosis.  Anterior spur formation at multiple levels.  No fractures, subluxations or pars defects.  Lower lumbar spine facet degenerative changes.  Cholecystectomy clips.  IMPRESSION:  1.  No fracture or subluxation. 2.  Degenerative changes and mild scoliosis.   Original Report Authenticated By: Beckie Salts, M.D.   Dg Elbow Complete Right  09/04/2012   *RADIOLOGY REPORT*  Clinical Data: Fall  RIGHT ELBOW - COMPLETE 3+ VIEW  Comparison: None.  Findings: Irregularity at the lateral epicondyle is consistent with chronic degenerative changes.  There is no acute fracture or dislocation.  No joint effusion.  Prominent degenerative spur is seen at the olecranon.  Osseous mineralization is normal.  Soft tissue irregularity the a few scattered foci of subcutaneous emphysema at the posterior elbow is suggestive of soft tissue laceration. No radiopaque foreign body.  IMPRESSION:  1.  No acute fracture or dislocation. 2.  Soft tissue irregularity at the posterior elbow with scattered foci of subcutaneous emphysema, suggestive of soft tissue laceration. 3.  Prominent olecranon spur.   Original Report Authenticated By: Rise Mu, M.D.   Ct Head Wo Contrast  09/04/2012   *RADIOLOGY REPORT*  Clinical Data:  Fall, back pain  CT HEAD WITHOUT CONTRAST CT CERVICAL SPINE WITHOUT CONTRAST  Technique:  Multidetector CT imaging of the head  and cervical spine was performed following the standard protocol without intravenous contrast.  Multiplanar CT image reconstructions of the cervical spine were also generated.  Comparison:  Prior radiograph from 01/06/2010  CT HEAD  Findings: Diffuse prominence of the CSF containing spaces was consistent with generalized atrophy.  There is no midline shift or mass lesion.  No acute intracranial  hemorrhage or infarct. Scattered and confluent hypodensity within the periventricular white matter is consistent with chronic small vessel ischemic changes.  No extra-axial fluid collection.  Calvarium is intact. Mild circumferential mucosal thickening is present within the maxillary sinus on the left.  Mastoid air cells are clear.  IMPRESSION: Atrophy with chronic small vessel ischemic changes.  No acute intracranial process.  CT CERVICAL SPINE  Findings: There is no acute fracture or subluxation.  Trace anterolisthesis of C4 and C5 is present, likely chronic in nature. There is no prevertebral soft tissue swelling.  Prominent multilevel degenerative facet arthrosis is present throughout the cervical spine, most prominent at the C6-7 level on the left.  The normal C1-C2 articulations are intact.  Irregular biapical pleural scarring is noted, right greater than left.  IMPRESSION: 1.  No CT evidence of acute fracture or listhesis. 2.  Multilevel degenerative disc disease as above.   Original Report Authenticated By: Rise Mu, M.D.   Ct Cervical Spine Wo Contrast  09/04/2012   *RADIOLOGY REPORT*  Clinical Data:  Fall, back pain  CT HEAD WITHOUT CONTRAST CT CERVICAL SPINE WITHOUT CONTRAST  Technique:  Multidetector CT imaging of the head and cervical spine was performed following the standard protocol without intravenous contrast.  Multiplanar CT image reconstructions of the cervical spine were also generated.  Comparison:  Prior radiograph from 01/06/2010  CT HEAD  Findings: Diffuse prominence of the CSF containing spaces was consistent with generalized atrophy.  There is no midline shift or mass lesion.  No acute intracranial hemorrhage or infarct. Scattered and confluent hypodensity within the periventricular white matter is consistent with chronic small vessel ischemic changes.  No extra-axial fluid collection.  Calvarium is intact. Mild circumferential mucosal thickening is present within the  maxillary sinus on the left.  Mastoid air cells are clear.  IMPRESSION: Atrophy with chronic small vessel ischemic changes.  No acute intracranial process.  CT CERVICAL SPINE  Findings: There is no acute fracture or subluxation.  Trace anterolisthesis of C4 and C5 is present, likely chronic in nature. There is no prevertebral soft tissue swelling.  Prominent multilevel degenerative facet arthrosis is present throughout the cervical spine, most prominent at the C6-7 level on the left.  The normal C1-C2 articulations are intact.  Irregular biapical pleural scarring is noted, right greater than left.  IMPRESSION: 1.  No CT evidence of acute fracture or listhesis. 2.  Multilevel degenerative disc disease as above.   Original Report Authenticated By: Rise Mu, M.D.   1. Fall at home, initial encounter   2. Back pain     MDM  BP 154/56  Pulse 56  Temp(Src) 98.1 F (36.7 C) (Oral)  Resp 15  SpO2 100%  I have reviewed nursing notes and vital signs. I personally reviewed the imaging tests through PACS system  I reviewed available ER/hospitalization records thought the EMR     Fayrene Helper, [place] 09/04/12 1936

## 2012-09-04 NOTE — ED Notes (Addendum)
Pt states with movement of head the pain moves to right shoulder and stays in the mid area of back as well. Pt states as well as wife that pt has been falling a lot lately.

## 2012-09-05 ENCOUNTER — Encounter: Payer: Self-pay | Admitting: Internal Medicine

## 2012-09-05 ENCOUNTER — Ambulatory Visit (INDEPENDENT_AMBULATORY_CARE_PROVIDER_SITE_OTHER): Payer: Medicare Other | Admitting: Internal Medicine

## 2012-09-05 VITALS — BP 132/60 | HR 74 | Temp 98.1°F | Ht 69.0 in | Wt 155.1 lb

## 2012-09-05 DIAGNOSIS — D649 Anemia, unspecified: Secondary | ICD-10-CM

## 2012-09-05 DIAGNOSIS — R296 Repeated falls: Secondary | ICD-10-CM

## 2012-09-05 DIAGNOSIS — Z9181 History of falling: Secondary | ICD-10-CM | POA: Diagnosis not present

## 2012-09-05 DIAGNOSIS — I10 Essential (primary) hypertension: Secondary | ICD-10-CM

## 2012-09-05 NOTE — Patient Instructions (Signed)
Please continue all other medications as before, and refills have been done if requested. You will be contacted regarding the referral for: Physical Therapy  Please return in 1 week, or sooner if needed, as we will need to re-check your blood work

## 2012-09-05 NOTE — Progress Notes (Signed)
Subjective:    Patient ID: Nathan Blackwell, male    DOB: 12-Nov-1920, 79 y.o.   MRN: [id number]  HPI  Here after being seen in ER after his third fall in 1 mo, fortunately now off anticoagulation, this time with what sounds like retropulsion with walking with his walker.  Struck back of head and neck, seen in ER - ct head neg, ct cervical neg.  Labs with surprising Hgb lower again now in the 9 range.  No overt bleeding or bruising.  Denies orthostasis, but cont's to walk with walker, c/o general weakness.  Pt denies chest pain, increased sob or doe, wheezing, orthopnea, PND, increased LE swelling, palpitations, dizziness or syncope. Fortunately no post head swelling, and neck pain resolved.  C/o mid thoracic pain but minor to him, does not think need further eval such as plain film.  Has abrasion to right elbow but achy pain improved, no fever. No other acute complaints. Wife remains supportive Past Medical History  Diagnosis Date  . Diabetes mellitus type II   . Hyperlipidemia   . Hypertension   . Osteoporosis   . Prostatic hypertrophy     benign  . OSA (obstructive sleep apnea)   . Rotator cuff syndrome     chronic  . Elevated PSA   . Hypothyroidism   . Nephrolithiasis   . Thyroid nodule   . GERD (gastroesophageal reflux disease)   . Colitis, Clostridium difficile     presumed 11/08  . Dementia   . Anemia     NOS  . Vitamin B 12 deficiency   . BRADYCARDIA 06/10/2009  . COLON CANCER, HX OF 09/20/2006  . HYPOTHYROIDISM 01/14/2007  . DIABETES MELLITUS, TYPE II 09/20/2006  . HYPERLIPIDEMIA 09/20/2006  . ANEMIA-NOS 06/23/2008  . DEMENTIA 05/14/2007  . DEPRESSION 02/27/2007  . SLEEP APNEA, OBSTRUCTIVE 01/14/2007  . HYPERTENSION 09/20/2006  . GERD 01/14/2007  . OSTEOPOROSIS 10/08/2006  . PERIPHERAL EDEMA 09/03/2007  . RENAL INSUFFICIENCY 04/01/2010  . B12 deficiency 05/25/2010  . CLOSTRIDIUM DIFFICILE COLITIS 01/30/2007  . THYROID NODULE 01/14/2007  . CONSTIPATION, RECURRENT 01/06/2010  .  BENIGN PROSTATIC HYPERTROPHY 10/08/2006  . ARTHRITIS 02/23/2007  . BACK PAIN, THORACIC REGION 06/04/2008  . COLONIC POLYPS, HX OF 10/08/2006  . NEPHROLITHIASIS, HX OF 01/14/2007  . Cancer of colon dx'd 1998  . Cancer of vocal cord dx'd 1998  . 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 DVT of lower limb, acute 01/12/2011   Past Surgical History  Procedure Laterality Date  . Cholecystectomy  1999  . Inguinal herniorrhapy  1984  . Appendectomy  1998  . Colon resection  1998    reports that he has never smoked. He has never used smokeless tobacco. He reports that he does not drink alcohol or use illicit drugs. family history includes Diabetes in his mother and Stroke in his father. Allergies  Allergen Reactions  . Biaxin (Clarithromycin) Other (See Comments)    Unknown   . Other Other (See Comments)    Nephrologist has told patient not to eat many different foods (tomatoes, Green Foods, etc).   . Sulfa Antibiotics Other (See Comments)    Unknown    Current Outpatient Prescriptions on File Prior to Visit  Medication Sig Dispense Refill  . acetaminophen (TYLENOL) 500 MG tablet Take 1 tablet (500 mg total) by mouth every 6 (six) hours as needed for pain.  30 tablet  0  . buPROPion (WELLBUTRIN XL) 150 MG 24 hr tablet Take 150 mg by mouth every  morning.      . Cholecalciferol (VITAMIN D-3 PO) Take by mouth daily.      . citalopram (CELEXA) 20 MG tablet Take 20 mg by mouth daily.      Marland Kitchen docusate sodium (COLACE) 100 MG capsule Take 100 mg by mouth at bedtime.        . fenofibrate 160 MG tablet Take 160 mg by mouth daily.      . ferrous sulfate 325 (65 FE) MG tablet Take 325 mg by mouth daily.      . furosemide (LASIX) 20 MG tablet Take 40 mg by mouth daily.      . hydrALAZINE (APRESOLINE) 25 MG tablet Take 12.5 mg by mouth 3 (three) times daily. Take 1/2 of the 25 mg by mouth tid      . lansoprazole (PREVACID) 30 MG capsule Take 30 mg by mouth daily.      Marland Kitchen levothyroxine (SYNTHROID, LEVOTHROID)  100 MCG tablet Take 100 mcg by mouth daily before breakfast.      . lipase/protease/amylase (CREON-12/PANCREASE) 12000 UNITS CPEP Take 1-2 capsules by mouth 3 (three) times daily with meals.      Marland Kitchen OLANZapine (ZYPREXA) 5 MG tablet Take 5 mg by mouth daily.      . sitaGLIPtin (JANUVIA) 50 MG tablet Take 50 mg by mouth daily.      . tamsulosin (FLOMAX) 0.4 MG CAPS Take 0.4 mg by mouth at bedtime.      . vitamin B-12 (CYANOCOBALAMIN) 100 MCG tablet Take 50 mcg by mouth daily.       No current facility-administered medications on file prior to visit.   Review of Systems  Constitutional: Negative for unexpected weight change, or unusual diaphoresis  HENT: Negative for tinnitus.   Eyes: Negative for photophobia and visual disturbance.  Respiratory: Negative for choking and stridor.   Gastrointestinal: Negative for vomiting and blood in stool.  Genitourinary: Negative for hematuria and decreased urine volume.  Musculoskeletal: Negative for acute joint swelling Skin: Negative for color change and wound.  Neurological: Negative for tremors and numbness other than noted  Psychiatric/Behavioral: Negative for decreased concentration or  hyperactivity.       Objective:   Physical Exam BP 132/60  Pulse 74  Temp(Src) 98.1 F (36.7 C) (Oral)  Ht 5\' 9"  (1.753 m)  Wt 155 lb 2 oz (70.364 kg)  BMI 22.9 kg/m2  SpO2 96% VS noted,  Constitutional: Pt appears well-developed and well-nourished.  HENT: Head: NCAT.  Right Ear: External ear normal.  Left Ear: External ear normal.  Eyes: Conjunctivae and EOM are normal. Pupils are equal, round, and reactive to light.  Neck: Normal range of motion. Neck supple. NT  Cardiovascular: Normal rate and regular rhythm.   Pulmonary/Chest: Effort normal and breath sounds normal.  Abd:  Soft, NT, non-distended, + BS Spine: nontender Neurological: Pt is alert. Has baseline confused, motor 5/5 though general weakness, stands and up on exam table without assist,  uses rolling walker now  Skin: Skin is warm. No erythema. Right elbow NT under large bandage not removed today, no red streaks, has FROM Psychiatric: Pt behavior is normal.    Assessment & Plan:

## 2012-09-06 NOTE — Assessment & Plan Note (Signed)
?   Mild worsening, for f/u. Next visit Lab Results  Component Value Date   WBC 9.7 09/04/2012   HGB 9.3* 09/04/2012   HCT 28.0* 09/04/2012   MCV 99.3 09/04/2012   PLT 234 09/04/2012

## 2012-09-06 NOTE — Assessment & Plan Note (Addendum)
3rd fall in one mo, for PT eval and tx  Note:  Total time for pt hx, exam, review of record with pt in the room, determination of diagnoses and plan for further eval and tx is > 40 min, with over 50% spent in coordination and counseling of patient

## 2012-09-06 NOTE — Assessment & Plan Note (Signed)
stable overall by history and exam, recent data reviewed with pt, and pt to continue medical treatment as before,  to f/u any worsening symptoms or concerns BP Readings from Last 3 Encounters:  09/05/12 132/60  09/04/12 154/56  08/12/12 120/58

## 2012-09-07 ENCOUNTER — Other Ambulatory Visit: Payer: Self-pay | Admitting: Internal Medicine

## 2012-09-10 ENCOUNTER — Other Ambulatory Visit (HOSPITAL_COMMUNITY): Payer: Self-pay | Admitting: *Deleted

## 2012-09-10 ENCOUNTER — Encounter (HOSPITAL_COMMUNITY): Payer: Medicare Other

## 2012-09-11 ENCOUNTER — Encounter (HOSPITAL_COMMUNITY)
Admission: RE | Admit: 2012-09-11 | Discharge: 2012-09-11 | Disposition: A | Discharge status: Home / Self Care | Payer: Medicare Other | Source: Ambulatory Visit | Attending: Nephrology | Admitting: Nephrology

## 2012-09-11 DIAGNOSIS — D649 Anemia, unspecified: Secondary | ICD-10-CM | POA: Insufficient documentation

## 2012-09-11 DIAGNOSIS — I129 Hypertensive chronic kidney disease with stage 1 through stage 4 chronic kidney disease, or unspecified chronic kidney disease: Secondary | ICD-10-CM | POA: Insufficient documentation

## 2012-09-11 DIAGNOSIS — N184 Chronic kidney disease, stage 4 (severe): Secondary | ICD-10-CM | POA: Diagnosis not present

## 2012-09-11 MED ORDER — EPOETIN ALFA 10000 UNIT/ML IJ SOLN
10000.0000 [IU] | INTRAMUSCULAR | Status: DC
  Administered 2012-09-11: 10000 [IU] via SUBCUTANEOUS

## 2012-09-11 MED ORDER — EPOETIN ALFA 10000 UNIT/ML IJ SOLN
INTRAMUSCULAR | Status: AC
  Filled 2012-09-11: qty 1

## 2012-09-12 ENCOUNTER — Ambulatory Visit (INDEPENDENT_AMBULATORY_CARE_PROVIDER_SITE_OTHER): Payer: Medicare Other | Admitting: Internal Medicine

## 2012-09-12 ENCOUNTER — Encounter: Payer: Self-pay | Admitting: Internal Medicine

## 2012-09-12 ENCOUNTER — Encounter: Payer: Self-pay | Admitting: Nephrology

## 2012-09-12 VITALS — BP 140/72 | HR 69 | Temp 97.9°F | Ht 69.0 in | Wt 154.4 lb

## 2012-09-12 DIAGNOSIS — I10 Essential (primary) hypertension: Secondary | ICD-10-CM

## 2012-09-12 DIAGNOSIS — E538 Deficiency of other specified B group vitamins: Secondary | ICD-10-CM

## 2012-09-12 DIAGNOSIS — R296 Repeated falls: Secondary | ICD-10-CM

## 2012-09-12 DIAGNOSIS — D649 Anemia, unspecified: Secondary | ICD-10-CM

## 2012-09-12 DIAGNOSIS — Z9181 History of falling: Secondary | ICD-10-CM

## 2012-09-12 MED ORDER — CYANOCOBALAMIN 1000 MCG/ML IJ SOLN
1000.0000 ug | Freq: Once | INTRAMUSCULAR | Status: AC
  Administered 2012-09-12: 1000 ug via INTRAMUSCULAR

## 2012-09-12 NOTE — Patient Instructions (Signed)
You had the B12 shot today No further blood lab work needed today You will be contacted regarding the referral for: Physical Therapy soon Please continue all other medications as before, and refills have been done if requested. Please have the pharmacy call with any other refills you may need. Please keep your appointments with your specialists as you have planned, including the shot every 2 wks  Please remember to sign up for My Chart if you have not done so, as this will be important to you in the future with finding out test results, communicating by private email, and scheduling acute appointments online when needed.  Please return in 3 months, or sooner if needed

## 2012-09-12 NOTE — Assessment & Plan Note (Signed)
Hopefully to start PT soon

## 2012-09-12 NOTE — Assessment & Plan Note (Signed)
stable overall by history and exam, recent data reviewed with pt, and pt to continue medical treatment as before,  to f/u any worsening symptoms or concerns, no need further lab work today

## 2012-09-12 NOTE — Addendum Note (Signed)
Addended by: Scharlene Gloss B on: 09/12/2012 10:48 AM   Modules accepted: Orders

## 2012-09-12 NOTE — Assessment & Plan Note (Signed)
stable overall by history and exam, recent data reviewed with pt, and pt to continue medical treatment as before,  to f/u any worsening symptoms or concerns BP Readings from Last 3 Encounters:  09/12/12 140/72  09/11/12 138/68  09/05/12 132/60

## 2012-09-12 NOTE — Progress Notes (Signed)
Subjective:    Patient ID: Nathan Blackwell, male    DOB: 10/28/1920, 79 y.o.   MRN: [id number]  HPI  Here to f/u, has high risk and fear of falling, almost fell again yesterday when walking fast in the parking lot trying to keep up with a man helping them after epo like shot yesterday with hgb 10.2., with planned f/u q 2 wks. Not yet started PT.  No overt bleeding or bruising. Still sore to lower back from the last visit, still with tender bruise at the coccyx.   Pt denies fever, wt loss, night sweats, loss of appetite, or other constitutional symptoms Cant tolerate the b12 pills, so wants to re-start the shots.  Right elbow abrasion essentially healed.   Past Medical History  Diagnosis Date  . Diabetes mellitus type II   . Hyperlipidemia   . Hypertension   . Osteoporosis   . Prostatic hypertrophy     benign  . OSA (obstructive sleep apnea)   . Rotator cuff syndrome     chronic  . Elevated PSA   . Hypothyroidism   . Nephrolithiasis   . Thyroid nodule   . GERD (gastroesophageal reflux disease)   . Colitis, Clostridium difficile     presumed 11/08  . Dementia   . Anemia     NOS  . Vitamin B 12 deficiency   . BRADYCARDIA 06/10/2009  . COLON CANCER, HX OF 09/20/2006  . HYPOTHYROIDISM 01/14/2007  . DIABETES MELLITUS, TYPE II 09/20/2006  . HYPERLIPIDEMIA 09/20/2006  . ANEMIA-NOS 06/23/2008  . DEMENTIA 05/14/2007  . DEPRESSION 02/27/2007  . SLEEP APNEA, OBSTRUCTIVE 01/14/2007  . HYPERTENSION 09/20/2006  . GERD 01/14/2007  . OSTEOPOROSIS 10/08/2006  . PERIPHERAL EDEMA 09/03/2007  . RENAL INSUFFICIENCY 04/01/2010  . B12 deficiency 05/25/2010  . CLOSTRIDIUM DIFFICILE COLITIS 01/30/2007  . THYROID NODULE 01/14/2007  . CONSTIPATION, RECURRENT 01/06/2010  . BENIGN PROSTATIC HYPERTROPHY 10/08/2006  . ARTHRITIS 02/23/2007  . BACK PAIN, THORACIC REGION 06/04/2008  . COLONIC POLYPS, HX OF 10/08/2006  . NEPHROLITHIASIS, HX OF 01/14/2007  . Cancer of colon dx'd 1998  . Cancer of vocal cord dx'd 1998  .  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 DVT of lower limb, acute 01/12/2011   Past Surgical History  Procedure Laterality Date  . Cholecystectomy  1999  . Inguinal herniorrhapy  1984  . Appendectomy  1998  . Colon resection  1998    reports that he has never smoked. He has never used smokeless tobacco. He reports that he does not drink alcohol or use illicit drugs. family history includes Diabetes in his mother and Stroke in his father. Allergies  Allergen Reactions  . Biaxin (Clarithromycin) Other (See Comments)    Unknown   . Other Other (See Comments)    Nephrologist has told patient not to eat many different foods (tomatoes, Green Foods, etc).   . Sulfa Antibiotics Other (See Comments)    Unknown    Current Outpatient Prescriptions on File Prior to Visit  Medication Sig Dispense Refill  . acetaminophen (TYLENOL) 500 MG tablet Take 1 tablet (500 mg total) by mouth every 6 (six) hours as needed for pain.  30 tablet  0  . buPROPion (WELLBUTRIN XL) 150 MG 24 hr tablet TAKE 1 TABLET BY MOUTH EVERY DAY  30 tablet  11  . Cholecalciferol (VITAMIN D-3 PO) Take by mouth daily.      . citalopram (CELEXA) 20 MG tablet Take 20 mg by mouth daily.      Marland Kitchen  docusate sodium (COLACE) 100 MG capsule Take 100 mg by mouth at bedtime.        . fenofibrate 160 MG tablet Take 160 mg by mouth daily.      . ferrous sulfate 325 (65 FE) MG tablet Take 325 mg by mouth daily.      . furosemide (LASIX) 20 MG tablet Take 40 mg by mouth daily.      . hydrALAZINE (APRESOLINE) 25 MG tablet Take 12.5 mg by mouth 3 (three) times daily. Take 1/2 of the 25 mg by mouth tid      . lansoprazole (PREVACID) 30 MG capsule Take 30 mg by mouth daily.      Marland Kitchen levothyroxine (SYNTHROID, LEVOTHROID) 100 MCG tablet Take 100 mcg by mouth daily before breakfast.      . lipase/protease/amylase (CREON-12/PANCREASE) 12000 UNITS CPEP Take 1-2 capsules by mouth 3 (three) times daily with meals.      Marland Kitchen OLANZapine (ZYPREXA) 5 MG tablet Take  5 mg by mouth daily.      . sitaGLIPtin (JANUVIA) 50 MG tablet Take 50 mg by mouth daily.      . tamsulosin (FLOMAX) 0.4 MG CAPS Take 0.4 mg by mouth at bedtime.      . vitamin B-12 (CYANOCOBALAMIN) 100 MCG tablet Take 50 mcg by mouth daily.       No current facility-administered medications on file prior to visit.   Review of Systems  Constitutional: Negative for unexpected weight change, or unusual diaphoresis  HENT: Negative for tinnitus.   Eyes: Negative for photophobia and visual disturbance.  Respiratory: Negative for choking and stridor.   Gastrointestinal: Negative for vomiting and blood in stool.  Genitourinary: Negative for hematuria and decreased urine volume.  Musculoskeletal: Negative for acute joint swelling Skin: Negative for color change and wound.  Neurological: Negative for tremors and numbness other than noted  Psychiatric/Behavioral: Negative for decreased concentration or  hyperactivity.       Objective:   Physical Exam BP 140/72  Pulse 69  Temp(Src) 97.9 F (36.6 C) (Oral)  Ht 5\' 9"  (1.753 m)  Wt 154 lb 6 oz (70.024 kg)  BMI 22.79 kg/m2  SpO2 99% VS noted,  Constitutional: Pt appears well-developed and well-nourished.  HENT: Head: NCAT.  Right Ear: External ear normal.  Left Ear: External ear normal.  Eyes: Conjunctivae and EOM are normal. Pupils are equal, round, and reactive to light.  Neck: Normal range of motion. Neck supple.  Cardiovascular: Normal rate and regular rhythm.   Pulmonary/Chest: Effort normal and breath sounds normal.  - no rales or wheezing Neurological: Pt is alert Skin: Skin is warm. No erythema.  Psychiatric: Pt behavior is normal.    Assessment & Plan:

## 2012-09-12 NOTE — Assessment & Plan Note (Signed)
Ok for re-start B12 IM, but every 3 mo likely to be sufficient

## 2012-09-14 ENCOUNTER — Other Ambulatory Visit: Payer: Self-pay | Admitting: Internal Medicine

## 2012-09-16 ENCOUNTER — Ambulatory Visit: Payer: Medicare Other | Attending: Internal Medicine | Admitting: Physical Therapy

## 2012-09-16 DIAGNOSIS — IMO0001 Reserved for inherently not codable concepts without codable children: Secondary | ICD-10-CM | POA: Diagnosis not present

## 2012-09-16 DIAGNOSIS — R269 Unspecified abnormalities of gait and mobility: Secondary | ICD-10-CM | POA: Insufficient documentation

## 2012-09-21 ENCOUNTER — Other Ambulatory Visit: Payer: Self-pay | Admitting: Internal Medicine

## 2012-09-24 ENCOUNTER — Encounter: Payer: Self-pay | Admitting: Pulmonary Disease

## 2012-09-24 ENCOUNTER — Ambulatory Visit (INDEPENDENT_AMBULATORY_CARE_PROVIDER_SITE_OTHER): Payer: Medicare Other | Admitting: Pulmonary Disease

## 2012-09-24 VITALS — BP 124/62 | HR 69 | Temp 98.0°F | Ht 68.5 in | Wt 152.4 lb

## 2012-09-24 DIAGNOSIS — L608 Other nail disorders: Secondary | ICD-10-CM | POA: Diagnosis not present

## 2012-09-24 DIAGNOSIS — E119 Type 2 diabetes mellitus without complications: Secondary | ICD-10-CM | POA: Diagnosis not present

## 2012-09-24 DIAGNOSIS — G4733 Obstructive sleep apnea (adult) (pediatric): Secondary | ICD-10-CM

## 2012-09-24 NOTE — Progress Notes (Signed)
  Subjective:    Patient ID: Nathan Blackwell, male    DOB: 10-18-1920, 79 y.o.   MRN: [id number]  HPI Patient comes in today for followup of his obstructive sleep apnea.  His wife states that he is wearing the device compliantly, and it makes a big difference in his sleep and daytime alertness.  He never got a new mask from the last visit, and is continuing to have skin breakdown over the bridge of the nose.  He tried a silicone pad, but he was not able to keep it on under the mask.  He is having no issues with pressure.   Review of Systems  Constitutional: Positive for unexpected weight change. Negative for fever.  HENT: Negative for ear pain, nosebleeds, congestion, sore throat, rhinorrhea, sneezing, trouble swallowing, dental problem, postnasal drip and sinus pressure.   Eyes: Negative for redness and itching.  Respiratory: Positive for cough. Negative for chest tightness, shortness of breath and wheezing.   Cardiovascular: Positive for leg swelling. Negative for palpitations.  Gastrointestinal: Negative for nausea and vomiting.  Genitourinary: Negative for dysuria.  Musculoskeletal: Negative for joint swelling.  Skin: Negative for rash.  Neurological: Negative for headaches.  Hematological: Does not bruise/bleed easily.  Psychiatric/Behavioral: Negative for dysphoric mood. The patient is not nervous/anxious.        Objective:   Physical Exam Well-developed male in no acute distress Nose with purulent discharge noted, however excoriation noted over the bridge of the nose from the CPAP mask Neck without lymphadenopathy or thyromegaly Lower extremities with mild edema, no cyanosis Alert and oriented, moves all 4 extremities.       Assessment & Plan:

## 2012-09-24 NOTE — Assessment & Plan Note (Signed)
The patient is wearing CPAP compliantly, and feels that it continues to help him.  He is still having issues with mask fit, and I would like to send him to the sleep center for a mask fitting.  He is agreeable to this.  If doing well, we'll see him back in one year.

## 2012-09-24 NOTE — Patient Instructions (Addendum)
Continue on cpap. Will get you an apptm with the sleep center during the day for a mask fitting.  followup with me in one year.

## 2012-09-25 ENCOUNTER — Encounter (HOSPITAL_COMMUNITY)
Admission: RE | Admit: 2012-09-25 | Discharge: 2012-09-25 | Disposition: A | Discharge status: Home / Self Care | Payer: Medicare Other | Source: Ambulatory Visit | Attending: Nephrology | Admitting: Nephrology

## 2012-09-25 LAB — POCT HEMOGLOBIN-HEMACUE: Hemoglobin: 10.9 g/dL — ABNORMAL LOW (ref 13.0–17.0)

## 2012-09-25 MED ORDER — EPOETIN ALFA 10000 UNIT/ML IJ SOLN
10000.0000 [IU] | INTRAMUSCULAR | Status: DC
  Administered 2012-09-25: 10000 [IU] via SUBCUTANEOUS

## 2012-09-25 MED ORDER — EPOETIN ALFA 10000 UNIT/ML IJ SOLN
INTRAMUSCULAR | Status: AC
  Filled 2012-09-25: qty 1

## 2012-09-26 ENCOUNTER — Ambulatory Visit: Payer: Medicare Other | Admitting: Physical Therapy

## 2012-10-03 ENCOUNTER — Ambulatory Visit: Payer: Medicare Other | Admitting: Physical Therapy

## 2012-10-05 ENCOUNTER — Other Ambulatory Visit: Payer: Self-pay | Admitting: Internal Medicine

## 2012-10-07 ENCOUNTER — Ambulatory Visit (HOSPITAL_BASED_OUTPATIENT_CLINIC_OR_DEPARTMENT_OTHER): Payer: Medicare Other | Attending: Pulmonary Disease | Admitting: Radiology

## 2012-10-07 DIAGNOSIS — G4733 Obstructive sleep apnea (adult) (pediatric): Secondary | ICD-10-CM

## 2012-10-09 ENCOUNTER — Ambulatory Visit: Payer: Medicare Other | Attending: Internal Medicine | Admitting: Rehabilitative and Restorative Service Providers"

## 2012-10-09 ENCOUNTER — Ambulatory Visit: Payer: Medicare Other | Admitting: Internal Medicine

## 2012-10-09 DIAGNOSIS — IMO0001 Reserved for inherently not codable concepts without codable children: Secondary | ICD-10-CM | POA: Insufficient documentation

## 2012-10-09 DIAGNOSIS — R269 Unspecified abnormalities of gait and mobility: Secondary | ICD-10-CM | POA: Insufficient documentation

## 2012-10-11 ENCOUNTER — Encounter (HOSPITAL_COMMUNITY)
Admission: RE | Admit: 2012-10-11 | Discharge: 2012-10-11 | Disposition: A | Discharge status: Home / Self Care | Payer: Medicare Other | Source: Ambulatory Visit | Attending: Nephrology | Admitting: Nephrology

## 2012-10-11 DIAGNOSIS — N184 Chronic kidney disease, stage 4 (severe): Secondary | ICD-10-CM | POA: Insufficient documentation

## 2012-10-11 DIAGNOSIS — D649 Anemia, unspecified: Secondary | ICD-10-CM | POA: Insufficient documentation

## 2012-10-11 DIAGNOSIS — I129 Hypertensive chronic kidney disease with stage 1 through stage 4 chronic kidney disease, or unspecified chronic kidney disease: Secondary | ICD-10-CM | POA: Insufficient documentation

## 2012-10-11 LAB — FERRITIN: Ferritin: 200 ng/mL (ref 22–322)

## 2012-10-11 LAB — IRON AND TIBC: UIBC: 235 ug/dL (ref 125–400)

## 2012-10-11 MED ORDER — EPOETIN ALFA 10000 UNIT/ML IJ SOLN
INTRAMUSCULAR | Status: AC
  Filled 2012-10-11: qty 1

## 2012-10-11 MED ORDER — EPOETIN ALFA 10000 UNIT/ML IJ SOLN
10000.0000 [IU] | INTRAMUSCULAR | Status: DC
  Administered 2012-10-11: 10000 [IU] via SUBCUTANEOUS

## 2012-10-14 LAB — POCT HEMOGLOBIN-HEMACUE: Hemoglobin: 11.2 g/dL — ABNORMAL LOW (ref 13.0–17.0)

## 2012-10-17 ENCOUNTER — Ambulatory Visit: Payer: Medicare Other | Admitting: Physical Therapy

## 2012-10-24 ENCOUNTER — Encounter (HOSPITAL_COMMUNITY)
Admission: RE | Admit: 2012-10-24 | Discharge: 2012-10-24 | Disposition: A | Discharge status: Home / Self Care | Payer: Medicare Other | Source: Ambulatory Visit | Attending: Nephrology | Admitting: Nephrology

## 2012-10-24 DIAGNOSIS — I129 Hypertensive chronic kidney disease with stage 1 through stage 4 chronic kidney disease, or unspecified chronic kidney disease: Secondary | ICD-10-CM | POA: Diagnosis not present

## 2012-10-24 DIAGNOSIS — N184 Chronic kidney disease, stage 4 (severe): Secondary | ICD-10-CM | POA: Diagnosis not present

## 2012-10-24 DIAGNOSIS — D649 Anemia, unspecified: Secondary | ICD-10-CM | POA: Diagnosis not present

## 2012-10-24 LAB — POCT HEMOGLOBIN-HEMACUE: Hemoglobin: 12.7 g/dL — ABNORMAL LOW (ref 13.0–17.0)

## 2012-10-24 MED ORDER — EPOETIN ALFA 10000 UNIT/ML IJ SOLN: 10000.0000 [IU] | INTRAMUSCULAR | Status: DC

## 2012-10-28 ENCOUNTER — Ambulatory Visit: Payer: Medicare Other | Admitting: Physical Therapy

## 2012-10-30 DIAGNOSIS — R234 Changes in skin texture: Secondary | ICD-10-CM | POA: Diagnosis not present

## 2012-10-30 DIAGNOSIS — I872 Venous insufficiency (chronic) (peripheral): Secondary | ICD-10-CM | POA: Diagnosis not present

## 2012-10-30 DIAGNOSIS — L570 Actinic keratosis: Secondary | ICD-10-CM | POA: Diagnosis not present

## 2012-11-05 ENCOUNTER — Ambulatory Visit: Payer: Medicare Other | Attending: Internal Medicine | Admitting: Physical Therapy

## 2012-11-05 DIAGNOSIS — R269 Unspecified abnormalities of gait and mobility: Secondary | ICD-10-CM | POA: Insufficient documentation

## 2012-11-05 DIAGNOSIS — IMO0001 Reserved for inherently not codable concepts without codable children: Secondary | ICD-10-CM | POA: Diagnosis not present

## 2012-11-06 ENCOUNTER — Ambulatory Visit: Payer: Medicare Other | Admitting: Physical Therapy

## 2012-11-11 ENCOUNTER — Encounter (HOSPITAL_COMMUNITY)
Admission: RE | Admit: 2012-11-11 | Discharge: 2012-11-11 | Disposition: A | Discharge status: Home / Self Care | Payer: Medicare Other | Source: Ambulatory Visit | Attending: Nephrology | Admitting: Nephrology

## 2012-11-11 DIAGNOSIS — I129 Hypertensive chronic kidney disease with stage 1 through stage 4 chronic kidney disease, or unspecified chronic kidney disease: Secondary | ICD-10-CM | POA: Insufficient documentation

## 2012-11-11 DIAGNOSIS — N184 Chronic kidney disease, stage 4 (severe): Secondary | ICD-10-CM | POA: Diagnosis not present

## 2012-11-11 DIAGNOSIS — D649 Anemia, unspecified: Secondary | ICD-10-CM | POA: Insufficient documentation

## 2012-11-11 LAB — RENAL FUNCTION PANEL
BUN: 23 mg/dL (ref 6–23)
CO2: 26 mEq/L (ref 19–32)
Chloride: 101 mEq/L (ref 96–112)
Creatinine, Ser: 1.9 mg/dL — ABNORMAL HIGH (ref 0.50–1.35)
Glucose, Bld: 125 mg/dL — ABNORMAL HIGH (ref 70–99)

## 2012-11-11 LAB — POCT HEMOGLOBIN-HEMACUE: Hemoglobin: 12.5 g/dL — ABNORMAL LOW (ref 13.0–17.0)

## 2012-11-11 LAB — IRON AND TIBC
Iron: 82 ug/dL (ref 42–135)
UIBC: 261 ug/dL (ref 125–400)

## 2012-11-11 LAB — MAGNESIUM: Magnesium: 2.1 mg/dL (ref 1.5–2.5)

## 2012-11-11 MED ORDER — EPOETIN ALFA 10000 UNIT/ML IJ SOLN: 10000.0000 [IU] | INTRAMUSCULAR | Status: DC

## 2012-11-12 ENCOUNTER — Telehealth: Payer: Self-pay | Admitting: Pulmonary Disease

## 2012-11-12 ENCOUNTER — Ambulatory Visit: Payer: Medicare Other | Admitting: Physical Therapy

## 2012-11-12 DIAGNOSIS — G4733 Obstructive sleep apnea (adult) (pediatric): Secondary | ICD-10-CM

## 2012-11-12 LAB — VITAMIN D 25 HYDROXY (VIT D DEFICIENCY, FRACTURES): Vit D, 25-Hydroxy: 47 ng/mL (ref 30–89)

## 2012-11-12 NOTE — Telephone Encounter (Signed)
Spoke with Marita Kansas at [facility]-- Pt seen at the beginning of August for mask fitting. Pt has not heard from sleep center on which mask should be ordered.  Vernon's note from 8.4.14 visit: The paitient was desensitized to CPAP with the use of a Large Size Res-med Airfit(F10) Full Face Mask.The patient tolerated the desensitization well. The tech will notify [facility] of the mask change... VLB   Dr Shelle Iron please advise on order. Thanks!

## 2012-11-12 NOTE — Telephone Encounter (Signed)
Order was just sent to [facility] for new mask

## 2012-11-12 NOTE — Telephone Encounter (Signed)
Ok to send an order for this mask to [facility], and let pt know we are doing this.  Should check with dme wed or thurs to see if they received order .

## 2012-11-12 NOTE — Telephone Encounter (Signed)
Order was sent to [facility] Pt's spouse aware

## 2012-11-14 ENCOUNTER — Ambulatory Visit: Payer: Medicare Other | Admitting: Physical Therapy

## 2012-11-18 ENCOUNTER — Ambulatory Visit: Payer: Medicare Other | Admitting: Physical Therapy

## 2012-11-20 ENCOUNTER — Ambulatory Visit: Payer: Medicare Other | Admitting: Physical Therapy

## 2012-11-25 ENCOUNTER — Ambulatory Visit: Payer: Medicare Other | Admitting: Physical Therapy

## 2012-11-26 ENCOUNTER — Encounter (HOSPITAL_COMMUNITY)
Admission: RE | Admit: 2012-11-26 | Discharge: 2012-11-26 | Disposition: A | Discharge status: Home / Self Care | Payer: Medicare Other | Source: Ambulatory Visit | Attending: Nephrology | Admitting: Nephrology

## 2012-11-26 DIAGNOSIS — I129 Hypertensive chronic kidney disease with stage 1 through stage 4 chronic kidney disease, or unspecified chronic kidney disease: Secondary | ICD-10-CM | POA: Diagnosis not present

## 2012-11-26 DIAGNOSIS — D649 Anemia, unspecified: Secondary | ICD-10-CM | POA: Diagnosis not present

## 2012-11-26 DIAGNOSIS — N184 Chronic kidney disease, stage 4 (severe): Secondary | ICD-10-CM | POA: Diagnosis not present

## 2012-11-26 DIAGNOSIS — L608 Other nail disorders: Secondary | ICD-10-CM | POA: Diagnosis not present

## 2012-11-26 DIAGNOSIS — E119 Type 2 diabetes mellitus without complications: Secondary | ICD-10-CM | POA: Diagnosis not present

## 2012-11-26 MED ORDER — EPOETIN ALFA 10000 UNIT/ML IJ SOLN: 10000.0000 [IU] | INTRAMUSCULAR | Status: DC

## 2012-11-28 ENCOUNTER — Ambulatory Visit: Payer: Medicare Other | Admitting: Physical Therapy

## 2012-12-02 ENCOUNTER — Ambulatory Visit: Payer: Medicare Other | Admitting: Physical Therapy

## 2012-12-03 DIAGNOSIS — D649 Anemia, unspecified: Secondary | ICD-10-CM | POA: Diagnosis not present

## 2012-12-03 DIAGNOSIS — N2581 Secondary hyperparathyroidism of renal origin: Secondary | ICD-10-CM | POA: Diagnosis not present

## 2012-12-03 DIAGNOSIS — N184 Chronic kidney disease, stage 4 (severe): Secondary | ICD-10-CM | POA: Diagnosis not present

## 2012-12-03 DIAGNOSIS — I129 Hypertensive chronic kidney disease with stage 1 through stage 4 chronic kidney disease, or unspecified chronic kidney disease: Secondary | ICD-10-CM | POA: Diagnosis not present

## 2012-12-04 ENCOUNTER — Ambulatory Visit: Payer: Medicare Other | Attending: Internal Medicine | Admitting: Physical Therapy

## 2012-12-04 DIAGNOSIS — R269 Unspecified abnormalities of gait and mobility: Secondary | ICD-10-CM | POA: Insufficient documentation

## 2012-12-04 DIAGNOSIS — IMO0001 Reserved for inherently not codable concepts without codable children: Secondary | ICD-10-CM | POA: Diagnosis not present

## 2012-12-10 ENCOUNTER — Encounter: Payer: Self-pay | Admitting: Internal Medicine

## 2012-12-10 ENCOUNTER — Encounter: Payer: Self-pay | Admitting: Cardiology

## 2012-12-10 ENCOUNTER — Encounter (HOSPITAL_COMMUNITY): Payer: Medicare Other

## 2012-12-10 ENCOUNTER — Other Ambulatory Visit (HOSPITAL_COMMUNITY): Payer: Self-pay | Admitting: *Deleted

## 2012-12-11 ENCOUNTER — Encounter (HOSPITAL_COMMUNITY)
Admission: RE | Admit: 2012-12-11 | Discharge: 2012-12-11 | Disposition: A | Discharge status: Home / Self Care | Payer: Medicare Other | Source: Ambulatory Visit | Attending: Nephrology | Admitting: Nephrology

## 2012-12-11 DIAGNOSIS — N184 Chronic kidney disease, stage 4 (severe): Secondary | ICD-10-CM | POA: Diagnosis not present

## 2012-12-11 DIAGNOSIS — I129 Hypertensive chronic kidney disease with stage 1 through stage 4 chronic kidney disease, or unspecified chronic kidney disease: Secondary | ICD-10-CM | POA: Diagnosis not present

## 2012-12-11 DIAGNOSIS — D649 Anemia, unspecified: Secondary | ICD-10-CM | POA: Insufficient documentation

## 2012-12-11 LAB — FERRITIN: Ferritin: 176 ng/mL (ref 22–322)

## 2012-12-11 LAB — POCT HEMOGLOBIN-HEMACUE: Hemoglobin: 11.2 g/dL — ABNORMAL LOW (ref 13.0–17.0)

## 2012-12-11 LAB — IRON AND TIBC: Iron: 108 ug/dL (ref 42–135)

## 2012-12-11 MED ORDER — EPOETIN ALFA 10000 UNIT/ML IJ SOLN: 10000.0000 [IU] | INTRAMUSCULAR | Status: DC

## 2012-12-11 MED ORDER — EPOETIN ALFA 10000 UNIT/ML IJ SOLN
INTRAMUSCULAR | Status: AC
  Administered 2012-12-11: 14:00:00 10000 [IU] via SUBCUTANEOUS
  Filled 2012-12-11: qty 1

## 2012-12-17 ENCOUNTER — Encounter: Payer: Self-pay | Admitting: Internal Medicine

## 2012-12-17 ENCOUNTER — Ambulatory Visit (INDEPENDENT_AMBULATORY_CARE_PROVIDER_SITE_OTHER): Payer: Medicare Other | Admitting: Internal Medicine

## 2012-12-17 VITALS — BP 110/62 | HR 82 | Temp 99.0°F | Ht 69.0 in | Wt 147.0 lb

## 2012-12-17 DIAGNOSIS — I10 Essential (primary) hypertension: Secondary | ICD-10-CM

## 2012-12-17 DIAGNOSIS — R2689 Other abnormalities of gait and mobility: Secondary | ICD-10-CM | POA: Insufficient documentation

## 2012-12-17 DIAGNOSIS — Z23 Encounter for immunization: Secondary | ICD-10-CM

## 2012-12-17 DIAGNOSIS — E785 Hyperlipidemia, unspecified: Secondary | ICD-10-CM

## 2012-12-17 DIAGNOSIS — E119 Type 2 diabetes mellitus without complications: Secondary | ICD-10-CM

## 2012-12-17 DIAGNOSIS — R269 Unspecified abnormalities of gait and mobility: Secondary | ICD-10-CM

## 2012-12-17 NOTE — Patient Instructions (Addendum)
You had the flu shot today Please continue all other medications as before, and refills have been done if requested. Please have the pharmacy call with any other refills you may need.  You will be contacted regarding the referral for: neurology for the gait change and ? Of parkinsons  Please keep your appointments with your specialists as you have planned - Dr Patel/renal  Please return in 4 months, or sooner if needed

## 2012-12-17 NOTE — Assessment & Plan Note (Signed)
stable overall by history and exam, recent data reviewed with pt, and pt to continue medical treatment as before,  to f/u any worsening symptoms or concerns BP Readings from Last 3 Encounters:  12/17/12 110/62  12/11/12 149/61  11/26/12 157/60

## 2012-12-17 NOTE — Assessment & Plan Note (Signed)
stable overall by history and exam, recent data reviewed with pt, and pt to continue medical treatment as before,  to f/u any worsening symptoms or concerns Lab Results  Component Value Date   HGBA1C 6.2 01/30/2012    

## 2012-12-17 NOTE — Assessment & Plan Note (Signed)
stable overall by history and exam, recent data reviewed with pt, and pt to continue medical treatment as before,  to f/u any worsening symptoms or concerns Lab Results  Component Value Date   LDLCALC 92 09/21/2011

## 2012-12-17 NOTE — Progress Notes (Signed)
Subjective:    Patient ID: Nathan Blackwell, male    DOB: 1920/12/25, 79 y.o.   MRN: [id number]  HPI  Here to f/u; overall doing ok,  Pt denies chest pain, increased sob or doe, wheezing, orthopnea, PND, increased LE swelling, palpitations, dizziness or syncope.  Pt denies polydipsia, polyuria, or low sugar symptoms such as weakness or confusion improved with po intake.  Pt denies new neurological symptoms such as new headache, or facial or extremity weakness or numbness.   Pt states overall good compliance with meds, has been trying to follow lower cholesterol diet, with wt overall stable,  but little exercise however.  Seeing Dr Patel/renal, now on increasedd lasix, edema controlled, denies orthostatic symptoms, weakness, falls.   Only c/o nose running with eating in the kitchen for some reason. Also has pain/tender with excercises at the right leg medial ligamentous complex doing at home.  Cannot afford the [facility] and the water excercises.  Has finished PT course. Here with wife, Overall good compliance with treatment, and good medicine tolerability.  Pt denies fever, night sweats, loss of appetite, or other constitutional symptoms, wt down though from 154 to 147 today since July visit.  Sees Dr Patel/renal approx every 4 mo, cr improved to 2.3 in July 2014 Past Medical History  Diagnosis Date  . Diabetes mellitus type II   . Hyperlipidemia   . Hypertension   . Osteoporosis   . Prostatic hypertrophy     benign  . OSA (obstructive sleep apnea)   . Rotator cuff syndrome     chronic  . Elevated PSA   . Hypothyroidism   . Nephrolithiasis   . Thyroid nodule   . GERD (gastroesophageal reflux disease)   . Colitis, Clostridium difficile     presumed 11/08  . Dementia   . Anemia     NOS  . Vitamin B 12 deficiency   . BRADYCARDIA 06/10/2009  . COLON CANCER, HX OF 09/20/2006  . HYPOTHYROIDISM 01/14/2007  . DIABETES MELLITUS, TYPE II 09/20/2006  . HYPERLIPIDEMIA 09/20/2006  . ANEMIA-NOS 06/23/2008  .  DEMENTIA 05/14/2007  . DEPRESSION 02/27/2007  . SLEEP APNEA, OBSTRUCTIVE 01/14/2007  . HYPERTENSION 09/20/2006  . GERD 01/14/2007  . OSTEOPOROSIS 10/08/2006  . PERIPHERAL EDEMA 09/03/2007  . RENAL INSUFFICIENCY 04/01/2010  . B12 deficiency 05/25/2010  . CLOSTRIDIUM DIFFICILE COLITIS 01/30/2007  . THYROID NODULE 01/14/2007  . CONSTIPATION, RECURRENT 01/06/2010  . BENIGN PROSTATIC HYPERTROPHY 10/08/2006  . ARTHRITIS 02/23/2007  . BACK PAIN, THORACIC REGION 06/04/2008  . COLONIC POLYPS, HX OF 10/08/2006  . NEPHROLITHIASIS, HX OF 01/14/2007  . Cancer of colon dx'd 1998  . Cancer of vocal cord dx'd 1998  . 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 DVT of lower limb, acute 01/12/2011   Past Surgical History  Procedure Laterality Date  . Cholecystectomy  1999  . Inguinal herniorrhapy  1984  . Appendectomy  1998  . Colon resection  1998    reports that he has never smoked. He has never used smokeless tobacco. He reports that he does not drink alcohol or use illicit drugs. family history includes Diabetes in his mother; Stroke in his father. Allergies  Allergen Reactions  . Biaxin [Clarithromycin] Other (See Comments)    Unknown   . Other Other (See Comments)    Nephrologist has told patient not to eat many different foods (tomatoes, Green Foods, etc).   . Sulfa Antibiotics Other (See Comments)    Unknown    Current Outpatient Prescriptions on File  Prior to Visit  Medication Sig Dispense Refill  . acetaminophen (TYLENOL) 500 MG tablet Take 1 tablet (500 mg total) by mouth every 6 (six) hours as needed for pain.  30 tablet  0  . buPROPion (WELLBUTRIN XL) 150 MG 24 hr tablet TAKE 1 TABLET BY MOUTH EVERY DAY  30 tablet  11  . Cholecalciferol (VITAMIN D-3 PO) Take by mouth daily.      . citalopram (CELEXA) 20 MG tablet TAKE 1 TABLET BY MOUTH ONCE A DAY  30 tablet  11  . Cyanocobalamin (VITAMIN B-12 IJ) Inject as directed as directed.      . docusate sodium (COLACE) 100 MG capsule Take 100 mg by  mouth at bedtime.        . fenofibrate 160 MG tablet Take 160 mg by mouth daily.      . ferrous sulfate 325 (65 FE) MG tablet Take 325 mg by mouth daily.      . furosemide (LASIX) 20 MG tablet 1 TAB BY MOUTH TWICE PER DAY, WITH THE SECOND DOSEONLY FOR PERSISTENTLEFT LEG SWELLING  60 tablet  11  . hydrALAZINE (APRESOLINE) 25 MG tablet Take 1 tablet (25 mg total) by mouth 3 (three) times daily.  90 tablet  11  . lansoprazole (PREVACID) 30 MG capsule Take 30 mg by mouth daily.      Marland Kitchen levothyroxine (SYNTHROID, LEVOTHROID) 100 MCG tablet Take 100 mcg by mouth daily before breakfast.      . lipase/protease/amylase (CREON-12/PANCREASE) 12000 UNITS CPEP Take 1-2 capsules by mouth 3 (three) times daily with meals.      Marland Kitchen OLANZapine (ZYPREXA) 5 MG tablet Take 5 mg by mouth daily.      . sitaGLIPtin (JANUVIA) 50 MG tablet Take 50 mg by mouth daily.      . tamsulosin (FLOMAX) 0.4 MG CAPS Take 0.4 mg by mouth at bedtime.       No current facility-administered medications on file prior to visit.   Review of Systems  Constitutional: Negative for unexpected weight change, or unusual diaphoresis  HENT: Negative for tinnitus.   Eyes: Negative for photophobia and visual disturbance.  Respiratory: Negative for choking and stridor.   Gastrointestinal: Negative for vomiting and blood in stool.  Genitourinary: Negative for hematuria and decreased urine volume.  Musculoskeletal: Negative for acute joint swelling Skin: Negative for color change and wound.  Neurological: Negative for tremors and numbness other than noted  Psychiatric/Behavioral: Negative for decreased concentration or  hyperactivity.       Objective:   Physical Exam BP 110/62  Pulse 82  Temp(Src) 99 F (37.2 C) (Oral)  Ht 5\' 9"  (1.753 m)  Wt 147 lb (66.679 kg)  BMI 21.7 kg/m2  SpO2 95% VS noted, thinner but NAD Constitutional: Pt appears well-developed and well-nourished.  HENT: Head: NCAT.  Right Ear: External ear normal.  Left Ear:  External ear normal.  Eyes: Conjunctivae and EOM are normal. Pupils are equal, round, and reactive to light.  Neck: Normal range of motion. Neck supple.  Cardiovascular: Normal rate and regular rhythm.   Pulmonary/Chest: Effort normal and breath sounds normal.  Abd:  Soft, NT, non-distended, + BS Neurological: Pt is alert. Has baseline confusion , and gait shuffling with leaning forward when not walking with walker Skin: Skin is warm. No erythema.  Psychiatric: Pt behavior is normal.     Assessment & Plan:

## 2012-12-17 NOTE — Assessment & Plan Note (Signed)
With f/u planned with Dr Allena Katz

## 2012-12-17 NOTE — Assessment & Plan Note (Signed)
?   Element of parkinsons - for refferall

## 2012-12-26 ENCOUNTER — Other Ambulatory Visit: Payer: Self-pay | Admitting: Dermatology

## 2012-12-26 DIAGNOSIS — L570 Actinic keratosis: Secondary | ICD-10-CM | POA: Diagnosis not present

## 2012-12-26 DIAGNOSIS — D485 Neoplasm of uncertain behavior of skin: Secondary | ICD-10-CM | POA: Diagnosis not present

## 2013-01-01 ENCOUNTER — Encounter (HOSPITAL_COMMUNITY)
Admission: RE | Admit: 2013-01-01 | Discharge: 2013-01-01 | Disposition: A | Discharge status: Home / Self Care | Payer: Medicare Other | Source: Ambulatory Visit | Attending: Nephrology | Admitting: Nephrology

## 2013-01-01 MED ORDER — EPOETIN ALFA 10000 UNIT/ML IJ SOLN
INTRAMUSCULAR | Status: AC
  Administered 2013-01-01: 10000 [IU] via SUBCUTANEOUS
  Filled 2013-01-01: qty 1

## 2013-01-01 MED ORDER — EPOETIN ALFA 10000 UNIT/ML IJ SOLN
10000.0000 [IU] | INTRAMUSCULAR | Status: DC
  Administered 2013-01-01: 10000 [IU] via SUBCUTANEOUS

## 2013-01-02 ENCOUNTER — Ambulatory Visit (INDEPENDENT_AMBULATORY_CARE_PROVIDER_SITE_OTHER): Payer: Medicare Other | Admitting: Neurology

## 2013-01-02 ENCOUNTER — Encounter: Payer: Self-pay | Admitting: Neurology

## 2013-01-02 VITALS — BP 158/60 | HR 64 | Temp 97.9°F | Resp 18 | Ht 68.0 in | Wt 154.7 lb

## 2013-01-02 DIAGNOSIS — G219 Secondary parkinsonism, unspecified: Secondary | ICD-10-CM | POA: Diagnosis not present

## 2013-01-02 DIAGNOSIS — G212 Secondary parkinsonism due to other external agents: Secondary | ICD-10-CM

## 2013-01-02 DIAGNOSIS — Z9181 History of falling: Secondary | ICD-10-CM

## 2013-01-02 DIAGNOSIS — G20 Parkinson's disease: Secondary | ICD-10-CM | POA: Diagnosis not present

## 2013-01-02 DIAGNOSIS — E1149 Type 2 diabetes mellitus with other diabetic neurological complication: Secondary | ICD-10-CM

## 2013-01-02 DIAGNOSIS — E1142 Type 2 diabetes mellitus with diabetic polyneuropathy: Secondary | ICD-10-CM

## 2013-01-02 DIAGNOSIS — R296 Repeated falls: Secondary | ICD-10-CM

## 2013-01-02 NOTE — Progress Notes (Signed)
Nathan Blackwell was seen today in the movement disorders clinic for neurologic consultation at the request of Oliver Barre, MD.  The consultation is for the evaluation of shuffling gait and to r/o PD.  This patient is accompanied in the office by his spouse who supplements the history.  The pt and wife report gait changes for 8 months x 1 year.  He has had several falls, but hasn't had a fall in months.  He recently attended therapy at the neurorehab center and that helped.  His coumadin was d/c after his last fall 2 months ago.    Specific Symptoms:  Tremor: no Voice: trouble with singing ever since vocal cord surgery in 1998; more hoarse Sleep: sleeps well with CPAP  Vivid Dreams:  yes  Acting out dreams:  no Wet Pillows: no Postural symptoms:  yes  Falls?  yes Bradykinesia symptoms: shuffling gait, slow movements, slurred or difficult speech, slow and small handwriting, difficulty getting out of a chair and difficulty regaining balance Loss of smell:  no Loss of taste:  yes Urinary Incontinence:  yes Difficulty Swallowing:  yes (primarily with pills) Handwriting, micrographia: yes (trouble writing last name) Trouble with ADL's:  no  Trouble buttoning clothing: no Depression:  Yes, on zyprexa for years which helps Memory changes:  yes (short term per wife, long term per pt; quit driving years ago) Hallucinations:  no  visual distortions: no N/V:  yes (nighttime, related to GERD) Lightheaded:  no  Syncope: no Diplopia:  no Dyskinesia:  no    PREVIOUS MEDICATIONS: none to date  ALLERGIES:   Allergies  Allergen Reactions  . Biaxin [Clarithromycin] Other (See Comments)    Unknown   . Other Other (See Comments)    Nephrologist has told patient not to eat many different foods (tomatoes, Green Foods, etc).   . Sulfa Antibiotics Other (See Comments)    Unknown     CURRENT MEDICATIONS:  Current Outpatient Prescriptions on File Prior to Visit  Medication Sig Dispense Refill  .  acetaminophen (TYLENOL) 500 MG tablet Take 1 tablet (500 mg total) by mouth every 6 (six) hours as needed for pain.  30 tablet  0  . buPROPion (WELLBUTRIN XL) 150 MG 24 hr tablet TAKE 1 TABLET BY MOUTH EVERY DAY  30 tablet  11  . Cholecalciferol (VITAMIN D-3 PO) Take by mouth daily.      . citalopram (CELEXA) 20 MG tablet TAKE 1 TABLET BY MOUTH ONCE A DAY  30 tablet  11  . docusate sodium (COLACE) 100 MG capsule Take 100 mg by mouth at bedtime.        . fenofibrate 160 MG tablet Take 160 mg by mouth daily.      . ferrous sulfate 325 (65 FE) MG tablet Take 325 mg by mouth daily.      . furosemide (LASIX) 20 MG tablet 1 TAB BY MOUTH TWICE PER DAY, WITH THE SECOND DOSEONLY FOR PERSISTENTLEFT LEG SWELLING  60 tablet  11  . hydrALAZINE (APRESOLINE) 25 MG tablet Take 1 tablet (25 mg total) by mouth 3 (three) times daily.  90 tablet  11  . lansoprazole (PREVACID) 30 MG capsule Take 30 mg by mouth daily.      Marland Kitchen levothyroxine (SYNTHROID, LEVOTHROID) 100 MCG tablet Take 100 mcg by mouth daily before breakfast.      . lipase/protease/amylase (CREON-12/PANCREASE) 12000 UNITS CPEP Take 1-2 capsules by mouth 3 (three) times daily with meals.      Marland Kitchen OLANZapine (ZYPREXA)  5 MG tablet Take 5 mg by mouth daily.      . sitaGLIPtin (JANUVIA) 50 MG tablet Take 50 mg by mouth daily.      . tamsulosin (FLOMAX) 0.4 MG CAPS Take 0.4 mg by mouth at bedtime.      . Cyanocobalamin (VITAMIN B-12 IJ) Inject as directed as directed.       No current facility-administered medications on file prior to visit.    PAST MEDICAL HISTORY:   Past Medical History  Diagnosis Date  . Diabetes mellitus type II   . Hyperlipidemia   . Hypertension   . Osteoporosis   . Prostatic hypertrophy     benign  . OSA (obstructive sleep apnea)   . Rotator cuff syndrome     chronic  . Elevated PSA   . Hypothyroidism   . Nephrolithiasis   . Thyroid nodule   . GERD (gastroesophageal reflux disease)   . Colitis, Clostridium difficile      presumed 11/08  . Dementia   . Anemia     NOS  . Vitamin B 12 deficiency   . BRADYCARDIA 06/10/2009  . COLON CANCER, HX OF 09/20/2006  . HYPOTHYROIDISM 01/14/2007  . DIABETES MELLITUS, TYPE II 09/20/2006  . HYPERLIPIDEMIA 09/20/2006  . ANEMIA-NOS 06/23/2008  . DEMENTIA 05/14/2007  . DEPRESSION 02/27/2007  . SLEEP APNEA, OBSTRUCTIVE 01/14/2007  . HYPERTENSION 09/20/2006  . GERD 01/14/2007  . OSTEOPOROSIS 10/08/2006  . PERIPHERAL EDEMA 09/03/2007  . RENAL INSUFFICIENCY 04/01/2010  . B12 deficiency 05/25/2010  . CLOSTRIDIUM DIFFICILE COLITIS 01/30/2007  . THYROID NODULE 01/14/2007  . CONSTIPATION, RECURRENT 01/06/2010  . BENIGN PROSTATIC HYPERTROPHY 10/08/2006  . ARTHRITIS 02/23/2007  . BACK PAIN, THORACIC REGION 06/04/2008  . COLONIC POLYPS, HX OF 10/08/2006  . NEPHROLITHIASIS, HX OF 01/14/2007  . Cancer of colon dx'd 1998  . Cancer of vocal cord dx'd 1998  . 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 DVT of lower limb, acute 01/12/2011    PAST SURGICAL HISTORY:   Past Surgical History  Procedure Laterality Date  . Cholecystectomy  1999  . Inguinal herniorrhapy  1984  . Appendectomy  1998  . Colon resection  1998    SOCIAL HISTORY:   History   Social History  . Marital Status: Married    Spouse Name: N/A    Number of Children: 2  . Years of Education: N/A   Occupational History  . retired     Dealer  .      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   Social History Main Topics  . Smoking status: Never Smoker   . Smokeless tobacco: Never Used  . Alcohol Use: No  . Drug Use: No  . Sexual Activity: Not on file   Other Topics Concern  . Not on file   Social History Narrative   Family history of Alcoholism/Addiction    FAMILY HISTORY:   Family Status  Relation Status Death Age  . Father Deceased 67    Stroke  . Mother Deceased 60    Diabetes  . Brother Alive     3  . Sister Alive   . Sister Deceased    . Son Alive   . Daughter Alive     Kidney Stones    ROS:  A complete 10 system review of systems was obtained and was unremarkable apart from what is mentioned above.  PHYSICAL EXAMINATION:    VITALS:  Filed Vitals:   01/02/13 0849  BP: 158/60  Pulse: 64  Temp: 97.9 F (36.6 C)  Resp: 18  Height: 5\' 8"  (1.727 m)  Weight: 154 lb 11.2 oz (70.171 kg)    GEN:  The patient appears stated age and is in NAD. HEENT:  Normocephalic, atraumatic.  The mucous membranes are moist. The superficial temporal arteries are without ropiness or tenderness. CV:  RRR Lungs:  CTAB Neck/HEME:  There are no carotid bruits bilaterally.  Neurological examination:  Orientation: The patient is alert and oriented x3. Fund of knowledge is appropriate.  Recent and remote memory are intact.  Attention and concentration are normal.    Able to name objects and repeat phrases. Cranial nerves: There is good facial symmetry. Pupils are equal round and reactive to light bilaterally. Fundoscopic exam reveals clear margins bilaterally. Extraocular muscles are intact. The visual fields are full to confrontational testing. The speech is fluent and clear. Soft palate rises symmetrically and there is no tongue deviation. Hearing is intact to conversational tone. Sensation: Sensation is intact to light and pinprick throughout (facial, trunk, extremities). Vibration is markedly decreased in the lower extremities, being absent at the bilateral big toe and ankle and a minimal sensation at the knee. There is extinction on the right with double simultaneous stimulation. There is no sensory dermatomal level identified. Motor: Strength is 5/5 in the bilateral upper and lower extremities.   Shoulder shrug is equal and symmetric.  There is a left pronator drift. Deep tendon reflexes: Deep tendon reflexes are 1/4 at the bilateral biceps, triceps, brachioradialis, patella and absent at the bilateral achilles. Plantar responses are  downgoing bilaterally.  Movement examination: Tone: There is slight increased tone in the left upper extremity.  Tone in the lower extremities and right upper extremity is normal.  tone in the bilateral upper extremities.   Abnormal movements: A resting tremor of the left upper extremity can be felt, but cannot particularly be seen. Coordination:  There is no significant decremation with RAM's Gait and Station: The patient has very minimal difficulty arising out of a deep-seated chair without the use of the hands. The patient's stride length is decreased with only mild shuffling.  He has mildly decreased arm swing on the left.       ASSESSMENT/PLAN:  1.  gait instability.  -I. think that this is multifactorial and much of this is due to diabetic peripheral neuropathy.  We talked about safety.  We talked about the diagnosis and pathophysiology.  Greater than 50% of the visit was spent in counseling.  -I do think that the patient has a component of parkinsonism, which I suspect is secondary to the Zyprexa.  The patient and I discussed this today.  Perhaps a better choice of atypical antipsychotic agents would be Seroquel, as this has less D2 receptor antagonism.  I am going to leave this to the expertise of Dr. Jonny Ruiz and the pt is to f/u with him.  -I am going to do an MRI of the brain, just because the patient did have some extinction on the right and had a left pronator drift.  I absolutely expect to see small vessel disease and cerebral atrophy given his age and coexisting medical problems, but I want to make sure that I am not missing anything else.  -I. will plan on seeing the patient back in 6 months, sooner should new neurologic issues arise.

## 2013-01-02 NOTE — Patient Instructions (Signed)
You have been scheduled for an MRI Brain at [facility] on 01/08/13. Your appointment time is 7 pm. Please arrive 15 minutes prior to your appointment time for registration purposes. There is no prep for this test. However, if you have any metal in your body, have a pacemaker or defibrillator, please be sure to let your ordering physician know. This test typically takes 45 minutes to 1 hour to complete. Follow up with Dr Tat in 6 months

## 2013-01-08 ENCOUNTER — Ambulatory Visit (HOSPITAL_COMMUNITY)
Admission: RE | Admit: 2013-01-08 | Discharge: 2013-01-08 | Disposition: A | Discharge status: Home / Self Care | Payer: Medicare Other | Source: Ambulatory Visit | Attending: Neurology | Admitting: Neurology

## 2013-01-08 DIAGNOSIS — G20 Parkinson's disease: Secondary | ICD-10-CM

## 2013-01-08 DIAGNOSIS — S0990XA Unspecified injury of head, initial encounter: Secondary | ICD-10-CM | POA: Diagnosis not present

## 2013-01-08 DIAGNOSIS — G20A1 Parkinson's disease without dyskinesia, without mention of fluctuations: Secondary | ICD-10-CM | POA: Insufficient documentation

## 2013-01-08 DIAGNOSIS — G319 Degenerative disease of nervous system, unspecified: Secondary | ICD-10-CM | POA: Insufficient documentation

## 2013-01-08 DIAGNOSIS — R296 Repeated falls: Secondary | ICD-10-CM

## 2013-01-08 DIAGNOSIS — Z9181 History of falling: Secondary | ICD-10-CM | POA: Insufficient documentation

## 2013-01-08 DIAGNOSIS — I6789 Other cerebrovascular disease: Secondary | ICD-10-CM | POA: Insufficient documentation

## 2013-01-10 ENCOUNTER — Telehealth: Payer: Self-pay | Admitting: Neurology

## 2013-01-10 NOTE — Telephone Encounter (Signed)
What are his MRI results?

## 2013-01-10 NOTE — Telephone Encounter (Signed)
Shows that brain has shrunk a little with age and some mild hardening of arteries in brain from HTN/hyperlipidemia but nothing unexpected and looks quite good for man of his age.

## 2013-01-10 NOTE — Telephone Encounter (Signed)
Calling to see if MRI results are available?. Please call. Call home or cell # [phone] / Sherri S.

## 2013-01-10 NOTE — Telephone Encounter (Signed)
Called pt's wife and relayed your message. She was very thankful.

## 2013-01-22 ENCOUNTER — Encounter (HOSPITAL_COMMUNITY)
Admission: RE | Admit: 2013-01-22 | Discharge: 2013-01-22 | Disposition: A | Discharge status: Home / Self Care | Payer: Medicare Other | Source: Ambulatory Visit | Attending: Nephrology | Admitting: Nephrology

## 2013-01-22 DIAGNOSIS — N184 Chronic kidney disease, stage 4 (severe): Secondary | ICD-10-CM | POA: Insufficient documentation

## 2013-01-22 DIAGNOSIS — I129 Hypertensive chronic kidney disease with stage 1 through stage 4 chronic kidney disease, or unspecified chronic kidney disease: Secondary | ICD-10-CM | POA: Insufficient documentation

## 2013-01-22 DIAGNOSIS — D649 Anemia, unspecified: Secondary | ICD-10-CM | POA: Diagnosis not present

## 2013-01-22 LAB — FERRITIN: Ferritin: 164 ng/mL (ref 22–322)

## 2013-01-22 LAB — IRON AND TIBC
Iron: 147 ug/dL — ABNORMAL HIGH (ref 42–135)
TIBC: 360 ug/dL (ref 215–435)
UIBC: 213 ug/dL (ref 125–400)

## 2013-01-22 MED ORDER — EPOETIN ALFA 10000 UNIT/ML IJ SOLN
INTRAMUSCULAR | Status: AC
  Filled 2013-01-22: qty 1

## 2013-01-22 MED ORDER — EPOETIN ALFA 10000 UNIT/ML IJ SOLN
10000.0000 [IU] | INTRAMUSCULAR | Status: DC
  Administered 2013-01-22: 13:00:00 10000 [IU] via SUBCUTANEOUS

## 2013-01-23 ENCOUNTER — Other Ambulatory Visit: Payer: Self-pay | Admitting: Dermatology

## 2013-02-11 ENCOUNTER — Other Ambulatory Visit (HOSPITAL_COMMUNITY): Payer: Self-pay

## 2013-02-12 ENCOUNTER — Encounter (HOSPITAL_COMMUNITY)
Admission: RE | Admit: 2013-02-12 | Discharge: 2013-02-12 | Disposition: A | Discharge status: Home / Self Care | Payer: Medicare Other | Source: Ambulatory Visit | Attending: Nephrology | Admitting: Nephrology

## 2013-02-12 DIAGNOSIS — N184 Chronic kidney disease, stage 4 (severe): Secondary | ICD-10-CM | POA: Insufficient documentation

## 2013-02-12 DIAGNOSIS — D649 Anemia, unspecified: Secondary | ICD-10-CM | POA: Diagnosis not present

## 2013-02-12 DIAGNOSIS — I129 Hypertensive chronic kidney disease with stage 1 through stage 4 chronic kidney disease, or unspecified chronic kidney disease: Secondary | ICD-10-CM | POA: Diagnosis not present

## 2013-02-12 LAB — RENAL FUNCTION PANEL
Albumin: 3.7 g/dL (ref 3.5–5.2)
CO2: 29 mEq/L (ref 19–32)
Chloride: 101 mEq/L (ref 96–112)
Creatinine, Ser: 2.36 mg/dL — ABNORMAL HIGH (ref 0.50–1.35)
GFR calc Af Amer: 26 mL/min — ABNORMAL LOW (ref >90)
Phosphorus: 3.1 mg/dL (ref 2.3–4.6)
Potassium: 3.7 mEq/L (ref 3.5–5.1)
Sodium: 142 mEq/L (ref 135–145)

## 2013-02-12 LAB — MAGNESIUM: Magnesium: 2.2 mg/dL (ref 1.5–2.5)

## 2013-02-12 MED ORDER — EPOETIN ALFA 10000 UNIT/ML IJ SOLN: 10000.0000 [IU] | INTRAMUSCULAR | Status: DC

## 2013-02-13 LAB — VITAMIN D 25 HYDROXY (VIT D DEFICIENCY, FRACTURES): Vit D, 25-Hydroxy: 47 ng/mL (ref 30–89)

## 2013-02-15 ENCOUNTER — Other Ambulatory Visit: Payer: Self-pay | Admitting: Internal Medicine

## 2013-02-19 DIAGNOSIS — N2581 Secondary hyperparathyroidism of renal origin: Secondary | ICD-10-CM | POA: Diagnosis not present

## 2013-02-19 DIAGNOSIS — D631 Anemia in chronic kidney disease: Secondary | ICD-10-CM | POA: Diagnosis not present

## 2013-02-19 DIAGNOSIS — I129 Hypertensive chronic kidney disease with stage 1 through stage 4 chronic kidney disease, or unspecified chronic kidney disease: Secondary | ICD-10-CM | POA: Diagnosis not present

## 2013-02-19 DIAGNOSIS — N184 Chronic kidney disease, stage 4 (severe): Secondary | ICD-10-CM | POA: Diagnosis not present

## 2013-02-24 DIAGNOSIS — C4491 Basal cell carcinoma of skin, unspecified: Secondary | ICD-10-CM | POA: Diagnosis not present

## 2013-02-24 DIAGNOSIS — C4441 Basal cell carcinoma of skin of scalp and neck: Secondary | ICD-10-CM | POA: Diagnosis not present

## 2013-03-01 ENCOUNTER — Other Ambulatory Visit: Payer: Self-pay | Admitting: Internal Medicine

## 2013-03-03 ENCOUNTER — Encounter (HOSPITAL_COMMUNITY)
Admission: RE | Admit: 2013-03-03 | Discharge: 2013-03-03 | Disposition: A | Discharge status: Home / Self Care | Payer: Medicare Other | Source: Ambulatory Visit | Attending: Nephrology | Admitting: Nephrology

## 2013-03-03 DIAGNOSIS — I129 Hypertensive chronic kidney disease with stage 1 through stage 4 chronic kidney disease, or unspecified chronic kidney disease: Secondary | ICD-10-CM | POA: Diagnosis not present

## 2013-03-03 DIAGNOSIS — D649 Anemia, unspecified: Secondary | ICD-10-CM | POA: Diagnosis not present

## 2013-03-03 DIAGNOSIS — N184 Chronic kidney disease, stage 4 (severe): Secondary | ICD-10-CM | POA: Diagnosis not present

## 2013-03-03 LAB — FERRITIN: Ferritin: 163 ng/mL (ref 22–322)

## 2013-03-03 LAB — POCT HEMOGLOBIN-HEMACUE: Hemoglobin: 11.3 g/dL — ABNORMAL LOW (ref 13.0–17.0)

## 2013-03-03 LAB — IRON AND TIBC: UIBC: 253 ug/dL (ref 125–400)

## 2013-03-03 MED ORDER — EPOETIN ALFA 10000 UNIT/ML IJ SOLN
10000.0000 [IU] | INTRAMUSCULAR | Status: DC
  Administered 2013-03-03: 10000 [IU] via SUBCUTANEOUS

## 2013-03-03 MED ORDER — EPOETIN ALFA 10000 UNIT/ML IJ SOLN
INTRAMUSCULAR | Status: AC
  Administered 2013-03-03: 10000 [IU] via SUBCUTANEOUS
  Filled 2013-03-03: qty 1

## 2013-03-21 ENCOUNTER — Other Ambulatory Visit (HOSPITAL_COMMUNITY): Payer: Self-pay | Admitting: *Deleted

## 2013-03-24 ENCOUNTER — Encounter (HOSPITAL_COMMUNITY)
Admission: RE | Admit: 2013-03-24 | Discharge: 2013-03-24 | Disposition: A | Discharge status: Home / Self Care | Payer: Medicare Other | Source: Ambulatory Visit | Attending: Nephrology | Admitting: Nephrology

## 2013-03-24 DIAGNOSIS — D649 Anemia, unspecified: Secondary | ICD-10-CM | POA: Insufficient documentation

## 2013-03-24 DIAGNOSIS — I129 Hypertensive chronic kidney disease with stage 1 through stage 4 chronic kidney disease, or unspecified chronic kidney disease: Secondary | ICD-10-CM | POA: Insufficient documentation

## 2013-03-24 DIAGNOSIS — N184 Chronic kidney disease, stage 4 (severe): Secondary | ICD-10-CM | POA: Insufficient documentation

## 2013-03-24 LAB — POCT HEMOGLOBIN-HEMACUE: Hemoglobin: 11.6 g/dL — ABNORMAL LOW (ref 13.0–17.0)

## 2013-03-24 MED ORDER — EPOETIN ALFA 10000 UNIT/ML IJ SOLN: 10000.0000 [IU] | INTRAMUSCULAR | Status: DC

## 2013-04-07 ENCOUNTER — Encounter (HOSPITAL_COMMUNITY)
Admission: RE | Admit: 2013-04-07 | Discharge: 2013-04-07 | Disposition: A | Discharge status: Home / Self Care | Payer: Medicare Other | Source: Ambulatory Visit | Attending: Nephrology | Admitting: Nephrology

## 2013-04-07 DIAGNOSIS — I129 Hypertensive chronic kidney disease with stage 1 through stage 4 chronic kidney disease, or unspecified chronic kidney disease: Secondary | ICD-10-CM | POA: Diagnosis not present

## 2013-04-07 DIAGNOSIS — N184 Chronic kidney disease, stage 4 (severe): Secondary | ICD-10-CM | POA: Diagnosis not present

## 2013-04-07 DIAGNOSIS — D649 Anemia, unspecified: Secondary | ICD-10-CM | POA: Insufficient documentation

## 2013-04-07 LAB — IRON AND TIBC
Iron: 125 ug/dL (ref 42–135)
Saturation Ratios: 34 % (ref 20–55)
TIBC: 367 ug/dL (ref 215–435)
UIBC: 242 ug/dL (ref 125–400)

## 2013-04-07 LAB — POCT HEMOGLOBIN-HEMACUE: HEMOGLOBIN: 12.1 g/dL — AB (ref 13.0–17.0)

## 2013-04-07 LAB — FERRITIN: FERRITIN: 229 ng/mL (ref 22–322)

## 2013-04-07 MED ORDER — EPOETIN ALFA 10000 UNIT/ML IJ SOLN: 10000.0000 [IU] | INTRAMUSCULAR | Status: DC

## 2013-04-21 ENCOUNTER — Encounter (HOSPITAL_COMMUNITY)
Admission: RE | Admit: 2013-04-21 | Discharge: 2013-04-21 | Disposition: A | Discharge status: Home / Self Care | Payer: Medicare Other | Source: Ambulatory Visit | Attending: Nephrology | Admitting: Nephrology

## 2013-04-21 LAB — POCT HEMOGLOBIN-HEMACUE: HEMOGLOBIN: 11.1 g/dL — AB (ref 13.0–17.0)

## 2013-04-21 MED ORDER — EPOETIN ALFA 10000 UNIT/ML IJ SOLN
INTRAMUSCULAR | Status: AC
  Filled 2013-04-21: qty 1

## 2013-04-21 MED ORDER — EPOETIN ALFA 10000 UNIT/ML IJ SOLN
10000.0000 [IU] | INTRAMUSCULAR | Status: DC
  Administered 2013-04-21: 10000 [IU] via SUBCUTANEOUS

## 2013-04-21 MED ORDER — EPOETIN ALFA 10000 UNIT/ML IJ SOLN
INTRAMUSCULAR | Status: AC
  Administered 2013-04-21: 10000 [IU] via SUBCUTANEOUS
  Filled 2013-04-21: qty 1

## 2013-04-22 ENCOUNTER — Ambulatory Visit: Payer: Medicare Other | Admitting: Internal Medicine

## 2013-05-01 ENCOUNTER — Ambulatory Visit: Payer: Medicare Other | Admitting: Internal Medicine

## 2013-05-02 ENCOUNTER — Ambulatory Visit (INDEPENDENT_AMBULATORY_CARE_PROVIDER_SITE_OTHER): Payer: Medicare Other | Admitting: Internal Medicine

## 2013-05-02 ENCOUNTER — Encounter: Payer: Self-pay | Admitting: Internal Medicine

## 2013-05-02 VITALS — BP 142/78 | HR 74 | Temp 98.1°F | Wt 149.1 lb

## 2013-05-02 DIAGNOSIS — E119 Type 2 diabetes mellitus without complications: Secondary | ICD-10-CM

## 2013-05-02 DIAGNOSIS — I10 Essential (primary) hypertension: Secondary | ICD-10-CM | POA: Diagnosis not present

## 2013-05-02 DIAGNOSIS — K5900 Constipation, unspecified: Secondary | ICD-10-CM | POA: Diagnosis not present

## 2013-05-02 DIAGNOSIS — F068 Other specified mental disorders due to known physiological condition: Secondary | ICD-10-CM | POA: Diagnosis not present

## 2013-05-02 NOTE — Assessment & Plan Note (Signed)
stable overall by history and exam, recent data reviewed with pt, and pt to continue medical treatment as before,  to f/u any worsening symptoms or concerns BP Readings from Last 3 Encounters:  05/02/13 142/78  04/21/13 140/61  04/07/13 168/81

## 2013-05-02 NOTE — Assessment & Plan Note (Signed)
To re-start the miralax/stool softner prn,  to f/u any worsening symptoms or concerns

## 2013-05-02 NOTE — Assessment & Plan Note (Signed)
stable overall by history and exam, recent data reviewed with pt, and pt to continue medical treatment as before,  to f/u any worsening symptoms or concerns Lab Results  Component Value Date   WBC 9.7 09/04/2012   HGB 11.1* 04/21/2013   HCT 37.2* 02/12/2013   PLT 234 09/04/2012   GLUCOSE 152* 02/12/2013   CHOL 173 01/30/2012   TRIG 289.0* 01/30/2012   HDL 36.50* 01/30/2012   LDLDIRECT 107.8 01/30/2012   LDLCALC 92 09/21/2011   ALT 22 08/04/2012   AST 33 08/04/2012   NA 142 02/12/2013   K 3.7 02/12/2013   CL 101 02/12/2013   CREATININE 2.36* 02/12/2013   BUN 36* 02/12/2013   CO2 29 02/12/2013   TSH 0.46 07/09/2012   PSA 4.88* 01/14/2007   INR 0.97 09/04/2012   HGBA1C 6.2 01/30/2012   MICROALBUR 29.7* 05/18/2009

## 2013-05-02 NOTE — Assessment & Plan Note (Addendum)
stable overall by history and exam, recent data reviewed with pt, and pt to continue medical treatment as before,  to f/u any worsening symptoms or concerns Lab Results  Component Value Date   HGBA1C 6.2 01/30/2012   Lab Results  Component Value Date   WBC 9.7 09/04/2012   HGB 11.1* 04/21/2013   HCT 37.2* 02/12/2013   PLT 234 09/04/2012   GLUCOSE 152* 02/12/2013   CHOL 173 01/30/2012   TRIG 289.0* 01/30/2012   HDL 36.50* 01/30/2012   LDLDIRECT 107.8 01/30/2012   LDLCALC 92 09/21/2011   ALT 22 08/04/2012   AST 33 08/04/2012   NA 142 02/12/2013   K 3.7 02/12/2013   CL 101 02/12/2013   CREATININE 2.36* 02/12/2013   BUN 36* 02/12/2013   CO2 29 02/12/2013   TSH 0.46 07/09/2012   PSA 4.88* 01/14/2007   INR 0.97 09/04/2012   HGBA1C 6.2 01/30/2012   MICROALBUR 29.7* 05/18/2009

## 2013-05-02 NOTE — Progress Notes (Signed)
Subjective:    Patient ID: Nathan Blackwell, male    DOB: Jun 08, 1920, 80 y.o.   MRN: [id number]  HPI  Here to fu, c/o recent constipation, but has gotten away from using his miralax daily, and stool softners.  Pt denies chest pain, increased sob or doe, wheezing, orthopnea, PND, increased LE swelling, palpitations, dizziness or syncope.  Pt denies new neurological symptoms such as new headache, or facial or extremity weakness or numbness   Pt denies polydipsia, polyuria, or low sugar symptoms such as weakness or confusion improved with po intake.  Pt states overall good compliance with meds, has seen renal with new lasix 80am, 40mg  at 2pm.   Pt denies fever, wt loss, night sweats, loss of appetite, or other constitutional symptoms. Dementia overall stable, and not assoc with behavioral changes such as hallucinations, paranoia, or agitation. Past Medical History  Diagnosis Date  . Diabetes mellitus type II   . Hyperlipidemia   . Hypertension   . Osteoporosis   . Prostatic hypertrophy     benign  . OSA (obstructive sleep apnea)   . Rotator cuff syndrome     chronic  . Elevated PSA   . Hypothyroidism   . Nephrolithiasis   . Thyroid nodule   . GERD (gastroesophageal reflux disease)   . Colitis, Clostridium difficile     presumed 11/08  . Dementia   . Anemia     NOS  . Vitamin B 12 deficiency   . BRADYCARDIA 06/10/2009  . COLON CANCER, HX OF 09/20/2006  . HYPOTHYROIDISM 01/14/2007  . DIABETES MELLITUS, TYPE II 09/20/2006  . HYPERLIPIDEMIA 09/20/2006  . ANEMIA-NOS 06/23/2008  . DEMENTIA 05/14/2007  . DEPRESSION 02/27/2007  . SLEEP APNEA, OBSTRUCTIVE 01/14/2007  . HYPERTENSION 09/20/2006  . GERD 01/14/2007  . OSTEOPOROSIS 10/08/2006  . PERIPHERAL EDEMA 09/03/2007  . RENAL INSUFFICIENCY 04/01/2010  . B12 deficiency 05/25/2010  . CLOSTRIDIUM DIFFICILE COLITIS 01/30/2007  . THYROID NODULE 01/14/2007  . CONSTIPATION, RECURRENT 01/06/2010  . BENIGN PROSTATIC HYPERTROPHY 10/08/2006  . ARTHRITIS  02/23/2007  . BACK PAIN, THORACIC REGION 06/04/2008  . COLONIC POLYPS, HX OF 10/08/2006  . NEPHROLITHIASIS, HX OF 01/14/2007  . Cancer of colon dx'd 1998  . Cancer of vocal cord dx'd 1998  . 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 DVT of lower limb, acute 01/12/2011  . Sleep apnea     CPAP dependent   Past Surgical History  Procedure Laterality Date  . Cholecystectomy  1999  . Inguinal herniorrhapy  1984  . Appendectomy  1998  . Colon resection  1998  . Skin cancer extraction    . Cataract extraction, bilateral      reports that he has never smoked. He has never used smokeless tobacco. He reports that he does not drink alcohol or use illicit drugs. family history includes Diabetes in his mother; Stroke in his father. Allergies  Allergen Reactions  . Biaxin [Clarithromycin] Other (See Comments)    Unknown   . Other Other (See Comments)    Nephrologist has told patient not to eat many different foods (tomatoes, [employer], etc).   . Sulfa Antibiotics Other (See Comments)    Unknown    Current Outpatient Prescriptions on File Prior to Visit  Medication Sig Dispense Refill  . acetaminophen (TYLENOL) 500 MG tablet Take 1 tablet (500 mg total) by mouth every 6 (six) hours as needed for pain.  30 tablet  0  . buPROPion (WELLBUTRIN XL) 150 MG 24 hr tablet TAKE 1  TABLET BY MOUTH EVERY DAY  30 tablet  11  . Cholecalciferol (VITAMIN D-3 PO) Take by mouth daily.      . citalopram (CELEXA) 20 MG tablet TAKE 1 TABLET BY MOUTH ONCE A DAY  30 tablet  11  . Cyanocobalamin (VITAMIN B-12 IJ) Inject as directed as directed.      . docusate sodium (COLACE) 100 MG capsule Take 100 mg by mouth at bedtime.        . fenofibrate 160 MG tablet Take 160 mg by mouth daily.      . ferrous sulfate 325 (65 FE) MG tablet Take 325 mg by mouth daily.      . furosemide (LASIX) 20 MG tablet 1 TAB BY MOUTH TWICE PER DAY, WITH THE SECOND DOSEONLY FOR PERSISTENTLEFT LEG SWELLING  60 tablet  11  . hydrALAZINE  (APRESOLINE) 25 MG tablet Take 1 tablet (25 mg total) by mouth 3 (three) times daily.  90 tablet  11  . lansoprazole (PREVACID) 30 MG capsule Take 30 mg by mouth daily.      . lipase/protease/amylase (CREON-12/PANCREASE) 12000 UNITS CPEP Take 1-2 capsules by mouth 3 (three) times daily with meals.      Marland Kitchen OLANZapine (ZYPREXA) 5 MG tablet Take 5 mg by mouth daily.      . sitaGLIPtin (JANUVIA) 50 MG tablet Take 50 mg by mouth daily.      Marland Kitchen SYNTHROID 100 MCG tablet TAKE 1 TABLET BY MOUTH ONCE A DAY  30 tablet  11  . tamsulosin (FLOMAX) 0.4 MG CAPS capsule TAKE 1 CAPSULE BY MOUTH AT BEDTIME  30 capsule  11   No current facility-administered medications on file prior to visit.   Review of Systems  Constitutional: Negative for unexpected weight change, or unusual diaphoresis  HENT: Negative for tinnitus.   Eyes: Negative for photophobia and visual disturbance.  Respiratory: Negative for choking and stridor.   Gastrointestinal: Negative for vomiting and blood in stool.  Genitourinary: Negative for hematuria and decreased urine volume.  Musculoskeletal: Negative for acute joint swelling Skin: Negative for color change and wound.  Neurological: Negative for tremors and numbness other than noted  Psychiatric/Behavioral: Negative for decreased concentration or  hyperactivity.       Objective:   Physical Exam BP 142/78  Pulse 74  Temp(Src) 98.1 F (36.7 C) (Oral)  Wt 149 lb 2 oz (67.643 kg)  SpO2 99% VS noted,  Constitutional: Pt appears well-developed and well-nourished.  HENT: Head: NCAT.  Right Ear: External ear normal.  Left Ear: External ear normal.  Eyes: Conjunctivae and EOM are normal. Pupils are equal, round, and reactive to light.  Neck: Normal range of motion. Neck supple.  Cardiovascular: Normal rate and regular rhythm.   Pulmonary/Chest: Effort normal and breath sounds normal.  Abd:  Soft, NT, non-distended, + BS Neurological: Pt is alert. At baseline confused  Skin: Skin is  warm. No erythema.  No LE edema Psychiatric: Pt behavior is normal    Assessment & Plan:

## 2013-05-02 NOTE — Patient Instructions (Signed)
There is no change in treatment today  Please continue all other medications as before, and refills have been done if requested. Please have the pharmacy call with any other refills you may need.  Please keep your appointments with your specialists as you have planned  Please return in 6 months, or sooner if needed

## 2013-05-02 NOTE — Progress Notes (Signed)
Pre visit review using our clinic review tool, if applicable. No additional management support is needed unless otherwise documented below in the visit note. 

## 2013-05-06 ENCOUNTER — Telehealth: Payer: Self-pay

## 2013-05-06 DIAGNOSIS — E119 Type 2 diabetes mellitus without complications: Secondary | ICD-10-CM | POA: Diagnosis not present

## 2013-05-06 DIAGNOSIS — L608 Other nail disorders: Secondary | ICD-10-CM | POA: Diagnosis not present

## 2013-05-06 NOTE — Telephone Encounter (Signed)
Relevant patient education assigned to patient using Emmi. ° °

## 2013-05-12 ENCOUNTER — Encounter (HOSPITAL_COMMUNITY)
Admission: RE | Admit: 2013-05-12 | Discharge: 2013-05-12 | Disposition: A | Discharge status: Home / Self Care | Payer: Medicare Other | Source: Ambulatory Visit | Attending: Nephrology | Admitting: Nephrology

## 2013-05-12 DIAGNOSIS — D649 Anemia, unspecified: Secondary | ICD-10-CM | POA: Diagnosis not present

## 2013-05-12 DIAGNOSIS — N184 Chronic kidney disease, stage 4 (severe): Secondary | ICD-10-CM | POA: Insufficient documentation

## 2013-05-12 DIAGNOSIS — I129 Hypertensive chronic kidney disease with stage 1 through stage 4 chronic kidney disease, or unspecified chronic kidney disease: Secondary | ICD-10-CM | POA: Diagnosis not present

## 2013-05-12 LAB — IRON AND TIBC
IRON: 138 ug/dL — AB (ref 42–135)
Saturation Ratios: 40 % (ref 20–55)
TIBC: 345 ug/dL (ref 215–435)
UIBC: 207 ug/dL (ref 125–400)

## 2013-05-12 LAB — FERRITIN: Ferritin: 185 ng/mL (ref 22–322)

## 2013-05-12 LAB — POCT HEMOGLOBIN-HEMACUE: HEMOGLOBIN: 11.4 g/dL — AB (ref 13.0–17.0)

## 2013-05-12 MED ORDER — EPOETIN ALFA 10000 UNIT/ML IJ SOLN
INTRAMUSCULAR | Status: AC
  Administered 2013-05-12: 10000 [IU] via SUBCUTANEOUS
  Filled 2013-05-12: qty 1

## 2013-05-12 MED ORDER — EPOETIN ALFA 10000 UNIT/ML IJ SOLN: 10000.0000 [IU] | INTRAMUSCULAR | Status: DC

## 2013-05-22 DIAGNOSIS — H01029 Squamous blepharitis unspecified eye, unspecified eyelid: Secondary | ICD-10-CM | POA: Diagnosis not present

## 2013-05-22 DIAGNOSIS — H02839 Dermatochalasis of unspecified eye, unspecified eyelid: Secondary | ICD-10-CM | POA: Diagnosis not present

## 2013-05-22 DIAGNOSIS — H04129 Dry eye syndrome of unspecified lacrimal gland: Secondary | ICD-10-CM | POA: Diagnosis not present

## 2013-05-24 ENCOUNTER — Emergency Department (INDEPENDENT_AMBULATORY_CARE_PROVIDER_SITE_OTHER): Payer: Medicare Other

## 2013-05-24 ENCOUNTER — Encounter (HOSPITAL_COMMUNITY): Payer: Self-pay | Admitting: Emergency Medicine

## 2013-05-24 ENCOUNTER — Emergency Department (HOSPITAL_COMMUNITY): Payer: Medicare Other

## 2013-05-24 ENCOUNTER — Observation Stay (HOSPITAL_COMMUNITY)
Admission: EM | Admit: 2013-05-24 | Discharge: 2013-05-26 | Disposition: A | Discharge status: Home / Self Care | Payer: Medicare Other | Attending: Dermatology | Admitting: Dermatology

## 2013-05-24 ENCOUNTER — Emergency Department (INDEPENDENT_AMBULATORY_CARE_PROVIDER_SITE_OTHER)
Admission: EM | Admit: 2013-05-24 | Discharge: 2013-05-24 | Disposition: A | Discharge status: Other Acute Inpatient Hospital | Payer: Medicare Other | Source: Home / Self Care | Attending: Family Medicine | Admitting: Family Medicine

## 2013-05-24 DIAGNOSIS — Z860100 Personal history of colon polyps, unspecified: Secondary | ICD-10-CM | POA: Insufficient documentation

## 2013-05-24 DIAGNOSIS — Z85038 Personal history of other malignant neoplasm of large intestine: Secondary | ICD-10-CM | POA: Insufficient documentation

## 2013-05-24 DIAGNOSIS — R112 Nausea with vomiting, unspecified: Secondary | ICD-10-CM | POA: Diagnosis not present

## 2013-05-24 DIAGNOSIS — E039 Hypothyroidism, unspecified: Secondary | ICD-10-CM | POA: Diagnosis present

## 2013-05-24 DIAGNOSIS — Z8521 Personal history of malignant neoplasm of larynx: Secondary | ICD-10-CM | POA: Insufficient documentation

## 2013-05-24 DIAGNOSIS — R109 Unspecified abdominal pain: Secondary | ICD-10-CM | POA: Diagnosis not present

## 2013-05-24 DIAGNOSIS — I10 Essential (primary) hypertension: Secondary | ICD-10-CM | POA: Diagnosis present

## 2013-05-24 DIAGNOSIS — E1122 Type 2 diabetes mellitus with diabetic chronic kidney disease: Secondary | ICD-10-CM | POA: Diagnosis present

## 2013-05-24 DIAGNOSIS — Z86718 Personal history of other venous thrombosis and embolism: Secondary | ICD-10-CM | POA: Diagnosis not present

## 2013-05-24 DIAGNOSIS — G4733 Obstructive sleep apnea (adult) (pediatric): Secondary | ICD-10-CM | POA: Insufficient documentation

## 2013-05-24 DIAGNOSIS — F329 Major depressive disorder, single episode, unspecified: Secondary | ICD-10-CM | POA: Insufficient documentation

## 2013-05-24 DIAGNOSIS — R9431 Abnormal electrocardiogram [ECG] [EKG]: Secondary | ICD-10-CM | POA: Insufficient documentation

## 2013-05-24 DIAGNOSIS — F3289 Other specified depressive episodes: Secondary | ICD-10-CM | POA: Insufficient documentation

## 2013-05-24 DIAGNOSIS — D649 Anemia, unspecified: Secondary | ICD-10-CM | POA: Insufficient documentation

## 2013-05-24 DIAGNOSIS — I498 Other specified cardiac arrhythmias: Secondary | ICD-10-CM | POA: Insufficient documentation

## 2013-05-24 DIAGNOSIS — K219 Gastro-esophageal reflux disease without esophagitis: Secondary | ICD-10-CM | POA: Diagnosis not present

## 2013-05-24 DIAGNOSIS — Z87440 Personal history of urinary (tract) infections: Secondary | ICD-10-CM | POA: Insufficient documentation

## 2013-05-24 DIAGNOSIS — E785 Hyperlipidemia, unspecified: Secondary | ICD-10-CM | POA: Insufficient documentation

## 2013-05-24 DIAGNOSIS — E119 Type 2 diabetes mellitus without complications: Secondary | ICD-10-CM | POA: Diagnosis not present

## 2013-05-24 DIAGNOSIS — M129 Arthropathy, unspecified: Secondary | ICD-10-CM | POA: Insufficient documentation

## 2013-05-24 DIAGNOSIS — E041 Nontoxic single thyroid nodule: Secondary | ICD-10-CM | POA: Insufficient documentation

## 2013-05-24 DIAGNOSIS — Z79899 Other long term (current) drug therapy: Secondary | ICD-10-CM | POA: Insufficient documentation

## 2013-05-24 DIAGNOSIS — Z8619 Personal history of other infectious and parasitic diseases: Secondary | ICD-10-CM | POA: Insufficient documentation

## 2013-05-24 DIAGNOSIS — M810 Age-related osteoporosis without current pathological fracture: Secondary | ICD-10-CM | POA: Insufficient documentation

## 2013-05-24 DIAGNOSIS — Z87442 Personal history of urinary calculi: Secondary | ICD-10-CM | POA: Insufficient documentation

## 2013-05-24 DIAGNOSIS — F0390 Unspecified dementia without behavioral disturbance: Secondary | ICD-10-CM | POA: Insufficient documentation

## 2013-05-24 DIAGNOSIS — E538 Deficiency of other specified B group vitamins: Secondary | ICD-10-CM | POA: Diagnosis not present

## 2013-05-24 DIAGNOSIS — I129 Hypertensive chronic kidney disease with stage 1 through stage 4 chronic kidney disease, or unspecified chronic kidney disease: Secondary | ICD-10-CM | POA: Diagnosis not present

## 2013-05-24 DIAGNOSIS — R1110 Vomiting, unspecified: Secondary | ICD-10-CM

## 2013-05-24 DIAGNOSIS — N183 Chronic kidney disease, stage 3 unspecified: Secondary | ICD-10-CM | POA: Diagnosis not present

## 2013-05-24 DIAGNOSIS — M67919 Unspecified disorder of synovium and tendon, unspecified shoulder: Secondary | ICD-10-CM | POA: Insufficient documentation

## 2013-05-24 DIAGNOSIS — N400 Enlarged prostate without lower urinary tract symptoms: Secondary | ICD-10-CM | POA: Diagnosis not present

## 2013-05-24 DIAGNOSIS — J449 Chronic obstructive pulmonary disease, unspecified: Secondary | ICD-10-CM | POA: Diagnosis not present

## 2013-05-24 DIAGNOSIS — R10A0 Flank pain, unspecified side: Secondary | ICD-10-CM

## 2013-05-24 DIAGNOSIS — M719 Bursopathy, unspecified: Secondary | ICD-10-CM | POA: Insufficient documentation

## 2013-05-24 LAB — URINALYSIS, ROUTINE W REFLEX MICROSCOPIC
Bilirubin Urine: NEGATIVE
Glucose, UA: NEGATIVE mg/dL
Hgb urine dipstick: NEGATIVE
Ketones, ur: NEGATIVE mg/dL
Nitrite: NEGATIVE
PROTEIN: 30 mg/dL — AB
Specific Gravity, Urine: 1.012 (ref 1.005–1.030)
Urobilinogen, UA: 0.2 mg/dL (ref 0.0–1.0)
pH: 7.5 (ref 5.0–8.0)

## 2013-05-24 LAB — COMPREHENSIVE METABOLIC PANEL
ALK PHOS: 47 U/L (ref 39–117)
ALT: 19 U/L (ref 0–53)
AST: 34 U/L (ref 0–37)
Albumin: 3.8 g/dL (ref 3.5–5.2)
BILIRUBIN TOTAL: 0.6 mg/dL (ref 0.3–1.2)
BUN: 36 mg/dL — ABNORMAL HIGH (ref 6–23)
CHLORIDE: 97 meq/L (ref 96–112)
CO2: 26 meq/L (ref 19–32)
Calcium: 9.7 mg/dL (ref 8.4–10.5)
Creatinine, Ser: 2.02 mg/dL — ABNORMAL HIGH (ref 0.50–1.35)
GFR calc Af Amer: 32 mL/min — ABNORMAL LOW (ref >90)
GFR, EST NON AFRICAN AMERICAN: 27 mL/min — AB (ref >90)
Glucose, Bld: 148 mg/dL — ABNORMAL HIGH (ref 70–99)
POTASSIUM: 3.7 meq/L (ref 3.7–5.3)
SODIUM: 140 meq/L (ref 137–147)
Total Protein: 7.2 g/dL (ref 6.0–8.3)

## 2013-05-24 LAB — CBC WITH DIFFERENTIAL/PLATELET
BASOS ABS: 0 10*3/uL (ref 0.0–0.1)
Basophils Relative: 0 % (ref 0–1)
Eosinophils Absolute: 0 10*3/uL (ref 0.0–0.7)
Eosinophils Relative: 0 % (ref 0–5)
HCT: 38.2 % — ABNORMAL LOW (ref 39.0–52.0)
Hemoglobin: 13.1 g/dL (ref 13.0–17.0)
Lymphocytes Relative: 10 % — ABNORMAL LOW (ref 12–46)
Lymphs Abs: 0.9 10*3/uL (ref 0.7–4.0)
MCH: 33.7 pg (ref 26.0–34.0)
MCHC: 34.3 g/dL (ref 30.0–36.0)
MCV: 98.2 fL (ref 78.0–100.0)
Monocytes Absolute: 0.3 10*3/uL (ref 0.1–1.0)
Monocytes Relative: 3 % (ref 3–12)
NEUTROS ABS: 8 10*3/uL — AB (ref 1.7–7.7)
NEUTROS PCT: 87 % — AB (ref 43–77)
PLATELETS: 290 10*3/uL (ref 150–400)
RBC: 3.89 MIL/uL — ABNORMAL LOW (ref 4.22–5.81)
RDW: 14.8 % (ref 11.5–15.5)
WBC: 9.2 10*3/uL (ref 4.0–10.5)

## 2013-05-24 LAB — POCT I-STAT, CHEM 8
BUN: 33 mg/dL — ABNORMAL HIGH (ref 6–23)
CHLORIDE: 101 meq/L (ref 96–112)
Calcium, Ion: 1.16 mmol/L (ref 1.13–1.30)
Creatinine, Ser: 2.4 mg/dL — ABNORMAL HIGH (ref 0.50–1.35)
Glucose, Bld: 161 mg/dL — ABNORMAL HIGH (ref 70–99)
HCT: 44 % (ref 39.0–52.0)
Hemoglobin: 15 g/dL (ref 13.0–17.0)
Potassium: 3.7 mEq/L (ref 3.7–5.3)
SODIUM: 143 meq/L (ref 137–147)
TCO2: 30 mmol/L (ref 0–100)

## 2013-05-24 LAB — URINE MICROSCOPIC-ADD ON

## 2013-05-24 LAB — LIPASE, BLOOD: Lipase: 50 U/L (ref 11–59)

## 2013-05-24 LAB — I-STAT CG4 LACTIC ACID, ED: Lactic Acid, Venous: 1.74 mmol/L (ref 0.5–2.2)

## 2013-05-24 MED ORDER — HYDRALAZINE HCL 25 MG PO TABS
12.5000 mg | ORAL_TABLET | Freq: Once | ORAL | Status: DC
  Filled 2013-05-24: qty 0.5

## 2013-05-24 MED ORDER — ONDANSETRON HCL 4 MG/2ML IJ SOLN
4.0000 mg | Freq: Once | INTRAMUSCULAR | Status: AC
  Administered 2013-05-24: 4 mg via INTRAVENOUS
  Filled 2013-05-24: qty 2

## 2013-05-24 MED ORDER — HYDROMORPHONE HCL PF 1 MG/ML IJ SOLN: 0.2000 mg | INTRAMUSCULAR | Status: DC | PRN

## 2013-05-24 MED ORDER — ONDANSETRON 4 MG PO TBDP
ORAL_TABLET | ORAL | Status: AC
Start: 2013-05-24 — End: 2013-05-24
  Filled 2013-05-24: qty 1

## 2013-05-24 MED ORDER — SODIUM CHLORIDE 0.9 % IV BOLUS (SEPSIS)
1000.0000 mL | Freq: Once | INTRAVENOUS | Status: AC
  Administered 2013-05-24: 1000 mL via INTRAVENOUS

## 2013-05-24 MED ORDER — ONDANSETRON 4 MG PO TBDP
4.0000 mg | ORAL_TABLET | Freq: Once | ORAL | Status: AC
  Administered 2013-05-24: 4 mg via ORAL

## 2013-05-24 MED ORDER — METOCLOPRAMIDE HCL 5 MG/ML IJ SOLN
10.0000 mg | Freq: Once | INTRAMUSCULAR | Status: AC
  Administered 2013-05-24: 10 mg via INTRAVENOUS
  Filled 2013-05-24: qty 2

## 2013-05-24 NOTE — ED Notes (Signed)
PT  REPORTS     VOMITING          THIS  AM         MULTIPLE  TIMES        UNABLE  TO  KEEP  LIQUIDS  DOWN   HE  REPORTS  PAIN  R  SIDE   WHICH  HE  REPORTS  STARTED   ON THE  WAY  OVER    SEEN RECENTLY  BY  EYE  DR  AND  STARTED  ON A  NEW  MED  NAME  UNKNOWN           PT  DENYS  ANY INJURY  AMBULATED  TO  ROOM  WITH A  SLOW  STEADY  GAIT  NO  ACTIVE  VOMITING

## 2013-05-24 NOTE — ED Notes (Addendum)
Pt states that he has been nauseated since this morning and was having issue with urinatry frequency and lower right back pain that have since resolved.  Pt given zofran at 1825 at urgent care that pt says seems to have helped some

## 2013-05-24 NOTE — ED Notes (Addendum)
Pippin MD at bedside. 

## 2013-05-24 NOTE — ED Provider Notes (Signed)
CSN: [id number]     Arrival date & time 05/24/13  1937 History   First MD Initiated Contact with Patient 05/24/13 2118     Chief Complaint  Patient presents with  . Nausea   HPI 80 year old male with history kidney stones remotely, diabetes, chronic kidney disease presents complaining of nausea and vomiting. This started several hours ago earlier today. He's had 5-10 episodes of nonbloody nonbilious emesis. He went to an urgent care where he was given Zofran with significant improvement in his symptoms. He performed an acute abdominal series that demonstrated a 4 mm left ureteral calculus. Due to his age and comorbidities he was transferred to the emergency department for further evaluation.  He reports that while he was on his way to the emergency department he had some sharp discomfort in his back, CVA region. He thinks it was on the right side but is not sure. It resolved. It has recurred a few times. It lasts for only seconds to a few minutes at a time. The pain has not been severe. He denies any fever, chills, chest pain, shortness of breath, diarrhea, constipation, dysuria, urinary urgency or frequency or other symptoms. He has been well recently with no recent hospital admissions.   Past Medical History  Diagnosis Date  . Diabetes mellitus type II   . Hyperlipidemia   . Hypertension   . Osteoporosis   . Prostatic hypertrophy     benign  . OSA (obstructive sleep apnea)   . Rotator cuff syndrome     chronic  . Elevated PSA   . Hypothyroidism   . Nephrolithiasis   . Thyroid nodule   . GERD (gastroesophageal reflux disease)   . Colitis, Clostridium difficile     presumed 11/08  . Dementia   . Anemia     NOS  . Vitamin B 12 deficiency   . BRADYCARDIA 06/10/2009  . COLON CANCER, HX OF 09/20/2006  . HYPOTHYROIDISM 01/14/2007  . DIABETES MELLITUS, TYPE II 09/20/2006  . HYPERLIPIDEMIA 09/20/2006  . ANEMIA-NOS 06/23/2008  . DEMENTIA 05/14/2007  . DEPRESSION 02/27/2007  . SLEEP APNEA,  OBSTRUCTIVE 01/14/2007  . HYPERTENSION 09/20/2006  . GERD 01/14/2007  . OSTEOPOROSIS 10/08/2006  . PERIPHERAL EDEMA 09/03/2007  . RENAL INSUFFICIENCY 04/01/2010  . B12 deficiency 05/25/2010  . CLOSTRIDIUM DIFFICILE COLITIS 01/30/2007  . THYROID NODULE 01/14/2007  . CONSTIPATION, RECURRENT 01/06/2010  . BENIGN PROSTATIC HYPERTROPHY 10/08/2006  . ARTHRITIS 02/23/2007  . BACK PAIN, THORACIC REGION 06/04/2008  . COLONIC POLYPS, HX OF 10/08/2006  . NEPHROLITHIASIS, HX OF 01/14/2007  . Cancer of colon dx'd 1998  . Cancer of vocal cord dx'd 1998  . 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 DVT of lower limb, acute 01/12/2011  . Sleep apnea     CPAP dependent   Past Surgical History  Procedure Laterality Date  . Cholecystectomy  1999  . Inguinal herniorrhapy  1984  . Appendectomy  1998  . Colon resection  1998  . Skin cancer extraction    . Cataract extraction, bilateral     Family History  Problem Relation Age of Onset  . Diabetes Mother   . Stroke Father    History  Substance Use Topics  . Smoking status: Never Smoker   . Smokeless tobacco: Never Used  . Alcohol Use: No    Review of Systems  Constitutional: Negative for fever and chills.  HENT: Negative for congestion and rhinorrhea.   Eyes: Negative for visual disturbance.  Respiratory: Negative for cough and shortness  of breath.   Cardiovascular: Negative for chest pain and leg swelling.  Gastrointestinal: Positive for nausea and vomiting. Negative for abdominal pain and diarrhea.  Genitourinary: Positive for flank pain. Negative for dysuria, urgency, frequency and difficulty urinating.  Musculoskeletal: Negative for back pain, neck pain and neck stiffness.  Skin: Negative for rash.  Neurological: Negative for syncope, weakness, numbness and headaches.  All other systems reviewed and are negative.      Allergies  Biaxin; Other; and Sulfa antibiotics  Home Medications   Current Outpatient Rx  Name  Route  Sig  Dispense   Refill  . acetaminophen (TYLENOL) 500 MG tablet   Oral   Take 1 tablet (500 mg total) by mouth every 6 (six) hours as needed for pain.   30 tablet   0   . buPROPion (WELLBUTRIN XL) 150 MG 24 hr tablet   Oral   Take 150 mg by mouth daily.         . cholecalciferol (VITAMIN D) 1000 UNITS tablet   Oral   Take 1,000 Units by mouth daily.         . citalopram (CELEXA) 20 MG tablet   Oral   Take 20 mg by mouth daily.         Marland Kitchen docusate sodium (COLACE) 100 MG capsule   Oral   Take 100 mg by mouth at bedtime.           . fenofibrate 160 MG tablet   Oral   Take 160 mg by mouth daily.         . furosemide (LASIX) 20 MG tablet   Oral   Take 20-40 mg by mouth 2 (two) times daily. 40mg  in the morning; 20mg  in the afternoon         . hydrALAZINE (APRESOLINE) 25 MG tablet   Oral   Take 12.5 mg by mouth 3 (three) times daily.         . lansoprazole (PREVACID) 30 MG capsule   Oral   Take 30 mg by mouth daily.         Marland Kitchen levothyroxine (SYNTHROID, LEVOTHROID) 100 MCG tablet   Oral   Take 100 mcg by mouth daily before breakfast.         . lipase/protease/amylase (CREON-12/PANCREASE) 12000 UNITS CPEP   Oral   Take 1 capsule by mouth 3 (three) times daily with meals.          Marland Kitchen OLANZapine (ZYPREXA) 5 MG tablet   Oral   Take 5 mg by mouth daily.         . sitaGLIPtin (JANUVIA) 50 MG tablet   Oral   Take 50 mg by mouth daily.         . tamsulosin (FLOMAX) 0.4 MG CAPS capsule   Oral   Take 0.4 mg by mouth at bedtime.          BP 182/73  Pulse 57  Temp(Src) 98.4 F (36.9 C) (Oral)  Resp 19  Ht 5' 8.5" (1.74 m)  Wt 148 lb 10.5 oz (67.43 kg)  BMI 22.27 kg/m2  SpO2 99% Physical Exam  Nursing note and vitals reviewed. Constitutional: He is oriented to person, place, and time. He appears well-developed and well-nourished. No distress.  HENT:  Head: Normocephalic.  Mouth/Throat: Oropharynx is clear and moist.  Eyes: Conjunctivae and EOM are normal.  Pupils are equal, round, and reactive to light. No scleral icterus.  Neck: Normal range of motion. Neck supple.  Cardiovascular: Normal  rate, regular rhythm, normal heart sounds and intact distal pulses.  Exam reveals no gallop and no friction rub.   No murmur heard. Pulmonary/Chest: Effort normal and breath sounds normal. No respiratory distress. He has no wheezes. He has no rales.  Abdominal: Soft. Normal appearance, normal aorta and bowel sounds are normal. He exhibits no distension, no ascites, no pulsatile midline mass and no mass. There is no tenderness. There is no rigidity, no rebound, no guarding, no CVA tenderness, no tenderness at McBurney's point and negative Murphy's sign. No hernia.  Musculoskeletal: He exhibits no edema.  Neurological: He is alert and oriented to person, place, and time. No cranial nerve deficit. He exhibits normal muscle tone. Coordination normal.  Skin: Skin is warm and dry. He is not diaphoretic.  Psychiatric: He has a normal mood and affect.    ED Course  Procedures (including critical care time) Labs Review Labs Reviewed  CBC WITH DIFFERENTIAL - Abnormal; Notable for the following:    RBC 3.89 (*)    HCT 38.2 (*)    Neutrophils Relative % 87 (*)    Neutro Abs 8.0 (*)    Lymphocytes Relative 10 (*)    All other components within normal limits  COMPREHENSIVE METABOLIC PANEL - Abnormal; Notable for the following:    Glucose, Bld 148 (*)    BUN 36 (*)    Creatinine, Ser 2.02 (*)    GFR calc non Af Amer 27 (*)    GFR calc Af Amer 32 (*)    All other components within normal limits  LIPASE, BLOOD  URINALYSIS, ROUTINE W REFLEX MICROSCOPIC  TROPONIN I  I-STAT CG4 LACTIC ACID, ED  I-STAT TROPOININ, ED   Imaging Review Ct Abdomen Pelvis Wo Contrast  05/24/2013   CLINICAL DATA:  Right-sided flank pain and nausea.  EXAM: CT ABDOMEN AND PELVIS WITHOUT CONTRAST  TECHNIQUE: Multidetector CT imaging of the abdomen and pelvis was performed following the  standard protocol without intravenous contrast.  COMPARISON:  DG ABD ACUTE W/CHEST dated 05/24/2013; CT ABD/PELV WO CM dated 09/23/2010  FINDINGS: Lung bases are clear.  No pericardial fluid.  Several low-density lesions in the liver cannot be fully characterized without IV contrast. Post cholecystectomy. The pancreas, spleen, adrenal glands are normal.  There is no nephrolithiasis or ureterolithiasis. Bilateral renal cortical thinning. There are bilateral exophytic simple fluid attenuation cystic lesions involving both the left right kidney which cannot be fully characterized but statistically represent benign cysts.  There is a moderate hiatal hernia. Stomach, small bowel wall normal. There is enteric colonic anastomosis on the right colon. . Colon and rectosigmoid are predominantly collapsed. No obstructing lesion identified. Abdominal aorta is normal caliber. No retroperitoneal periportal lymphadenopathy.  No free fluid the pelvis. No distal ureteral stones or bladder stones. The prostate gland is enlarged. No pelvic lymphadenopathy.  No aggressive osseous lesion.  IMPRESSION: 1. No nephrolithiasis, ureterolithiasis, or obstructive uropathy. 2. Bilateral simple fluid attenuation renal lesions likely represent benign cyst. 3. Right-sided bowel anastomosis without evidence of complicating features. 4. Prostate hypertrophy.   Electronically Signed   By: Suzy Bouchard M.D.   On: 05/24/2013 22:49   Dg Abd Acute W/chest  05/24/2013   CLINICAL DATA:  Right-side pain and vomiting for 1 day, history hypertension, diabetes  EXAM: ACUTE ABDOMEN SERIES (ABDOMEN 2 VIEW & CHEST 1 VIEW)  COMPARISON:  Chest radiograph 07/10/2012, abdominal radiographs 07/09/2012  FINDINGS: Normal heart size, mediastinal contours and pulmonary vascularity.  Atherosclerotic calcification aorta.  Emphysematous changes with  biapical scarring.  No acute infiltrate, pleural effusion or pneumothorax.  Minimal atelectasis at right costophrenic  angle.  Surgical clips right upper quadrant question cholecystectomy.  Anastomotic staples in right mid abdomen.  Nonobstructive bowel gas pattern.  No bowel dilatation bowel wall thickening, or free intraperitoneal air.  Bones unremarkable.  4 mm diameter left paraspinal calcification at the superior L4 level is not definitely seen on previous exam, cannot exclude a left ureteral calculus.  IMPRESSION: COPD changes with biapical scarring.  No acute infiltrates.  Normal bowel gas pattern.  Cannot exclude 4 mm left ureteral calculus; recommend correlation with urinalysis.   Electronically Signed   By: Lavonia Dana M.D.   On: 05/24/2013 18:42     Date: 05/25/2013  Rate: 59  Rhythm: sinus bradycardia  QRS Axis: left  Intervals: QT prolonged  ST/T Wave abnormalities: normal  Conduction Disutrbances:none  Narrative Interpretation:   Old EKG Reviewed: QT is now prolonged.    MDM   80 year old male presents complaining of nausea and vomiting. Greater than 10 episodes of nonbloody nonbilious emesis. No significant discomfort associated with the vomiting. No chest pain. No recent illness. No other symptoms.  Hypertensive to the 160s. Otherwise normal vitals. Nontoxic appearing. Mucous membranes dry. Abdomen soft and nontender. Normal cardiopulmonary exam.  There is some concern for possible kidney stone based on plain film opacity at urgent care. I performed a CT stone study, electing to spare contrast given his chronic kidney disease. This demonstrates no acute abnormality including kidney stone. No bowel obstruction.  Labs show baseline creatinine level at 2, no leukocytosis, normal hemoglobin, normal lipase, normal UA, negative troponin times one, normal lactate.  EKG demonstrates no acute ischemic changes, but demonstrates a prolonged QTC which I calculated 549. This is likely medication side effect.  Patient was given Zofran and Reglan during his emergency department course. He was reevaluated  multiple times and seemed to have worsening nausea throughout his ED course. He had minimal vomiting but frequent retching. His daughter states that he has had episodes similar to this several times in the past.  I feel he needs acute coronary syndrome rule out, monitoring, further investigation as to the cause of his nausea and vomiting given his age and comorbidities. Admitted to hospitalist service.   Final diagnoses:  Nausea and vomiting  Prolonged Q-T interval on ECG  CKD (chronic kidney disease), stage III      Wendall Papa, MD 05/25/13 0126

## 2013-05-24 NOTE — ED Notes (Signed)
2 failed attempts to start IV. IV team paged

## 2013-05-24 NOTE — ED Notes (Signed)
Patient transported to CT 

## 2013-05-24 NOTE — ED Provider Notes (Signed)
Nathan Blackwell is a 80 y.o. male who presents to Urgent Care today for nausea vomiting and flank pain. Patient has had right-sided flank pain and abdominal pain starting this morning associated with multiple episodes of vomiting. He was unable to keep down his blood pressure medication today. He was able to tolerate Phenergan which helped only for a few hours. He denies any dizziness lightheadedness or weakness. He denies any fevers or chills. He denies any diarrhea. He has a history of kidney stone as well as significant as chronic kidney disease and multiple other medical problems including colon cancer.   Past Medical History  Diagnosis Date  . Diabetes mellitus type II   . Hyperlipidemia   . Hypertension   . Osteoporosis   . Prostatic hypertrophy     benign  . OSA (obstructive sleep apnea)   . Rotator cuff syndrome     chronic  . Elevated PSA   . Hypothyroidism   . Nephrolithiasis   . Thyroid nodule   . GERD (gastroesophageal reflux disease)   . Colitis, Clostridium difficile     presumed 11/08  . Dementia   . Anemia     NOS  . Vitamin B 12 deficiency   . BRADYCARDIA 06/10/2009  . COLON CANCER, HX OF 09/20/2006  . HYPOTHYROIDISM 01/14/2007  . DIABETES MELLITUS, TYPE II 09/20/2006  . HYPERLIPIDEMIA 09/20/2006  . ANEMIA-NOS 06/23/2008  . DEMENTIA 05/14/2007  . DEPRESSION 02/27/2007  . SLEEP APNEA, OBSTRUCTIVE 01/14/2007  . HYPERTENSION 09/20/2006  . GERD 01/14/2007  . OSTEOPOROSIS 10/08/2006  . PERIPHERAL EDEMA 09/03/2007  . RENAL INSUFFICIENCY 04/01/2010  . B12 deficiency 05/25/2010  . CLOSTRIDIUM DIFFICILE COLITIS 01/30/2007  . THYROID NODULE 01/14/2007  . CONSTIPATION, RECURRENT 01/06/2010  . BENIGN PROSTATIC HYPERTROPHY 10/08/2006  . ARTHRITIS 02/23/2007  . BACK PAIN, THORACIC REGION 06/04/2008  . COLONIC POLYPS, HX OF 10/08/2006  . NEPHROLITHIASIS, HX OF 01/14/2007  . Cancer of colon dx'd 1998  . Cancer of vocal cord dx'd 1998  . 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 DVT of  lower limb, acute 01/12/2011  . Sleep apnea     CPAP dependent   History  Substance Use Topics  . Smoking status: Never Smoker   . Smokeless tobacco: Never Used  . Alcohol Use: No   ROS as above Medications: No current facility-administered medications for this encounter.   Current Outpatient Prescriptions  Medication Sig Dispense Refill  . acetaminophen (TYLENOL) 500 MG tablet Take 1 tablet (500 mg total) by mouth every 6 (six) hours as needed for pain.  30 tablet  0  . buPROPion (WELLBUTRIN XL) 150 MG 24 hr tablet TAKE 1 TABLET BY MOUTH EVERY DAY  30 tablet  11  . Cholecalciferol (VITAMIN D-3 PO) Take by mouth daily.      . citalopram (CELEXA) 20 MG tablet TAKE 1 TABLET BY MOUTH ONCE A DAY  30 tablet  11  . Cyanocobalamin (VITAMIN B-12 IJ) Inject as directed as directed.      . docusate sodium (COLACE) 100 MG capsule Take 100 mg by mouth at bedtime.        . fenofibrate 160 MG tablet Take 160 mg by mouth daily.      . ferrous sulfate 325 (65 FE) MG tablet Take 325 mg by mouth daily.      . furosemide (LASIX) 20 MG tablet 1 TAB BY MOUTH TWICE PER DAY, WITH THE SECOND DOSEONLY FOR PERSISTENTLEFT LEG SWELLING  60 tablet  11  . hydrALAZINE (  APRESOLINE) 25 MG tablet Take 1 tablet (25 mg total) by mouth 3 (three) times daily.  90 tablet  11  . lansoprazole (PREVACID) 30 MG capsule Take 30 mg by mouth daily.      . lipase/protease/amylase (CREON-12/PANCREASE) 12000 UNITS CPEP Take 1-2 capsules by mouth 3 (three) times daily with meals.      Marland Kitchen OLANZapine (ZYPREXA) 5 MG tablet Take 5 mg by mouth daily.      . sitaGLIPtin (JANUVIA) 50 MG tablet Take 50 mg by mouth daily.      Marland Kitchen SYNTHROID 100 MCG tablet TAKE 1 TABLET BY MOUTH ONCE A DAY  30 tablet  11  . tamsulosin (FLOMAX) 0.4 MG CAPS capsule TAKE 1 CAPSULE BY MOUTH AT BEDTIME  30 capsule  11    Exam:  BP 180/116  Pulse 64  Temp(Src) 98.3 F (36.8 C) (Oral)  Resp 16  SpO2 100% Gen: Well NAD HEENT: EOMI,  MMM Lungs: Normal work of  breathing. CTABL Heart: RRR no MRG Abd: NABS, Soft. NT, ND no CVA no tenderness to percussion. Abdomen is soft with no masses palpated Exts: Brisk capillary refill, warm and well perfused.   He was given Zofran which had no effect  Results for orders placed during the hospital encounter of 05/24/13 (from the past 24 hour(s))  POCT I-STAT, CHEM 8     Status: Abnormal   Collection Time    05/24/13  6:50 PM      Result Value Ref Range   Sodium 143  137 - 147 mEq/L   Potassium 3.7  3.7 - 5.3 mEq/L   Chloride 101  96 - 112 mEq/L   BUN 33 (*) 6 - 23 mg/dL   Creatinine, Ser 2.40 (*) 0.50 - 1.35 mg/dL   Glucose, Bld 161 (*) 70 - 99 mg/dL   Calcium, Ion 1.16  1.13 - 1.30 mmol/L   TCO2 30  0 - 100 mmol/L   Hemoglobin 15.0  13.0 - 17.0 g/dL   HCT 44.0  39.0 - 52.0 %   Dg Abd Acute W/chest  05/24/2013   CLINICAL DATA:  Right-side pain and vomiting for 1 day, history hypertension, diabetes  EXAM: ACUTE ABDOMEN SERIES (ABDOMEN 2 VIEW & CHEST 1 VIEW)  COMPARISON:  Chest radiograph 07/10/2012, abdominal radiographs 07/09/2012  FINDINGS: Normal heart size, mediastinal contours and pulmonary vascularity.  Atherosclerotic calcification aorta.  Emphysematous changes with biapical scarring.  No acute infiltrate, pleural effusion or pneumothorax.  Minimal atelectasis at right costophrenic angle.  Surgical clips right upper quadrant question cholecystectomy.  Anastomotic staples in right mid abdomen.  Nonobstructive bowel gas pattern.  No bowel dilatation bowel wall thickening, or free intraperitoneal air.  Bones unremarkable.  4 mm diameter left paraspinal calcification at the superior L4 level is not definitely seen on previous exam, cannot exclude a left ureteral calculus.  IMPRESSION: COPD changes with biapical scarring.  No acute infiltrates.  Normal bowel gas pattern.  Cannot exclude 4 mm left ureteral calculus; recommend correlation with urinalysis.   Electronically Signed   By: Lavonia Dana M.D.   On:  05/24/2013 18:42    Assessment and Plan: 80 y.o. male with uncontrollable vomiting. Suspect kidney stone versus severe viral gastroenteritis. Patient's age, general poor health and history of kidney stone or concerning. Plan to transfer to the emergency room for further evaluation and management.  Discussed warning signs or symptoms. Please see discharge instructions. Patient expresses understanding.    Gregor Hams, MD 05/24/13 Lurline Hare

## 2013-05-25 DIAGNOSIS — E039 Hypothyroidism, unspecified: Secondary | ICD-10-CM | POA: Diagnosis not present

## 2013-05-25 DIAGNOSIS — N183 Chronic kidney disease, stage 3 unspecified: Secondary | ICD-10-CM | POA: Diagnosis not present

## 2013-05-25 DIAGNOSIS — I10 Essential (primary) hypertension: Secondary | ICD-10-CM | POA: Diagnosis not present

## 2013-05-25 DIAGNOSIS — R1110 Vomiting, unspecified: Secondary | ICD-10-CM | POA: Insufficient documentation

## 2013-05-25 DIAGNOSIS — R112 Nausea with vomiting, unspecified: Principal | ICD-10-CM

## 2013-05-25 LAB — I-STAT TROPONIN, ED: Troponin i, poc: 0.02 ng/mL (ref 0.00–0.08)

## 2013-05-25 LAB — CBC
HCT: 34.9 % — ABNORMAL LOW (ref 39.0–52.0)
Hemoglobin: 11.7 g/dL — ABNORMAL LOW (ref 13.0–17.0)
MCH: 33.1 pg (ref 26.0–34.0)
MCHC: 33.5 g/dL (ref 30.0–36.0)
MCV: 98.6 fL (ref 78.0–100.0)
Platelets: 275 10*3/uL (ref 150–400)
RBC: 3.54 MIL/uL — ABNORMAL LOW (ref 4.22–5.81)
RDW: 14.9 % (ref 11.5–15.5)
WBC: 7.8 10*3/uL (ref 4.0–10.5)

## 2013-05-25 LAB — GLUCOSE, CAPILLARY
GLUCOSE-CAPILLARY: 141 mg/dL — AB (ref 70–99)
GLUCOSE-CAPILLARY: 93 mg/dL (ref 70–99)
GLUCOSE-CAPILLARY: 159 mg/dL — AB (ref 70–99)
Glucose-Capillary: 116 mg/dL — ABNORMAL HIGH (ref 70–99)

## 2013-05-25 LAB — BASIC METABOLIC PANEL
BUN: 34 mg/dL — AB (ref 6–23)
CALCIUM: 9 mg/dL (ref 8.4–10.5)
CO2: 27 mEq/L (ref 19–32)
CREATININE: 1.94 mg/dL — AB (ref 0.50–1.35)
Chloride: 103 mEq/L (ref 96–112)
GFR calc Af Amer: 33 mL/min — ABNORMAL LOW (ref >90)
GFR, EST NON AFRICAN AMERICAN: 29 mL/min — AB (ref >90)
GLUCOSE: 128 mg/dL — AB (ref 70–99)
Potassium: 3.2 mEq/L — ABNORMAL LOW (ref 3.7–5.3)
Sodium: 146 mEq/L (ref 137–147)

## 2013-05-25 LAB — TROPONIN I
Troponin I: <0.3 ng/mL (ref <0.30)
Troponin I: <0.3 ng/mL (ref <0.30)
Troponin I: <0.3 ng/mL (ref <0.30)

## 2013-05-25 LAB — MAGNESIUM: MAGNESIUM: 2.1 mg/dL (ref 1.5–2.5)

## 2013-05-25 MED ORDER — ACETAMINOPHEN 650 MG RE SUPP: 650.0000 mg | Freq: Four times a day (QID) | RECTAL | Status: DC | PRN

## 2013-05-25 MED ORDER — ONDANSETRON HCL 4 MG/2ML IJ SOLN: 4.0000 mg | Freq: Four times a day (QID) | INTRAMUSCULAR | Status: DC | PRN

## 2013-05-25 MED ORDER — TAMSULOSIN HCL 0.4 MG PO CAPS
0.4000 mg | ORAL_CAPSULE | Freq: Every day | ORAL | Status: DC
  Administered 2013-05-25: 0.4 mg via ORAL
  Filled 2013-05-25 (×2): qty 1

## 2013-05-25 MED ORDER — OLANZAPINE 5 MG PO TABS
5.0000 mg | ORAL_TABLET | Freq: Every day | ORAL | Status: DC
  Administered 2013-05-25 – 2013-05-26 (×2): 5 mg via ORAL
  Filled 2013-05-25 (×2): qty 1

## 2013-05-25 MED ORDER — HYDROMORPHONE HCL PF 1 MG/ML IJ SOLN: 0.5000 mg | INTRAMUSCULAR | Status: DC | PRN

## 2013-05-25 MED ORDER — DOCUSATE SODIUM 100 MG PO CAPS
100.0000 mg | ORAL_CAPSULE | Freq: Every day | ORAL | Status: DC
  Administered 2013-05-25: 100 mg via ORAL
  Filled 2013-05-25 (×2): qty 1

## 2013-05-25 MED ORDER — HYDRALAZINE HCL 25 MG PO TABS
12.5000 mg | ORAL_TABLET | Freq: Three times a day (TID) | ORAL | Status: DC
  Administered 2013-05-25 (×2): 12.5 mg via ORAL
  Filled 2013-05-25 (×6): qty 0.5

## 2013-05-25 MED ORDER — ACETAMINOPHEN 325 MG PO TABS: 650.0000 mg | ORAL_TABLET | Freq: Four times a day (QID) | ORAL | Status: DC | PRN

## 2013-05-25 MED ORDER — ALUM & MAG HYDROXIDE-SIMETH 200-200-20 MG/5ML PO SUSP: 30.0000 mL | Freq: Four times a day (QID) | ORAL | Status: DC | PRN

## 2013-05-25 MED ORDER — FENOFIBRATE 160 MG PO TABS
160.0000 mg | ORAL_TABLET | Freq: Every day | ORAL | Status: DC
  Administered 2013-05-25 – 2013-05-26 (×2): 160 mg via ORAL
  Filled 2013-05-25 (×2): qty 1

## 2013-05-25 MED ORDER — POTASSIUM CHLORIDE CRYS ER 20 MEQ PO TBCR
20.0000 meq | EXTENDED_RELEASE_TABLET | Freq: Once | ORAL | Status: AC
  Administered 2013-05-25: 20 meq via ORAL
  Filled 2013-05-25: qty 1

## 2013-05-25 MED ORDER — LEVOTHYROXINE SODIUM 100 MCG PO TABS
100.0000 ug | ORAL_TABLET | Freq: Every day | ORAL | Status: DC
  Administered 2013-05-26: 100 ug via ORAL
  Filled 2013-05-25 (×2): qty 1

## 2013-05-25 MED ORDER — SODIUM CHLORIDE 0.9 % IV SOLN
INTRAVENOUS | Status: DC
  Administered 2013-05-25: 04:00:00 via INTRAVENOUS

## 2013-05-25 MED ORDER — CITALOPRAM HYDROBROMIDE 20 MG PO TABS
20.0000 mg | ORAL_TABLET | Freq: Every day | ORAL | Status: DC
  Administered 2013-05-25 – 2013-05-26 (×2): 20 mg via ORAL
  Filled 2013-05-25 (×2): qty 1

## 2013-05-25 MED ORDER — ENOXAPARIN SODIUM 30 MG/0.3ML ~~LOC~~ SOLN
30.0000 mg | Freq: Every day | SUBCUTANEOUS | Status: DC
  Administered 2013-05-25: 30 mg via SUBCUTANEOUS
  Filled 2013-05-25 (×2): qty 0.3

## 2013-05-25 MED ORDER — HYDRALAZINE HCL 20 MG/ML IJ SOLN
10.0000 mg | Freq: Once | INTRAMUSCULAR | Status: AC
  Administered 2013-05-25: 10 mg via INTRAVENOUS
  Filled 2013-05-25: qty 1

## 2013-05-25 MED ORDER — OXYCODONE HCL 5 MG PO TABS: 5.0000 mg | ORAL_TABLET | ORAL | Status: DC | PRN

## 2013-05-25 MED ORDER — VITAMIN D3 25 MCG (1000 UNIT) PO TABS
1000.0000 [IU] | ORAL_TABLET | Freq: Every day | ORAL | Status: DC
  Administered 2013-05-25 – 2013-05-26 (×2): 1000 [IU] via ORAL
  Filled 2013-05-25 (×2): qty 1

## 2013-05-25 MED ORDER — PANCRELIPASE (LIP-PROT-AMYL) 12000-38000 UNITS PO CPEP
1.0000 | ORAL_CAPSULE | Freq: Three times a day (TID) | ORAL | Status: DC
  Administered 2013-05-25 – 2013-05-26 (×4): 1 via ORAL
  Filled 2013-05-25 (×7): qty 1

## 2013-05-25 MED ORDER — HYDRALAZINE HCL 20 MG/ML IJ SOLN: 10.0000 mg | Freq: Four times a day (QID) | INTRAMUSCULAR | Status: DC | PRN

## 2013-05-25 MED ORDER — SODIUM CHLORIDE 0.9 % IJ SOLN
3.0000 mL | Freq: Two times a day (BID) | INTRAMUSCULAR | Status: DC
  Administered 2013-05-25 (×3): 3 mL via INTRAVENOUS

## 2013-05-25 MED ORDER — BUPROPION HCL ER (XL) 150 MG PO TB24
150.0000 mg | ORAL_TABLET | Freq: Every day | ORAL | Status: DC
  Administered 2013-05-25 – 2013-05-26 (×2): 150 mg via ORAL
  Filled 2013-05-25 (×2): qty 1

## 2013-05-25 MED ORDER — PANTOPRAZOLE SODIUM 40 MG PO TBEC
40.0000 mg | DELAYED_RELEASE_TABLET | Freq: Every day | ORAL | Status: DC
  Administered 2013-05-25 – 2013-05-26 (×2): 40 mg via ORAL
  Filled 2013-05-25 (×2): qty 1

## 2013-05-25 MED ORDER — ONDANSETRON HCL 4 MG PO TABS: 4.0000 mg | ORAL_TABLET | Freq: Four times a day (QID) | ORAL | Status: DC | PRN

## 2013-05-25 MED ORDER — INSULIN ASPART 100 UNIT/ML ~~LOC~~ SOLN: 0.0000 [IU] | Freq: Three times a day (TID) | SUBCUTANEOUS | Status: DC

## 2013-05-25 NOTE — Progress Notes (Signed)
[facility] PROGRESS NOTE  Nathan Blackwell OJJ:[id number] DOB: 08/09/1920 DOA: 05/24/2013 PCP: [name], MD  Assessment/Plan: 1. Nausea and vomiting- supportive therapy, IV Anti-Emetics PRN,  Differential may include a Viral Gastroenteritis, or Gastroparesis, or Chronic Pancreatitis, may need GI referral for outpatient evaluation and motility studies.  Clear diet. Patient denies nausea or vomiting this am.   2. CKD Stage III/Renal Insufficiency- baseline cr=2's, Monitor Bun/Cr. Stable. Will NSL IV fluids.   3. DM2- Hold Januvia RX for now, SSI Coverage.   4. Hyperlipidemia- Continue fenofibrates.   5. HTN- Monitor BPs, IV Hydralazine PRN . Resume hydralazine.   6. Hypothyroid- Continue Levothyroxine.  7. DVT Prophylaxis with Lovenox.    Code Status: full code.  Family Communication: Care discussed with patient.  Disposition Plan: remain in patient.    Consultants:  none  Procedures:  none  Antibiotics:  none  HPI/Subjective: Feeling better. No nausea or vomiting.   Objective: Filed Vitals:   05/25/13 0500  BP: 141/41  Pulse: 66  Temp: 97.3 F (36.3 C)  Resp: 18    Intake/Output Summary (Last 24 hours) at 05/25/13 1004 Last data filed at 05/25/13 0828  Gross per 24 hour  Intake      0 ml  Output    325 ml  Net   -325 ml   Filed Weights   05/24/13 1951 05/25/13 0232  Weight: 67.43 kg (148 lb 10.5 oz) 64.9 kg (143 lb 1.3 oz)    Exam:   General:  No distress.   Cardiovascular: S 1, S 2 RRR  Respiratory: CTA  Abdomen: BS present, soft, NT  Musculoskeletal: no edema.   Data Reviewed: Basic Metabolic Panel:  Recent Labs Lab 05/24/13 1850 05/24/13 2212 05/25/13 0018 05/25/13 0740  NA 143 140  --  146  K 3.7 3.7  --  3.2*  CL 101 97  --  103  CO2  --  26  --  27  GLUCOSE 161* 148*  --  128*  BUN 33* 36*  --  34*  CREATININE 2.40* 2.02*  --  1.94*  CALCIUM  --  9.7  --  9.0  MG  --   --  2.1  --    Liver Function  Tests:  Recent Labs Lab 05/24/13 2212  AST 34  ALT 19  ALKPHOS 47  BILITOT 0.6  PROT 7.2  ALBUMIN 3.8    Recent Labs Lab 05/24/13 2212  LIPASE 50   No results found for this basename: AMMONIA,  in the last 168 hours CBC:  Recent Labs Lab 05/24/13 1850 05/24/13 2212 05/25/13 0740  WBC  --  9.2 7.8  NEUTROABS  --  8.0*  --   HGB 15.0 13.1 11.7*  HCT 44.0 38.2* 34.9*  MCV  --  98.2 98.6  PLT  --  290 275   Cardiac Enzymes:  Recent Labs Lab 05/25/13 0203 05/25/13 0740  TROPONINI <0.30 <0.30   BNP (last 3 results) No results found for this basename: PROBNP,  in the last 8760 hours CBG:  Recent Labs Lab 05/25/13 0239  GLUCAP 141*    No results found for this or any previous visit (from the past 240 hour(s)).   Studies: Ct Abdomen Pelvis Wo Contrast  05/24/2013   CLINICAL DATA:  Right-sided flank pain and nausea.  EXAM: CT ABDOMEN AND PELVIS WITHOUT CONTRAST  TECHNIQUE: Multidetector CT imaging of the abdomen and pelvis was performed following the standard protocol without intravenous contrast.  COMPARISON:  DG ABD ACUTE W/CHEST dated 05/24/2013; CT ABD/PELV WO CM dated 09/23/2010  FINDINGS: Lung bases are clear.  No pericardial fluid.  Several low-density lesions in the liver cannot be fully characterized without IV contrast. Post cholecystectomy. The pancreas, spleen, adrenal glands are normal.  There is no nephrolithiasis or ureterolithiasis. Bilateral renal cortical thinning. There are bilateral exophytic simple fluid attenuation cystic lesions involving both the left right kidney which cannot be fully characterized but statistically represent benign cysts.  There is a moderate hiatal hernia. Stomach, small bowel wall normal. There is enteric colonic anastomosis on the right colon. . Colon and rectosigmoid are predominantly collapsed. No obstructing lesion identified. Abdominal aorta is normal caliber. No retroperitoneal periportal lymphadenopathy.  No free fluid the  pelvis. No distal ureteral stones or bladder stones. The prostate gland is enlarged. No pelvic lymphadenopathy.  No aggressive osseous lesion.  IMPRESSION: 1. No nephrolithiasis, ureterolithiasis, or obstructive uropathy. 2. Bilateral simple fluid attenuation renal lesions likely represent benign cyst. 3. Right-sided bowel anastomosis without evidence of complicating features. 4. Prostate hypertrophy.   Electronically Signed   By: Suzy Bouchard M.D.   On: 05/24/2013 22:49   Dg Abd Acute W/chest  05/24/2013   CLINICAL DATA:  Right-side pain and vomiting for 1 day, history hypertension, diabetes  EXAM: ACUTE ABDOMEN SERIES (ABDOMEN 2 VIEW & CHEST 1 VIEW)  COMPARISON:  Chest radiograph 07/10/2012, abdominal radiographs 07/09/2012  FINDINGS: Normal heart size, mediastinal contours and pulmonary vascularity.  Atherosclerotic calcification aorta.  Emphysematous changes with biapical scarring.  No acute infiltrate, pleural effusion or pneumothorax.  Minimal atelectasis at right costophrenic angle.  Surgical clips right upper quadrant question cholecystectomy.  Anastomotic staples in right mid abdomen.  Nonobstructive bowel gas pattern.  No bowel dilatation bowel wall thickening, or free intraperitoneal air.  Bones unremarkable.  4 mm diameter left paraspinal calcification at the superior L4 level is not definitely seen on previous exam, cannot exclude a left ureteral calculus.  IMPRESSION: COPD changes with biapical scarring.  No acute infiltrates.  Normal bowel gas pattern.  Cannot exclude 4 mm left ureteral calculus; recommend correlation with urinalysis.   Electronically Signed   By: Lavonia Dana M.D.   On: 05/24/2013 18:42    Scheduled Meds: . buPROPion  150 mg Oral Daily  . cholecalciferol  1,000 Units Oral Daily  . citalopram  20 mg Oral Daily  . docusate sodium  100 mg Oral QHS  . enoxaparin (LOVENOX) injection  30 mg Subcutaneous Daily  . fenofibrate  160 mg Oral Daily  . insulin aspart  0-9 Units  Subcutaneous TID WC  . lipase/protease/amylase  1 capsule Oral TID WC  . OLANZapine  5 mg Oral Daily  . pantoprazole  40 mg Oral Daily  . potassium chloride  20 mEq Oral Once  . sodium chloride  3 mL Intravenous Q12H  . tamsulosin  0.4 mg Oral QHS   Continuous Infusions:   Principal Problem:   Nausea and vomiting Active Problems:   HYPOTHYROIDISM   DIABETES MELLITUS, TYPE II   HYPERLIPIDEMIA   HYPERTENSION   RENAL INSUFFICIENCY    Time spent: 35 minutes.     Niel Hummer A  [facility] Pager [phone]. If 7PM-7AM, please contact night-coverage at [email or web address], password [facility] 05/25/2013, 10:04 AM  LOS: 1 day

## 2013-05-25 NOTE — ED Provider Notes (Signed)
I saw and evaluated the patient, reviewed the resident's note and I agree with the findings and plan.   EKG Interpretation None      Pt w persistent/recurrent nv.  Denies abd pain. abd soft nt. Afeb. Labs. Ivf.     Mirna Mires, MD 05/25/13 [phone]

## 2013-05-25 NOTE — ED Notes (Signed)
Edmon Crape daughter of Mr. Jiles has requested that the hospital staff call her should anything change, or for patient pickup. [phone]

## 2013-05-25 NOTE — ED Notes (Signed)
Transporting patient to new room assignment. 

## 2013-05-25 NOTE — H&P (Signed)
[facility] History and Physical  HAL BOWERSOCK [id number] DOB: [birth year] DOA: 05/24/2013  Referring physician: EDP PCP: [name], MD  Specialists:   Chief Complaint: Nausea and Vomiting  HPI: Nathan Blackwell is a 80 y.o. male with a history of DM2, HTN, and Hyperlipidemia who presents to the ED with complaints of nausea vomiting and Right Flank and ABD pain since the AM.   He denies hematemesis , diarrhea, constipation, or fevers or chills.   He reports that he has bouts of nausea and vomiting several times a year.  He reports that he has not been able to hold down food liquids or medcations today.   In the ED, there was concern for a possible Kidney stone due to the KUB findings, so a ct scan of the ABD was performed and ruled out a kidney stone.  Of Note the patient does have a remote history of kidney stones.   He was medicated for his ABD and Flank Pain and the pain resolved.      Review of Systems:  Constitutional: No Weight Loss, No Weight Gain, Night Sweats, Fevers, Chills, Fatigue, or Generalized Weakness HEENT: No Headaches, Difficulty Swallowing,Tooth/Dental Problems,Sore Throat,  No Sneezing, Rhinitis, Ear Ache, Nasal Congestion, or Post Nasal Drip,  Cardio-vascular:  No Chest pain, Orthopnea, PND, Edema in lower extremities, Anasarca, Dizziness, Palpitations  Resp: No Dyspnea, No DOE, No Productive Cough, No Non-Productive Cough, No Hemoptysis, No Change in Color of Mucus,  No Wheezing.    GI: No Heartburn, Indigestion, +Abdominal Pain,+ Nausea,+ Vomiting, Diarrhea, Change in Bowel Habits,  Loss of Appetite  GU: No Dysuria, Change in Color of Urine, No Urgency or Frequency.  No flank pain.  Musculoskeletal: No Joint Pain or Swelling.  No Decreased Range of Motion. No Back Pain.  Neurologic: No Syncope, No Seizures, Muscle Weakness, Paresthesia, Vision Disturbance or Loss, No Diplopia, No Vertigo, No Difficulty Walking,  Skin: No Rash or Lesions. Psych: No Change in  Mood or Affect. No Depression or Anxiety. No Memory loss. No Confusion or Hallucinations   Past Medical History  Diagnosis Date  . Diabetes mellitus type II   . Hyperlipidemia   . Hypertension   . Osteoporosis   . Prostatic hypertrophy     benign  . OSA (obstructive sleep apnea)   . Rotator cuff syndrome     chronic  . Elevated PSA   . Hypothyroidism   . Nephrolithiasis   . Thyroid nodule   . GERD (gastroesophageal reflux disease)   . Colitis, Clostridium difficile     presumed 11/08  . Dementia   . Anemia     NOS  . Vitamin B 12 deficiency   . BRADYCARDIA 06/10/2009  . COLON CANCER, HX OF 09/20/2006  . HYPOTHYROIDISM 01/14/2007  . DIABETES MELLITUS, TYPE II 09/20/2006  . HYPERLIPIDEMIA 09/20/2006  . ANEMIA-NOS 06/23/2008  . DEMENTIA 05/14/2007  . DEPRESSION 02/27/2007  . SLEEP APNEA, OBSTRUCTIVE 01/14/2007  . HYPERTENSION 09/20/2006  . GERD 01/14/2007  . OSTEOPOROSIS 10/08/2006  . PERIPHERAL EDEMA 09/03/2007  . RENAL INSUFFICIENCY 04/01/2010  . B12 deficiency 05/25/2010  . CLOSTRIDIUM DIFFICILE COLITIS 01/30/2007  . THYROID NODULE 01/14/2007  . CONSTIPATION, RECURRENT 01/06/2010  . BENIGN PROSTATIC HYPERTROPHY 10/08/2006  . ARTHRITIS 02/23/2007  . BACK PAIN, THORACIC REGION 06/04/2008  . COLONIC POLYPS, HX OF 10/08/2006  . NEPHROLITHIASIS, HX OF 01/14/2007  . Cancer of colon dx'd 1998  . Cancer of vocal cord dx'd 1998  . 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  DVT of lower limb, acute 01/12/2011  . Sleep apnea     CPAP dependent      Past Surgical History  Procedure Laterality Date  . Cholecystectomy  1999  . Inguinal herniorrhapy  1984  . Appendectomy  1998  . Colon resection  1998  . Skin cancer extraction    . Cataract extraction, bilateral         Prior to Admission medications   Medication Sig Start Date End Date Taking? Authorizing Provider  acetaminophen (TYLENOL) 500 MG tablet Take 1 tablet (500 mg total) by mouth every 6 (six) hours as needed for pain.  09/04/12  Yes Domenic Moras, PA-C  buPROPion (WELLBUTRIN XL) 150 MG 24 hr tablet Take 150 mg by mouth daily.   Yes Historical Provider, MD  cholecalciferol (VITAMIN D) 1000 UNITS tablet Take 1,000 Units by mouth daily.   Yes Historical Provider, MD  citalopram (CELEXA) 20 MG tablet Take 20 mg by mouth daily.   Yes Historical Provider, MD  docusate sodium (COLACE) 100 MG capsule Take 100 mg by mouth at bedtime.     Yes Historical Provider, MD  fenofibrate 160 MG tablet Take 160 mg by mouth daily.   Yes Historical Provider, MD  furosemide (LASIX) 20 MG tablet Take 20-40 mg by mouth 2 (two) times daily. 40mg  in the morning; 20mg  in the afternoon   Yes Historical Provider, MD  hydrALAZINE (APRESOLINE) 25 MG tablet Take 12.5 mg by mouth 3 (three) times daily.   Yes Historical Provider, MD  lansoprazole (PREVACID) 30 MG capsule Take 30 mg by mouth daily.   Yes Historical Provider, MD  levothyroxine (SYNTHROID, LEVOTHROID) 100 MCG tablet Take 100 mcg by mouth daily before breakfast.   Yes Historical Provider, MD  lipase/protease/amylase (CREON-12/PANCREASE) 12000 UNITS CPEP Take 1 capsule by mouth 3 (three) times daily with meals.    Yes Historical Provider, MD  OLANZapine (ZYPREXA) 5 MG tablet Take 5 mg by mouth daily.   Yes Historical Provider, MD  sitaGLIPtin (JANUVIA) 50 MG tablet Take 50 mg by mouth daily.   Yes Historical Provider, MD  tamsulosin (FLOMAX) 0.4 MG CAPS capsule Take 0.4 mg by mouth at bedtime.   Yes Historical Provider, MD      Allergies  Allergen Reactions  . Biaxin [Clarithromycin] Other (See Comments)    Unknown   . Other Other (See Comments)    Nephrologist has told patient not to eat many different foods (tomatoes, Green Foods, etc).   . Sulfa Antibiotics Other (See Comments)    Unknown      Social History:  reports that he has never smoked. He has never used smokeless tobacco. He reports that he does not drink alcohol or use illicit drugs.     Family History  Problem  Relation Age of Onset  . Diabetes Mother   . Stroke Father        Physical Exam:  GEN: Pleasant Well Nourished and Well Developed Elderly 80 y.o. Caucasian male examined  and in no acute distress; cooperative with exam Filed Vitals:   05/25/13 0100 05/25/13 0159 05/25/13 0232 05/25/13 0500  BP: 182/73 205/59 194/69 141/41  Pulse: 57  60 66  Temp:   97.5 F (36.4 C) 97.3 F (36.3 C)  TempSrc:   Oral   Resp: 19  18 18   Height:   5' 8.5" (1.74 m)   Weight:   64.9 kg (143 lb 1.3 oz)   SpO2: 99%  100% 98%   Blood pressure  141/41, pulse 66, temperature 97.3 F (36.3 C), temperature source Oral, resp. rate 18, height 5' 8.5" (1.74 m), weight 64.9 kg (143 lb 1.3 oz), SpO2 98.00%. PSYCH: He is alert and oriented x4; does not appear anxious does not appear depressed; affect is normal HEENT: Normocephalic and Atraumatic, Mucous membranes pink; PERRLA; EOM intact; Fundi:  Benign;  No scleral icterus, Nares: Patent, Oropharynx: Clear,  Fair Dentition, Neck:  FROM, no cervical lymphadenopathy nor thyromegaly or carotid bruit; no JVD; Breasts:: Not examined CHEST WALL: No tenderness CHEST: Normal respiration, clear to auscultation bilaterally HEART: Regular rate and rhythm; no murmurs rubs or gallops BACK: No kyphosis or scoliosis; no CVA tenderness ABDOMEN: Positive Bowel Sounds, soft non-tender; no masses, no organomegaly. Rectal Exam: Not done EXTREMITIES: No cyanosis, clubbing or edema; no ulcerations. Genitalia: not examined PULSES: 2+ and symmetric SKIN: Normal hydration no rash or ulceration CNS:  Vascular: pulses palpable throughout    Labs on Admission:  Basic Metabolic Panel:  Recent Labs Lab 05/24/13 1850 05/24/13 2212 05/25/13 0018  NA 143 140  --   K 3.7 3.7  --   CL 101 97  --   CO2  --  26  --   GLUCOSE 161* 148*  --   BUN 33* 36*  --   CREATININE 2.40* 2.02*  --   CALCIUM  --  9.7  --   MG  --   --  2.1   Liver Function Tests:  Recent Labs Lab  05/24/13 2212  AST 34  ALT 19  ALKPHOS 47  BILITOT 0.6  PROT 7.2  ALBUMIN 3.8    Recent Labs Lab 05/24/13 2212  LIPASE 50   No results found for this basename: AMMONIA,  in the last 168 hours CBC:  Recent Labs Lab 05/24/13 1850 05/24/13 2212  WBC  --  9.2  NEUTROABS  --  8.0*  HGB 15.0 13.1  HCT 44.0 38.2*  MCV  --  98.2  PLT  --  290   Cardiac Enzymes:  Recent Labs Lab 05/25/13 0203  TROPONINI <0.30    BNP (last 3 results) No results found for this basename: PROBNP,  in the last 8760 hours CBG:  Recent Labs Lab 05/25/13 0239  GLUCAP 141*    Radiological Exams on Admission: Ct Abdomen Pelvis Wo Contrast  05/24/2013   CLINICAL DATA:  Right-sided flank pain and nausea.  EXAM: CT ABDOMEN AND PELVIS WITHOUT CONTRAST  TECHNIQUE: Multidetector CT imaging of the abdomen and pelvis was performed following the standard protocol without intravenous contrast.  COMPARISON:  DG ABD ACUTE W/CHEST dated 05/24/2013; CT ABD/PELV WO CM dated 09/23/2010  FINDINGS: Lung bases are clear.  No pericardial fluid.  Several low-density lesions in the liver cannot be fully characterized without IV contrast. Post cholecystectomy. The pancreas, spleen, adrenal glands are normal.  There is no nephrolithiasis or ureterolithiasis. Bilateral renal cortical thinning. There are bilateral exophytic simple fluid attenuation cystic lesions involving both the left right kidney which cannot be fully characterized but statistically represent benign cysts.  There is a moderate hiatal hernia. Stomach, small bowel wall normal. There is enteric colonic anastomosis on the right colon. . Colon and rectosigmoid are predominantly collapsed. No obstructing lesion identified. Abdominal aorta is normal caliber. No retroperitoneal periportal lymphadenopathy.  No free fluid the pelvis. No distal ureteral stones or bladder stones. The prostate gland is enlarged. No pelvic lymphadenopathy.  No aggressive osseous lesion.   IMPRESSION: 1. No nephrolithiasis, ureterolithiasis, or obstructive uropathy. 2. Bilateral  simple fluid attenuation renal lesions likely represent benign cyst. 3. Right-sided bowel anastomosis without evidence of complicating features. 4. Prostate hypertrophy.   Electronically Signed   By: Suzy Bouchard M.D.   On: 05/24/2013 22:49   Dg Abd Acute W/chest  05/24/2013   CLINICAL DATA:  Right-side pain and vomiting for 1 day, history hypertension, diabetes  EXAM: ACUTE ABDOMEN SERIES (ABDOMEN 2 VIEW & CHEST 1 VIEW)  COMPARISON:  Chest radiograph 07/10/2012, abdominal radiographs 07/09/2012  FINDINGS: Normal heart size, mediastinal contours and pulmonary vascularity.  Atherosclerotic calcification aorta.  Emphysematous changes with biapical scarring.  No acute infiltrate, pleural effusion or pneumothorax.  Minimal atelectasis at right costophrenic angle.  Surgical clips right upper quadrant question cholecystectomy.  Anastomotic staples in right mid abdomen.  Nonobstructive bowel gas pattern.  No bowel dilatation bowel wall thickening, or free intraperitoneal air.  Bones unremarkable.  4 mm diameter left paraspinal calcification at the superior L4 level is not definitely seen on previous exam, cannot exclude a left ureteral calculus.  IMPRESSION: COPD changes with biapical scarring.  No acute infiltrates.  Normal bowel gas pattern.  Cannot exclude 4 mm left ureteral calculus; recommend correlation with urinalysis.   Electronically Signed   By: Lavonia Dana M.D.   On: 05/24/2013 18:42     EKG: Independently reviewed. Sinus Bradycardia at 59, No acute S-T changes.      Assessment/Plan:   80 y.o. male with  Principal Problem:   Nausea and vomiting Active Problems:   HYPOTHYROIDISM   DIABETES MELLITUS, TYPE II   HYPERLIPIDEMIA   HYPERTENSION   RENAL INSUFFICIENCY     1.   Nausea and vomiting- supportive therapy, IV Anti-Emetics PRN, and IVFs for rehydration, and Clear liquids to advance as  tolerated, Differential may include a Viral Gastroenteritis, or Gastroparesis, or  Chronic Pancreatitis, may need GI referral for outpatient evaluation and motility studies.     2.   CKD Stage III/Renal Insufficiency-  baseline cr=2's,  Monitor Bun/Cr.     3.   DM2-     Hold Januvia RX for now,  SSI Coverage PRN and check HbA1C level.     4.   Hyperlipidemia-    Continue Omega 3 Fatty Acids, and   5.  HTN-  Monitor BPs, IV Hydralazine PRN  While unable to hold down food.    6.   Hypothyroid-  Continue Levothyroid Rx,  Check TSH level.    7.  DVT Prophylaxis with Lovenox.       Code Status:   FULL CODE Family Communication:    No Family at bedside Disposition Plan:    Inpatient  Time spent:  [place] [facility] Pager [phone]  If 7PM-7AM, please contact night-coverage [email or web address] Password [facility] 05/25/2013, 9:01 AM

## 2013-05-26 ENCOUNTER — Telehealth: Payer: Self-pay | Admitting: *Deleted

## 2013-05-26 DIAGNOSIS — R112 Nausea with vomiting, unspecified: Secondary | ICD-10-CM | POA: Diagnosis not present

## 2013-05-26 DIAGNOSIS — N183 Chronic kidney disease, stage 3 unspecified: Secondary | ICD-10-CM | POA: Diagnosis not present

## 2013-05-26 LAB — BASIC METABOLIC PANEL
BUN: 37 mg/dL — AB (ref 6–23)
CHLORIDE: 104 meq/L (ref 96–112)
CO2: 27 mEq/L (ref 19–32)
Calcium: 8.7 mg/dL (ref 8.4–10.5)
Creatinine, Ser: 2.11 mg/dL — ABNORMAL HIGH (ref 0.50–1.35)
GFR, EST AFRICAN AMERICAN: 30 mL/min — AB (ref >90)
GFR, EST NON AFRICAN AMERICAN: 26 mL/min — AB (ref >90)
Glucose, Bld: 87 mg/dL (ref 70–99)
Potassium: 3.5 mEq/L — ABNORMAL LOW (ref 3.7–5.3)
Sodium: 142 mEq/L (ref 137–147)

## 2013-05-26 LAB — GLUCOSE, CAPILLARY: Glucose-Capillary: 96 mg/dL (ref 70–99)

## 2013-05-26 MED ORDER — POLYETHYLENE GLYCOL 3350 17 G PO PACK
17.0000 g | PACK | Freq: Two times a day (BID) | ORAL | Status: DC
  Filled 2013-05-26 (×2): qty 1

## 2013-05-26 MED ORDER — POTASSIUM CHLORIDE CRYS ER 20 MEQ PO TBCR
20.0000 meq | EXTENDED_RELEASE_TABLET | Freq: Two times a day (BID) | ORAL | Status: DC
  Administered 2013-05-26: 20 meq via ORAL
  Filled 2013-05-26: qty 1

## 2013-05-26 MED ORDER — POLYETHYLENE GLYCOL 3350 17 G PO PACK: 17.0000 g | PACK | Freq: Two times a day (BID) | ORAL | Status: DC

## 2013-05-26 NOTE — Telephone Encounter (Signed)
[facility] Triage Call Report Triage Record Num: [id number] Operator: Agustina Caroli Patient Name: Keyshaun Exley Call Date & Time: 05/24/2013 4:24:05PM Patient Phone: [phone] PCP: [name] Patient Gender: Male PCP Fax : [phone] Patient DOB: [birth year] Practice Name: [facility] Reason for Call: Caller: Cleda Clarks; PCP: [name] (Adults only); CB#: [phone]; Call regarding Vomiting; Wife/ [name] states Chidi had onset of vomiting at 09:00 - Unsure how may times. Emesis was dark brown fluid with dark brown particles. Blood sugar was 144 @ 11:00. Took Phenergan and was able to sleep. Had onset of vomiting again at 15:00. Has vomited 3 to 4 times. Dry mouth. Not drinking. Had three formed stools- not diarrhea. Started new eye drop Systane on 05/23/13. Per diabetes gastointestinal problems has ED disposition due to vomiting multiple times. Advised Mosed Alva/UC. Care advice given. Protocol(s) Used: Diabetes: Gastrointestinal Problems Protocol(s) Used: Nausea or Vomiting Recommended Outcome per Protocol: See ED Immediately Reason for Outcome: Known diabetic Vomiting multiple times OR unable to keep fluids down for more than 2 hours Care Advice: Call EMS 911 if patient develops sudden shortness of breath, vomiting blood or coffee ground material, pain in chest, neck, jaw, arm, or upper back, or signs of shock. ~ ~ IMMEDIATE ACTION 03/

## 2013-05-26 NOTE — Discharge Summary (Signed)
Physician Discharge Summary  IZEN PETZ BOF:[id number] DOB: 06/16/1920 DOA: 05/24/2013  PCP: [name], MD  Admit date: 05/24/2013 Discharge date: 05/26/2013  Time spent: 35 minutes  Recommendations for Outpatient Follow-up:  1. Need referral to Gastroenterologist for recurrent nausea and vomiting.  2. Need B-met to follow renal function.   Discharge Diagnoses:    Nausea and vomiting   HYPOTHYROIDISM   DIABETES MELLITUS, TYPE II   HYPERLIPIDEMIA   HYPERTENSION   RENAL INSUFFICIENCY   Discharge Condition: Stable  Diet recommendation: Heart Healthy  Filed Weights   05/24/13 1951 05/25/13 0232  Weight: 67.43 kg (148 lb 10.5 oz) 64.9 kg (143 lb 1.3 oz)    History of present illness:  Nathan Blackwell is a 80 y.o. male with a history of DM2, HTN, and Hyperlipidemia who presents to the ED with complaints of nausea vomiting and Right Flank and ABD pain since the AM. He denies hematemesis , diarrhea, constipation, or fevers or chills. He reports that he has bouts of nausea and vomiting several times a year. He reports that he has not been able to hold down food liquids or medcations today. In the ED, there was concern for a possible Kidney stone due to the KUB findings, so a ct scan of the ABD was performed and ruled out a kidney stone. Of Note the patient does have a remote history of kidney stones. He was medicated for his ABD and Flank Pain and the pain resolved.    Hospital Course:  1. Nausea and vomiting- supportive therapy, IV Anti-Emetics PRN, Resolved.  Differential may include a Viral Gastroenteritis, or Gastroparesis, or Chronic Pancreatitis, may need GI referral for outpatient evaluation and motility studies.  Patient tolerates diet. No further episode.   2. CKD Stage III/Renal Insufficiency- baseline cr=2's, Monitor Bun/Cr. Stable. Will NSL IV fluids. Resume lasix at discharge.   3. DM2- Hold Januvia RX for now, SSI Coverage.   4. Hyperlipidemia- Continue fenofibrates.   5. HTN-  Resume hydralazine.  6. Hypothyroid- Continue Levothyroxine.  7. DVT Prophylaxis with Lovenox 8-Bradycardia; patient HR at 60 on telemetry. Asymptomatic.   Procedures:  none  Consultations:  none  Discharge Exam: Filed Vitals:   05/26/13 0754  BP: 139/55  Pulse: 46  Temp: 97.9 F (36.6 C)  Resp: 18    General: No distress.  Cardiovascular: S 1, S 2 RRR Respiratory: CTA  Discharge Instructions  Discharge Orders   Future Appointments Provider Department Dept Phone   06/02/2013 10:00 AM [facility] [place] [phone]   07/03/2013 10:45 AM Audelia Acton [facility] [phone]   09/30/2013 10:00 AM Kathee Delton, MD [facility] [phone]   10/30/2013 10:30 AM Biagio Borg, MD [place]   Future Orders Complete By Expires   Diet Carb Modified  As directed    Increase activity slowly  As directed        Medication List         acetaminophen 500 MG tablet  Commonly known as:  TYLENOL  Take 1 tablet (500 mg total) by mouth every 6 (six) hours as needed for pain.     buPROPion 150 MG 24 hr tablet  Commonly known as:  WELLBUTRIN XL  Take 150 mg by mouth daily.     cholecalciferol 1000 UNITS tablet  Commonly known as:  VITAMIN D  Take 1,000 Units by mouth daily.     citalopram  20 MG tablet  Commonly known as:  CELEXA  Take 20 mg by mouth daily.     docusate sodium 100 MG capsule  Commonly known as:  COLACE  Take 100 mg by mouth at bedtime.     fenofibrate 160 MG tablet  Take 160 mg by mouth daily.     furosemide 20 MG tablet  Commonly known as:  LASIX  Take 20-40 mg by mouth 2 (two) times daily. 75m in the morning; 253min the afternoon     hydrALAZINE 25 MG tablet  Commonly known as:  APRESOLINE  Take 12.5 mg by mouth 3 (three) times daily.     lansoprazole 30 MG capsule  Commonly known as:  PREVACID  Take 30 mg by  mouth daily.     levothyroxine 100 MCG tablet  Commonly known as:  SYNTHROID, LEVOTHROID  Take 100 mcg by mouth daily before breakfast.     lipase/protease/amylase 12000 UNITS Cpep capsule  Commonly known as:  CREON-12/PANCREASE  Take 1 capsule by mouth 3 (three) times daily with meals.     OLANZapine 5 MG tablet  Commonly known as:  ZYPREXA  Take 5 mg by mouth daily.     polyethylene glycol packet  Commonly known as:  MIRALAX / GLYCOLAX  Take 17 g by mouth 2 (two) times daily.     sitaGLIPtin 50 MG tablet  Commonly known as:  JANUVIA  Take 50 mg by mouth daily.     tamsulosin 0.4 MG Caps capsule  Commonly known as:  FLOMAX  Take 0.4 mg by mouth at bedtime.       Allergies  Allergen Reactions  . Biaxin [Clarithromycin] Other (See Comments)    Unknown   . Other Other (See Comments)    Nephrologist has told patient not to eat many different foods (tomatoes, Green Foods, etc).   . Sulfa Antibiotics Other (See Comments)    Unknown        Follow-up Information   Follow up with JaCathlean CowerMD In 1 week.   Specialties:  Internal Medicine, Radiology   Contact information:   [address] [phone]      The results of significant diagnostics from this hospitalization (including imaging, microbiology, ancillary and laboratory) are listed below for reference.    Significant Diagnostic Studies: Ct Abdomen Pelvis Wo Contrast  05/24/2013   CLINICAL DATA:  Right-sided flank pain and nausea.  EXAM: CT ABDOMEN AND PELVIS WITHOUT CONTRAST  TECHNIQUE: Multidetector CT imaging of the abdomen and pelvis was performed following the standard protocol without intravenous contrast.  COMPARISON:  DG ABD ACUTE W/CHEST dated 05/24/2013; CT ABD/PELV WO CM dated 09/23/2010  FINDINGS: Lung bases are clear.  No pericardial fluid.  Several low-density lesions in the liver cannot be fully characterized without IV contrast. Post cholecystectomy. The pancreas,  spleen, adrenal glands are normal.  There is no nephrolithiasis or ureterolithiasis. Bilateral renal cortical thinning. There are bilateral exophytic simple fluid attenuation cystic lesions involving both the left right kidney which cannot be fully characterized but statistically represent benign cysts.  There is a moderate hiatal hernia. Stomach, small bowel wall normal. There is enteric colonic anastomosis on the right colon. . Colon and rectosigmoid are predominantly collapsed. No obstructing lesion identified. Abdominal aorta is normal caliber. No retroperitoneal periportal lymphadenopathy.  No free fluid the pelvis. No distal ureteral stones or bladder stones. The prostate gland is enlarged. No pelvic lymphadenopathy.  No aggressive osseous  lesion.  IMPRESSION: 1. No nephrolithiasis, ureterolithiasis, or obstructive uropathy. 2. Bilateral simple fluid attenuation renal lesions likely represent benign cyst. 3. Right-sided bowel anastomosis without evidence of complicating features. 4. Prostate hypertrophy.   Electronically Signed   By: Suzy Bouchard M.D.   On: 05/24/2013 22:49   Dg Abd Acute W/chest  05/24/2013   CLINICAL DATA:  Right-side pain and vomiting for 1 day, history hypertension, diabetes  EXAM: ACUTE ABDOMEN SERIES (ABDOMEN 2 VIEW & CHEST 1 VIEW)  COMPARISON:  Chest radiograph 07/10/2012, abdominal radiographs 07/09/2012  FINDINGS: Normal heart size, mediastinal contours and pulmonary vascularity.  Atherosclerotic calcification aorta.  Emphysematous changes with biapical scarring.  No acute infiltrate, pleural effusion or pneumothorax.  Minimal atelectasis at right costophrenic angle.  Surgical clips right upper quadrant question cholecystectomy.  Anastomotic staples in right mid abdomen.  Nonobstructive bowel gas pattern.  No bowel dilatation bowel wall thickening, or free intraperitoneal air.  Bones unremarkable.  4 mm diameter left paraspinal calcification at the superior L4 level is not  definitely seen on previous exam, cannot exclude a left ureteral calculus.  IMPRESSION: COPD changes with biapical scarring.  No acute infiltrates.  Normal bowel gas pattern.  Cannot exclude 4 mm left ureteral calculus; recommend correlation with urinalysis.   Electronically Signed   By: Lavonia Dana M.D.   On: 05/24/2013 18:42    Microbiology: No results found for this or any previous visit (from the past 240 hour(s)).   Labs: Basic Metabolic Panel:  Recent Labs Lab 05/24/13 1850 05/24/13 2212 05/25/13 0018 05/25/13 0740 05/26/13 0647  NA 143 140  --  146 142  K 3.7 3.7  --  3.2* 3.5*  CL 101 97  --  103 104  CO2  --  26  --  27 27  GLUCOSE 161* 148*  --  128* 87  BUN 33* 36*  --  34* 37*  CREATININE 2.40* 2.02*  --  1.94* 2.11*  CALCIUM  --  9.7  --  9.0 8.7  MG  --   --  2.1  --   --    Liver Function Tests:  Recent Labs Lab 05/24/13 2212  AST 34  ALT 19  ALKPHOS 47  BILITOT 0.6  PROT 7.2  ALBUMIN 3.8    Recent Labs Lab 05/24/13 2212  LIPASE 50   No results found for this basename: AMMONIA,  in the last 168 hours CBC:  Recent Labs Lab 05/24/13 1850 05/24/13 2212 05/25/13 0740  WBC  --  9.2 7.8  NEUTROABS  --  8.0*  --   HGB 15.0 13.1 11.7*  HCT 44.0 38.2* 34.9*  MCV  --  98.2 98.6  PLT  --  290 275   Cardiac Enzymes:  Recent Labs Lab 05/25/13 0203 05/25/13 0740 05/25/13 1426  TROPONINI <0.30 <0.30 <0.30   BNP: BNP (last 3 results) No results found for this basename: PROBNP,  in the last 8760 hours CBG:  Recent Labs Lab 05/25/13 0239 05/25/13 1204 05/25/13 1640 05/25/13 2110 05/26/13 0737  GLUCAP 141* 116* 93 159* 96       Signed:  Regalado, Belkys A  [facility] 05/26/2013, 9:43 AM

## 2013-05-27 DIAGNOSIS — D631 Anemia in chronic kidney disease: Secondary | ICD-10-CM | POA: Diagnosis not present

## 2013-05-27 DIAGNOSIS — N039 Chronic nephritic syndrome with unspecified morphologic changes: Secondary | ICD-10-CM | POA: Diagnosis not present

## 2013-05-27 DIAGNOSIS — N184 Chronic kidney disease, stage 4 (severe): Secondary | ICD-10-CM | POA: Diagnosis not present

## 2013-05-27 DIAGNOSIS — N2581 Secondary hyperparathyroidism of renal origin: Secondary | ICD-10-CM | POA: Diagnosis not present

## 2013-05-27 DIAGNOSIS — I10 Essential (primary) hypertension: Secondary | ICD-10-CM | POA: Diagnosis not present

## 2013-05-30 ENCOUNTER — Other Ambulatory Visit (HOSPITAL_COMMUNITY): Payer: Self-pay | Admitting: *Deleted

## 2013-05-31 ENCOUNTER — Other Ambulatory Visit: Payer: Self-pay | Admitting: Internal Medicine

## 2013-06-02 ENCOUNTER — Encounter (HOSPITAL_COMMUNITY)
Admission: RE | Admit: 2013-06-02 | Discharge: 2013-06-02 | Disposition: A | Discharge status: Home / Self Care | Payer: Medicare Other | Source: Ambulatory Visit | Attending: Nephrology | Admitting: Nephrology

## 2013-06-02 LAB — POCT HEMOGLOBIN-HEMACUE: HEMOGLOBIN: 12.2 g/dL — AB (ref 13.0–17.0)

## 2013-06-02 MED ORDER — EPOETIN ALFA 10000 UNIT/ML IJ SOLN: 10000.0000 [IU] | INTRAMUSCULAR | Status: DC

## 2013-06-02 NOTE — Telephone Encounter (Signed)
Refill done.  

## 2013-06-03 ENCOUNTER — Ambulatory Visit (INDEPENDENT_AMBULATORY_CARE_PROVIDER_SITE_OTHER): Payer: Medicare Other | Admitting: Internal Medicine

## 2013-06-03 ENCOUNTER — Encounter: Payer: Self-pay | Admitting: Internal Medicine

## 2013-06-03 VITALS — BP 142/82 | HR 73 | Temp 97.7°F | Wt 147.0 lb

## 2013-06-03 DIAGNOSIS — I10 Essential (primary) hypertension: Secondary | ICD-10-CM | POA: Diagnosis not present

## 2013-06-03 DIAGNOSIS — R112 Nausea with vomiting, unspecified: Secondary | ICD-10-CM

## 2013-06-03 DIAGNOSIS — E119 Type 2 diabetes mellitus without complications: Secondary | ICD-10-CM

## 2013-06-03 NOTE — Assessment & Plan Note (Signed)
stable overall by history and exam, recent data reviewed with pt, and pt to continue medical treatment as before,  to f/u any worsening symptoms or concerns BP Readings from Last 3 Encounters:  06/03/13 142/82  06/02/13 164/72  05/26/13 139/55

## 2013-06-03 NOTE — Patient Instructions (Addendum)
Please continue all other medications as before, and refills have been done if requested. Please have the pharmacy call with any other refills you may need.  Please continue your efforts at being more active, low cholesterol diet, and weight control.  Please go to the LAB in the Basement (turn left off the elevator) for the tests to be done later this week  You will be contacted by phone if any changes need to be made immediately.  Otherwise, you will receive a letter about your results with an explanation, but please check with MyChart first.  You will be contacted regarding the referral for: Dr Deatra Ina  Your form for [employer] application will be filled out

## 2013-06-03 NOTE — Assessment & Plan Note (Signed)
stable overall by history and exam, recent data reviewed with pt, and pt to continue medical treatment as before,  to f/u any worsening symptoms or concerns Lab Results  Component Value Date   HGBA1C 6.2 01/30/2012    

## 2013-06-03 NOTE — Progress Notes (Signed)
Pre visit review using our clinic review tool, if applicable. No additional management support is needed unless otherwise documented below in the visit note. 

## 2013-06-03 NOTE — Assessment & Plan Note (Addendum)
Recurrent, etiology unclear, has hx of esoph stricture, cont current meds/tx, for GI referral, also for bmet f/u renal fxn

## 2013-06-03 NOTE — Progress Notes (Signed)
Subjective:    Patient ID: Nathan Blackwell, male    DOB: 1921-02-26, 80 y.o.   MRN: [id number]  HPI  Here to f/u, overall doing quite well, feels relatively good stamina today after recent hospn with IVF's, has tolerated diet at home without further n/v, abd pain, reflux, regurgitation though hx somewhat limited due to dementia; wife supportive and corroborates that he seems to be tolerating po solids, fluids and meds for now.  Has hx of esoph stricture, last egd 2011, with recurrent episodes of n/v more recently  Pt recommended for bmp to f/u renal fxn, as well as referral back to Dr Deatra Ina regarding need for further eval.   Pt denies fever, wt loss, night sweats, loss of appetite, or other constitutional symptoms   Pt denies polydipsia, polyuria. Pt denies chest pain, increased sob or doe, wheezing, orthopnea, PND, increased LE swelling, palpitations, dizziness or syncope.  No new complaints. Monving to independent living with wife soon. Past Medical History  Diagnosis Date  . Diabetes mellitus type II   . Hyperlipidemia   . Hypertension   . Osteoporosis   . Prostatic hypertrophy     benign  . OSA (obstructive sleep apnea)   . Rotator cuff syndrome     chronic  . Elevated PSA   . Hypothyroidism   . Nephrolithiasis   . Thyroid nodule   . GERD (gastroesophageal reflux disease)   . Colitis, Clostridium difficile     presumed 11/08  . Dementia   . Anemia     NOS  . Vitamin B 12 deficiency   . BRADYCARDIA 06/10/2009  . COLON CANCER, HX OF 09/20/2006  . HYPOTHYROIDISM 01/14/2007  . DIABETES MELLITUS, TYPE II 09/20/2006  . HYPERLIPIDEMIA 09/20/2006  . ANEMIA-NOS 06/23/2008  . DEMENTIA 05/14/2007  . DEPRESSION 02/27/2007  . SLEEP APNEA, OBSTRUCTIVE 01/14/2007  . HYPERTENSION 09/20/2006  . GERD 01/14/2007  . OSTEOPOROSIS 10/08/2006  . PERIPHERAL EDEMA 09/03/2007  . RENAL INSUFFICIENCY 04/01/2010  . B12 deficiency 05/25/2010  . CLOSTRIDIUM DIFFICILE COLITIS 01/30/2007  . THYROID NODULE  01/14/2007  . CONSTIPATION, RECURRENT 01/06/2010  . BENIGN PROSTATIC HYPERTROPHY 10/08/2006  . ARTHRITIS 02/23/2007  . BACK PAIN, THORACIC REGION 06/04/2008  . COLONIC POLYPS, HX OF 10/08/2006  . NEPHROLITHIASIS, HX OF 01/14/2007  . Cancer of colon dx'd 1998  . Cancer of vocal cord dx'd 1998  . 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 DVT of lower limb, acute 01/12/2011  . Sleep apnea     CPAP dependent   Past Surgical History  Procedure Laterality Date  . Cholecystectomy  1999  . Inguinal herniorrhapy  1984  . Appendectomy  1998  . Colon resection  1998  . Skin cancer extraction    . Cataract extraction, bilateral      reports that he has never smoked. He has never used smokeless tobacco. He reports that he does not drink alcohol or use illicit drugs. family history includes Diabetes in his mother; Stroke in his father. Allergies  Allergen Reactions  . Biaxin [Clarithromycin] Other (See Comments)    Unknown   . Other Other (See Comments)    Nephrologist has told patient not to eat many different foods (tomatoes, Green Foods, etc).   . Sulfa Antibiotics Other (See Comments)    Unknown    Current Outpatient Prescriptions on File Prior to Visit  Medication Sig Dispense Refill  . acetaminophen (TYLENOL) 500 MG tablet Take 1 tablet (500 mg total) by mouth every 6 (six) hours as  needed for pain.  30 tablet  0  . buPROPion (WELLBUTRIN XL) 150 MG 24 hr tablet Take 150 mg by mouth daily.      . cholecalciferol (VITAMIN D) 1000 UNITS tablet Take 1,000 Units by mouth daily.      . citalopram (CELEXA) 20 MG tablet Take 20 mg by mouth daily.      Marland Kitchen docusate sodium (COLACE) 100 MG capsule Take 100 mg by mouth at bedtime.        . fenofibrate 160 MG tablet TAKE 1 TABLET BY MOUTH ONCE A DAY  30 tablet  0  . furosemide (LASIX) 20 MG tablet Take 20-40 mg by mouth 2 (two) times daily. 40mg  in the morning; 20mg  in the afternoon      . hydrALAZINE (APRESOLINE) 25 MG tablet Take 12.5 mg by mouth 3  (three) times daily.      . lansoprazole (PREVACID) 30 MG capsule Take 30 mg by mouth daily.      Marland Kitchen levothyroxine (SYNTHROID, LEVOTHROID) 100 MCG tablet Take 100 mcg by mouth daily before breakfast.      . lipase/protease/amylase (CREON-12/PANCREASE) 12000 UNITS CPEP Take 1 capsule by mouth 3 (three) times daily with meals.       Marland Kitchen OLANZapine (ZYPREXA) 5 MG tablet Take 5 mg by mouth daily.      . polyethylene glycol (MIRALAX / GLYCOLAX) packet Take 17 g by mouth 2 (two) times daily.  14 each  0  . sitaGLIPtin (JANUVIA) 50 MG tablet Take 50 mg by mouth daily.      . tamsulosin (FLOMAX) 0.4 MG CAPS capsule Take 0.4 mg by mouth at bedtime.       No current facility-administered medications on file prior to visit.   Review of Systems  Constitutional: Negative for unexpected weight change, or unusual diaphoresis  HENT: Negative for tinnitus.   Eyes: Negative for photophobia and visual disturbance.  Respiratory: Negative for choking and stridor.   Gastrointestinal: Negative for vomiting and blood in stool.  Genitourinary: Negative for hematuria and decreased urine volume.  Musculoskeletal: Negative for acute joint swelling Skin: Negative for color change and wound.  Neurological: Negative for tremors and numbness other than noted  Psychiatric/Behavioral: Negative for decreased concentration or  hyperactivity.       Objective:   Physical Exam BP 142/82  Pulse 73  Temp(Src) 97.7 F (36.5 C) (Oral)  Wt 147 lb (66.679 kg)  SpO2 96% VS noted,  Constitutional: Pt appears well-developed and well-nourished.  HENT: Head: NCAT.  Right Ear: External ear normal.  Left Ear: External ear normal.  Eyes: Conjunctivae and EOM are normal. Pupils are equal, round, and reactive to light.  Neck: Normal range of motion. Neck supple.  Cardiovascular: Normal rate and regular rhythm.   Pulmonary/Chest: Effort normal and breath sounds normal.  Abd:  Soft, NT, non-distended, + BS Neurological: Pt is alert.  Not confused  Skin: Skin is warm. No erythema.  Psychiatric: Pt behavior is normal. Thought content normal.     Assessment & Plan:

## 2013-06-04 ENCOUNTER — Other Ambulatory Visit (INDEPENDENT_AMBULATORY_CARE_PROVIDER_SITE_OTHER): Payer: Medicare Other

## 2013-06-04 DIAGNOSIS — R112 Nausea with vomiting, unspecified: Secondary | ICD-10-CM | POA: Diagnosis not present

## 2013-06-04 LAB — BASIC METABOLIC PANEL
BUN: 33 mg/dL — ABNORMAL HIGH (ref 6–23)
CALCIUM: 9.4 mg/dL (ref 8.4–10.5)
CO2: 29 mEq/L (ref 19–32)
Chloride: 100 mEq/L (ref 96–112)
Creatinine, Ser: 2.5 mg/dL — ABNORMAL HIGH (ref 0.4–1.5)
GFR: 26.24 mL/min — ABNORMAL LOW (ref >60.00)
GLUCOSE: 141 mg/dL — AB (ref 70–99)
Potassium: 3.5 mEq/L (ref 3.5–5.1)
SODIUM: 140 meq/L (ref 135–145)

## 2013-06-07 ENCOUNTER — Other Ambulatory Visit: Payer: Self-pay | Admitting: Internal Medicine

## 2013-06-12 DIAGNOSIS — H02839 Dermatochalasis of unspecified eye, unspecified eyelid: Secondary | ICD-10-CM | POA: Diagnosis not present

## 2013-06-12 DIAGNOSIS — H01029 Squamous blepharitis unspecified eye, unspecified eyelid: Secondary | ICD-10-CM | POA: Diagnosis not present

## 2013-06-13 DIAGNOSIS — Z0279 Encounter for issue of other medical certificate: Secondary | ICD-10-CM

## 2013-06-16 ENCOUNTER — Encounter (HOSPITAL_COMMUNITY)
Admission: RE | Admit: 2013-06-16 | Discharge: 2013-06-16 | Disposition: A | Discharge status: Home / Self Care | Payer: Medicare Other | Source: Ambulatory Visit | Attending: Nephrology | Admitting: Nephrology

## 2013-06-16 DIAGNOSIS — I129 Hypertensive chronic kidney disease with stage 1 through stage 4 chronic kidney disease, or unspecified chronic kidney disease: Secondary | ICD-10-CM | POA: Insufficient documentation

## 2013-06-16 DIAGNOSIS — N184 Chronic kidney disease, stage 4 (severe): Secondary | ICD-10-CM | POA: Diagnosis not present

## 2013-06-16 DIAGNOSIS — D649 Anemia, unspecified: Secondary | ICD-10-CM | POA: Insufficient documentation

## 2013-06-16 LAB — POCT HEMOGLOBIN-HEMACUE: Hemoglobin: 11 g/dL — ABNORMAL LOW (ref 13.0–17.0)

## 2013-06-16 MED ORDER — EPOETIN ALFA 10000 UNIT/ML IJ SOLN
10000.0000 [IU] | INTRAMUSCULAR | Status: DC
  Administered 2013-06-16: 10000 [IU] via SUBCUTANEOUS

## 2013-06-16 MED ORDER — EPOETIN ALFA 10000 UNIT/ML IJ SOLN
INTRAMUSCULAR | Status: AC
  Administered 2013-06-16: 10000 [IU] via SUBCUTANEOUS
  Filled 2013-06-16: qty 1

## 2013-07-03 ENCOUNTER — Ambulatory Visit: Payer: Medicare Other | Admitting: Neurology

## 2013-07-05 ENCOUNTER — Other Ambulatory Visit: Payer: Self-pay | Admitting: Internal Medicine

## 2013-07-05 ENCOUNTER — Other Ambulatory Visit: Payer: Self-pay | Admitting: Gastroenterology

## 2013-07-07 ENCOUNTER — Encounter (HOSPITAL_COMMUNITY)
Admission: RE | Admit: 2013-07-07 | Discharge: 2013-07-07 | Disposition: A | Discharge status: Home / Self Care | Payer: Medicare Other | Source: Ambulatory Visit | Attending: Nephrology | Admitting: Nephrology

## 2013-07-07 DIAGNOSIS — D649 Anemia, unspecified: Secondary | ICD-10-CM | POA: Insufficient documentation

## 2013-07-07 DIAGNOSIS — I129 Hypertensive chronic kidney disease with stage 1 through stage 4 chronic kidney disease, or unspecified chronic kidney disease: Secondary | ICD-10-CM | POA: Insufficient documentation

## 2013-07-07 DIAGNOSIS — N184 Chronic kidney disease, stage 4 (severe): Secondary | ICD-10-CM | POA: Insufficient documentation

## 2013-07-07 LAB — FERRITIN: Ferritin: 180 ng/mL (ref 22–322)

## 2013-07-07 LAB — IRON AND TIBC
IRON: 103 ug/dL (ref 42–135)
Saturation Ratios: 28 % (ref 20–55)
TIBC: 372 ug/dL (ref 215–435)
UIBC: 269 ug/dL (ref 125–400)

## 2013-07-07 LAB — POCT HEMOGLOBIN-HEMACUE: HEMOGLOBIN: 11.1 g/dL — AB (ref 13.0–17.0)

## 2013-07-07 MED ORDER — EPOETIN ALFA 10000 UNIT/ML IJ SOLN
10000.0000 [IU] | INTRAMUSCULAR | Status: DC
  Administered 2013-07-07: 10000 [IU] via SUBCUTANEOUS

## 2013-07-07 MED ORDER — EPOETIN ALFA 10000 UNIT/ML IJ SOLN
INTRAMUSCULAR | Status: AC
  Filled 2013-07-07: qty 1

## 2013-07-08 DIAGNOSIS — E119 Type 2 diabetes mellitus without complications: Secondary | ICD-10-CM | POA: Diagnosis not present

## 2013-07-08 DIAGNOSIS — L608 Other nail disorders: Secondary | ICD-10-CM | POA: Diagnosis not present

## 2013-07-12 ENCOUNTER — Other Ambulatory Visit: Payer: Self-pay | Admitting: Internal Medicine

## 2013-07-21 ENCOUNTER — Ambulatory Visit (INDEPENDENT_AMBULATORY_CARE_PROVIDER_SITE_OTHER): Payer: Medicare Other | Admitting: Neurology

## 2013-07-21 ENCOUNTER — Encounter: Payer: Self-pay | Admitting: Neurology

## 2013-07-21 ENCOUNTER — Telehealth: Payer: Self-pay | Admitting: Neurology

## 2013-07-21 VITALS — BP 130/70 | HR 83 | Ht 68.5 in | Wt 148.4 lb

## 2013-07-21 DIAGNOSIS — G20 Parkinson's disease: Secondary | ICD-10-CM

## 2013-07-21 DIAGNOSIS — G20C Parkinsonism, unspecified: Secondary | ICD-10-CM

## 2013-07-21 DIAGNOSIS — G20A1 Parkinson's disease without dyskinesia, without mention of fluctuations: Secondary | ICD-10-CM

## 2013-07-21 NOTE — Telephone Encounter (Signed)
Referral to [facility] PT faxed to [phone] with confirmation received. They will contact patient.

## 2013-07-21 NOTE — Progress Notes (Signed)
Nathan Blackwell was seen today in the movement disorders clinic for neurologic consultation at the request of [name], MD.  The consultation is for the evaluation of shuffling gait and to r/o PD.  This patient is accompanied in the office by his spouse who supplements the history.  The pt and wife report gait changes for 8 months x 1 year.  He has had several falls, but hasn't had a fall in months.  He recently attended therapy at the neurorehab center and that helped.  His coumadin was d/c after his last fall 2 months ago.   07/21/13 update:  The patient is seen in followup accompanied by his wife who supplements the history.  I have not seen the patient since October, 2014.  The patient has a history of gait instability, that is multifactorial.  This is likely secondary to both diabetic peripheral neuropathy as well as a parkinsonism secondary to Zyprexa.  He had an MRI of the brain since last visit that revealed atrophy and a very mild small vessel disease.  I have reviewed the records since the patient's last visit.  He was hospitalized in March, 2015 with nausea and vomiting.  Her workup was essentially unremarkable.  The patient remains on Zyprexa.  No falls but come close to it several times.  No PT in a long time.  Wife does the driving.  Getting ready to move to independent assisted living.     PREVIOUS MEDICATIONS: none to date  ALLERGIES:   Allergies  Allergen Reactions  . Biaxin [Clarithromycin] Other (See Comments)    Unknown   . Other Other (See Comments)    Nephrologist has told patient not to eat many different foods (tomatoes, Green Foods, etc).   . Sulfa Antibiotics Other (See Comments)    Unknown     CURRENT MEDICATIONS:  Current Outpatient Prescriptions on File Prior to Visit  Medication Sig Dispense Refill  . acetaminophen (TYLENOL) 500 MG tablet Take 1 tablet (500 mg total) by mouth every 6 (six) hours as needed for pain.  30 tablet  0  . buPROPion (WELLBUTRIN XL) 150  MG 24 hr tablet Take 150 mg by mouth daily.      . cholecalciferol (VITAMIN D) 1000 UNITS tablet Take 1,000 Units by mouth daily.      . citalopram (CELEXA) 20 MG tablet Take 20 mg by mouth daily.      Marland Kitchen CREON 12000 UNITS CPEP capsule TAKE 1 TO 2 CAPSULES BY MOUTH THREE TIMES A DAY WITH EACH MEAL  180 each  3  . docusate sodium (COLACE) 100 MG capsule Take 100 mg by mouth at bedtime.        . fenofibrate 160 MG tablet TAKE 1 TABLET BY MOUTH ONCE A DAY  30 tablet  11  . furosemide (LASIX) 20 MG tablet Take 20-40 mg by mouth 2 (two) times daily. 40mg  in the morning; 20mg  in the afternoon      . hydrALAZINE (APRESOLINE) 25 MG tablet Take 12.5 mg by mouth 3 (three) times daily.      . lansoprazole (PREVACID) 30 MG capsule TAKE 1 CAPSULE BY MOUTH ONCE A DAY  30 capsule  11  . levothyroxine (SYNTHROID, LEVOTHROID) 100 MCG tablet Take 100 mcg by mouth daily before breakfast.      . OLANZapine (ZYPREXA) 5 MG tablet TAKE 1 TABLET BY MOUTH ONCE A DAY  30 tablet  11  . polyethylene glycol (MIRALAX / GLYCOLAX) packet Take 17 g by  mouth 2 (two) times daily.  14 each  0  . sitaGLIPtin (JANUVIA) 50 MG tablet Take 50 mg by mouth daily.      . tamsulosin (FLOMAX) 0.4 MG CAPS capsule Take 0.4 mg by mouth at bedtime.       No current facility-administered medications on file prior to visit.    PAST MEDICAL HISTORY:   Past Medical History  Diagnosis Date  . Diabetes mellitus type II   . Hyperlipidemia   . Hypertension   . Osteoporosis   . Prostatic hypertrophy     benign  . OSA (obstructive sleep apnea)   . Rotator cuff syndrome     chronic  . Elevated PSA   . Hypothyroidism   . Nephrolithiasis   . Thyroid nodule   . GERD (gastroesophageal reflux disease)   . Colitis, Clostridium difficile     presumed 11/08  . Dementia   . Anemia     NOS  . Vitamin B 12 deficiency   . BRADYCARDIA 06/10/2009  . COLON CANCER, HX OF 09/20/2006  . HYPOTHYROIDISM 01/14/2007  . DIABETES MELLITUS, TYPE II 09/20/2006   . HYPERLIPIDEMIA 09/20/2006  . ANEMIA-NOS 06/23/2008  . DEMENTIA 05/14/2007  . DEPRESSION 02/27/2007  . SLEEP APNEA, OBSTRUCTIVE 01/14/2007  . HYPERTENSION 09/20/2006  . GERD 01/14/2007  . OSTEOPOROSIS 10/08/2006  . PERIPHERAL EDEMA 09/03/2007  . RENAL INSUFFICIENCY 04/01/2010  . B12 deficiency 05/25/2010  . CLOSTRIDIUM DIFFICILE COLITIS 01/30/2007  . THYROID NODULE 01/14/2007  . CONSTIPATION, RECURRENT 01/06/2010  . BENIGN PROSTATIC HYPERTROPHY 10/08/2006  . ARTHRITIS 02/23/2007  . BACK PAIN, THORACIC REGION 06/04/2008  . COLONIC POLYPS, HX OF 10/08/2006  . NEPHROLITHIASIS, HX OF 01/14/2007  . Cancer of colon dx'd 1998  . Cancer of vocal cord dx'd 1998  . 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 DVT of lower limb, acute 01/12/2011  . Sleep apnea     CPAP dependent    PAST SURGICAL HISTORY:   Past Surgical History  Procedure Laterality Date  . Cholecystectomy  1999  . Inguinal herniorrhapy  1984  . Appendectomy  1998  . Colon resection  1998  . Skin cancer extraction    . Cataract extraction, bilateral      SOCIAL HISTORY:   History   Social History  . Marital Status: Married    Spouse Name: N/A    Number of Children: 2  . Years of Education: N/A   Occupational History  . retired     Therapist, sports  .      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   Social History Main Topics  . Smoking status: Never Smoker   . Smokeless tobacco: Never Used  . Alcohol Use: No  . Drug Use: No  . Sexual Activity: Not on file   Other Topics Concern  . Not on file   Social History Narrative   Family history of Alcoholism/Addiction    FAMILY HISTORY:   Family Status  Relation Status Death Age  . Father Deceased 74    Stroke  . Mother Deceased 37    Diabetes  . Brother Alive     3  . Sister Alive   . Sister Deceased   . Son Alive   . Daughter Alive     Kidney Stones    ROS:  A complete 10 system review of systems was  obtained and was unremarkable apart from what is mentioned above.  PHYSICAL EXAMINATION:    VITALS:   Filed Vitals:   07/21/13 0909  BP: 130/70  Pulse: 83  Height: 5' 8.5" (1.74 m)  Weight: 148 lb 7 oz (67.331 kg)  SpO2: 96%    GEN:  The patient appears stated age and is in NAD. HEENT:  Normocephalic, atraumatic.  The mucous membranes are moist. The superficial temporal arteries are without ropiness or tenderness. CV:  RRR Lungs:  CTAB Neck/HEME:  There are no carotid bruits bilaterally.  Neurological examination:  Orientation: The patient scored 24/30 on his MMSE.  Even the ones missed were close; he reported the season as summer and the date as the 19th and it is the 18th.   Cranial nerves: There is good facial symmetry.The visual fields are full to confrontational testing. The speech is fluent and clear. Soft palate rises symmetrically and there is no tongue deviation. Hearing is intact to conversational tone. Sensation: Sensation is intact to light touch throughout. Motor: Strength is 5/5 throughout.  Movement examination: Tone: There is normal tone throughout. Abnormal movements: A resting tremor of the left upper extremity can be felt, but cannot particularly be seen. Coordination:  There is no significant decremation with RAM's Gait and Station: The patient has very minimal difficulty arising out of a deep-seated chair without the use of the hands. The patient only shuffles when he approximates the chair.  He does slightly drag the L leg with ambulation.  ASSESSMENT/PLAN:  1.  gait instability.  -I. think that this is multifactorial and much of this is due to diabetic peripheral neuropathy.  We talked about safety. They are getting ready to move to assisted living.  -I do think that the patient has a mild component of parkinsonism, which I suspect is secondary to the Zyprexa.  The patient and I discussed this today.  Perhaps a better choice of atypical antipsychotic agents  would be Seroquel, as this has less D2 receptor antagonism.  I am going to leave this to the expertise of Dr. Jenny Reichmann and the pt is to f/u with him.  He does not look worse than last visit in this regard, however.  -I will send care south to him for PT  -f/u prn.

## 2013-07-23 ENCOUNTER — Telehealth: Payer: Self-pay | Admitting: Internal Medicine

## 2013-07-23 DIAGNOSIS — F0390 Unspecified dementia without behavioral disturbance: Secondary | ICD-10-CM | POA: Diagnosis not present

## 2013-07-23 DIAGNOSIS — F329 Major depressive disorder, single episode, unspecified: Secondary | ICD-10-CM | POA: Diagnosis not present

## 2013-07-23 DIAGNOSIS — F3289 Other specified depressive episodes: Secondary | ICD-10-CM | POA: Diagnosis not present

## 2013-07-23 DIAGNOSIS — D649 Anemia, unspecified: Secondary | ICD-10-CM | POA: Diagnosis not present

## 2013-07-23 DIAGNOSIS — IMO0001 Reserved for inherently not codable concepts without codable children: Secondary | ICD-10-CM | POA: Diagnosis not present

## 2013-07-23 DIAGNOSIS — I129 Hypertensive chronic kidney disease with stage 1 through stage 4 chronic kidney disease, or unspecified chronic kidney disease: Secondary | ICD-10-CM | POA: Diagnosis not present

## 2013-07-23 DIAGNOSIS — E119 Type 2 diabetes mellitus without complications: Secondary | ICD-10-CM | POA: Diagnosis not present

## 2013-07-23 DIAGNOSIS — G20 Parkinson's disease: Secondary | ICD-10-CM | POA: Diagnosis not present

## 2013-07-23 DIAGNOSIS — N183 Chronic kidney disease, stage 3 unspecified: Secondary | ICD-10-CM | POA: Diagnosis not present

## 2013-07-23 MED ORDER — QUETIAPINE FUMARATE 50 MG PO TABS: 50.0000 mg | ORAL_TABLET | Freq: Every day | ORAL | Status: DC

## 2013-07-23 NOTE — Telephone Encounter (Signed)
As per Thayer Headings call today, I reviewed Dr Tat/neurology note  Ok to stop zyprexa  To start seroquel asd - done erx

## 2013-07-24 NOTE — Telephone Encounter (Signed)
Patient's wife informed

## 2013-07-25 ENCOUNTER — Encounter: Payer: Self-pay | Admitting: Gastroenterology

## 2013-07-25 ENCOUNTER — Ambulatory Visit (INDEPENDENT_AMBULATORY_CARE_PROVIDER_SITE_OTHER): Payer: Medicare Other | Admitting: Gastroenterology

## 2013-07-25 VITALS — BP 142/72 | HR 60 | Ht 68.0 in | Wt 147.0 lb

## 2013-07-25 DIAGNOSIS — R634 Abnormal weight loss: Secondary | ICD-10-CM

## 2013-07-25 DIAGNOSIS — R112 Nausea with vomiting, unspecified: Secondary | ICD-10-CM | POA: Diagnosis not present

## 2013-07-25 NOTE — Assessment & Plan Note (Signed)
Patient suffers from intermittent protracted episodes of nausea and vomiting.  He is a known diabetic.  Gastroparesis should be ruled out.  Doubt active peptic disease.  There has never been evidence for obstruction although this must be considered.  Recommendations #1 gastric emptying scan

## 2013-07-25 NOTE — Patient Instructions (Addendum)
You have been scheduled for a gastric emptying scan at [facility] on 08/13/13 at 930 am. Please arrive at least 15 minutes prior to your appointment for registration. Please make certain not to have anything to eat or drink after midnight the night before your test. Hold all stomach medications (ex: Zofran, phenergan, Reglan) 48 hours prior to your test. If you need to reschedule your appointment, please contact radiology scheduling at [phone]. _____________________________________________________________________ A gastric-emptying study measures how long it takes for food to move through your stomach. There are several ways to measure stomach emptying. In the most common test, you eat food that contains a small amount of radioactive material. A scanner that detects the movement of the radioactive material is placed over your abdomen to monitor the rate at which food leaves your stomach. This test normally takes about 2 hours to complete. _____________________________________________________________________ CC:  [name] MD

## 2013-07-25 NOTE — Progress Notes (Signed)
          History of Present Illness:  The patient has returned for evaluation of nausea and vomiting.  Every 3 months or so he has episodes of protracted nausea and vomiting.  This may last several hours.  He has been seen in the ED and was once hospitalized in March, 2015.  CT scan was unremarkable. In between he is symptom-3.  Weight is now stable.    Review of Systems: Pertinent positive and negative review of systems were noted in the above HPI section. All other review of systems were otherwise negative.    Current Medications, Allergies, Past Medical History, Past Surgical History, Family History and Social History were reviewed in [place] record  Vital signs were reviewed in today's medical record. Physical Exam: General: Well developed , well nourished, no acute distress Skin: anicteric Head: Normocephalic and atraumatic Eyes:  sclerae anicteric, EOMI Ears: Normal auditory acuity Mouth: No deformity or lesions Lungs: Clear throughout to auscultation Heart: Regular rate and rhythm; no murmurs, rubs or bruits Abdomen: Soft, non tender and non distended. No masses, hepatosplenomegaly or hernias noted. Normal Bowel sounds.  There is no succussion splash Rectal:deferred Musculoskeletal: Symmetrical with no gross deformities  Pulses:  Normal pulses noted Extremities: No clubbing, cyanosis, edema or deformities noted Neurological: Alert oriented x 4, grossly nonfocal Psychological:  Alert and cooperative. Normal mood and affect  See Assessment and Plan under Problem List

## 2013-07-25 NOTE — Assessment & Plan Note (Signed)
Weight has stabilized over the past year

## 2013-07-26 ENCOUNTER — Other Ambulatory Visit: Payer: Self-pay | Admitting: Internal Medicine

## 2013-07-29 ENCOUNTER — Encounter (HOSPITAL_COMMUNITY)
Admission: RE | Admit: 2013-07-29 | Discharge: 2013-07-29 | Disposition: A | Discharge status: Home / Self Care | Payer: Medicare Other | Source: Ambulatory Visit | Attending: Nephrology | Admitting: Nephrology

## 2013-07-29 LAB — POCT HEMOGLOBIN-HEMACUE: Hemoglobin: 12 g/dL — ABNORMAL LOW (ref 13.0–17.0)

## 2013-07-29 MED ORDER — EPOETIN ALFA 10000 UNIT/ML IJ SOLN: 10000.0000 [IU] | INTRAMUSCULAR | Status: DC

## 2013-07-30 DIAGNOSIS — F0390 Unspecified dementia without behavioral disturbance: Secondary | ICD-10-CM | POA: Diagnosis not present

## 2013-07-30 DIAGNOSIS — G20 Parkinson's disease: Secondary | ICD-10-CM | POA: Diagnosis not present

## 2013-07-30 DIAGNOSIS — F3289 Other specified depressive episodes: Secondary | ICD-10-CM | POA: Diagnosis not present

## 2013-07-30 DIAGNOSIS — IMO0001 Reserved for inherently not codable concepts without codable children: Secondary | ICD-10-CM | POA: Diagnosis not present

## 2013-07-30 DIAGNOSIS — E119 Type 2 diabetes mellitus without complications: Secondary | ICD-10-CM | POA: Diagnosis not present

## 2013-07-30 DIAGNOSIS — F329 Major depressive disorder, single episode, unspecified: Secondary | ICD-10-CM | POA: Diagnosis not present

## 2013-07-30 DIAGNOSIS — I129 Hypertensive chronic kidney disease with stage 1 through stage 4 chronic kidney disease, or unspecified chronic kidney disease: Secondary | ICD-10-CM | POA: Diagnosis not present

## 2013-08-04 ENCOUNTER — Encounter: Payer: Self-pay | Admitting: Internal Medicine

## 2013-08-04 ENCOUNTER — Ambulatory Visit (INDEPENDENT_AMBULATORY_CARE_PROVIDER_SITE_OTHER): Payer: Medicare Other | Admitting: Internal Medicine

## 2013-08-04 VITALS — BP 162/76 | HR 72 | Temp 98.0°F | Resp 16 | Wt 140.0 lb

## 2013-08-04 DIAGNOSIS — R634 Abnormal weight loss: Secondary | ICD-10-CM | POA: Diagnosis not present

## 2013-08-04 DIAGNOSIS — R10816 Epigastric abdominal tenderness: Secondary | ICD-10-CM

## 2013-08-04 DIAGNOSIS — E119 Type 2 diabetes mellitus without complications: Secondary | ICD-10-CM

## 2013-08-04 DIAGNOSIS — K5900 Constipation, unspecified: Secondary | ICD-10-CM

## 2013-08-04 MED ORDER — LINACLOTIDE 290 MCG PO CAPS: 290.0000 ug | ORAL_CAPSULE | Freq: Every day | ORAL | Status: DC | PRN

## 2013-08-04 NOTE — Assessment & Plan Note (Signed)
Continue with current prescription therapy as reflected on the Med list.  

## 2013-08-04 NOTE — Progress Notes (Signed)
Pre visit review using our clinic review tool, if applicable. No additional management support is needed unless otherwise documented below in the visit note. 

## 2013-08-04 NOTE — Assessment & Plan Note (Signed)
?  etiology 5/15 Hold Fenofibrate, Wellbutrin Dr Hermelinda Dellen CT ok [phone]

## 2013-08-04 NOTE — Assessment & Plan Note (Addendum)
?  etiology 5/15 Hold Fenofibrate, Wellbutrin Dr Hermelinda Dellen CT ok 2015 Treat constipation

## 2013-08-04 NOTE — Progress Notes (Signed)
   Subjective:    Patient ID: Nathan Blackwell, male    DOB: 04-28-1920, 80 y.o.   MRN: [id number]  Abdominal Pain This is a chronic problem. The current episode started more than 1 month ago. The onset quality is undetermined. The problem occurs 2 to 4 times per day. The problem has been waxing and waning. The pain is located in the generalized abdominal region. The pain is at a severity of 3/10. The pain is mild. The quality of the pain is aching, cramping and dull. The abdominal pain does not radiate. Associated symptoms include anorexia, belching, constipation and nausea. Pertinent negatives include no diarrhea, dysuria, fever, flatus, headaches, hematochezia, melena or vomiting. The pain is aggravated by eating. The pain is relieved by bowel movements. He has tried antacids (enzymes) for the symptoms. The treatment provided mild relief. Prior diagnostic workup includes CT scan and GI consult. There is no history of gallstones.   Wt Readings from Last 3 Encounters:  08/04/13 140 lb (63.504 kg)  07/25/13 147 lb (66.679 kg)  07/21/13 148 lb 7 oz (67.331 kg)      Review of Systems  Constitutional: Negative for fever and chills.  Gastrointestinal: Positive for nausea, abdominal pain, constipation and anorexia. Negative for vomiting, diarrhea, melena, hematochezia and flatus.  Genitourinary: Negative for dysuria.  Neurological: Negative for headaches.       Objective:   Physical Exam  Constitutional: He is oriented to person, place, and time. He appears well-developed. No distress.  NAD  HENT:  Mouth/Throat: Oropharynx is clear and moist.  Eyes: Conjunctivae are normal. Pupils are equal, round, and reactive to light.  Neck: Normal range of motion. No JVD present. No thyromegaly present.  Cardiovascular: Normal rate, regular rhythm, normal heart sounds and intact distal pulses.  Exam reveals no gallop and no friction rub.   No murmur heard. Pulmonary/Chest: Effort normal and breath sounds  normal. No respiratory distress. He has no wheezes. He has no rales. He exhibits no tenderness.  Abdominal: Soft. Bowel sounds are normal. He exhibits no distension and no mass. There is tenderness (LLQ and RLQ are sensitive). There is no rebound and no guarding.  Musculoskeletal: Normal range of motion. He exhibits no edema and no tenderness.  Lymphadenopathy:    He has no cervical adenopathy.  Neurological: He is alert and oriented to person, place, and time. He has normal reflexes. No cranial nerve deficit. He exhibits normal muscle tone. He displays a negative Romberg sign. Coordination abnormal. Gait normal.  No meningeal signs  Skin: Skin is warm and dry. No rash noted.  Psychiatric: He has a normal mood and affect. His behavior is normal. Thought content normal.    Lab Results  Component Value Date   WBC 7.8 05/25/2013   HGB 12.0* 07/29/2013   HCT 34.9* 05/25/2013   PLT 275 05/25/2013   GLUCOSE 141* 06/04/2013   CHOL 173 01/30/2012   TRIG 289.0* 01/30/2012   HDL 36.50* 01/30/2012   LDLDIRECT 107.8 01/30/2012   LDLCALC 92 09/21/2011   ALT 19 05/24/2013   AST 34 05/24/2013   NA 140 06/04/2013   K 3.5 06/04/2013   CL 100 06/04/2013   CREATININE 2.5* 06/04/2013   BUN 33* 06/04/2013   CO2 29 06/04/2013   TSH 0.46 07/09/2012   PSA 4.88* 01/14/2007   INR 0.97 09/04/2012   HGBA1C 6.2 01/30/2012   MICROALBUR 29.7* 05/18/2009    A complex case     Assessment & Plan:

## 2013-08-04 NOTE — Patient Instructions (Signed)
Stop Fenofibrate and Wellbutrin

## 2013-08-04 NOTE — Assessment & Plan Note (Signed)
Dr Hermelinda Dellen CT ok 2015 D/c fenofibrate Start Linzess

## 2013-08-05 DIAGNOSIS — L570 Actinic keratosis: Secondary | ICD-10-CM | POA: Diagnosis not present

## 2013-08-05 DIAGNOSIS — Z85828 Personal history of other malignant neoplasm of skin: Secondary | ICD-10-CM | POA: Diagnosis not present

## 2013-08-07 DIAGNOSIS — F329 Major depressive disorder, single episode, unspecified: Secondary | ICD-10-CM | POA: Diagnosis not present

## 2013-08-07 DIAGNOSIS — I129 Hypertensive chronic kidney disease with stage 1 through stage 4 chronic kidney disease, or unspecified chronic kidney disease: Secondary | ICD-10-CM | POA: Diagnosis not present

## 2013-08-07 DIAGNOSIS — F3289 Other specified depressive episodes: Secondary | ICD-10-CM | POA: Diagnosis not present

## 2013-08-07 DIAGNOSIS — F0390 Unspecified dementia without behavioral disturbance: Secondary | ICD-10-CM | POA: Diagnosis not present

## 2013-08-07 DIAGNOSIS — G20 Parkinson's disease: Secondary | ICD-10-CM | POA: Diagnosis not present

## 2013-08-07 DIAGNOSIS — IMO0001 Reserved for inherently not codable concepts without codable children: Secondary | ICD-10-CM | POA: Diagnosis not present

## 2013-08-07 DIAGNOSIS — E119 Type 2 diabetes mellitus without complications: Secondary | ICD-10-CM | POA: Diagnosis not present

## 2013-08-11 DIAGNOSIS — I739 Peripheral vascular disease, unspecified: Secondary | ICD-10-CM | POA: Diagnosis not present

## 2013-08-11 DIAGNOSIS — E119 Type 2 diabetes mellitus without complications: Secondary | ICD-10-CM | POA: Diagnosis not present

## 2013-08-12 ENCOUNTER — Encounter (HOSPITAL_COMMUNITY)
Admission: RE | Admit: 2013-08-12 | Discharge: 2013-08-12 | Disposition: A | Discharge status: Home / Self Care | Payer: Medicare Other | Source: Ambulatory Visit | Attending: Nephrology | Admitting: Nephrology

## 2013-08-12 DIAGNOSIS — D649 Anemia, unspecified: Secondary | ICD-10-CM | POA: Diagnosis not present

## 2013-08-12 DIAGNOSIS — N184 Chronic kidney disease, stage 4 (severe): Secondary | ICD-10-CM | POA: Diagnosis not present

## 2013-08-12 DIAGNOSIS — N2581 Secondary hyperparathyroidism of renal origin: Secondary | ICD-10-CM | POA: Diagnosis not present

## 2013-08-12 DIAGNOSIS — I129 Hypertensive chronic kidney disease with stage 1 through stage 4 chronic kidney disease, or unspecified chronic kidney disease: Secondary | ICD-10-CM | POA: Insufficient documentation

## 2013-08-12 LAB — POCT HEMOGLOBIN-HEMACUE: Hemoglobin: 12.6 g/dL — ABNORMAL LOW (ref 13.0–17.0)

## 2013-08-12 MED ORDER — EPOETIN ALFA 10000 UNIT/ML IJ SOLN: 10000.0000 [IU] | INTRAMUSCULAR | Status: DC

## 2013-08-13 ENCOUNTER — Ambulatory Visit (HOSPITAL_COMMUNITY)
Admission: RE | Admit: 2013-08-13 | Discharge: 2013-08-13 | Disposition: A | Discharge status: Home / Self Care | Payer: Medicare Other | Source: Ambulatory Visit | Attending: Gastroenterology | Admitting: Gastroenterology

## 2013-08-13 DIAGNOSIS — R109 Unspecified abdominal pain: Secondary | ICD-10-CM | POA: Diagnosis not present

## 2013-08-13 DIAGNOSIS — R112 Nausea with vomiting, unspecified: Secondary | ICD-10-CM | POA: Diagnosis not present

## 2013-08-13 MED ORDER — TECHNETIUM TC 99M SULFUR COLLOID
2.1000 | Freq: Once | INTRAVENOUS | Status: AC | PRN
  Administered 2013-08-13: 2.1 via ORAL

## 2013-08-13 NOTE — Progress Notes (Signed)
Quick Note:  Please inform the patient that GES was normal and to continue current plan of action ______ 

## 2013-08-14 DIAGNOSIS — G20 Parkinson's disease: Secondary | ICD-10-CM | POA: Diagnosis not present

## 2013-08-14 DIAGNOSIS — E119 Type 2 diabetes mellitus without complications: Secondary | ICD-10-CM | POA: Diagnosis not present

## 2013-08-14 DIAGNOSIS — F3289 Other specified depressive episodes: Secondary | ICD-10-CM | POA: Diagnosis not present

## 2013-08-14 DIAGNOSIS — I129 Hypertensive chronic kidney disease with stage 1 through stage 4 chronic kidney disease, or unspecified chronic kidney disease: Secondary | ICD-10-CM | POA: Diagnosis not present

## 2013-08-14 DIAGNOSIS — F0390 Unspecified dementia without behavioral disturbance: Secondary | ICD-10-CM | POA: Diagnosis not present

## 2013-08-14 DIAGNOSIS — IMO0001 Reserved for inherently not codable concepts without codable children: Secondary | ICD-10-CM | POA: Diagnosis not present

## 2013-08-14 DIAGNOSIS — F329 Major depressive disorder, single episode, unspecified: Secondary | ICD-10-CM | POA: Diagnosis not present

## 2013-08-15 DIAGNOSIS — F329 Major depressive disorder, single episode, unspecified: Secondary | ICD-10-CM | POA: Diagnosis not present

## 2013-08-15 DIAGNOSIS — IMO0001 Reserved for inherently not codable concepts without codable children: Secondary | ICD-10-CM | POA: Diagnosis not present

## 2013-08-15 DIAGNOSIS — E119 Type 2 diabetes mellitus without complications: Secondary | ICD-10-CM | POA: Diagnosis not present

## 2013-08-15 DIAGNOSIS — G20 Parkinson's disease: Secondary | ICD-10-CM | POA: Diagnosis not present

## 2013-08-15 DIAGNOSIS — F0390 Unspecified dementia without behavioral disturbance: Secondary | ICD-10-CM | POA: Diagnosis not present

## 2013-08-15 DIAGNOSIS — I129 Hypertensive chronic kidney disease with stage 1 through stage 4 chronic kidney disease, or unspecified chronic kidney disease: Secondary | ICD-10-CM | POA: Diagnosis not present

## 2013-08-15 DIAGNOSIS — F3289 Other specified depressive episodes: Secondary | ICD-10-CM | POA: Diagnosis not present

## 2013-08-19 ENCOUNTER — Other Ambulatory Visit: Payer: Self-pay | Admitting: Internal Medicine

## 2013-08-19 DIAGNOSIS — F3289 Other specified depressive episodes: Secondary | ICD-10-CM | POA: Diagnosis not present

## 2013-08-19 DIAGNOSIS — R079 Chest pain, unspecified: Secondary | ICD-10-CM

## 2013-08-19 DIAGNOSIS — G20 Parkinson's disease: Secondary | ICD-10-CM | POA: Diagnosis not present

## 2013-08-19 DIAGNOSIS — IMO0001 Reserved for inherently not codable concepts without codable children: Secondary | ICD-10-CM | POA: Diagnosis not present

## 2013-08-19 DIAGNOSIS — F0390 Unspecified dementia without behavioral disturbance: Secondary | ICD-10-CM | POA: Diagnosis not present

## 2013-08-19 DIAGNOSIS — I129 Hypertensive chronic kidney disease with stage 1 through stage 4 chronic kidney disease, or unspecified chronic kidney disease: Secondary | ICD-10-CM | POA: Diagnosis not present

## 2013-08-19 DIAGNOSIS — E119 Type 2 diabetes mellitus without complications: Secondary | ICD-10-CM | POA: Diagnosis not present

## 2013-08-19 DIAGNOSIS — F329 Major depressive disorder, single episode, unspecified: Secondary | ICD-10-CM | POA: Diagnosis not present

## 2013-08-20 ENCOUNTER — Ambulatory Visit (INDEPENDENT_AMBULATORY_CARE_PROVIDER_SITE_OTHER): Payer: Medicare Other | Admitting: Cardiology

## 2013-08-20 ENCOUNTER — Encounter: Payer: Self-pay | Admitting: Cardiology

## 2013-08-20 VITALS — BP 144/76 | HR 64 | Ht 68.5 in | Wt 141.9 lb

## 2013-08-20 DIAGNOSIS — N2581 Secondary hyperparathyroidism of renal origin: Secondary | ICD-10-CM | POA: Diagnosis not present

## 2013-08-20 DIAGNOSIS — I10 Essential (primary) hypertension: Secondary | ICD-10-CM | POA: Diagnosis not present

## 2013-08-20 DIAGNOSIS — I498 Other specified cardiac arrhythmias: Secondary | ICD-10-CM

## 2013-08-20 DIAGNOSIS — D631 Anemia in chronic kidney disease: Secondary | ICD-10-CM | POA: Diagnosis not present

## 2013-08-20 DIAGNOSIS — R079 Chest pain, unspecified: Secondary | ICD-10-CM

## 2013-08-20 DIAGNOSIS — N183 Chronic kidney disease, stage 3 unspecified: Secondary | ICD-10-CM

## 2013-08-20 DIAGNOSIS — N184 Chronic kidney disease, stage 4 (severe): Secondary | ICD-10-CM | POA: Diagnosis not present

## 2013-08-20 DIAGNOSIS — R42 Dizziness and giddiness: Secondary | ICD-10-CM | POA: Diagnosis not present

## 2013-08-20 DIAGNOSIS — N039 Chronic nephritic syndrome with unspecified morphologic changes: Secondary | ICD-10-CM | POA: Diagnosis not present

## 2013-08-20 DIAGNOSIS — E119 Type 2 diabetes mellitus without complications: Secondary | ICD-10-CM | POA: Diagnosis not present

## 2013-08-20 DIAGNOSIS — I951 Orthostatic hypotension: Secondary | ICD-10-CM

## 2013-08-20 MED ORDER — ISOSORBIDE MONONITRATE ER 30 MG PO TB24: 30.0000 mg | ORAL_TABLET | Freq: Every day | ORAL | Status: DC

## 2013-08-20 NOTE — Patient Instructions (Addendum)
Your physician wants you to follow-up in AFTER TEST.  You will receive a reminder letter in the mail two months in advance. If you don't receive a letter, please call our office to schedule the follow-up appointment.  Your physician has requested that you have a lexiscan myoview. For further information please visit [email or web address]. Please follow instruction sheet, as given.  START IMDUR 30 MG ONE TABLET DAILY.

## 2013-08-21 ENCOUNTER — Telehealth: Payer: Self-pay | Admitting: Cardiology

## 2013-08-21 DIAGNOSIS — I129 Hypertensive chronic kidney disease with stage 1 through stage 4 chronic kidney disease, or unspecified chronic kidney disease: Secondary | ICD-10-CM | POA: Diagnosis not present

## 2013-08-21 DIAGNOSIS — G20 Parkinson's disease: Secondary | ICD-10-CM | POA: Diagnosis not present

## 2013-08-21 DIAGNOSIS — IMO0001 Reserved for inherently not codable concepts without codable children: Secondary | ICD-10-CM | POA: Diagnosis not present

## 2013-08-21 DIAGNOSIS — F0390 Unspecified dementia without behavioral disturbance: Secondary | ICD-10-CM | POA: Diagnosis not present

## 2013-08-21 DIAGNOSIS — F329 Major depressive disorder, single episode, unspecified: Secondary | ICD-10-CM | POA: Diagnosis not present

## 2013-08-21 DIAGNOSIS — E119 Type 2 diabetes mellitus without complications: Secondary | ICD-10-CM | POA: Diagnosis not present

## 2013-08-21 DIAGNOSIS — F3289 Other specified depressive episodes: Secondary | ICD-10-CM | POA: Diagnosis not present

## 2013-08-21 NOTE — Telephone Encounter (Signed)
Left msg for pt to call.  Needs lexiscan.

## 2013-08-22 ENCOUNTER — Encounter: Payer: Self-pay | Admitting: Cardiology

## 2013-08-22 DIAGNOSIS — R079 Chest pain, unspecified: Secondary | ICD-10-CM | POA: Insufficient documentation

## 2013-08-22 NOTE — Progress Notes (Signed)
PATIENT: Nathan Blackwell MRN: [id number] DOB: 1920-09-20 PCP: [name], MD  Clinic Note: Chief Complaint  Patient presents with  . New Evaluation    CHEST PAIN WITH EXERTION ,ESPECIALLY AFTER WALKING TO MAILBOX WITH A THEAPIST, NO SOB , LITTLE ANKLE SWELLING, LAST VISIT WITH DR ROSS 2011/2012    HPI: Nathan Blackwell is a 80 y.o. male with a PMH below who presents today for evaluation of chest pain. He is a 80 year old gentleman cardiac risk factors of mild hypertension, diabetes on oral medications as well as simply being an elderly gentleman. He has a very unsteady shuffling gait and uses a walker. Her recently started using a cane to work with a therapist.   While doing so he has noted chest discomfort with exertion. It is service chest and usually relieved by rest.. He is a very poor historian, and is not note any significant symptoms of exertional dyspnea. But basically was walking to the mailbox he was noting chest discomfort. He does not noted with doing usually walking around the house using his walker. Walking into the clinic today with a walker he did not have chest discomfort which is about the same distance as he does walking to the mailbox.  This past Monday while walking he started noting getting dizzy and had a fall. This seemed to be partially due to his balance issues but also partly due to orthostatic hypotension.  There is no complaint of PND, orthopnea or edema. No rapid or irregular heartbeat/palpitations. He does have an unsteady gait and poor balance but no syncope/near syncope or TIA/amaurosis fugax symptoms. He sleeps with CPAP for OSA. He does have chronic lower extremity edema for which he takes Lasix. He usually keeps it at [facility]. With this he does has orthostatic dizziness. No melena, hematochezia or hematuria. No epistaxis. No claudication.  Past Medical History  Diagnosis Date  . OSA (obstructive sleep apnea)   . Hypothyroidism   . Nephrolithiasis   . Thyroid  nodule   . Vitamin B 12 deficiency   . BRADYCARDIA 06/10/2009  . COLON CANCER, HX OF 09/20/2006  . HYPOTHYROIDISM 01/14/2007  . DIABETES MELLITUS, TYPE II 09/20/2006  . HYPERLIPIDEMIA 09/20/2006  . ANEMIA-NOS 06/23/2008  . DEMENTIA 05/14/2007  . DEPRESSION 02/27/2007  . SLEEP APNEA, OBSTRUCTIVE 01/14/2007  . HYPERTENSION 09/20/2006  . GERD 01/14/2007  . OSTEOPOROSIS 10/08/2006  . PERIPHERAL EDEMA 09/03/2007  . RENAL INSUFFICIENCY 04/01/2010  . B12 deficiency 05/25/2010  . CLOSTRIDIUM DIFFICILE COLITIS 01/30/2007  . THYROID NODULE 01/14/2007  . CONSTIPATION, RECURRENT 01/06/2010  . BENIGN PROSTATIC HYPERTROPHY 10/08/2006  . ARTHRITIS 02/23/2007  . BACK PAIN, THORACIC REGION 06/04/2008  . COLONIC POLYPS, HX OF 10/08/2006  . NEPHROLITHIASIS, HX OF 01/14/2007  . Cancer of colon dx'd 1998  . Cancer of vocal cord dx'd 1998  . 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 DVT of lower limb, acute 01/12/2011  . Sleep apnea     CPAP dependent    Prior Cardiac Evaluation and Past Surgical History: Past Surgical History  Procedure Laterality Date  . Cholecystectomy  1999  . Inguinal herniorrhapy  1984  . Appendectomy  1998  . Colon resection  1998  . Skin cancer extraction    . Cataract extraction, bilateral      Allergies  Allergen Reactions  . Biaxin [Clarithromycin] Other (See Comments)    Unknown   . Other Other (See Comments)    Nephrologist has told patient not to eat many different foods (tomatoes, Green  Foods, etc).   . Sulfa Antibiotics Other (See Comments)    Unknown     Current Outpatient Prescriptions  Medication Sig Dispense Refill  . acetaminophen (TYLENOL) 500 MG tablet Take 1 tablet (500 mg total) by mouth every 6 (six) hours as needed for pain.  30 tablet  0  . cholecalciferol (VITAMIN D) 1000 UNITS tablet Take 1,000 Units by mouth daily.      . citalopram (CELEXA) 20 MG tablet Take 20 mg by mouth daily.      Marland Kitchen CREON 12000 UNITS CPEP capsule TAKE 1 TO 2 CAPSULES BY MOUTH THREE  TIMES A DAY WITH EACH MEAL  180 each  3  . docusate sodium (COLACE) 100 MG capsule Take 100 mg by mouth at bedtime.        . furosemide (LASIX) 20 MG tablet Take 20-40 mg by mouth 2 (two) times daily. 40mg  in the morning; 20mg  in the afternoon      . hydrALAZINE (APRESOLINE) 25 MG tablet Take 12.5 mg by mouth 3 (three) times daily.      Marland Kitchen JANUVIA 50 MG tablet TAKE 1 TABLET BY MOUTH DAILY  30 tablet  11  . lansoprazole (PREVACID) 30 MG capsule TAKE 1 CAPSULE BY MOUTH ONCE A DAY  30 capsule  11  . levothyroxine (SYNTHROID, LEVOTHROID) 100 MCG tablet Take 100 mcg by mouth daily before breakfast.      . Linaclotide (LINZESS) 290 MCG CAPS capsule Take 1 capsule (290 mcg total) by mouth daily as needed.  30 capsule  5  . OLANZapine (ZYPREXA) 5 MG tablet Take 5 mg by mouth daily.      . polyethylene glycol (MIRALAX / GLYCOLAX) packet Take 17 g by mouth 2 (two) times daily.  14 each  0  . QUEtiapine (SEROQUEL) 50 MG tablet Take 1 tablet (50 mg total) by mouth at bedtime.  90 tablet  3  . tamsulosin (FLOMAX) 0.4 MG CAPS capsule Take 0.4 mg by mouth at bedtime.      . isosorbide mononitrate (IMDUR) 30 MG 24 hr tablet Take 1 tablet (30 mg total) by mouth daily.  30 tablet  11   No current facility-administered medications for this visit.    History   Social History Narrative   He and his wife [name] are the process of moving to an independent living center - [facility]. This will allow them to have 3 meals a day and available activities they come along with being in a retirement community.   He never smoked. Does not take alcohol.   He is a retired Scientist, product/process development.   Family history of Alcoholism/Addiction.   family history includes Diabetes in his mother; Stroke in his father.  ROS: A comprehensive Review of Systems - Negative except Symptoms in history of present illness. Chronic pain from back and hips. Chronic renal insufficiency.  PHYSICAL EXAM BP 144/76  Pulse 64  Ht 5' 8.5" (1.74 m)  Wt  141 lb 14.4 oz (64.365 kg)  BMI 21.26 kg/m2 General appearance: alert, appears stated age, no distress and Thin but well-nourished. Very hard of hearing, uses a high frequency amplifier hearing aid; poor historian Neck: no adenopathy, no carotid bruit, no JVD and supple, symmetrical, trachea midline Lungs: clear to auscultation bilaterally, normal percussion bilaterally and Nonlabored, good air movement Heart: RRR, normal S1-S2, no M./R./G. Abdomen: soft, non-tender; bowel sounds normal; no masses,  no organomegaly Extremities: No clubbing or cyanosis with mild edema. Mild varicosities Pulses: 2+ and symmetric Skin:  Skin color, texture, turgor normal. No rashes or lesions or But thin Neurologic: Mental status: alertness: alert, orientation: person, place, city, affect: blunted Cranial nerves: normal with the exception of hearing   Adult ECG Report  Rate: 64 ;  Rhythm: normal sinus rhythm ; minimal voltage for LVH. Borderline left axis deviation. Otherwise relatively normal. Recent Labs: Hemoglobin 12.6  ASSESSMENT / PLAN: Pleasant, mildly demented elderly gentleman whose ability poor historian referred for exertional chest discomfort that seems to be only with walking with a cane. While this sounds consistent with exertional angina, it straight he doesn't have symptoms when walking with his rolling walker. We had a long talk about the issues. He is leaving the decision to me, however I made clear that each level evaluation needs to have an endpoint. We agreed that noninvasive evaluation is warranted for risk stratification. I am not aware of discussions his have his primary physician about CODE STATUS etc. Most likely course of action will be medical management unless high-risk stress test result.  Exertional chest pain Some typical and some atypical symptoms as described above. He certainly does have cardiac risk factors and as long as they're extended diagnostic testing, we would proceed with  LexiScan Myoview. He would not do well on treadmill. My discussion with him and his wife led [place] to the decision that we would only on a high risk result. We'll go ahead and start low-dose Imdur.  Dizziness Probably multifactorial, but certainly has some orthostatic problems associated with it. Hydralazine and not be the best option for him from this standpoint she blood pressure, however with bradycardia cannot use beta blockers or non-dihydropyridine calcium channel blockers. With renal insufficiency reluctant to use ACE inhibitors-ARBs.  Edema would be reluctant to use a dihydropyridine calcium channel blocker.  HYPERTENSION Stable. I think allowing for permissive hypertension is appropriate  BRADYCARDIA No beta blockers and calcium blockers  Orthostatic hypotension Permissive hypertension, and avoid excess use of Lasix  CKD (chronic kidney disease), stage III Sees nephrology. According ARB/ACE inhibitor  DIABETES MELLITUS, TYPE II On oral medications. But for long-term.     Orders Placed This Encounter  Procedures  . Myocardial Perfusion Imaging    Standing Status: Future     Number of Occurrences:      Standing Expiration Date: 08/20/2014    Order Specific Question:  Where should this test be performed    Answer:  [facility]    Order Specific Question:  Type of stress    Answer:  Lexiscan    Order Specific Question:  Patient weight in lbs    Answer:  141  . EKG 12-Lead   Meds ordered this encounter  Medications  . OLANZapine (ZYPREXA) 5 MG tablet    Sig: Take 5 mg by mouth daily.  . isosorbide mononitrate (IMDUR) 30 MG 24 hr tablet    Sig: Take 1 tablet (30 mg total) by mouth daily.    Dispense:  30 tablet    Refill:  11    Followup: 2 months  Tamme Mozingo W. Ellyn Hack, M.D., M.S. Interventional Cardiolgy [facility]

## 2013-08-22 NOTE — Assessment & Plan Note (Signed)
Sees nephrology. According ARB/ACE inhibitor

## 2013-08-22 NOTE — Assessment & Plan Note (Signed)
Probably multifactorial, but certainly has some orthostatic problems associated with it. Hydralazine and not be the best option for him from this standpoint she blood pressure, however with bradycardia cannot use beta blockers or non-dihydropyridine calcium channel blockers. With renal insufficiency reluctant to use ACE inhibitors-ARBs.  Edema would be reluctant to use a dihydropyridine calcium channel blocker.

## 2013-08-22 NOTE — Assessment & Plan Note (Signed)
No beta blockers and calcium blockers

## 2013-08-22 NOTE — Assessment & Plan Note (Signed)
On oral medications. But for long-term.

## 2013-08-22 NOTE — Assessment & Plan Note (Signed)
Stable. I think allowing for permissive hypertension is appropriate

## 2013-08-22 NOTE — Assessment & Plan Note (Signed)
Permissive hypertension, and avoid excess use of Lasix

## 2013-08-22 NOTE — Assessment & Plan Note (Addendum)
Some typical and some atypical symptoms as described above. He certainly does have cardiac risk factors and as long as they're extended diagnostic testing, we would proceed with LexiScan Myoview. He would not do well on treadmill. My discussion with him and his wife led [place] to the decision that we would only on a high risk result. We'll go ahead and start low-dose Imdur.

## 2013-08-25 ENCOUNTER — Ambulatory Visit (INDEPENDENT_AMBULATORY_CARE_PROVIDER_SITE_OTHER): Payer: Medicare Other | Admitting: Internal Medicine

## 2013-08-25 ENCOUNTER — Encounter: Payer: Self-pay | Admitting: Internal Medicine

## 2013-08-25 VITALS — BP 128/62 | HR 76 | Temp 98.3°F | Resp 16 | Wt 143.0 lb

## 2013-08-25 DIAGNOSIS — E538 Deficiency of other specified B group vitamins: Secondary | ICD-10-CM | POA: Diagnosis not present

## 2013-08-25 DIAGNOSIS — R05 Cough: Secondary | ICD-10-CM | POA: Diagnosis not present

## 2013-08-25 DIAGNOSIS — I10 Essential (primary) hypertension: Secondary | ICD-10-CM | POA: Diagnosis not present

## 2013-08-25 DIAGNOSIS — E119 Type 2 diabetes mellitus without complications: Secondary | ICD-10-CM | POA: Diagnosis not present

## 2013-08-25 DIAGNOSIS — R059 Cough, unspecified: Secondary | ICD-10-CM

## 2013-08-25 NOTE — Assessment & Plan Note (Signed)
Continue with current prescription therapy as reflected on the Med list.  

## 2013-08-25 NOTE — Progress Notes (Signed)
   Subjective:    Cough This is a recurrent problem. The problem has been gradually worsening. The cough is productive of sputum. Pertinent negatives include no chills, rhinorrhea, shortness of breath or wheezing. The symptoms are aggravated by lying down. The treatment provided mild relief. There is no history of asthma.   He had a bad coughing spell last night Can't swallow large pills Abd pain and wt loss is better off Wellbutrin and Fenofibrate  They are moving to a [place] from Last 3 Encounters:  08/25/13 143 lb (64.864 kg)  08/20/13 141 lb 14.4 oz (64.365 kg)  08/04/13 140 lb (63.504 kg)      Review of Systems  Constitutional: Negative for chills.  HENT: Negative for rhinorrhea.   Respiratory: Positive for cough. Negative for shortness of breath and wheezing.   Gastrointestinal: Negative for nausea, vomiting and constipation.  Endocrine: Negative for polydipsia and polyuria.       Objective:   Physical Exam  Constitutional: He is oriented to person, place, and time. He appears well-developed. No distress.  NAD  HENT:  Mouth/Throat: Oropharynx is clear and moist.  Eyes: Conjunctivae are normal. Pupils are equal, round, and reactive to light.  Neck: Normal range of motion. No JVD present. No thyromegaly present.  Cardiovascular: Normal rate, regular rhythm, normal heart sounds and intact distal pulses.  Exam reveals no gallop and no friction rub.   No murmur heard. Pulmonary/Chest: Effort normal and breath sounds normal. No respiratory distress. He has no wheezes. He has no rales. He exhibits no tenderness.  Abdominal: Soft. Bowel sounds are normal. He exhibits no distension and no mass. There is no tenderness. There is no rebound and no guarding.  Musculoskeletal: Normal range of motion. He exhibits no edema and no tenderness.  Lymphadenopathy:    He has no cervical adenopathy.  Neurological: He is alert and oriented to person,  place, and time. He has normal reflexes. No cranial nerve deficit. He exhibits normal muscle tone. He displays a negative Romberg sign. Coordination abnormal. Gait normal.  No meningeal signs  Skin: Skin is warm and dry. No rash noted.  Psychiatric: He has a normal mood and affect. His behavior is normal. Thought content normal.    Lab Results  Component Value Date   WBC 7.8 05/25/2013   HGB 12.6* 08/12/2013   HCT 34.9* 05/25/2013   PLT 275 05/25/2013   GLUCOSE 141* 06/04/2013   CHOL 173 01/30/2012   TRIG 289.0* 01/30/2012   HDL 36.50* 01/30/2012   LDLDIRECT 107.8 01/30/2012   LDLCALC 92 09/21/2011   ALT 19 05/24/2013   AST 34 05/24/2013   NA 140 06/04/2013   K 3.5 06/04/2013   CL 100 06/04/2013   CREATININE 2.5* 06/04/2013   BUN 33* 06/04/2013   CO2 29 06/04/2013   TSH 0.46 07/09/2012   PSA 4.88* 01/14/2007   INR 0.97 09/04/2012   HGBA1C 6.2 01/30/2012   MICROALBUR 29.7* 05/18/2009         Assessment & Plan:

## 2013-08-25 NOTE — Patient Instructions (Signed)
Likely a positional aspiration cough - a saliva aspiration related Use a foam wedge for bed to elevate head Tessalon - use as needed

## 2013-08-25 NOTE — Progress Notes (Signed)
Pre visit review using our clinic review tool, if applicable. No additional management support is needed unless otherwise documented below in the visit note. 

## 2013-08-25 NOTE — Assessment & Plan Note (Signed)
6/15 positional - supine; likely a saliva aspiration related Foam wedge for bed Tessalon prn

## 2013-08-26 ENCOUNTER — Encounter (HOSPITAL_COMMUNITY)
Admission: RE | Admit: 2013-08-26 | Discharge: 2013-08-26 | Disposition: A | Discharge status: Home / Self Care | Payer: Medicare Other | Source: Ambulatory Visit | Attending: Nephrology | Admitting: Nephrology

## 2013-08-26 ENCOUNTER — Telehealth: Payer: Self-pay | Admitting: Internal Medicine

## 2013-08-26 DIAGNOSIS — N184 Chronic kidney disease, stage 4 (severe): Secondary | ICD-10-CM | POA: Diagnosis not present

## 2013-08-26 LAB — IRON AND TIBC
Iron: 68 ug/dL (ref 42–135)
Saturation Ratios: 26 % (ref 20–55)
TIBC: 258 ug/dL (ref 215–435)
UIBC: 190 ug/dL (ref 125–400)

## 2013-08-26 LAB — POCT HEMOGLOBIN-HEMACUE: HEMOGLOBIN: 11.4 g/dL — AB (ref 13.0–17.0)

## 2013-08-26 LAB — FERRITIN: Ferritin: 237 ng/mL (ref 22–322)

## 2013-08-26 MED ORDER — EPOETIN ALFA 10000 UNIT/ML IJ SOLN
10000.0000 [IU] | INTRAMUSCULAR | Status: DC
  Administered 2013-08-26: 10000 [IU] via SUBCUTANEOUS

## 2013-08-26 MED ORDER — EPOETIN ALFA 10000 UNIT/ML IJ SOLN
INTRAMUSCULAR | Status: AC
  Filled 2013-08-26: qty 1

## 2013-08-26 NOTE — Telephone Encounter (Signed)
Relevant patient education assigned to patient using Emmi. ° °

## 2013-08-29 ENCOUNTER — Telehealth (HOSPITAL_COMMUNITY): Payer: Self-pay

## 2013-09-02 ENCOUNTER — Ambulatory Visit (HOSPITAL_COMMUNITY)
Admission: RE | Admit: 2013-09-02 | Discharge: 2013-09-02 | Disposition: A | Discharge status: Home / Self Care | Payer: Medicare Other | Source: Ambulatory Visit | Attending: Cardiovascular Disease | Admitting: Cardiovascular Disease

## 2013-09-02 DIAGNOSIS — R42 Dizziness and giddiness: Secondary | ICD-10-CM | POA: Insufficient documentation

## 2013-09-02 DIAGNOSIS — R079 Chest pain, unspecified: Secondary | ICD-10-CM | POA: Diagnosis not present

## 2013-09-02 DIAGNOSIS — R0609 Other forms of dyspnea: Secondary | ICD-10-CM | POA: Insufficient documentation

## 2013-09-02 DIAGNOSIS — R0989 Other specified symptoms and signs involving the circulatory and respiratory systems: Secondary | ICD-10-CM | POA: Insufficient documentation

## 2013-09-02 HISTORY — PX: OTHER SURGICAL HISTORY: SHX169

## 2013-09-02 MED ORDER — AMINOPHYLLINE 25 MG/ML IV SOLN
75.0000 mg | Freq: Once | INTRAVENOUS | Status: AC
  Administered 2013-09-02: 75 mg via INTRAVENOUS

## 2013-09-02 MED ORDER — REGADENOSON 0.4 MG/5ML IV SOLN
0.4000 mg | Freq: Once | INTRAVENOUS | Status: AC
  Administered 2013-09-02: 0.4 mg via INTRAVENOUS

## 2013-09-02 MED ORDER — TECHNETIUM TC 99M SESTAMIBI GENERIC - CARDIOLITE
10.0000 | Freq: Once | INTRAVENOUS | Status: AC | PRN
  Administered 2013-09-02: 10 via INTRAVENOUS

## 2013-09-02 MED ORDER — TECHNETIUM TC 99M SESTAMIBI GENERIC - CARDIOLITE
30.0000 | Freq: Once | INTRAVENOUS | Status: AC | PRN
  Administered 2013-09-02: 30 via INTRAVENOUS

## 2013-09-02 NOTE — Procedures (Addendum)
[facility] [address] [place] [address] [phone]  Cardiology Nuclear Med Study  ALEM FAHL is a 80 y.o. male     MRN : [id number]     DOB: 02/25/1921  Procedure Date: 09/02/2013  Nuclear Med Background Indication for Stress Test:  Evaluation for Ischemia History:  SB;No prior respiratory or cardiac history reported. No prior NUC MPI for comparison. Cardiac Risk Factors: Family History - CAD, Hypertension, Lipids, NIDDM and DVT;orthostatic hypotension  Symptoms:  Chest Pain, Dizziness and DOE   Nuclear Pre-Procedure Caffeine/Decaff Intake:  7:00pm NPO After: 5:00am   IV Site: R Forearm  IV 0.9% NS with Angio Cath:  22g  Chest Size (in):  38"  IV Started by: Rolene Course, RN  Height: 5' 8.5" (1.74 m)  Cup Size: n/a  BMI:  Body mass index is 21.12 kg/(m^2). Weight:  141 lb (63.957 kg)   Tech Comments:  n/a    Nuclear Med Study 1 or 2 day study: 1 day  Stress Test Type:  [facility] Provider:  Glenetta Hew, MD   Resting Radionuclide: Technetium 10m Sestamibi  Resting Radionuclide Dose: 10.4 mCi   Stress Radionuclide:  Technetium 11m Sestamibi  Stress Radionuclide Dose: 31.4 mCi           Stress Protocol Rest HR: 58 Stress HR: 78  Rest BP:167/85 Stress BP:183/81  Exercise Time (min): n/a METS: n/a          Dose of Adenosine (mg):  n/a Dose of Lexiscan: 0.4 mg  Dose of Atropine (mg): n/a Dose of Dobutamine: n/a mcg/kg/min (at max HR)  Stress Test Technologist: Mellody Memos, CCT Nuclear Technologist: Otho Perl, CNMT   Rest Procedure:  Myocardial perfusion imaging was performed at rest 45 minutes following the intravenous administration of Technetium 68m Sestamibi. Stress Procedure:  The patient received IV Lexiscan 0.4 mg over 15-seconds.  Technetium 64m Sestamibi injected IV at 30-seconds.  Patient experienced dizziness and was administered 75 mg of Aminophylline IV at 5  minutes. There were no significant changes with Lexiscan.  Quantitative spect images were obtained after a 45 minute delay.  Transient Ischemic Dilatation (Normal <1.22):  0.86 QGS EDV:  61 ml QGS ESV:  23 ml LV Ejection Fraction: 62%  Rest ECG: NSR - Normal EKG  Stress ECG: Rate-dependent LBBB developed after administration of lexiscan  QPS Raw Data Images:  Normal; no motion artifact; normal heart/lung ratio. Stress Images:  There is decreased uptake in the inferior wall. Rest Images:  There is decreased uptake in the inferior wall. Subtraction (SDS):  There is a fixed inferior defect that is most consistent with diaphragmatic attenuation.  Impression Exercise Capacity:  Lexiscan with no exercise. BP Response:  Hypertensive blood pressure response. Clinical Symptoms:  Dizziness ECG Impression:  Rate-dependent LBBB Comparison with Prior Nuclear Study: No previous nuclear study performed  Overall Impression:  Low risk stress nuclear study with fixed inferior defect. Rate-dependent LBBB developed after lexiscan was administered but resolved.  LV Wall Motion:  NL LV Function; NL Wall Motion' EF 62%.  Pixie Casino, MD, [facility] Board Certified in Nuclear Cardiology Attending Cardiologist [place], MD  09/02/2013 12:43 PM

## 2013-09-02 NOTE — Progress Notes (Signed)
Quick Note:  Stress Test looked OK !! No sign of active Heart Artery Disease -- there is an indication of old heart attack but no areas that are getting less blood flow with stress vs. rest.  Pump function is normal.  Good news!! No further evaluation needed. LOW RISK  Nathan Blackwell W, MD  ______

## 2013-09-03 ENCOUNTER — Telehealth: Payer: Self-pay | Admitting: Cardiology

## 2013-09-03 ENCOUNTER — Telehealth: Payer: Self-pay | Admitting: *Deleted

## 2013-09-03 NOTE — Telephone Encounter (Signed)
New Prob   Pts wife has some questions regarding future follow up appt. Please call.

## 2013-09-03 NOTE — Telephone Encounter (Signed)
Message copied by Raiford Simmonds on Wed Sep 03, 2013  9:55 AM ------      Message from: Leonie Man      Created: Tue Sep 02, 2013  6:52 PM       Stress Test looked OK !! No sign of active Heart Artery Disease -- there is an indication of old heart attack but no areas that are getting less blood flow with stress vs. rest.        Pump function is normal.            Good news!! No further evaluation needed.  LOW RISK            Leonie Man, MD       ------

## 2013-09-03 NOTE — Telephone Encounter (Signed)
Spoke to patient and wife. Result given . Verbalized understanding  

## 2013-09-03 NOTE — Telephone Encounter (Signed)
Pt. Called asking about an appt., deferred to [place] in scheduling

## 2013-09-04 ENCOUNTER — Telehealth: Payer: Self-pay | Admitting: Cardiology

## 2013-09-04 NOTE — Telephone Encounter (Signed)
Encounter complete. 

## 2013-09-04 NOTE — Telephone Encounter (Signed)
lmsg for pt to call and make follow up appt with dr. Ellyn Hack. - next available.

## 2013-09-09 DIAGNOSIS — E119 Type 2 diabetes mellitus without complications: Secondary | ICD-10-CM | POA: Diagnosis not present

## 2013-09-09 DIAGNOSIS — L608 Other nail disorders: Secondary | ICD-10-CM | POA: Diagnosis not present

## 2013-09-16 ENCOUNTER — Encounter (HOSPITAL_COMMUNITY)
Admission: RE | Admit: 2013-09-16 | Discharge: 2013-09-16 | Disposition: A | Discharge status: Home / Self Care | Payer: Medicare Other | Source: Ambulatory Visit | Attending: Nephrology | Admitting: Nephrology

## 2013-09-16 DIAGNOSIS — I129 Hypertensive chronic kidney disease with stage 1 through stage 4 chronic kidney disease, or unspecified chronic kidney disease: Secondary | ICD-10-CM | POA: Insufficient documentation

## 2013-09-16 DIAGNOSIS — D649 Anemia, unspecified: Secondary | ICD-10-CM | POA: Diagnosis not present

## 2013-09-16 DIAGNOSIS — N184 Chronic kidney disease, stage 4 (severe): Secondary | ICD-10-CM | POA: Insufficient documentation

## 2013-09-16 LAB — POCT HEMOGLOBIN-HEMACUE: Hemoglobin: 11.8 g/dL — ABNORMAL LOW (ref 13.0–17.0)

## 2013-09-16 MED ORDER — EPOETIN ALFA 10000 UNIT/ML IJ SOLN: 10000.0000 [IU] | INTRAMUSCULAR | Status: DC

## 2013-09-19 ENCOUNTER — Other Ambulatory Visit (INDEPENDENT_AMBULATORY_CARE_PROVIDER_SITE_OTHER): Payer: Medicare Other

## 2013-09-19 ENCOUNTER — Encounter: Payer: Self-pay | Admitting: Internal Medicine

## 2013-09-19 ENCOUNTER — Ambulatory Visit (INDEPENDENT_AMBULATORY_CARE_PROVIDER_SITE_OTHER): Payer: Medicare Other | Admitting: Internal Medicine

## 2013-09-19 VITALS — BP 130/60 | HR 64 | Temp 98.2°F | Wt 167.0 lb

## 2013-09-19 DIAGNOSIS — E1122 Type 2 diabetes mellitus with diabetic chronic kidney disease: Secondary | ICD-10-CM

## 2013-09-19 DIAGNOSIS — Z86718 Personal history of other venous thrombosis and embolism: Secondary | ICD-10-CM

## 2013-09-19 DIAGNOSIS — N183 Chronic kidney disease, stage 3 unspecified: Secondary | ICD-10-CM

## 2013-09-19 DIAGNOSIS — R609 Edema, unspecified: Secondary | ICD-10-CM

## 2013-09-19 DIAGNOSIS — Z85038 Personal history of other malignant neoplasm of large intestine: Secondary | ICD-10-CM

## 2013-09-19 DIAGNOSIS — L539 Erythematous condition, unspecified: Secondary | ICD-10-CM | POA: Diagnosis not present

## 2013-09-19 DIAGNOSIS — E1129 Type 2 diabetes mellitus with other diabetic kidney complication: Secondary | ICD-10-CM

## 2013-09-19 LAB — CBC WITH DIFFERENTIAL/PLATELET
BASOS ABS: 0 10*3/uL (ref 0.0–0.1)
Basophils Relative: 0.6 % (ref 0.0–3.0)
EOS ABS: 0.2 10*3/uL (ref 0.0–0.7)
Eosinophils Relative: 2.8 % (ref 0.0–5.0)
HCT: 32.8 % — ABNORMAL LOW (ref 39.0–52.0)
Hemoglobin: 11.3 g/dL — ABNORMAL LOW (ref 13.0–17.0)
LYMPHS PCT: 27.7 % (ref 12.0–46.0)
Lymphs Abs: 1.7 10*3/uL (ref 0.7–4.0)
MCHC: 34.4 g/dL (ref 30.0–36.0)
MCV: 97.9 fl (ref 78.0–100.0)
Monocytes Absolute: 0.5 10*3/uL (ref 0.1–1.0)
Monocytes Relative: 7.7 % (ref 3.0–12.0)
NEUTROS PCT: 61.2 % (ref 43.0–77.0)
Neutro Abs: 3.8 10*3/uL (ref 1.4–7.7)
Platelets: 224 10*3/uL (ref 150.0–400.0)
RBC: 3.35 Mil/uL — ABNORMAL LOW (ref 4.22–5.81)
RDW: 14.4 % (ref 11.5–15.5)
WBC: 6.2 10*3/uL (ref 4.0–10.5)

## 2013-09-19 LAB — HEPATIC FUNCTION PANEL
ALT: 20 U/L (ref 0–53)
AST: 29 U/L (ref 0–37)
Albumin: 3.4 g/dL — ABNORMAL LOW (ref 3.5–5.2)
Alkaline Phosphatase: 82 U/L (ref 39–117)
BILIRUBIN DIRECT: 0.1 mg/dL (ref 0.0–0.3)
BILIRUBIN TOTAL: 0.3 mg/dL (ref 0.2–1.2)
Total Protein: 6.3 g/dL (ref 6.0–8.3)

## 2013-09-19 LAB — BASIC METABOLIC PANEL
BUN: 26 mg/dL — ABNORMAL HIGH (ref 6–23)
CHLORIDE: 102 meq/L (ref 96–112)
CO2: 30 mEq/L (ref 19–32)
Calcium: 8.9 mg/dL (ref 8.4–10.5)
Creatinine, Ser: 1.7 mg/dL — ABNORMAL HIGH (ref 0.4–1.5)
GFR: 39.81 mL/min — ABNORMAL LOW (ref >60.00)
Glucose, Bld: 103 mg/dL — ABNORMAL HIGH (ref 70–99)
Potassium: 3.7 mEq/L (ref 3.5–5.1)
Sodium: 140 mEq/L (ref 135–145)

## 2013-09-19 MED ORDER — RIVAROXABAN 15 MG PO TABS: 15.0000 mg | ORAL_TABLET | Freq: Two times a day (BID) | ORAL | Status: DC

## 2013-09-19 NOTE — Progress Notes (Signed)
   Subjective:    Patient ID: Nathan Blackwell, male    DOB: 08-24-1920, 80 y.o.   MRN: [id number]  HPI  He presents with bilateral progressive swelling of the lower extremities. This was associated with erythema particularly in the shins. This has progressed significantly over the last 2 days despite him taking 80 mg Lasix in the morning and 40 mg the evening.  He has actually gained almost 30 pounds the last 3 weeks. He does have renal failure and is followed by Dr. Posey Pronto. His last labs were 6/9. We do not have those available to [place]. The last labs in the chart were 4/15 which revealed creatinine 2.5, BUN 33, and GFR 26.  He denies eating salt on his food.  He has a past history of deep venous thrombosis. He was on warfarin but this was discontinued because of recurrent falling.    Review of Systems  He denies chest pain, palpitations, dyspnea, paroxysmal nocturnal dyspnea  He denies abdominal pain, rectal bleeding, or melena.       Objective:   Physical Exam  Significant or distinguishing  findings on physical exam are documented first.  Below that are other systems examined & findings.   He appears frail but adequately nourished He wears bilateral hearing aids. There is a small eschar over the bridge of his nose which his wife states is from a CPAP machine. Heart rhythm is slow and regular. Abdomen is protuberant but nontender There is extensive edema of the lower extremities which is 2+ over the feet and 1+ of the upper legs bilaterally. It does extend to the knees. Pedal pulses are not palpable because of and edema His erythema of the shins greater in the right lower extremity than the left. Equivocal Homans on the left He ambulates with a rolling walker.        Assessment & Plan:  #1 dramatic progressive edema in the context of a 30 pound weight gain. Rule out progressive renal or hepatorenal insufficiency  #2 erythema of the lower extremities; cellulitis versus  stasis   #3 past history of deep venous thrombosis. Rule out  DVT  Plan: His hepatic and renal function will be checked.  He'll be increased to Lasix 80 mg twice a day.  Samples of Xarelto 15 mg twice a day will be initiated pending return of the d-dimer. He'll be asked to avoid all nonsteroidals or aspirin-type products. He should monitor his stool  & urine for any signs of bleeding.

## 2013-09-19 NOTE — Progress Notes (Signed)
Pre visit review using our clinic review tool, if applicable. No additional management support is needed unless otherwise documented below in the visit note. 

## 2013-09-19 NOTE — Progress Notes (Signed)
   Subjective:    Patient ID: Nathan Blackwell, male    DOB: 1920/04/15, 80 y.o.   MRN: [id number]  HPI Pt presents today with BLE swelling that began a few days ago. There is associated redness over his shins. The redness has been extending up his legs the past two days per pt's wife. He has not had any fevers. He does have a history of DVT in RLE. He has a history of stage III CKD . Per pt's nephrologist he is now taking lasix 80mg  q AM and 40mg  q PM.   Additionally pt is having swelling in the lateral aspect of his L hand that began 2 weeks ago. He does not recall any injury. There is no pain or numbness or tingling. The swelling has remained constant since it was noticed.   Labs from 4/15 Cr=2.5, BUN=33, GFR=26.    Review of Systems  Constitutional: Negative for fever.  Respiratory: Positive for cough. Negative for shortness of breath.        No nocturnal paroxysmal dyspnea   Cardiovascular: Negative for chest pain and palpitations.       Objective:   Physical Exam +4 pitting edema in BLE Erythema on BLE shins. R>L Petechiae on feet bilateral      Assessment & Plan:

## 2013-09-19 NOTE — Patient Instructions (Addendum)
   Please increase the Lasix to 80 mg twice a day. Do not add salt; avoid vinegary tasting foods as these have increased salt intake.  Take the samples of the oral blood thinner twice a day. Monitor  urine and stool for any signs of bleeding. This would include black or tarry stool or rectal bleeding.  Do not take aspirin or anti-inflammatory agents as ibuprofen with this blood thinner.  Your next office appointment will be determined based upon review of your pending labs. Those instructions will be transmitted to you through My Chart.

## 2013-09-20 ENCOUNTER — Telehealth: Payer: Self-pay | Admitting: *Deleted

## 2013-09-20 LAB — D-DIMER, QUANTITATIVE: D-Dimer, Quant: 3.14 ug/mL-FEU — ABNORMAL HIGH (ref 0.00–0.48)

## 2013-09-20 NOTE — Telephone Encounter (Signed)
[name] called with [name] for a Lab Alert on Mr. Nathan Blackwell, Nathan Blackwell states [facility] lab had result of 3.14

## 2013-09-20 NOTE — Telephone Encounter (Signed)
Reviewed office note.  Pt's oxygen sat/BP and HR were stable.  He is on empiric xarelto.  Will need pulmonary imaging on Monday when office re-opens.  Will defer this to Dr. Linna Darner.  Amy, in the meantime, please contact pt and let him know that office should be contacting him on Monday for some additional imaging to evaluate him for PE.  In the meantime, he should continue xarelto and go to the ER if he develops CP or SOB.

## 2013-09-22 ENCOUNTER — Ambulatory Visit (HOSPITAL_COMMUNITY)
Admission: RE | Admit: 2013-09-22 | Discharge: 2013-09-22 | Disposition: A | Discharge status: Home / Self Care | Payer: Medicare Other | Source: Ambulatory Visit | Attending: Cardiovascular Disease | Admitting: Cardiovascular Disease

## 2013-09-22 ENCOUNTER — Other Ambulatory Visit: Payer: Self-pay | Admitting: Internal Medicine

## 2013-09-22 DIAGNOSIS — R7989 Other specified abnormal findings of blood chemistry: Secondary | ICD-10-CM

## 2013-09-22 DIAGNOSIS — R609 Edema, unspecified: Secondary | ICD-10-CM

## 2013-09-22 DIAGNOSIS — M7989 Other specified soft tissue disorders: Secondary | ICD-10-CM | POA: Diagnosis not present

## 2013-09-22 DIAGNOSIS — Z86718 Personal history of other venous thrombosis and embolism: Secondary | ICD-10-CM

## 2013-09-22 NOTE — Progress Notes (Signed)
Bilateral Lower Ext. Venous Duplex Completed. Preliminary results by tech - Negative for acute DVT in right leg. There is chronic appearing non obstructing DVT in the right leg. Negative DVT in the left leg.  Oda Cogan, BS, RDMS, RVT

## 2013-09-23 ENCOUNTER — Telehealth: Payer: Self-pay | Admitting: Internal Medicine

## 2013-09-23 ENCOUNTER — Ambulatory Visit (INDEPENDENT_AMBULATORY_CARE_PROVIDER_SITE_OTHER): Payer: Medicare Other | Admitting: Internal Medicine

## 2013-09-23 ENCOUNTER — Encounter: Payer: Self-pay | Admitting: Internal Medicine

## 2013-09-23 VITALS — BP 130/80 | HR 59 | Temp 98.2°F | Wt 168.0 lb

## 2013-09-23 DIAGNOSIS — I10 Essential (primary) hypertension: Secondary | ICD-10-CM | POA: Diagnosis not present

## 2013-09-23 DIAGNOSIS — I82409 Acute embolism and thrombosis of unspecified deep veins of unspecified lower extremity: Secondary | ICD-10-CM | POA: Diagnosis not present

## 2013-09-23 DIAGNOSIS — R609 Edema, unspecified: Secondary | ICD-10-CM

## 2013-09-23 DIAGNOSIS — I82401 Acute embolism and thrombosis of unspecified deep veins of right lower extremity: Secondary | ICD-10-CM

## 2013-09-23 MED ORDER — FUROSEMIDE 80 MG PO TABS: 80.0000 mg | ORAL_TABLET | Freq: Two times a day (BID) | ORAL | Status: DC

## 2013-09-23 NOTE — Assessment & Plan Note (Signed)
Old clot only on recent testing, to finish xarelto sample, then stop due to risk of falls

## 2013-09-23 NOTE — Progress Notes (Signed)
Subjective:    Patient ID: Nathan Blackwell, male    DOB: 1921-02-23, 80 y.o.   MRN: [id number]  HPI  Here to f/u leg swelling, Some confusion about the lasix, only taking 40 bid.  On xarelto, recent vensou doppler with evidence for old clot only.   No wt change, no fever, worsening pain Pt denies chest pain, increased sob or doe, wheezing, orthopnea, PND,  palpitations, dizziness or syncope.  Has not received the card in the mail, no renal f/u appt yet, usually seen every 3-4 months.  Has been high fall risk in the past, not the ideal candidate for DVT prevention with xarelto, has samples to last until later this wk.  Does also have a tender area to distal deltoid insertion site left arm, no swelling.  Has a new large bruise right arm since taking xarleto with just bumping the arm, no other overt bleeding Past Medical History  Diagnosis Date  . Diabetes mellitus type II   . Hyperlipidemia   . Hypertension   . Osteoporosis   . Prostatic hypertrophy     benign  . OSA (obstructive sleep apnea)   . Rotator cuff syndrome     chronic  . Elevated PSA   . Hypothyroidism   . Nephrolithiasis   . Thyroid nodule   . GERD (gastroesophageal reflux disease)   . Colitis, Clostridium difficile     presumed 11/08  . Dementia   . Anemia     NOS  . Vitamin B 12 deficiency   . BRADYCARDIA 06/10/2009  . COLON CANCER, HX OF 09/20/2006  . HYPOTHYROIDISM 01/14/2007  . DIABETES MELLITUS, TYPE II 09/20/2006  . HYPERLIPIDEMIA 09/20/2006  . ANEMIA-NOS 06/23/2008  . DEMENTIA 05/14/2007  . DEPRESSION 02/27/2007  . SLEEP APNEA, OBSTRUCTIVE 01/14/2007  . HYPERTENSION 09/20/2006  . GERD 01/14/2007  . OSTEOPOROSIS 10/08/2006  . PERIPHERAL EDEMA 09/03/2007  . RENAL INSUFFICIENCY 04/01/2010  . B12 deficiency 05/25/2010  . CLOSTRIDIUM DIFFICILE COLITIS 01/30/2007  . THYROID NODULE 01/14/2007  . CONSTIPATION, RECURRENT 01/06/2010  . BENIGN PROSTATIC HYPERTROPHY 10/08/2006  . ARTHRITIS 02/23/2007  . BACK PAIN, THORACIC  REGION 06/04/2008  . COLONIC POLYPS, HX OF 10/08/2006  . NEPHROLITHIASIS, HX OF 01/14/2007  . Cancer of colon dx'd 1998  . Cancer of vocal cord dx'd 1998  . 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 DVT of lower limb, acute 01/12/2011  . Sleep apnea     CPAP dependent   Past Surgical History  Procedure Laterality Date  . Cholecystectomy  1999  . Inguinal herniorrhapy  1984  . Appendectomy  1998  . Colon resection  1998  . Skin cancer extraction    . Cataract extraction, bilateral      reports that he has never smoked. He has never used smokeless tobacco. He reports that he does not drink alcohol or use illicit drugs. family history includes Diabetes in his mother; Stroke in his father. Allergies  Allergen Reactions  . Biaxin [Clarithromycin] Other (See Comments)    Unknown   . Other Other (See Comments)    Nephrologist has told patient not to eat many different foods (tomatoes, Green Foods, etc).   . Sulfa Antibiotics Other (See Comments)    Unknown    Current Outpatient Prescriptions on File Prior to Visit  Medication Sig Dispense Refill  . acetaminophen (TYLENOL) 500 MG tablet Take 1 tablet (500 mg total) by mouth every 6 (six) hours as needed for pain.  30 tablet  0  .  cholecalciferol (VITAMIN D) 1000 UNITS tablet Take 1,000 Units by mouth daily.      . citalopram (CELEXA) 20 MG tablet Take 20 mg by mouth daily.      Marland Kitchen CREON 12000 UNITS CPEP capsule TAKE 1 TO 2 CAPSULES BY MOUTH THREE TIMES A DAY WITH EACH MEAL  180 each  3  . docusate sodium (COLACE) 100 MG capsule Take 100 mg by mouth at bedtime.        . furosemide (LASIX) 20 MG tablet Take 20-40 mg by mouth 2 (two) times daily. 40mg  in the morning; 20mg  in the afternoon      . hydrALAZINE (APRESOLINE) 25 MG tablet Take 12.5 mg by mouth 3 (three) times daily.      . isosorbide mononitrate (IMDUR) 30 MG 24 hr tablet Take 1 tablet (30 mg total) by mouth daily.  30 tablet  11  . JANUVIA 50 MG tablet TAKE 1 TABLET BY MOUTH DAILY   30 tablet  11  . lansoprazole (PREVACID) 30 MG capsule TAKE 1 CAPSULE BY MOUTH ONCE A DAY  30 capsule  11  . levothyroxine (SYNTHROID, LEVOTHROID) 100 MCG tablet Take 100 mcg by mouth daily before breakfast.      . Linaclotide (LINZESS) 290 MCG CAPS capsule Take 1 capsule (290 mcg total) by mouth daily as needed.  30 capsule  5  . OLANZapine (ZYPREXA) 5 MG tablet Take 5 mg by mouth daily.      . polyethylene glycol (MIRALAX / GLYCOLAX) packet Take 17 g by mouth 2 (two) times daily.  14 each  0  . QUEtiapine (SEROQUEL) 50 MG tablet Take 1 tablet (50 mg total) by mouth at bedtime.  90 tablet  3  . Rivaroxaban (XARELTO) 15 MG TABS tablet Take 1 tablet (15 mg total) by mouth 2 (two) times daily with a meal.  14 tablet  0  . tamsulosin (FLOMAX) 0.4 MG CAPS capsule Take 0.4 mg by mouth at bedtime.       No current facility-administered medications on file prior to visit.    Review of Systems  Constitutional: Negative for unusual diaphoresis or other sweats  HENT: Negative for ringing in ear Eyes: Negative for double vision or worsening visual disturbance.  Respiratory: Negative for choking and stridor.   Gastrointestinal: Negative for vomiting or other signifcant bowel change Genitourinary: Negative for hematuria or decreased urine volume.  Musculoskeletal: Negative for other MSK pain or swelling Skin: Negative for color change and worsening wound.  Neurological: Negative for tremors and numbness other than noted  Psychiatric/Behavioral: Negative for decreased concentration or agitation other than above       Objective:   Physical Exam BP 130/80  Pulse 59  Temp(Src) 98.2 F (36.8 C) (Oral)  Wt 168 lb (76.204 kg)  SpO2 97% VS noted, not ill appearing Constitutional: Pt appears well-developed, well-nourished.  HENT: Head: NCAT.  Right Ear: External ear normal.  Left Ear: External ear normal.  Eyes: . Pupils are equal, round, and reactive to light. Conjunctivae and EOM are normal Neck:  Normal range of motion. Neck supple.  Cardiovascular: Normal rate and regular rhythm.   Pulmonary/Chest: Effort normal and breath sounds normal.  Neurological: Pt is alert. Not confused , motor grossly intact Skin: Skin is warm. No rash, 3+ edema to above knees Psychiatric: Pt behavior is normal. No agitation.     Assessment & Plan:

## 2013-09-23 NOTE — Assessment & Plan Note (Signed)
suspect related to ckd; for increase lasix 80 bid, f/u 1 wk, check daily wts

## 2013-09-23 NOTE — Progress Notes (Signed)
Pre visit review using our clinic review tool, if applicable. No additional management support is needed unless otherwise documented below in the visit note. 

## 2013-09-23 NOTE — Assessment & Plan Note (Signed)
stable overall by history and exam, recent data reviewed with pt, and pt to continue medical treatment as before,  to f/u any worsening symptoms or concerns BP Readings from Last 3 Encounters:  09/23/13 130/80  09/19/13 130/60  09/16/13 152/74

## 2013-09-23 NOTE — Patient Instructions (Addendum)
OK to increase the lasix (furosemide) to 80 mg twice per day, and a new prescription is sent to your pharmacy  Ok to finish the xarelto samples, then stop  Please continue all other medications as before, and refills have been done if requested.  Please have the pharmacy call with any other refills you may need.  Please continue your efforts at being more active, low cholesterol diet, and weight control.  Please keep your appointments with your specialists as you may have planned  Please check your weight daily at home first thing in the AM, write them down, and bring to next visit  Please return in 1 week, or sooner if needed

## 2013-09-23 NOTE — Telephone Encounter (Signed)
Ok with me 

## 2013-09-23 NOTE — Telephone Encounter (Signed)
Wife informed ok to keep 8/5 appt.

## 2013-09-23 NOTE — Telephone Encounter (Signed)
Dr. Jenny Reichmann wanted Nathan Blackwell back in one week.   The earliest appt we could get him in was 8/5.  Nathan Blackwell wanted to make sure that was ok.

## 2013-09-29 ENCOUNTER — Telehealth (HOSPITAL_COMMUNITY): Payer: Self-pay | Admitting: *Deleted

## 2013-09-30 ENCOUNTER — Encounter (HOSPITAL_COMMUNITY)
Admission: RE | Admit: 2013-09-30 | Discharge: 2013-09-30 | Disposition: A | Discharge status: Home / Self Care | Payer: Medicare Other | Source: Ambulatory Visit | Attending: Nephrology | Admitting: Nephrology

## 2013-09-30 ENCOUNTER — Ambulatory Visit: Payer: Medicare Other | Admitting: Pulmonary Disease

## 2013-09-30 DIAGNOSIS — D649 Anemia, unspecified: Secondary | ICD-10-CM | POA: Diagnosis not present

## 2013-09-30 LAB — POCT HEMOGLOBIN-HEMACUE: HEMOGLOBIN: 11.3 g/dL — AB (ref 13.0–17.0)

## 2013-09-30 MED ORDER — EPOETIN ALFA 10000 UNIT/ML IJ SOLN
INTRAMUSCULAR | Status: AC
  Filled 2013-09-30: qty 1

## 2013-09-30 MED ORDER — EPOETIN ALFA 10000 UNIT/ML IJ SOLN
10000.0000 [IU] | INTRAMUSCULAR | Status: DC
  Administered 2013-09-30: 10000 [IU] via SUBCUTANEOUS

## 2013-10-08 ENCOUNTER — Telehealth: Payer: Self-pay

## 2013-10-08 ENCOUNTER — Ambulatory Visit (INDEPENDENT_AMBULATORY_CARE_PROVIDER_SITE_OTHER): Payer: Medicare Other | Admitting: Internal Medicine

## 2013-10-08 ENCOUNTER — Encounter: Payer: Self-pay | Admitting: Internal Medicine

## 2013-10-08 VITALS — BP 132/78 | HR 67 | Temp 98.2°F | Wt 171.4 lb

## 2013-10-08 DIAGNOSIS — N183 Chronic kidney disease, stage 3 unspecified: Secondary | ICD-10-CM | POA: Diagnosis not present

## 2013-10-08 DIAGNOSIS — R609 Edema, unspecified: Secondary | ICD-10-CM

## 2013-10-08 DIAGNOSIS — I82409 Acute embolism and thrombosis of unspecified deep veins of unspecified lower extremity: Secondary | ICD-10-CM

## 2013-10-08 MED ORDER — FUROSEMIDE 80 MG PO TABS: ORAL_TABLET | ORAL | Status: DC

## 2013-10-08 MED ORDER — AMOXICILLIN 500 MG PO CAPS: 1000.0000 mg | ORAL_CAPSULE | Freq: Two times a day (BID) | ORAL | Status: DC

## 2013-10-08 NOTE — Assessment & Plan Note (Signed)
Ok for incr lasix 120 bid, f/u next visit

## 2013-10-08 NOTE — Progress Notes (Signed)
Pre visit review using our clinic review tool, if applicable. No additional management support is needed unless otherwise documented below in the visit note. 

## 2013-10-08 NOTE — Assessment & Plan Note (Signed)
Recent dopplers neg for acute dvt,  to f/u any worsening symptoms or concerns

## 2013-10-08 NOTE — Progress Notes (Signed)
Subjective:    Patient ID: Nathan Blackwell, male    DOB: 04/06/1920, 80 y.o.   MRN: [id number]  HPI   Here to f/u, overall doing ok, but Wt still 171, up from 147 in mar 2015, unable to improve the swelling and wt stable last 2wks despite good compliance with lasix. Still sees Dr patel/nephrology, last visit June 17, due f/u at 3-4 months.  Has appt here aug 27 in f/u.  Pt denies chest pain, increased sob or doe, wheezing, orthopnea, PND, palpitations, dizziness or syncope.   Pt denies fever, wt loss, night sweats, loss of appetite, or other constitutional symptoms. Recent doppler neg for DVT . Past Medical History  Diagnosis Date  . Diabetes mellitus type II   . Hyperlipidemia   . Hypertension   . Osteoporosis   . Prostatic hypertrophy     benign  . OSA (obstructive sleep apnea)   . Rotator cuff syndrome     chronic  . Elevated PSA   . Hypothyroidism   . Nephrolithiasis   . Thyroid nodule   . GERD (gastroesophageal reflux disease)   . Colitis, Clostridium difficile     presumed 11/08  . Dementia   . Anemia     NOS  . Vitamin B 12 deficiency   . BRADYCARDIA 06/10/2009  . COLON CANCER, HX OF 09/20/2006  . HYPOTHYROIDISM 01/14/2007  . DIABETES MELLITUS, TYPE II 09/20/2006  . HYPERLIPIDEMIA 09/20/2006  . ANEMIA-NOS 06/23/2008  . DEMENTIA 05/14/2007  . DEPRESSION 02/27/2007  . SLEEP APNEA, OBSTRUCTIVE 01/14/2007  . HYPERTENSION 09/20/2006  . GERD 01/14/2007  . OSTEOPOROSIS 10/08/2006  . PERIPHERAL EDEMA 09/03/2007  . RENAL INSUFFICIENCY 04/01/2010  . B12 deficiency 05/25/2010  . CLOSTRIDIUM DIFFICILE COLITIS 01/30/2007  . THYROID NODULE 01/14/2007  . CONSTIPATION, RECURRENT 01/06/2010  . BENIGN PROSTATIC HYPERTROPHY 10/08/2006  . ARTHRITIS 02/23/2007  . BACK PAIN, THORACIC REGION 06/04/2008  . COLONIC POLYPS, HX OF 10/08/2006  . NEPHROLITHIASIS, HX OF 01/14/2007  . Cancer of colon dx'd 1998  . Cancer of vocal cord dx'd 1998  . 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 DVT of lower limb,  acute 01/12/2011  . Sleep apnea     CPAP dependent   Past Surgical History  Procedure Laterality Date  . Cholecystectomy  1999  . Inguinal herniorrhapy  1984  . Appendectomy  1998  . Colon resection  1998  . Skin cancer extraction    . Cataract extraction, bilateral      reports that he has never smoked. He has never used smokeless tobacco. He reports that he does not drink alcohol or use illicit drugs. family history includes Diabetes in his mother; Stroke in his father. Allergies  Allergen Reactions  . Biaxin [Clarithromycin] Other (See Comments)    Unknown   . Other Other (See Comments)    Nephrologist has told patient not to eat many different foods (tomatoes, Green Foods, etc).   . Sulfa Antibiotics Other (See Comments)    Unknown    Current Outpatient Prescriptions on File Prior to Visit  Medication Sig Dispense Refill  . cholecalciferol (VITAMIN D) 1000 UNITS tablet Take 1,000 Units by mouth daily.      . citalopram (CELEXA) 20 MG tablet Take 20 mg by mouth daily.      Marland Kitchen CREON 12000 UNITS CPEP capsule TAKE 1 TO 2 CAPSULES BY MOUTH THREE TIMES A DAY WITH EACH MEAL  180 each  3  . docusate sodium (COLACE) 100 MG capsule Take 100  mg by mouth at bedtime.        . hydrALAZINE (APRESOLINE) 25 MG tablet Take 12.5 mg by mouth 3 (three) times daily.      . isosorbide mononitrate (IMDUR) 30 MG 24 hr tablet Take 1 tablet (30 mg total) by mouth daily.  30 tablet  11  . JANUVIA 50 MG tablet TAKE 1 TABLET BY MOUTH DAILY  30 tablet  11  . lansoprazole (PREVACID) 30 MG capsule TAKE 1 CAPSULE BY MOUTH ONCE A DAY  30 capsule  11  . levothyroxine (SYNTHROID, LEVOTHROID) 100 MCG tablet Take 100 mcg by mouth daily before breakfast.      . Linaclotide (LINZESS) 290 MCG CAPS capsule Take 1 capsule (290 mcg total) by mouth daily as needed.  30 capsule  5  . OLANZapine (ZYPREXA) 5 MG tablet Take 5 mg by mouth daily.      . polyethylene glycol (MIRALAX / GLYCOLAX) packet Take 17 g by mouth 2 (two)  times daily.  14 each  0  . QUEtiapine (SEROQUEL) 50 MG tablet Take 1 tablet (50 mg total) by mouth at bedtime.  90 tablet  3  . Rivaroxaban (XARELTO) 15 MG TABS tablet Take 1 tablet (15 mg total) by mouth 2 (two) times daily with a meal.  14 tablet  0  . tamsulosin (FLOMAX) 0.4 MG CAPS capsule Take 0.4 mg by mouth at bedtime.       No current facility-administered medications on file prior to visit.   Review of Systems  Constitutional: Negative for unusual diaphoresis or other sweats  HENT: Negative for ringing in ear Eyes: Negative for double vision or worsening visual disturbance.  Respiratory: Negative for choking and stridor.   Gastrointestinal: Negative for vomiting or other signifcant bowel change Genitourinary: Negative for hematuria or decreased urine volume.  Musculoskeletal: Negative for other MSK pain or swelling Skin: Negative for color change and worsening wound.  Neurological: Negative for tremors and numbness other than noted  Psychiatric/Behavioral: Negative for decreased concentration or agitation other than above       Objective:   Physical Exam BP 132/78  Pulse 67  Temp(Src) 98.2 F (36.8 C) (Oral)  Wt 171 lb 6 oz (77.735 kg)  SpO2 96% VS noted,  Constitutional: Pt appears well-developed, well-nourished.  HENT: Head: NCAT.  Right Ear: External ear normal.  Left Ear: External ear normal.  Eyes: . Pupils are equal, round, and reactive to light. Conjunctivae and EOM are normal Neck: Normal range of motion. Neck supple.  Cardiovascular: Normal rate and regular rhythm.   Pulmonary/Chest: Effort normal and breath sounds normal.  Neurological: Pt is alert. Not confused , motor grossly intact Skin: Skin is warm. 2+ LE edema to knees with nontender erythema distal Psychiatric: Pt behavior is normal. No agitation.      Assessment & Plan:

## 2013-10-08 NOTE — Telephone Encounter (Signed)
Ok for now, though clear liquids like gatorade or similar would be better if he will not drink water

## 2013-10-08 NOTE — Assessment & Plan Note (Signed)
For f/u lab next visit 

## 2013-10-08 NOTE — Patient Instructions (Signed)
Ok to increase the lasix to 120 mg twice per day  Please take all new medication as prescribed  Please continue all other medications as before, and refills have been done if requested.  Please have the pharmacy call with any other refills you may need.  Please keep your appointments with your specialists as you may have planned

## 2013-10-08 NOTE — Telephone Encounter (Signed)
The patients wife called to inform she forgot to mention at [place] today the patient drinks no water and takes all his medications with carbonated soft drinks.  Advise

## 2013-10-09 ENCOUNTER — Ambulatory Visit (INDEPENDENT_AMBULATORY_CARE_PROVIDER_SITE_OTHER): Payer: Medicare Other | Admitting: Pulmonary Disease

## 2013-10-09 ENCOUNTER — Encounter: Payer: Self-pay | Admitting: Pulmonary Disease

## 2013-10-09 VITALS — BP 110/60 | HR 52 | Temp 97.6°F | Ht 68.5 in | Wt 167.8 lb

## 2013-10-09 DIAGNOSIS — G4733 Obstructive sleep apnea (adult) (pediatric): Secondary | ICD-10-CM | POA: Diagnosis not present

## 2013-10-09 NOTE — Telephone Encounter (Signed)
Patients wife informed of MD instructions

## 2013-10-09 NOTE — Progress Notes (Signed)
   Subjective:    Patient ID: Nathan Blackwell, male    DOB: 05-Feb-1921, 80 y.o.   MRN: [id number]  HPI Patient comes in today for followup of his obstructive sleep apnea. He is wearing his positive pressure device compliantly, but continues to have frustration when he has to deal with the mask because of nocturia. He feels that he sleeps fairly well with the device, although his sleep is fragmented because of his high dose diuretic use.  He has [facility] to the [facility] Center for a mask fitting, and feels the current mask is doing a much better job without irritating his nose.   Review of Systems  Constitutional: Negative for fever and unexpected weight change.  HENT: Negative for congestion, dental problem, ear pain, nosebleeds, postnasal drip, rhinorrhea, sinus pressure, sneezing, sore throat and trouble swallowing.   Eyes: Negative for redness and itching.  Respiratory: Negative for cough, chest tightness, shortness of breath and wheezing.   Cardiovascular: Negative for palpitations and leg swelling.  Gastrointestinal: Negative for nausea and vomiting.  Genitourinary: Negative for dysuria.  Musculoskeletal: Negative for joint swelling.  Skin: Negative for rash.  Neurological: Negative for headaches.  Hematological: Does not bruise/bleed easily.  Psychiatric/Behavioral: Negative for dysphoric mood. The patient is not nervous/anxious.        Objective:   Physical Exam Frail-appearing male in no acute distress No skin breakdown or pressure necrosis from the CPAP mask Nose without purulence or discharge noted Neck without lymphadenopathy or thyromegaly Lower extremities with 3+ edema, no cyanosis Alert and oriented, moves all 4 extremities.       Assessment & Plan:

## 2013-10-09 NOTE — Assessment & Plan Note (Signed)
The patient is wearing his positive pressure device compliantly, and has found a mask that is not irritating his nose as much as the prior. He feels that he is tolerating the device well, but it continues to year it take him and his wife at times during the night because of the inconvenience. He has frequent urination during the night because of his high dose diuretics, and having to deal with this is bothersome. I have explained to him again the importance of treating his sleep apnea, and how this can affect his cardiovascular health. He is willing to stick with the device.

## 2013-10-09 NOTE — Patient Instructions (Signed)
Continue on cpap, and keep up with mask cushion changes and supplies. followup with me again in one year.

## 2013-10-12 NOTE — Assessment & Plan Note (Signed)
stable overall by history and exam, recent data reviewed with pt, and pt to continue medical treatment as before,  to f/u any worsening symptoms or concerns, fu renal as followed Lab Results  Component Value Date   CREATININE 1.7* 09/19/2013

## 2013-10-18 ENCOUNTER — Other Ambulatory Visit: Payer: Self-pay | Admitting: Internal Medicine

## 2013-10-21 ENCOUNTER — Encounter (HOSPITAL_COMMUNITY)
Admission: RE | Admit: 2013-10-21 | Discharge: 2013-10-21 | Disposition: A | Discharge status: Home / Self Care | Payer: Medicare Other | Source: Ambulatory Visit | Attending: Nephrology | Admitting: Nephrology

## 2013-10-21 DIAGNOSIS — N184 Chronic kidney disease, stage 4 (severe): Secondary | ICD-10-CM | POA: Diagnosis not present

## 2013-10-21 DIAGNOSIS — I129 Hypertensive chronic kidney disease with stage 1 through stage 4 chronic kidney disease, or unspecified chronic kidney disease: Secondary | ICD-10-CM | POA: Insufficient documentation

## 2013-10-21 DIAGNOSIS — D649 Anemia, unspecified: Secondary | ICD-10-CM | POA: Diagnosis not present

## 2013-10-21 LAB — FERRITIN: Ferritin: 66 ng/mL (ref 22–322)

## 2013-10-21 LAB — IRON AND TIBC
Iron: 80 ug/dL (ref 42–135)
SATURATION RATIOS: 29 % (ref 20–55)
TIBC: 280 ug/dL (ref 215–435)
UIBC: 200 ug/dL (ref 125–400)

## 2013-10-21 LAB — POCT HEMOGLOBIN-HEMACUE: Hemoglobin: 11.1 g/dL — ABNORMAL LOW (ref 13.0–17.0)

## 2013-10-21 MED ORDER — EPOETIN ALFA 10000 UNIT/ML IJ SOLN
INTRAMUSCULAR | Status: AC
  Filled 2013-10-21: qty 1

## 2013-10-21 MED ORDER — EPOETIN ALFA 10000 UNIT/ML IJ SOLN
10000.0000 [IU] | INTRAMUSCULAR | Status: DC
  Administered 2013-10-21: 10000 [IU] via SUBCUTANEOUS

## 2013-10-30 ENCOUNTER — Encounter: Payer: Self-pay | Admitting: Internal Medicine

## 2013-10-30 ENCOUNTER — Ambulatory Visit (INDEPENDENT_AMBULATORY_CARE_PROVIDER_SITE_OTHER): Payer: Medicare Other | Admitting: Internal Medicine

## 2013-10-30 ENCOUNTER — Other Ambulatory Visit (INDEPENDENT_AMBULATORY_CARE_PROVIDER_SITE_OTHER): Payer: Medicare Other

## 2013-10-30 VITALS — BP 140/70 | HR 66 | Temp 98.0°F | Wt 157.0 lb

## 2013-10-30 DIAGNOSIS — Z7901 Long term (current) use of anticoagulants: Secondary | ICD-10-CM | POA: Diagnosis not present

## 2013-10-30 DIAGNOSIS — R1310 Dysphagia, unspecified: Secondary | ICD-10-CM

## 2013-10-30 DIAGNOSIS — N183 Chronic kidney disease, stage 3 unspecified: Secondary | ICD-10-CM

## 2013-10-30 DIAGNOSIS — E119 Type 2 diabetes mellitus without complications: Secondary | ICD-10-CM | POA: Diagnosis not present

## 2013-10-30 DIAGNOSIS — Z23 Encounter for immunization: Secondary | ICD-10-CM

## 2013-10-30 DIAGNOSIS — B359 Dermatophytosis, unspecified: Secondary | ICD-10-CM

## 2013-10-30 DIAGNOSIS — B370 Candidal stomatitis: Secondary | ICD-10-CM | POA: Diagnosis not present

## 2013-10-30 LAB — HEPATIC FUNCTION PANEL
ALBUMIN: 3.8 g/dL (ref 3.5–5.2)
ALK PHOS: 86 U/L (ref 39–117)
ALT: 10 U/L (ref 0–53)
AST: 22 U/L (ref 0–37)
BILIRUBIN DIRECT: 0.1 mg/dL (ref 0.0–0.3)
Total Bilirubin: 0.6 mg/dL (ref 0.2–1.2)
Total Protein: 7.3 g/dL (ref 6.0–8.3)

## 2013-10-30 LAB — BASIC METABOLIC PANEL
BUN: 23 mg/dL (ref 6–23)
CALCIUM: 9.3 mg/dL (ref 8.4–10.5)
CO2: 33 mEq/L — ABNORMAL HIGH (ref 19–32)
Chloride: 101 mEq/L (ref 96–112)
Creatinine, Ser: 1.7 mg/dL — ABNORMAL HIGH (ref 0.4–1.5)
GFR: 39.27 mL/min — AB (ref >60.00)
GLUCOSE: 104 mg/dL — AB (ref 70–99)
Potassium: 3.8 mEq/L (ref 3.5–5.1)
Sodium: 142 mEq/L (ref 135–145)

## 2013-10-30 LAB — CBC WITH DIFFERENTIAL/PLATELET
BASOS ABS: 0 10*3/uL (ref 0.0–0.1)
Basophils Relative: 0.5 % (ref 0.0–3.0)
Eosinophils Absolute: 0.1 10*3/uL (ref 0.0–0.7)
Eosinophils Relative: 1.6 % (ref 0.0–5.0)
HEMATOCRIT: 37.4 % — AB (ref 39.0–52.0)
Hemoglobin: 12.5 g/dL — ABNORMAL LOW (ref 13.0–17.0)
LYMPHS ABS: 1.8 10*3/uL (ref 0.7–4.0)
Lymphocytes Relative: 23 % (ref 12.0–46.0)
MCHC: 33.4 g/dL (ref 30.0–36.0)
MCV: 98.1 fl (ref 78.0–100.0)
Monocytes Absolute: 0.5 10*3/uL (ref 0.1–1.0)
Monocytes Relative: 6 % (ref 3.0–12.0)
NEUTROS ABS: 5.5 10*3/uL (ref 1.4–7.7)
Neutrophils Relative %: 68.9 % (ref 43.0–77.0)
Platelets: 265 10*3/uL (ref 150.0–400.0)
RBC: 3.81 Mil/uL — ABNORMAL LOW (ref 4.22–5.81)
RDW: 14.1 % (ref 11.5–15.5)
WBC: 8 10*3/uL (ref 4.0–10.5)

## 2013-10-30 LAB — PROTIME-INR
INR: 1.1 ratio — ABNORMAL HIGH (ref 0.8–1.0)
Prothrombin Time: 11.8 s (ref 9.6–13.1)

## 2013-10-30 MED ORDER — CLOTRIMAZOLE-BETAMETHASONE 1-0.05 % EX CREA: TOPICAL_CREAM | CUTANEOUS | Status: DC

## 2013-10-30 MED ORDER — NYSTATIN 100000 UNIT/ML MT SUSP: 500000.0000 [IU] | Freq: Four times a day (QID) | OROMUCOSAL | Status: DC

## 2013-10-30 NOTE — Progress Notes (Signed)
Subjective:    Patient ID: Nathan Blackwell, male    DOB: December 24, 1920, 80 y.o.   MRN: [id number]  HPI  Here to f/u; overall doing ok,  Pt denies chest pain, increased sob or doe, wheezing, orthopnea, PND, increased LE swelling, palpitations, dizziness or syncope.  Pt denies polydipsia, polyuria, or low sugar symptoms such as weakness or confusion improved with po intake.  Pt denies new neurological symptoms such as new headache, or facial or extremity weakness or numbness.   Pt states overall good compliance with meds, has been trying to follow lower cholesterol, diabetic diet, with wt overall stable,  but little exercise however. Overall wt down 8 lbs, diligently checking every day.  Overall good compliance with treatment, and good medicine tolerability.  Does have loss of taste, and ? Red tongue after recent antibx.  No further worsening LE erythema/tender/heat, and LE swelling at least down 50% estimate. Also wife noted a small area of ringworm to medial mid left calf.  She is also concerned about the erythema left in the legs (stasis dermatitis) and thought it would go away as well.  She is not sure when his next nephrology appt is scheduled.Also with new onset minor ?) choke and cough with eating, no overt aspiration. No fever. Past Medical History  Diagnosis Date  . Diabetes mellitus type II   . Hyperlipidemia   . Hypertension   . Osteoporosis   . Prostatic hypertrophy     benign  . OSA (obstructive sleep apnea)   . Rotator cuff syndrome     chronic  . Elevated PSA   . Hypothyroidism   . Nephrolithiasis   . Thyroid nodule   . GERD (gastroesophageal reflux disease)   . Colitis, Clostridium difficile     presumed 11/08  . Dementia   . Anemia     NOS  . Vitamin B 12 deficiency   . BRADYCARDIA 06/10/2009  . COLON CANCER, HX OF 09/20/2006  . HYPOTHYROIDISM 01/14/2007  . DIABETES MELLITUS, TYPE II 09/20/2006  . HYPERLIPIDEMIA 09/20/2006  . ANEMIA-NOS 06/23/2008  . DEMENTIA 05/14/2007  .  DEPRESSION 02/27/2007  . SLEEP APNEA, OBSTRUCTIVE 01/14/2007  . HYPERTENSION 09/20/2006  . GERD 01/14/2007  . OSTEOPOROSIS 10/08/2006  . PERIPHERAL EDEMA 09/03/2007  . RENAL INSUFFICIENCY 04/01/2010  . B12 deficiency 05/25/2010  . CLOSTRIDIUM DIFFICILE COLITIS 01/30/2007  . THYROID NODULE 01/14/2007  . CONSTIPATION, RECURRENT 01/06/2010  . BENIGN PROSTATIC HYPERTROPHY 10/08/2006  . ARTHRITIS 02/23/2007  . BACK PAIN, THORACIC REGION 06/04/2008  . COLONIC POLYPS, HX OF 10/08/2006  . NEPHROLITHIASIS, HX OF 01/14/2007  . Cancer of colon dx'd 1998  . Cancer of vocal cord dx'd 1998  . 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 DVT of lower limb, acute 01/12/2011  . Sleep apnea     CPAP dependent   Past Surgical History  Procedure Laterality Date  . Cholecystectomy  1999  . Inguinal herniorrhapy  1984  . Appendectomy  1998  . Colon resection  1998  . Skin cancer extraction    . Cataract extraction, bilateral      reports that he has never smoked. He has never used smokeless tobacco. He reports that he does not drink alcohol or use illicit drugs. family history includes Diabetes in his mother; Stroke in his father. Allergies  Allergen Reactions  . Biaxin [Clarithromycin] Other (See Comments)    Unknown   . Other Other (See Comments)    Nephrologist has told patient not to eat many different  foods (tomatoes, Green Foods, etc).   . Sulfa Antibiotics Other (See Comments)    Unknown    Current Outpatient Prescriptions on File Prior to Visit  Medication Sig Dispense Refill  . amoxicillin (AMOXIL) 500 MG capsule Take 2 capsules (1,000 mg total) by mouth 2 (two) times daily.  40 capsule  0  . cholecalciferol (VITAMIN D) 1000 UNITS tablet Take 1,000 Units by mouth daily.      . citalopram (CELEXA) 20 MG tablet TAKE 1 TABLET BY MOUTH ONCE A DAY  30 tablet  11  . CREON 12000 UNITS CPEP capsule TAKE 1 TO 2 CAPSULES BY MOUTH THREE TIMES A DAY WITH EACH MEAL  180 each  3  . docusate sodium (COLACE) 100 MG  capsule Take 100 mg by mouth at bedtime.        . furosemide (LASIX) 80 MG tablet 1 and 1/2 tab by mouth twice per day  90 tablet  5  . hydrALAZINE (APRESOLINE) 25 MG tablet Take 12.5 mg by mouth 3 (three) times daily.      . isosorbide mononitrate (IMDUR) 30 MG 24 hr tablet Take 1 tablet (30 mg total) by mouth daily.  30 tablet  11  . JANUVIA 50 MG tablet TAKE 1 TABLET BY MOUTH DAILY  30 tablet  11  . lansoprazole (PREVACID) 30 MG capsule TAKE 1 CAPSULE BY MOUTH ONCE A DAY  30 capsule  11  . levothyroxine (SYNTHROID, LEVOTHROID) 100 MCG tablet Take 100 mcg by mouth daily before breakfast.      . Linaclotide (LINZESS) 290 MCG CAPS capsule Take 1 capsule (290 mcg total) by mouth daily as needed.  30 capsule  5  . OLANZapine (ZYPREXA) 5 MG tablet Take 5 mg by mouth daily.      . polyethylene glycol (MIRALAX / GLYCOLAX) packet Take 17 g by mouth 2 (two) times daily.  14 each  0  . QUEtiapine (SEROQUEL) 50 MG tablet Take 1 tablet (50 mg total) by mouth at bedtime.  90 tablet  3  . Rivaroxaban (XARELTO) 15 MG TABS tablet Take 1 tablet (15 mg total) by mouth 2 (two) times daily with a meal.  14 tablet  0  . tamsulosin (FLOMAX) 0.4 MG CAPS capsule Take 0.4 mg by mouth at bedtime.       No current facility-administered medications on file prior to visit.   Review of Systems  Constitutional: Negative for unusual diaphoresis or other sweats  HENT: Negative for ringing in ear Eyes: Negative for double vision or worsening visual disturbance.  Respiratory: Negative for choking and stridor.   Gastrointestinal: Negative for vomiting or other signifcant bowel change Genitourinary: Negative for hematuria or decreased urine volume.  Musculoskeletal: Negative for other MSK pain or swelling Skin: Negative for color change and worsening wound.  Neurological: Negative for tremors and numbness other than noted  Psychiatric/Behavioral: Negative for decreased concentration or agitation other than above         Objective:   Physical Exam BP 140/70  Pulse 66  Temp(Src) 98 F (36.7 C)  Wt 157 lb (71.215 kg)  SpO2 97% VS noted,  Constitutional: Pt appears well-developed, well-nourished.  HENT: Head: NCAT.  Right Ear: External ear normal.  Left Ear: External ear normal.  Tongue with prob red thrush vs glossitis Eyes: . Pupils are equal, round, and reactive to light. Conjunctivae and EOM are normal Neck: Normal range of motion. Neck supple.  Cardiovascular: Normal rate and regular rhythm.   Pulmonary/Chest: Effort  normal and breath sounds normal.  Abd:  Soft, NT, ND, + BS Neurological: Pt is alert. At baseline confused , motor grossly intact Skin: Skin is warm. Has bilat stasis dermatitis residual right > left ant lower legs but no evidence for more acute cellulitis, LE edema now at 1+ bilat only to knees; also with < 1/2 cm area nontender typical ringworm like lesion to left medial med calf Psychiatric: Pt behavior is normal. No agitation.     Assessment & Plan:

## 2013-10-30 NOTE — Assessment & Plan Note (Signed)
stable overall by history and exam, recent data reviewed with pt, and pt to continue medical treatment as before,  to f/u any worsening symptoms or concerns Lab Results  Component Value Date   HGBA1C 6.2 01/30/2012

## 2013-10-30 NOTE — Assessment & Plan Note (Signed)
Mild for now, doubt cva related, more likely dementia related, for speech path eval, needs diet recommendation, doubt needs GI eval for now, no evidence for asp pna

## 2013-10-30 NOTE — Assessment & Plan Note (Signed)
For lotrisone asd prn,  to f/u any worsening symptoms or concerns

## 2013-10-30 NOTE — Assessment & Plan Note (Signed)
To cont lasix 120 bid for now, goal dry wt approx 150, for labs today, also f/u renal

## 2013-10-30 NOTE — Assessment & Plan Note (Signed)
For nystatin s and s asd

## 2013-10-30 NOTE — Progress Notes (Signed)
Pre visit review using our clinic review tool, if applicable. No additional management support is needed unless otherwise documented below in the visit note. 

## 2013-10-30 NOTE — Patient Instructions (Addendum)
Please take all new medication as prescribed - the nystatin for the tongue (thrush), the lotrisone cream for the ringworm  Please continue all other medications as before, and refills have been done if requested.  Please have the pharmacy call with any other refills you may need.  Pleaese call if your weight gets to 150, and the leg swelling is improved, if you have not been able to see the kidney doctor yet  Please keep your appointments with your specialists as you may have planned - please call your Kidney doctor about when your next appt is scheduled  You will be contacted regarding the referral for: Modified Barium Swallow and Speech pathology evaluation to evalaute for aspiration due the choking with eating  Please go to the LAB in the Basement (turn left off the elevator) for the tests to be done today  You will be contacted by phone if any changes need to be made immediately.  Otherwise, you will receive a letter about your results with an explanation, but please check with MyChart first.  Please remember to sign up for MyChart if you have not done so, as this will be important to you in the future with finding out test results, communicating by private email, and scheduling acute appointments online when needed.  Please return in 6 months, or sooner if needed

## 2013-11-01 ENCOUNTER — Other Ambulatory Visit: Payer: Self-pay | Admitting: Internal Medicine

## 2013-11-07 ENCOUNTER — Telehealth: Payer: Self-pay | Admitting: Internal Medicine

## 2013-11-07 NOTE — Telephone Encounter (Signed)
Ok to leave Modified Barium Swallow and Speech pathology evaluation , appt information vm if needed.  Not on 11/11/13, 11/12/13. 11/18/13 in the morning would be the best appointment time.

## 2013-11-11 DIAGNOSIS — E119 Type 2 diabetes mellitus without complications: Secondary | ICD-10-CM | POA: Diagnosis not present

## 2013-11-11 DIAGNOSIS — L608 Other nail disorders: Secondary | ICD-10-CM | POA: Diagnosis not present

## 2013-11-11 NOTE — Telephone Encounter (Signed)
appt 11/17/13  @ 11am / 1045am arrival at [facility]. Pt wife aware.

## 2013-11-12 ENCOUNTER — Encounter (HOSPITAL_COMMUNITY)
Admission: RE | Admit: 2013-11-12 | Discharge: 2013-11-12 | Disposition: A | Discharge status: Home / Self Care | Payer: Medicare Other | Source: Ambulatory Visit | Attending: Nephrology | Admitting: Nephrology

## 2013-11-12 ENCOUNTER — Other Ambulatory Visit (HOSPITAL_COMMUNITY): Payer: Self-pay | Admitting: Internal Medicine

## 2013-11-12 DIAGNOSIS — I129 Hypertensive chronic kidney disease with stage 1 through stage 4 chronic kidney disease, or unspecified chronic kidney disease: Secondary | ICD-10-CM | POA: Insufficient documentation

## 2013-11-12 DIAGNOSIS — D649 Anemia, unspecified: Secondary | ICD-10-CM | POA: Insufficient documentation

## 2013-11-12 DIAGNOSIS — N184 Chronic kidney disease, stage 4 (severe): Secondary | ICD-10-CM | POA: Insufficient documentation

## 2013-11-12 DIAGNOSIS — R1310 Dysphagia, unspecified: Secondary | ICD-10-CM

## 2013-11-12 LAB — POCT HEMOGLOBIN-HEMACUE: Hemoglobin: 11.9 g/dL — ABNORMAL LOW (ref 13.0–17.0)

## 2013-11-12 MED ORDER — EPOETIN ALFA 10000 UNIT/ML IJ SOLN: 10000.0000 [IU] | INTRAMUSCULAR | Status: DC

## 2013-11-13 ENCOUNTER — Ambulatory Visit (INDEPENDENT_AMBULATORY_CARE_PROVIDER_SITE_OTHER): Payer: Medicare Other | Admitting: Cardiology

## 2013-11-13 VITALS — BP 174/68 | HR 52 | Ht 69.0 in | Wt 157.8 lb

## 2013-11-13 DIAGNOSIS — I82402 Acute embolism and thrombosis of unspecified deep veins of left lower extremity: Secondary | ICD-10-CM

## 2013-11-13 DIAGNOSIS — I82409 Acute embolism and thrombosis of unspecified deep veins of unspecified lower extremity: Secondary | ICD-10-CM

## 2013-11-13 DIAGNOSIS — I10 Essential (primary) hypertension: Secondary | ICD-10-CM

## 2013-11-13 DIAGNOSIS — R296 Repeated falls: Secondary | ICD-10-CM

## 2013-11-13 DIAGNOSIS — N183 Chronic kidney disease, stage 3 unspecified: Secondary | ICD-10-CM

## 2013-11-13 DIAGNOSIS — R609 Edema, unspecified: Secondary | ICD-10-CM | POA: Diagnosis not present

## 2013-11-13 DIAGNOSIS — R079 Chest pain, unspecified: Secondary | ICD-10-CM

## 2013-11-13 DIAGNOSIS — I951 Orthostatic hypotension: Secondary | ICD-10-CM

## 2013-11-13 DIAGNOSIS — E119 Type 2 diabetes mellitus without complications: Secondary | ICD-10-CM

## 2013-11-13 DIAGNOSIS — Z9181 History of falling: Secondary | ICD-10-CM

## 2013-11-13 DIAGNOSIS — Z7901 Long term (current) use of anticoagulants: Secondary | ICD-10-CM | POA: Diagnosis not present

## 2013-11-13 DIAGNOSIS — I498 Other specified cardiac arrhythmias: Secondary | ICD-10-CM

## 2013-11-13 DIAGNOSIS — E1129 Type 2 diabetes mellitus with other diabetic kidney complication: Secondary | ICD-10-CM

## 2013-11-13 DIAGNOSIS — Z9989 Dependence on other enabling machines and devices: Secondary | ICD-10-CM

## 2013-11-13 DIAGNOSIS — E785 Hyperlipidemia, unspecified: Secondary | ICD-10-CM

## 2013-11-13 DIAGNOSIS — E1122 Type 2 diabetes mellitus with diabetic chronic kidney disease: Secondary | ICD-10-CM

## 2013-11-13 DIAGNOSIS — E039 Hypothyroidism, unspecified: Secondary | ICD-10-CM

## 2013-11-13 DIAGNOSIS — G4733 Obstructive sleep apnea (adult) (pediatric): Secondary | ICD-10-CM

## 2013-11-13 NOTE — Patient Instructions (Addendum)
Decrease HYDRALAZINE 25 MG  TAKE 1 TABLET TWICE A DAY   Your physician wants you to follow-up in [place]. [place] will receive a reminder letter in the mail two months in advance. If you don't receive a letter, please call our office to schedule the follow-up appointment.

## 2013-11-14 DIAGNOSIS — N2581 Secondary hyperparathyroidism of renal origin: Secondary | ICD-10-CM | POA: Diagnosis not present

## 2013-11-14 DIAGNOSIS — D631 Anemia in chronic kidney disease: Secondary | ICD-10-CM | POA: Diagnosis not present

## 2013-11-14 DIAGNOSIS — N184 Chronic kidney disease, stage 4 (severe): Secondary | ICD-10-CM | POA: Diagnosis not present

## 2013-11-16 ENCOUNTER — Encounter: Payer: Self-pay | Admitting: Cardiology

## 2013-11-16 MED ORDER — HYDRALAZINE HCL 25 MG PO TABS: 25.0000 mg | ORAL_TABLET | Freq: Two times a day (BID) | ORAL | Status: DC

## 2013-11-16 NOTE — Assessment & Plan Note (Addendum)
Recent Dopplers did not show any acute findings. There was some mild layering chronic findings but no significant concerning symptoms.  He continues to be on Xarelto - with his recent falls, I don't know that DVT PE is as much a risk as the possible GI bleeding or head bleed from a fall. I will defer this to his primary provider

## 2013-11-16 NOTE — Assessment & Plan Note (Signed)
He is not on any kind of AV nodal agents. This is probably just conduction disease.  We need to watch QT interval with Seroquel, but this seems to be normal. Monitor for signs of protuberant incontinence or symptomatic bradycardia. For now is not significantly symptomatic.

## 2013-11-16 NOTE — Progress Notes (Signed)
PCP: [name], MD  Clinic Note: Chief Complaint  Patient presents with  . Follow-up    3 months follow-up, pt c/o swelling in feet and ankles, denied chest pain and tightness    HPI: Nathan Blackwell is a 80 y.o. male with a problem list below who presents today for followup after initial consultation in June for exertional chest pain the knee also noted mild ankle edema. He had been noticing exertional chest discomfort when trying to do rehabilitation work walking with a cane.  1. Exertional chest pain   2. Deep venous thrombosis, left - mild residual, layerd thrombus noted on dopplers 09/2013.   3. Anticoagulation monitoring, INR range 2-3   4. PERIPHERAL EDEMA   5. BRADYCARDIA   6. HYPERLIPIDEMIA   7. HYPERTENSION   8. Orthostatic hypotension   9. DIABETES MELLITUS, TYPE II   10. Stage 3 chronic renal impairment associated with type 2 diabetes mellitus   11. OSA on CPAP   12. HYPOTHYROIDISM   13. Recurrent falls    Interval History: His chest pain was evaluated with a Myoview stress test that showed mild diaphragmatic attenuation but no ischemia. It did show rate related left bundle branch block. Personally reviewed the EKG and image findings. Actually, since his last visit he has not had anymore chest pain. His swelling is present but not overly worrisome to him. He is somewhat debilitated and therefore wrote a little short of breath with walking but has not noted significant exertional dyspnea or chest pain. No PND, orthopnea to suggest his edema is related to cardiac etiology.  Other Cardiac Review of Systems As Follows: No palpitations, lightheadedness, dizziness, weakness or syncope/near syncope. No claudication.  ROS: A comprehensive Review of Systems - was performed Review of Systems  Constitutional: Positive for weight loss.       10 lb in 1 month; has noted dysphagia per PCP.  HENT: Negative for nosebleeds.   Eyes: Negative for blurred vision and double vision.    Respiratory: Negative for cough, sputum production and wheezing.   Cardiovascular: Positive for leg swelling. Negative for PND.       Per history of present illness  Gastrointestinal: Positive for constipation. Negative for blood in stool and melena.       Did not note dysphagia  Genitourinary: Negative for hematuria.       Nocturia  Musculoskeletal: Positive for back pain and joint pain.       Chronic back & hip pain  Neurological: Positive for dizziness. Negative for sensory change, speech change, focal weakness, seizures, loss of consciousness and headaches.       Occasional positional dizziness & unsteady gait. No further falls since last visit  Endo/Heme/Allergies: Negative.        B12 shots  Psychiatric/Behavioral: Negative.  Negative for depression and hallucinations. The patient is not nervous/anxious.   All other systems reviewed and are negative.  MEDICATIONS AND ALLERGIES REVIEWED IN EPIC -- no change SOCIAL AND FAMILY HISTORY REVIEWED IN EPIC -- no change PSH REVIEWED IN EPIC Cardiac Evaluation  Procedure Laterality Date  . Nm lexiscan myoview ltd  09/02/2013    Normal LV function and wall motion. EF 62%; rate dependent LBBB with Lexiscan, Low Risk fixed inferior defect suggestive of diaphragmatic attenuation and not infarct with normal wall motion.  . Transthoracic echocardiogram  08/03/2009    Normal LV size. Mild LVH. EF 60-65%. Gr 2 DD, mild MR.   Venous Dopplers with mild residual left femoral  vein thrombus  Wt Readings from Last 3 Encounters:  11/13/13 157 lb 12.8 oz (71.578 kg)  10/30/13 157 lb (71.215 kg)  10/09/13 167 lb 12.8 oz (76.114 kg)   PHYSICAL EXAM BP 174/68  Pulse 52  Ht 5\' 9"  (1.753 m)  Wt 157 lb 12.8 oz (71.578 kg)  BMI 23.29 kg/m2 General appearance: alert, appears stated age, no distress and Thin but well-nourished. Very hard of hearing, uses a high frequency amplifier hearing aid; poor historian  Neck: no adenopathy, no carotid bruit, no  JVD and supple, symmetrical, trachea midline  Lungs: clear to auscultation bilaterally, normal percussion bilaterally and Nonlabored, good air movement  Heart: RRR, normal S1-S2, no M./R./G.  Abdomen: soft, non-tender; bowel sounds normal; no masses, no organomegaly  Extremities: No clubbing or cyanosis with ~1-2+ bilateral edema & Mild varicosities  Pulses: 2+ and symmetric  Skin: Skin color, texture, turgor normal. No rashes or lesions or But thin  Neurologic: Mental status: alertness: alert, orientation: person, place, city, affect: blunted  Cranial nerves: normal with the exception of hearing (pupils with post Cataract Sgx irregularities)   Adult ECG Report - not checked.  ASSESSMENT / PLAN: Exertional chest pain Atypical symptoms when he had been dissection of the fact that was exertional. Probably musculoskeletal. Relatively relieved by a negative stress test. Of course given his age is a possibility of false negative findings. Thankfully he is not really noticing these symptoms anymore. At this point I think we can probably stop Imdur, nasal continuous since starting it coincided with his lack of symptoms. At this point I would not undergo any further evaluation unless symptoms recur. I would really be reluctant to consider invasive evaluation without extreme symptoms.  H/O Deep venous thrombosis - left femoral vein Recent Dopplers did not show any acute findings. There was some mild layering chronic findings but no significant concerning symptoms.  He continues to be on Xarelto - with his recent falls, I don't know that DVT PE is as much a risk as the possible GI bleeding or head bleed from a fall. I will defer this to his primary provider  BRADYCARDIA He is not on any kind of AV nodal agents. This is probably just conduction disease.  We need to watch QT interval with Seroquel, but this seems to be normal. Monitor for signs of protuberant incontinence or symptomatic bradycardia. For  now is not significantly symptomatic.  PERIPHERAL EDEMA His Lasix dose was increased to 120 mg twice a day at last PCP visit. I tend to think that compression stockings would probably help and recommended the use of mild to moderate weight compression stockings.  HYPERLIPIDEMIA Monitored by PCP. By last check it seemed relatively well-controlled gout medications.  HYPERTENSION The blood pressure is pretty high today. He does have problems with orthostatic hypotension. Is currently taking 12.5 mg 3 times a day of hydralazine. I would simply change this to 25 twice a day. Target blood pressure should be somewhere between 130 to 160 mmHg. This has been his usual blood pressure ranged, so today's finding is mostly in outlier.  DIABETES MELLITUS, TYPE II Monitored by PCP    No orders of the defined types were placed in this encounter.   Meds ordered this encounter  Medications  . hydrALAZINE (APRESOLINE) 25 MG tablet    Sig: Take 1 tablet (25 mg total) by mouth 2 (two) times daily.    Dispense:  180 tablet    Refill:  3    Place on  file    Followup: 6 months. If he continues to be stable at that time with no further chest pain, we will either change to one year visits or return to the care of his primary provider   Leonie Man, M.D., M.S. Interventional Cardiologist   Pager # [phone]

## 2013-11-16 NOTE — Assessment & Plan Note (Signed)
Monitored by PCP. By last check it seemed relatively well-controlled gout medications.

## 2013-11-16 NOTE — Assessment & Plan Note (Signed)
Monitored by PCP

## 2013-11-16 NOTE — Assessment & Plan Note (Signed)
His Lasix dose was increased to 120 mg twice a day at last PCP visit. I tend to think that compression stockings would probably help and recommended the use of mild to moderate weight compression stockings.

## 2013-11-16 NOTE — Assessment & Plan Note (Signed)
The blood pressure is pretty high today. He does have problems with orthostatic hypotension. Is currently taking 12.5 mg 3 times a day of hydralazine. I would simply change this to 25 twice a day. Target blood pressure should be somewhere between 130 to 160 mmHg. This has been his usual blood pressure ranged, so today's finding is mostly in outlier.

## 2013-11-16 NOTE — Assessment & Plan Note (Signed)
Atypical symptoms when he had been dissection of the fact that was exertional. Probably musculoskeletal. Relatively relieved by a negative stress test. Of course given his age is a possibility of false negative findings. Thankfully he is not really noticing these symptoms anymore. At this point I think we can probably stop Imdur, nasal continuous since starting it coincided with his lack of symptoms. At this point I would not undergo any further evaluation unless symptoms recur. I would really be reluctant to consider invasive evaluation without extreme symptoms.

## 2013-11-17 ENCOUNTER — Ambulatory Visit (HOSPITAL_COMMUNITY)
Admission: RE | Admit: 2013-11-17 | Discharge: 2013-11-17 | Disposition: A | Discharge status: Home / Self Care | Payer: Medicare Other | Source: Ambulatory Visit | Attending: Internal Medicine | Admitting: Internal Medicine

## 2013-11-17 DIAGNOSIS — K219 Gastro-esophageal reflux disease without esophagitis: Secondary | ICD-10-CM | POA: Insufficient documentation

## 2013-11-17 DIAGNOSIS — Z923 Personal history of irradiation: Secondary | ICD-10-CM | POA: Diagnosis not present

## 2013-11-17 DIAGNOSIS — E119 Type 2 diabetes mellitus without complications: Secondary | ICD-10-CM | POA: Insufficient documentation

## 2013-11-17 DIAGNOSIS — N400 Enlarged prostate without lower urinary tract symptoms: Secondary | ICD-10-CM | POA: Insufficient documentation

## 2013-11-17 DIAGNOSIS — E785 Hyperlipidemia, unspecified: Secondary | ICD-10-CM | POA: Insufficient documentation

## 2013-11-17 DIAGNOSIS — M810 Age-related osteoporosis without current pathological fracture: Secondary | ICD-10-CM | POA: Insufficient documentation

## 2013-11-17 DIAGNOSIS — Z8521 Personal history of malignant neoplasm of larynx: Secondary | ICD-10-CM | POA: Diagnosis not present

## 2013-11-17 DIAGNOSIS — G4733 Obstructive sleep apnea (adult) (pediatric): Secondary | ICD-10-CM | POA: Insufficient documentation

## 2013-11-17 DIAGNOSIS — I10 Essential (primary) hypertension: Secondary | ICD-10-CM | POA: Diagnosis not present

## 2013-11-17 DIAGNOSIS — E039 Hypothyroidism, unspecified: Secondary | ICD-10-CM | POA: Insufficient documentation

## 2013-11-17 DIAGNOSIS — D649 Anemia, unspecified: Secondary | ICD-10-CM | POA: Insufficient documentation

## 2013-11-17 DIAGNOSIS — F0390 Unspecified dementia without behavioral disturbance: Secondary | ICD-10-CM | POA: Insufficient documentation

## 2013-11-17 DIAGNOSIS — R1310 Dysphagia, unspecified: Secondary | ICD-10-CM | POA: Insufficient documentation

## 2013-11-17 NOTE — Procedures (Signed)
Objective Swallowing Evaluation: Modified Barium Swallowing Study  Patient Details  Name: Nathan Blackwell MRN: [id number] Date of Birth: 08-Jun-1920  Today's Date: 11/17/2013 Time: 4627-0350 SLP Time Calculation (min): 30 min  Past Medical History:  Past Medical History  Diagnosis Date  . Diabetes mellitus type II   . Hyperlipidemia   . Hypertension   . Osteoporosis   . Prostatic hypertrophy     benign  . OSA (obstructive sleep apnea)   . Rotator cuff syndrome     chronic  . Elevated PSA   . Hypothyroidism   . Nephrolithiasis   . Thyroid nodule   . GERD (gastroesophageal reflux disease)   . Colitis, Clostridium difficile     presumed 11/08  . Dementia   . Anemia     NOS  . Vitamin B 12 deficiency   . BRADYCARDIA 06/10/2009  . COLON CANCER, HX OF 09/20/2006  . HYPOTHYROIDISM 01/14/2007  . DIABETES MELLITUS, TYPE II 09/20/2006  . HYPERLIPIDEMIA 09/20/2006  . ANEMIA-NOS 06/23/2008  . DEMENTIA 05/14/2007  . DEPRESSION 02/27/2007  . SLEEP APNEA, OBSTRUCTIVE 01/14/2007  . HYPERTENSION 09/20/2006  . GERD 01/14/2007  . OSTEOPOROSIS 10/08/2006  . PERIPHERAL EDEMA 09/03/2007  . RENAL INSUFFICIENCY 04/01/2010  . B12 deficiency 05/25/2010  . CLOSTRIDIUM DIFFICILE COLITIS 01/30/2007  . THYROID NODULE 01/14/2007  . CONSTIPATION, RECURRENT 01/06/2010  . BENIGN PROSTATIC HYPERTROPHY 10/08/2006  . ARTHRITIS 02/23/2007  . BACK PAIN, THORACIC REGION 06/04/2008  . COLONIC POLYPS, HX OF 10/08/2006  . NEPHROLITHIASIS, HX OF 01/14/2007  . Cancer of colon dx'd 1998  . Cancer of vocal cord dx'd 1998  . 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 DVT of lower limb, acute 01/12/2011  . Sleep apnea     CPAP dependent   Past Surgical History:  Past Surgical History  Procedure Laterality Date  . Cholecystectomy  1999  . Inguinal herniorrhapy  1984  . Appendectomy  1998  . Colon resection  1998  . Skin cancer extraction    . Cataract extraction, bilateral    . Nm lexiscan myoview ltd  09/02/2013    Normal  LV function and wall motion. EF 62%; rate dependent LBBB with Lexiscan, Low Risk fixed inferior defect suggestive of diaphragmatic attenuation and not infarct with normal wall motion.  . Transthoracic echocardiogram  08/03/2009    Normal LV size. Mild LVH. EF 60-65%. Gr 2 DD, mild MR.   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      Assessment / Plan / Recommendation Clinical Impression  Dysphagia Diagnosis: Suspected primary esophageal dysphagia;Mild pharyngeal phase dysphagia Clinical impression: Patient presents with a primary esophageal dysphagia with a secondary pharyngeal component, incidentally found during todays study. C/o globus coincided with pill remaining at C5-6 post swallow due to MD reported esophageal web. Otherwise, patient presents with functional pharyngeal and esophageal clearance of provided liquids and solids. Trace but silent penetration of thin liquids noted consistently. SLP provided cueing for chin tuck which did not decrease episodes however cued throat clear helpful at aiding in clearance. At this time, recommend continuation of current diet including soft, moistened solids, and thin liquids as patient appears to be managing from a pulmonary standpoint. Compensatory strategies for both airway protection and esophageal clearance provided to patient and wife.     Treatment Recommendation  No treatment  recommended at this time    Diet Recommendation Regular;Thin liquid (moistened solids)   Liquid Administration via: Cup;No straw Medication Administration: Crushed with puree (crush large pills) Supervision: Patient able to self feed Compensations: Slow rate;Small sips/bites;Follow solids with liquid;Clear throat intermittently Postural Changes and/or Swallow Maneuvers: Seated upright  90 degrees;Upright 30-60 min after meal    Other  Recommendations Oral Care Recommendations: Oral care BID   Follow Up Recommendations  None               General HPI: 80 year old male with PMH of arthritis, GERD, 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  Type of Study: Modified Barium Swallowing Study Reason for Referral: Objectively evaluate swallowing function Previous Swallow Assessment: none reported Diet Prior to this Study: Regular;Thin liquids Temperature Spikes Noted: No Respiratory Status: Room air History of Recent Intubation: No Behavior/Cognition: Alert;Cooperative;Pleasant mood Oral Cavity - Dentition: Adequate natural dentition Oral Motor / Sensory Function: Within functional limits Self-Feeding Abilities: Able to feed self Patient Positioning: Upright in chair Baseline Vocal Quality: Hoarse Volitional Cough: Strong Volitional Swallow: Able to elicit Anatomy: Within functional limits Pharyngeal Secretions: Not observed secondary MBS    Reason for Referral Objectively evaluate swallowing function   Oral Phase Oral Preparation/Oral Phase Oral Phase: WFL   Pharyngeal Phase Pharyngeal Phase Pharyngeal Phase: Impaired Pharyngeal - Nectar Pharyngeal - Nectar Cup: Delayed swallow initiation;Premature spillage to valleculae Pharyngeal - Thin Pharyngeal - Thin Cup: Delayed swallow initiation;Premature spillage to pyriform sinuses;Penetration/Aspiration before swallow Penetration/Aspiration details (thin cup): Material enters airway, CONTACTS cords then ejected out;Material enters airway, remains ABOVE vocal cords and not ejected out Pharyngeal - Solids Pharyngeal - Puree: Delayed swallow initiation;Premature spillage to valleculae Pharyngeal - Mechanical Soft: Delayed swallow initiation;Premature spillage to valleculae Pharyngeal -  Pill: Within functional limits (whole in puree)  Cervical Esophageal Phase    GO    Cervical Esophageal Phase Cervical Esophageal Phase: Impaired (pill remained at C5-6 post swallow)    Functional Assessment Tool Used: skilled clinical judgement Functional Limitations: Swallowing Swallow Current Status (Q1194): At least 1 percent but less than 20 percent impaired, limited or restricted Swallow Goal Status [phone]): At least 1 percent but less than 20 percent impaired, limited or restricted Swallow Discharge Status [phone]): At least 1 percent but less than 20 percent impaired, limited or restricted    Nathan Blackwell 11/17/2013, 2:19 PM

## 2013-11-18 DIAGNOSIS — I10 Essential (primary) hypertension: Secondary | ICD-10-CM | POA: Diagnosis not present

## 2013-11-18 DIAGNOSIS — D631 Anemia in chronic kidney disease: Secondary | ICD-10-CM | POA: Diagnosis not present

## 2013-11-18 DIAGNOSIS — N2581 Secondary hyperparathyroidism of renal origin: Secondary | ICD-10-CM | POA: Diagnosis not present

## 2013-11-18 DIAGNOSIS — N183 Chronic kidney disease, stage 3 unspecified: Secondary | ICD-10-CM | POA: Diagnosis not present

## 2013-11-19 ENCOUNTER — Other Ambulatory Visit: Payer: Self-pay | Admitting: Internal Medicine

## 2013-11-21 ENCOUNTER — Other Ambulatory Visit: Payer: Self-pay | Admitting: Internal Medicine

## 2013-11-21 DIAGNOSIS — Q394 Esophageal web: Secondary | ICD-10-CM

## 2013-11-21 DIAGNOSIS — R1319 Other dysphagia: Secondary | ICD-10-CM

## 2013-11-25 ENCOUNTER — Encounter (HOSPITAL_COMMUNITY)
Admission: RE | Admit: 2013-11-25 | Discharge: 2013-11-25 | Disposition: A | Discharge status: Home / Self Care | Payer: Medicare Other | Source: Ambulatory Visit | Attending: Nephrology | Admitting: Nephrology

## 2013-11-25 DIAGNOSIS — D649 Anemia, unspecified: Secondary | ICD-10-CM | POA: Diagnosis not present

## 2013-11-25 LAB — POCT HEMOGLOBIN-HEMACUE: Hemoglobin: 11.1 g/dL — ABNORMAL LOW (ref 13.0–17.0)

## 2013-11-25 MED ORDER — EPOETIN ALFA 10000 UNIT/ML IJ SOLN
10000.0000 [IU] | INTRAMUSCULAR | Status: DC
  Administered 2013-11-25: 10000 [IU] via SUBCUTANEOUS

## 2013-11-25 MED ORDER — EPOETIN ALFA 10000 UNIT/ML IJ SOLN
INTRAMUSCULAR | Status: AC
  Filled 2013-11-25: qty 1

## 2013-12-18 ENCOUNTER — Encounter (HOSPITAL_COMMUNITY): Payer: Self-pay | Admitting: *Deleted

## 2013-12-18 ENCOUNTER — Encounter: Payer: Self-pay | Admitting: Gastroenterology

## 2013-12-18 ENCOUNTER — Ambulatory Visit (INDEPENDENT_AMBULATORY_CARE_PROVIDER_SITE_OTHER): Payer: Medicare Other | Admitting: Gastroenterology

## 2013-12-18 VITALS — BP 140/62 | HR 60 | Ht 68.5 in | Wt 162.5 lb

## 2013-12-18 DIAGNOSIS — R1314 Dysphagia, pharyngoesophageal phase: Secondary | ICD-10-CM | POA: Diagnosis not present

## 2013-12-18 DIAGNOSIS — Q394 Esophageal web: Secondary | ICD-10-CM | POA: Diagnosis not present

## 2013-12-18 NOTE — Patient Instructions (Signed)
You have been scheduled for an endoscopy at [facility] with Dr. Deatra Ina. Please follow written instructions given to you at your visit today. If you use inhalers (even only as needed), please bring them with you on the day of your procedure.

## 2013-12-18 NOTE — Progress Notes (Signed)
     12/18/2013 Nathan Blackwell [id number] 1920/10/06   History of Present Illness:  This is a 80 year old male who is previously known to Dr. Deatra Ina. He presents to our office today with his wife for complaints of dysphagia. He states that when he eats he starts to cough until his face turns bright red. Also pills get stuck in his throat. He had a modified barium swallowing study on September 14 that showed suspected primary esophageal dysphagia and mild pharyngeal phase dysphagia. It also showed a repeated flash penetration with thin barium but no aspiration. There is mildly delayed oral transit with all consistencies. It also showed a proximal cervical esophageal web preventing the passage of a 13 mm barium tablet. He says that his symptoms have been present for quite some time now. He has actually gained some weight since his last visit here in May.  He does have quite an extensive past medical history including diabetes mellitus, hypertension, hyperlipidemia, obstructive sleep apnea, hypothyroidism, mild dementia, history of colon cancer in 1998, history of vocal cord cancer in 1998 for which he received radiation treatment, and history of DVT in November 2012 for which he previously was on blood thinners but those have been discontinued due to his risk for fall.   Current Medications, Allergies, Past Medical History, Past Surgical History, Family History and Social History were reviewed in [employer] record.   Physical Exam: BP 140/62  Pulse 60  Ht 5' 8.5" (1.74 m)  Wt 162 lb 8 oz (73.71 kg)  BMI 24.35 kg/m2 General: Well developed white male in no acute distress Head: Normocephalic and atraumatic Eyes:  Sclerae anicteric, conjunctiva pink  Ears: Normal auditory acuity Lungs: Clear throughout to auscultation Heart: Regular rate and rhythm Abdomen: Soft, non-distended.  Normal bowel sounds.  Non-tender. Musculoskeletal: Symmetrical with no gross deformities    Extremities: No edema  Neurological: Alert oriented x 4, grossly non-focal Psychological:  Alert and cooperative. Normal mood and affect  Assessment and Recommendations: -Dysphagia:  Likely multifactorial, but he does have evidence of a cervical esophageal web on MBSS.  Will schedule for EGD with Savary dilation with fluoroscopy with Dr. Deatra Ina at [facility].  The risks, benefits, and alternatives were discussed with the patient and he consents to proceed.

## 2013-12-19 ENCOUNTER — Ambulatory Visit (HOSPITAL_COMMUNITY): Payer: Medicare Other | Admitting: Anesthesiology

## 2013-12-19 ENCOUNTER — Ambulatory Visit (HOSPITAL_COMMUNITY): Payer: Medicare Other

## 2013-12-19 ENCOUNTER — Encounter (HOSPITAL_COMMUNITY): Payer: Medicare Other | Admitting: Anesthesiology

## 2013-12-19 ENCOUNTER — Ambulatory Visit (HOSPITAL_COMMUNITY)
Admission: RE | Admit: 2013-12-19 | Discharge: 2013-12-19 | Disposition: A | Discharge status: Home / Self Care | Payer: Medicare Other | Source: Ambulatory Visit | Attending: Gastroenterology | Admitting: Gastroenterology

## 2013-12-19 ENCOUNTER — Encounter (HOSPITAL_COMMUNITY)
Admission: RE | Disposition: A | Discharge status: Home / Self Care | Payer: Self-pay | Source: Ambulatory Visit | Attending: Gastroenterology

## 2013-12-19 ENCOUNTER — Encounter (HOSPITAL_COMMUNITY): Payer: Self-pay | Admitting: Anesthesiology

## 2013-12-19 DIAGNOSIS — Z86718 Personal history of other venous thrombosis and embolism: Secondary | ICD-10-CM | POA: Diagnosis not present

## 2013-12-19 DIAGNOSIS — R1310 Dysphagia, unspecified: Secondary | ICD-10-CM | POA: Diagnosis not present

## 2013-12-19 DIAGNOSIS — F0390 Unspecified dementia without behavioral disturbance: Secondary | ICD-10-CM | POA: Diagnosis not present

## 2013-12-19 DIAGNOSIS — E039 Hypothyroidism, unspecified: Secondary | ICD-10-CM | POA: Diagnosis not present

## 2013-12-19 DIAGNOSIS — R1314 Dysphagia, pharyngoesophageal phase: Secondary | ICD-10-CM

## 2013-12-19 DIAGNOSIS — E119 Type 2 diabetes mellitus without complications: Secondary | ICD-10-CM | POA: Insufficient documentation

## 2013-12-19 DIAGNOSIS — Z85038 Personal history of other malignant neoplasm of large intestine: Secondary | ICD-10-CM | POA: Diagnosis not present

## 2013-12-19 DIAGNOSIS — E785 Hyperlipidemia, unspecified: Secondary | ICD-10-CM | POA: Diagnosis not present

## 2013-12-19 DIAGNOSIS — K222 Esophageal obstruction: Secondary | ICD-10-CM | POA: Insufficient documentation

## 2013-12-19 DIAGNOSIS — Q394 Esophageal web: Secondary | ICD-10-CM

## 2013-12-19 DIAGNOSIS — G4733 Obstructive sleep apnea (adult) (pediatric): Secondary | ICD-10-CM | POA: Diagnosis not present

## 2013-12-19 DIAGNOSIS — K449 Diaphragmatic hernia without obstruction or gangrene: Secondary | ICD-10-CM | POA: Insufficient documentation

## 2013-12-19 DIAGNOSIS — I10 Essential (primary) hypertension: Secondary | ICD-10-CM | POA: Diagnosis not present

## 2013-12-19 HISTORY — PX: SAVORY DILATION: SHX5439

## 2013-12-19 HISTORY — PX: ESOPHAGOGASTRODUODENOSCOPY (EGD) WITH PROPOFOL: SHX5813

## 2013-12-19 LAB — GLUCOSE, CAPILLARY: Glucose-Capillary: 118 mg/dL — ABNORMAL HIGH (ref 70–99)

## 2013-12-19 SURGERY — ESOPHAGOGASTRODUODENOSCOPY (EGD) WITH PROPOFOL
Anesthesia: Monitor Anesthesia Care

## 2013-12-19 MED ORDER — SODIUM CHLORIDE 0.9 % IV SOLN: INTRAVENOUS | Status: DC

## 2013-12-19 MED ORDER — LIDOCAINE HCL (CARDIAC) 20 MG/ML IV SOLN
INTRAVENOUS | Status: AC
  Filled 2013-12-19: qty 5

## 2013-12-19 MED ORDER — LACTATED RINGERS IV SOLN
INTRAVENOUS | Status: DC
  Administered 2013-12-19: 1000 mL via INTRAVENOUS

## 2013-12-19 MED ORDER — LACTATED RINGERS IV SOLN
INTRAVENOUS | Status: DC | PRN
  Administered 2013-12-19: 11:00:00 via INTRAVENOUS

## 2013-12-19 MED ORDER — PROPOFOL 10 MG/ML IV EMUL
INTRAVENOUS | Status: DC | PRN
  Administered 2013-12-19: 40 mg via INTRAVENOUS
  Administered 2013-12-19: 20 mg via INTRAVENOUS
  Administered 2013-12-19: 60 mg via INTRAVENOUS
  Administered 2013-12-19: 40 mg via INTRAVENOUS
  Administered 2013-12-19: 50 mg via INTRAVENOUS

## 2013-12-19 MED ORDER — PROPOFOL 10 MG/ML IV BOLUS
INTRAVENOUS | Status: AC
  Filled 2013-12-19: qty 20

## 2013-12-19 MED ORDER — LIDOCAINE HCL (CARDIAC) 20 MG/ML IV SOLN
INTRAVENOUS | Status: DC | PRN
  Administered 2013-12-19: 80 mg via INTRAVENOUS

## 2013-12-19 MED ORDER — BUTAMBEN-TETRACAINE-BENZOCAINE 2-2-14 % EX AERO
INHALATION_SPRAY | CUTANEOUS | Status: DC | PRN
  Administered 2013-12-19: 2 via TOPICAL

## 2013-12-19 SURGICAL SUPPLY — 15 items

## 2013-12-19 NOTE — H&P (View-Only) (Signed)
     12/18/2013 Nathan Blackwell [id number] 1920-06-25   History of Present Illness:  This is a 80 year old male who is previously known to Dr. Deatra Ina. He presents to our office today with his wife for complaints of dysphagia. He states that when he eats he starts to cough until his face turns bright red. Also pills get stuck in his throat. He had a modified barium swallowing study on September 14 that showed suspected primary esophageal dysphagia and mild pharyngeal phase dysphagia. It also showed a repeated flash penetration with thin barium but no aspiration. There is mildly delayed oral transit with all consistencies. It also showed a proximal cervical esophageal web preventing the passage of a 13 mm barium tablet. He says that his symptoms have been present for quite some time now. He has actually gained some weight since his last visit here in May.  He does have quite an extensive past medical history including diabetes mellitus, hypertension, hyperlipidemia, obstructive sleep apnea, hypothyroidism, mild dementia, history of colon cancer in 1998, history of vocal cord cancer in 1998 for which he received radiation treatment, and history of DVT in November 2012 for which he previously was on blood thinners but those have been discontinued due to his risk for fall.   Current Medications, Allergies, Past Medical History, Past Surgical History, Family History and Social History were reviewed in [employer] record.   Physical Exam: BP 140/62  Pulse 60  Ht 5' 8.5" (1.74 m)  Wt 162 lb 8 oz (73.71 kg)  BMI 24.35 kg/m2 General: Well developed white male in no acute distress Head: Normocephalic and atraumatic Eyes:  Sclerae anicteric, conjunctiva pink  Ears: Normal auditory acuity Lungs: Clear throughout to auscultation Heart: Regular rate and rhythm Abdomen: Soft, non-distended.  Normal bowel sounds.  Non-tender. Musculoskeletal: Symmetrical with no gross deformities    Extremities: No edema  Neurological: Alert oriented x 4, grossly non-focal Psychological:  Alert and cooperative. Normal mood and affect  Assessment and Recommendations: -Dysphagia:  Likely multifactorial, but he does have evidence of a cervical esophageal web on MBSS.  Will schedule for EGD with Savary dilation with fluoroscopy with Dr. Deatra Ina at [facility].  The risks, benefits, and alternatives were discussed with the patient and he consents to proceed.

## 2013-12-19 NOTE — Discharge Instructions (Signed)
Monitored Anesthesia Care °Monitored anesthesia care is an anesthesia service for a medical procedure. Anesthesia is the loss of the ability to feel pain. It is produced by medicines called anesthetics. It may affect a small area of your body (local anesthesia), a large area of your body (regional anesthesia), or your entire body (general anesthesia). The need for monitored anesthesia care depends your procedure, your condition, and the potential need for regional or general anesthesia. It is often provided during procedures where:  °· General anesthesia may be needed if there are complications. This is because you need special care when you are under general anesthesia.   °· You will be under local or regional anesthesia. This is so that you are able to have higher levels of anesthesia if needed.   °· You will receive calming medicines (sedatives). This is especially the case if sedatives are given to put you in a semi-conscious state of relaxation (deep sedation). This is because the amount of sedative needed to produce this state can be hard to predict. Too much of a sedative can produce general anesthesia. °Monitored anesthesia care is performed by one or more health care providers who have special training in all types of anesthesia. You will need to meet with these health care providers before your procedure. During this meeting, they will ask you about your medical history. They will also give you instructions to follow. (For example, you will need to stop eating and drinking before your procedure. You may also need to stop or change medicines you are taking.) During your procedure, your health care providers will stay with you. They will:  °· Watch your condition. This includes watching your blood pressure, breathing, and level of pain.   °· Diagnose and treat problems that occur.   °· Give medicines if they are needed. These may include calming medicines (sedatives) and anesthetics.   °· Make sure you are  comfortable.   °Having monitored anesthesia care does not necessarily mean that you will be under anesthesia. It does mean that your health care providers will be able to manage anesthesia if you need it or if it occurs. It also means that you will be able to have a different type of anesthesia than you are having if you need it. When your procedure is complete, your health care providers will continue to watch your condition. They will make sure any medicines wear off before you are allowed to go home.  °Document Released: 11/16/2004 Document Revised: 07/07/2013 Document Reviewed: 04/03/2012 °ExitCare® Patient Information ©2015 [employer]. This information is not intended to replace advice given to you by your health care provider. Make sure you discuss any questions you have with your health care provider. °Gastrointestinal Endoscopy, Care After °Refer to this sheet in the next few weeks. These instructions provide you with information on caring for yourself after your procedure. Your caregiver may also give you more specific instructions. Your treatment has been planned according to current medical practices, but problems sometimes occur. Call your caregiver if you have any problems or questions after your procedure. °HOME CARE INSTRUCTIONS °· If you were given medicine to help you relax (sedative), do not drive, operate machinery, or sign important documents for 24 hours. °· Avoid alcohol and hot or warm beverages for the first 24 hours after the procedure. °· Only take over-the-counter or prescription medicines for pain, discomfort, or fever as directed by your caregiver. You may resume taking your normal medicines unless your caregiver tells you otherwise. Ask your caregiver when you may   resume taking medicines that may cause bleeding, such as aspirin, clopidogrel, or warfarin. °· You may return to your normal diet and activities on the day after your procedure, or as directed by your caregiver. Walking may  help to reduce any bloated feeling in your abdomen. °· Drink enough fluids to keep your urine clear or pale yellow. °· You may gargle with salt water if you have a sore throat. °SEEK IMMEDIATE MEDICAL CARE IF: °· You have severe nausea or vomiting. °· You have severe abdominal pain, abdominal cramps that last longer than 6 hours, or abdominal swelling (distention). °· You have severe shoulder or back pain. °· You have trouble swallowing. °· You have shortness of breath, your breathing is shallow, or you are breathing faster than normal. °· You have a fever or a rapid heartbeat. °· You vomit blood or material that looks like coffee grounds. °· You have bloody, black, or tarry stools. °MAKE SURE YOU: °· Understand these instructions. °· Will watch your condition. °· Will get help right away if you are not doing well or get worse. °Document Released: 10/05/2003 Document Revised: 07/07/2013 Document Reviewed: 05/23/2011 °ExitCare® Patient Information ©2015 [employer]. This information is not intended to replace advice given to you by your health care provider. Make sure you discuss any questions you have with your health care provider. ° °

## 2013-12-19 NOTE — Progress Notes (Signed)
Reviewed and agree with management. Timi Reeser D. Jiah Bari, M.D., FACG  

## 2013-12-19 NOTE — Interval H&P Note (Signed)
History and Physical Interval Note:  12/19/2013 11:41 AM  [name]  has presented today for surgery, with the diagnosis of Dysphagia   The various methods of treatment have been discussed with the patient and family. After consideration of risks, benefits and other options for treatment, the patient has consented to  Procedure(s) with comments: ESOPHAGOGASTRODUODENOSCOPY (EGD) WITH PROPOFOL (N/A) SAVORY DILATION (N/A) - With Fluoroscopy as a surgical intervention .  The patient's history has been reviewed, patient examined, no change in status, stable for surgery.  I have reviewed the patient's chart and labs.  Questions were answered to the patient's satisfaction.     The recent H&P (dated *12/18/13**) was reviewed, the patient was examined and there is no change in the patients condition since that H&P was completed.   [name]  12/19/2013, 11:41 AM   [name]

## 2013-12-19 NOTE — Op Note (Signed)
[facility] [place], [address]   ENDOSCOPY PROCEDURE REPORT  PATIENT: Nathan, Blackwell  MR#: [id number] BIRTHDATE: [birth year] , 29  yrs. old GENDER: male  ENDOSCOPIST: Inda Castle, MD, MD REFFERED CX:KGYJE John, M.D.  PROCEDURE DATE:  2013/12/23 PROCEDURE:   EGD w/ wire guided (savary) dilation  INDICATIONS:1.  dysphagia. MEDICATIONS: Monitored anesthesia care TOPICAL ANESTHETIC:  DESCRIPTION OF PROCEDURE:   After the risks benefits and alternatives of the procedure were thoroughly explained, informed consent was obtained.  The Pentax Gastroscope [id number]  endoscope was introduced through the mouth and advanced to the second portion of the duodenum. The instrument was slowly withdrawn as the mucosa was carefully examined.  Prior to withdrawal of the scope, the guidwire was placed.  The esophagus was dilated successfully.  The patient was recovered in endoscopy and discharged home in satisfactory condition.    ESOPHAGUS: There was a cervical web just beyond the upper esophageal sphincter  The stricture was traversable with resistance.   There was a short fibrotic stricture at the gastroesophageal junction. The stricture was traversable.   Except for the findings listed the EGD was otherwise normal.   STOMACH: A 3 cm hiatal hernia was noted.   Dilation was then performed at the cervical esophagus and the GE junction.  Wire-guided Savary dilators were passed under fluoroscopic guidance.  Dilator: Savary over guidewire Size(s): 56-31-49 Heme: yes  COMPLICATIONS: There were no immediate complications.  ENDOSCOPIC IMPRESSION: 1.   esophageal web and distal esophageal stricture-status post Savary dilation  RECOMMENDATIONS: Repeat dilation in approximately 2 weeks     _______________________________ eSigned:  Inda Castle, MD 12/23/2013 12:28 PM   CPT CODES: ICD CODES:  The ICD and CPT codes recommended by this software  are interpretations from the data that the clinical staff has captured with the software.  The verification of the translation of this report to the ICD and CPT codes and modifiers is the sole responsibility of the health care institution and practicing physician where this report was generated.  [employer] will not be held responsible for the validity of the ICD and CPT codes included on this report.  AMA assumes no liability for data contained or not contained herein. CPT is a Designer, television/film set of the [employer].    PATIENT NAME:  Nathan, Blackwell MR#: [id number]

## 2013-12-19 NOTE — Anesthesia Postprocedure Evaluation (Signed)
  Anesthesia Post-op Note  Patient: Nathan Blackwell  Procedure(s) Performed: Procedure(s) (LRB): ESOPHAGOGASTRODUODENOSCOPY (EGD) WITH PROPOFOL (N/A) SAVORY DILATION (N/A)  Patient Location: PACU  Anesthesia Type: MAC  Level of Consciousness: awake and alert   Airway and Oxygen Therapy: Patient Spontanous Breathing  Post-op Pain: mild  Post-op Assessment: Post-op Vital signs reviewed, Patient's Cardiovascular Status Stable, Respiratory Function Stable, Patent Airway and No signs of Nausea or vomiting  Last Vitals:  Filed Vitals:   12/19/13 1250  BP: 190/35  Pulse: 53  Temp:   Resp: 11    Post-op Vital Signs: stable   Complications: No apparent anesthesia complications

## 2013-12-19 NOTE — Transfer of Care (Signed)
Immediate Anesthesia Transfer of Care Note  Patient: Nathan Blackwell  Procedure(s) Performed: Procedure(s) with comments: ESOPHAGOGASTRODUODENOSCOPY (EGD) WITH PROPOFOL (N/A) SAVORY DILATION (N/A) - With Fluoroscopy  Patient Location: PACU  Anesthesia Type:MAC  Level of Consciousness: sedated  Airway & Oxygen Therapy: Patient Spontanous Breathing and Patient connected to nasal cannula oxygen  Post-op Assessment: Report given to PACU RN and Post -op Vital signs reviewed and stable  Post vital signs: Reviewed and stable  Complications: No apparent anesthesia complications

## 2013-12-19 NOTE — Anesthesia Preprocedure Evaluation (Addendum)
Anesthesia Evaluation  Patient identified by MRN, date of birth, ID band Patient awake    Reviewed: Allergy & Precautions, H&P , NPO status , Patient's Chart, lab work & pertinent test results  Airway Mallampati: II TM Distance: >3 FB Neck ROM: Full    Dental  (+) Partial Upper   Pulmonary sleep apnea ,  breath sounds clear to auscultation  Pulmonary exam normal       Cardiovascular hypertension, + Peripheral Vascular Disease negative cardio ROS  + dysrhythmias Rhythm:Regular Rate:Normal     Neuro/Psych PSYCHIATRIC DISORDERS Depression negative neurological ROS     GI/Hepatic Neg liver ROS, GERD-  ,  Endo/Other  diabetes, Type 2Hypothyroidism   Renal/GU Renal InsufficiencyRenal disease  negative genitourinary   Musculoskeletal negative musculoskeletal ROS (+)   Abdominal   Peds negative pediatric ROS (+)  Hematology  (+) anemia ,   Anesthesia Other Findings   Reproductive/Obstetrics negative OB ROS                          Anesthesia Physical Anesthesia Plan  ASA: III  Anesthesia Plan: MAC   Post-op Pain Management:    Induction: Intravenous  Airway Management Planned:   Additional Equipment:   Intra-op Plan:   Post-operative Plan:   Informed Consent: I have reviewed the patients History and Physical, chart, labs and discussed the procedure including the risks, benefits and alternatives for the proposed anesthesia with the patient or authorized representative who has indicated his/her understanding and acceptance.   Dental advisory given  Plan Discussed with: CRNA  Anesthesia Plan Comments:         Anesthesia Quick Evaluation

## 2013-12-22 ENCOUNTER — Encounter (HOSPITAL_COMMUNITY): Payer: Self-pay | Admitting: Gastroenterology

## 2013-12-23 ENCOUNTER — Encounter (HOSPITAL_COMMUNITY)
Admission: RE | Admit: 2013-12-23 | Discharge: 2013-12-23 | Disposition: A | Discharge status: Home / Self Care | Payer: Medicare Other | Source: Ambulatory Visit | Attending: Nephrology | Admitting: Nephrology

## 2013-12-23 DIAGNOSIS — N184 Chronic kidney disease, stage 4 (severe): Secondary | ICD-10-CM | POA: Insufficient documentation

## 2013-12-23 DIAGNOSIS — D631 Anemia in chronic kidney disease: Secondary | ICD-10-CM | POA: Insufficient documentation

## 2013-12-23 LAB — IRON AND TIBC
Iron: 89 ug/dL (ref 42–135)
SATURATION RATIOS: 31 % (ref 20–55)
TIBC: 288 ug/dL (ref 215–435)
UIBC: 199 ug/dL (ref 125–400)

## 2013-12-23 LAB — POCT HEMOGLOBIN-HEMACUE: Hemoglobin: 12.1 g/dL — ABNORMAL LOW (ref 13.0–17.0)

## 2013-12-23 LAB — FERRITIN: FERRITIN: 47 ng/mL (ref 22–322)

## 2013-12-23 MED ORDER — EPOETIN ALFA 10000 UNIT/ML IJ SOLN: 10000.0000 [IU] | INTRAMUSCULAR | Status: DC

## 2013-12-26 ENCOUNTER — Telehealth: Payer: Self-pay | Admitting: Gastroenterology

## 2013-12-26 ENCOUNTER — Encounter (HOSPITAL_COMMUNITY): Payer: Self-pay | Admitting: *Deleted

## 2013-12-26 NOTE — Telephone Encounter (Signed)
He is now scheduled for 12/30/13 at [facility]

## 2013-12-29 ENCOUNTER — Encounter (HOSPITAL_COMMUNITY): Payer: Self-pay | Admitting: Pharmacy Technician

## 2013-12-30 ENCOUNTER — Ambulatory Visit (HOSPITAL_COMMUNITY): Payer: Medicare Other

## 2013-12-30 ENCOUNTER — Ambulatory Visit (HOSPITAL_COMMUNITY)
Admission: RE | Admit: 2013-12-30 | Discharge: 2013-12-30 | Disposition: A | Discharge status: Home / Self Care | Payer: Medicare Other | Source: Ambulatory Visit | Attending: Gastroenterology | Admitting: Gastroenterology

## 2013-12-30 ENCOUNTER — Encounter (HOSPITAL_COMMUNITY): Payer: Medicare Other | Admitting: Anesthesiology

## 2013-12-30 ENCOUNTER — Encounter (HOSPITAL_COMMUNITY): Payer: Self-pay | Admitting: *Deleted

## 2013-12-30 ENCOUNTER — Encounter (HOSPITAL_COMMUNITY)
Admission: RE | Disposition: A | Discharge status: Home / Self Care | Payer: Self-pay | Source: Ambulatory Visit | Attending: Gastroenterology

## 2013-12-30 ENCOUNTER — Ambulatory Visit (HOSPITAL_COMMUNITY): Payer: Medicare Other | Admitting: Anesthesiology

## 2013-12-30 DIAGNOSIS — K222 Esophageal obstruction: Secondary | ICD-10-CM | POA: Diagnosis not present

## 2013-12-30 DIAGNOSIS — K228 Other specified diseases of esophagus: Secondary | ICD-10-CM

## 2013-12-30 DIAGNOSIS — K449 Diaphragmatic hernia without obstruction or gangrene: Secondary | ICD-10-CM | POA: Diagnosis not present

## 2013-12-30 DIAGNOSIS — D649 Anemia, unspecified: Secondary | ICD-10-CM | POA: Insufficient documentation

## 2013-12-30 DIAGNOSIS — N189 Chronic kidney disease, unspecified: Secondary | ICD-10-CM | POA: Diagnosis not present

## 2013-12-30 DIAGNOSIS — E119 Type 2 diabetes mellitus without complications: Secondary | ICD-10-CM | POA: Diagnosis not present

## 2013-12-30 DIAGNOSIS — I129 Hypertensive chronic kidney disease with stage 1 through stage 4 chronic kidney disease, or unspecified chronic kidney disease: Secondary | ICD-10-CM | POA: Insufficient documentation

## 2013-12-30 DIAGNOSIS — E039 Hypothyroidism, unspecified: Secondary | ICD-10-CM | POA: Diagnosis not present

## 2013-12-30 DIAGNOSIS — F329 Major depressive disorder, single episode, unspecified: Secondary | ICD-10-CM | POA: Diagnosis not present

## 2013-12-30 DIAGNOSIS — G4730 Sleep apnea, unspecified: Secondary | ICD-10-CM | POA: Insufficient documentation

## 2013-12-30 DIAGNOSIS — R1310 Dysphagia, unspecified: Secondary | ICD-10-CM | POA: Diagnosis not present

## 2013-12-30 DIAGNOSIS — K2289 Other specified disease of esophagus: Secondary | ICD-10-CM

## 2013-12-30 HISTORY — PX: SAVORY DILATION: SHX5439

## 2013-12-30 HISTORY — PX: ESOPHAGOGASTRODUODENOSCOPY: SHX5428

## 2013-12-30 SURGERY — EGD (ESOPHAGOGASTRODUODENOSCOPY)
Anesthesia: Monitor Anesthesia Care

## 2013-12-30 SURGERY — ESOPHAGOGASTRODUODENOSCOPY (EGD) WITH PROPOFOL
Anesthesia: Monitor Anesthesia Care

## 2013-12-30 MED ORDER — PROPOFOL 10 MG/ML IV BOLUS
INTRAVENOUS | Status: AC
  Filled 2013-12-30: qty 20

## 2013-12-30 MED ORDER — SODIUM CHLORIDE 0.9 % IV SOLN: INTRAVENOUS | Status: DC

## 2013-12-30 MED ORDER — LACTATED RINGERS IV SOLN
INTRAVENOUS | Status: DC
  Administered 2013-12-30: 1000 mL via INTRAVENOUS

## 2013-12-30 MED ORDER — BUTAMBEN-TETRACAINE-BENZOCAINE 2-2-14 % EX AERO
INHALATION_SPRAY | CUTANEOUS | Status: DC | PRN
  Administered 2013-12-30: 2 via TOPICAL

## 2013-12-30 MED ORDER — PROPOFOL 10 MG/ML IV BOLUS
INTRAVENOUS | Status: DC | PRN
  Administered 2013-12-30: 25 mg via INTRAVENOUS
  Administered 2013-12-30: 50 mg via INTRAVENOUS

## 2013-12-30 MED ORDER — LACTATED RINGERS IV SOLN
INTRAVENOUS | Status: DC | PRN
  Administered 2013-12-30: 09:00:00 via INTRAVENOUS

## 2013-12-30 NOTE — Anesthesia Preprocedure Evaluation (Signed)
Anesthesia Evaluation  Patient identified by MRN, date of birth, ID band Patient awake    Reviewed: Allergy & Precautions, H&P , NPO status , Patient's Chart, lab work & pertinent test results  Airway        Dental   Pulmonary sleep apnea ,          Cardiovascular hypertension, Pt. on medications DVT + dysrhythmias     Neuro/Psych PSYCHIATRIC DISORDERS Depression negative neurological ROS     GI/Hepatic Neg liver ROS, GERD-  ,  Endo/Other  diabetesHypothyroidism   Renal/GU CRF and Renal InsufficiencyRenal diseasenegative Renal ROS     Musculoskeletal negative musculoskeletal ROS (+)   Abdominal   Peds  Hematology  (+) Blood dyscrasia, anemia ,   Anesthesia Other Findings   Reproductive/Obstetrics negative OB ROS                             Anesthesia Physical Anesthesia Plan  ASA: III  Anesthesia Plan: MAC   Post-op Pain Management:    Induction:   Airway Management Planned:   Additional Equipment:   Intra-op Plan:   Post-operative Plan:   Informed Consent: I have reviewed the patients History and Physical, chart, labs and discussed the procedure including the risks, benefits and alternatives for the proposed anesthesia with the patient or authorized representative who has indicated his/her understanding and acceptance.   Dental advisory given  Plan Discussed with: CRNA  Anesthesia Plan Comments:         Anesthesia Quick Evaluation                                  Anesthesia Evaluation  Patient identified by MRN, date of birth, ID band Patient awake    Reviewed: Allergy & Precautions, H&P , NPO status , Patient's Chart, lab work & pertinent test results  Airway Mallampati: II TM Distance: >3 FB Neck ROM: Full    Dental  (+) Partial Upper   Pulmonary sleep apnea ,  breath sounds clear to auscultation  Pulmonary exam normal        Cardiovascular hypertension, + Peripheral Vascular Disease negative cardio ROS  + dysrhythmias Rhythm:Regular Rate:Normal     Neuro/Psych PSYCHIATRIC DISORDERS Depression negative neurological ROS     GI/Hepatic Neg liver ROS, GERD-  ,  Endo/Other  diabetes, Type 2Hypothyroidism   Renal/GU Renal InsufficiencyRenal disease  negative genitourinary   Musculoskeletal negative musculoskeletal ROS (+)   Abdominal   Peds negative pediatric ROS (+)  Hematology  (+) anemia ,   Anesthesia Other Findings   Reproductive/Obstetrics negative OB ROS                          Anesthesia Physical Anesthesia Plan  ASA: III  Anesthesia Plan: MAC   Post-op Pain Management:    Induction: Intravenous  Airway Management Planned:   Additional Equipment:   Intra-op Plan:   Post-operative Plan:   Informed Consent: I have reviewed the patients History and Physical, chart, labs and discussed the procedure including the risks, benefits and alternatives for the proposed anesthesia with the patient or authorized representative who has indicated his/her understanding and acceptance.   Dental advisory given  Plan Discussed with: CRNA  Anesthesia Plan Comments:         Anesthesia Quick Evaluation

## 2013-12-30 NOTE — Transfer of Care (Signed)
Immediate Anesthesia Transfer of Care Note  Patient: Nathan Blackwell  Procedure(s) Performed: Procedure(s): ESOPHAGOGASTRODUODENOSCOPY (EGD) (N/A) SAVORY DILATION (N/A)  Patient Location: PACU  Anesthesia Type:MAC  Level of Consciousness: awake, sedated and patient cooperative  Airway & Oxygen Therapy: Patient Spontanous Breathing and Patient connected to nasal cannula oxygen  Post-op Assessment: Report given to PACU RN and Post -op Vital signs reviewed and stable  Post vital signs: Reviewed and stable  Complications: No apparent anesthesia complications

## 2013-12-30 NOTE — Anesthesia Postprocedure Evaluation (Signed)
Anesthesia Post Note  Patient: Nathan Blackwell  Procedure(s) Performed: Procedure(s) (LRB): ESOPHAGOGASTRODUODENOSCOPY (EGD) (N/A) SAVORY DILATION (N/A)  Anesthesia type: MAC  Patient location: PACU  Post pain: Pain level controlled  Post assessment: Post-op Vital signs reviewed  Last Vitals: BP 205/74  Pulse 48  Temp(Src) 36.6 C (Oral)  Resp 14  SpO2 99%  Post vital signs: Reviewed  Level of consciousness: awake  Complications: No apparent anesthesia complications

## 2013-12-30 NOTE — Interval H&P Note (Signed)
History and Physical Interval Note:  12/30/2013 9:17 AM  Nathan Blackwell  has presented today for surgery, with the diagnosis of dysphagia  The various methods of treatment have been discussed with the patient and family. After consideration of risks, benefits and other options for treatment, the patient has consented to  Procedure(s): ESOPHAGOGASTRODUODENOSCOPY (EGD) (N/A) SAVORY DILATION (N/A) as a surgical intervention .  The patient's history has been reviewed, patient examined, no change in status, stable for surgery.  I have reviewed the patient's chart and labs.  Questions were answered to the patient's satisfaction.    The recent H&P (dated *12/19/13**) was reviewed, the patient was examined and there is no change in the patients condition since that H&P was completed.   [name]  12/30/2013, 9:17 AM     [name]

## 2013-12-30 NOTE — Op Note (Signed)
[facility] [place], [address]   ENDOSCOPY PROCEDURE REPORT  PATIENT: Nathan, Blackwell  MR#: [id number] BIRTHDATE: [birth year] , 28  yrs. old GENDER: male  ENDOSCOPIST: Inda Castle, MD, MD REFFERED ZP:HXTAV John, M.D.  PROCEDURE DATE:  01/01/2014 PROCEDURE:   EGD w/ wire guided (savary) dilation  INDICATIONS:1.  dysphagia. MEDICATIONS: Monitored anesthesia care TOPICAL ANESTHETIC:  DESCRIPTION OF PROCEDURE:   After the risks benefits and alternatives of the procedure were thoroughly explained, informed consent was obtained.  The Pentax Gastroscope [id number]  endoscope was introduced through the mouth and advanced to the second portion of the duodenum. The instrument was slowly withdrawn as the mucosa was carefully examined.  Prior to withdrawal of the scope, the guidwire was placed.  The esophagus was dilated successfully.  The patient was recovered in endoscopy and discharged home in satisfactory condition.    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  The stricture was easily traversable. Except for the findings listed the EGD was otherwise normal. STOMACH: A 2 cm hiatal hernia was noted.   Dilation was then performed at the gastroesophageal junction  Dilator: Savary over guidewire Size(s)under fluoroscopic guidance: 15-16 Heme: yes Appearance: tear visualized Because of moderate heme on the 16 mm dilatorthe procedure was terminated.  Repeat endoscopy demonstrated superficial mucosal tears at the GE junction.  COMPLICATIONS: There were no immediate complications.  ENDOSCOPIC IMPRESSION: 1.   There was a short stricture at the gastroesophageal junction - status post Savary dilatation 2.   EGD was otherwise normal 3.   2 cm hiatal hernia  RECOMMENDATIONS: repeat dilatation as needed     _______________________________ eSigned:  Inda Castle, MD 01-01-2014 10:09 AM   CPT CODES: ICD  CODES:  The ICD and CPT codes recommended by this software are interpretations from the data that the clinical staff has captured with the software.  The verification of the translation of this report to the ICD and CPT codes and modifiers is the sole responsibility of the health care institution and practicing physician where this report was generated.  [employer] will not be held responsible for the validity of the ICD and CPT codes included on this report.  AMA assumes no liability for data contained or not contained herein. [employer] is a Designer, television/film set of the [employer].    PATIENT NAME:  Nathan, Blackwell MR#: [id number]

## 2013-12-30 NOTE — Discharge Instructions (Signed)
°Esophageal Dilatation °The esophagus is the long, narrow tube which carries food and liquid from the mouth to the stomach. Esophageal dilatation is the technique used to stretch a blocked or narrowed portion of the esophagus. This procedure is used when a part of the esophagus has become so narrow that it becomes difficult, painful or even impossible to swallow. This is generally an uncomplicated form of treatment. When this is not successful, chest surgery may be required. This is a much more extensive form of treatment with a longer recovery time. °CAUSES  °Some of the more common causes of blockage or strictures of the esophagus are: °· Narrowing from longstanding inflammation (soreness and redness) of the lower esophagus. This comes from the constant exposure of the lower esophagus to the acid which bubbles up from the stomach. Over time this causes scarring and narrowing of the lower esophagus. °· Hiatal hernia in which a small part of the stomach bulges (herniates) up through the diaphragm. This can cause a gradual narrowing of the end of the esophagus. °· Schatzki ring is a narrow ring of benign (non-cancerous) fibrous tissue which constricts the lower esophagus. The reason for this is not known. °· Scleroderma is a connective tissue disorder that affects the esophagus and makes swallowing difficult. °· Achalasia is an absence of nerves to the lower esophagus and to the esophageal sphincter. This is the circular muscle between the stomach and esophagus that relaxes to allow food into the stomach. After swallowing, it contracts to keep food in the stomach. This absence of nerves may be congenital (present since birth). This can cause irregular spasms of the lower esophageal muscle. This spasm does not open up to allow food and fluid through. The result is a persistent blockage with subsequent slow trickling of the esophageal contents into the stomach. °· Strictures may develop from swallowing materials which  damage the esophagus. Some examples are strong acids or alkalis such as lye. °· Growths such as benign (non-cancerous) and malignant (cancerous) tumors can block the esophagus. °· Hereditary (present since birth) causes. °DIAGNOSIS  °Your caregiver often suspects this problem by taking a medical history. They will also do a physical exam. They can then prove their suspicions using X-rays and endoscopy. Endoscopy is an exam in which a tube like a small, flexible telescope is used to look at your esophagus.  °TREATMENT °There are different stretching (dilating) techniques that can be used. Simple bougie dilatation may be done in the office. This usually takes only a couple minutes. A numbing (anesthetic) spray of the throat is used. Endoscopy, when done, is done in an endoscopy suite under mild sedation. When fluoroscopy is used, the procedure is performed in X-ray. Other techniques require a little longer time. Recovery is usually quick. There is no waiting time to begin eating and drinking to test success of the treatment. Following are some of the methods used. °Narrowing of the esophagus is treated by making it bigger. °Commonly this is a mechanical problem which can be treated with stretching. This can be done in different ways. Your caregiver will discuss these with you. Some of the means used are: °· A series of graduated (increasing thickness) flexible dilators can be used. These are weighted tubes passed through the esophagus into the stomach. The tubes used become progressively larger until the desired stretched size is reached. Graduated dilators are a simple and quick way of opening the esophagus. No visualization is required. °· Another method is the use of endoscopy to place   a flexible wire across the stricture. The endoscope is removed and the wire left in place. A dilator with a hole through it from end to end is guided down the esophagus and across the stricture. One or more of these dilators are  passed over the wire. At the end of the exam, the wire is removed. This type of treatment may be performed in the X-ray department under fluoroscopy. An advantage of this procedure is the examiner is visualizing the end opening in the esophagus. °· Stretching of the esophagus may be done using balloons. Deflated balloons are placed through the endoscope and across the stricture. This type of balloon dilatation is often done at the time of endoscopy or fluoroscopy. Flexible endoscopy allows the examiner to directly view the stricture. A balloon is inserted in the deflated form into the area of narrowing. It is then inflated with air to a certain pressure that is preset for a given circumference. When inflated, it becomes sausage shaped, stretched, and makes the stricture larger. °· Achalasia requires a longer, larger balloon-type dilator. This is frequently done under X-ray control. In this situation, the spastic muscle fibers in the lower esophagus are stretched. °All of the above procedures make the passage of food and water into the stomach easier. They also make it easier for stomach contents to reflux back into the esophagus. Special medications may be used following the procedure to help prevent further stricturing. Proton-pump inhibitor medications are good at decreasing the amount of acid in the stomach juice. When stomach juice refluxes into the esophagus, the juice is no longer as acidic and is less likely to burn or scar the esophagus. °RISKS AND COMPLICATIONS °Esophageal dilatation is usually performed effectively and without problems. Some complications that can occur are: °· A small amount of bleeding almost always happens where the stretching takes place. If this is too excessive it may require more aggressive treatment. °· An uncommon complication is perforation (making a hole) of the esophagus. The esophagus is thin. It is easy to make a hole in it. If this happens, an operation may be necessary to  repair this. °· A small, undetected perforation could lead to an infection in the chest. This can be very serious. °HOME CARE INSTRUCTIONS  °· If you received sedation for your procedure, do not drive, make important decisions, or perform any activities requiring your full coordination. Do not drink alcohol, take sedatives, or use any mind altering chemicals unless instructed by your caregiver. °· You may use throat lozenges or warm salt water gargles if you have throat discomfort. °· You can begin eating and drinking normally on return home unless instructed otherwise. Do not purposely try to force large chunks of food down to test the benefits of your procedure. °· Mild discomfort can be eased with sips of ice water. °· Medications for discomfort may or may not be needed. °SEEK IMMEDIATE MEDICAL CARE IF:  °· You begin vomiting up blood. °· You develop black, tarry stools. °· You develop chills or an unexplained temperature of over 101°F (38.3°C) °· You develop chest or abdominal pain. °· You develop shortness of breath, or feel light-headed or faint. °· Your swallowing is becoming more painful, difficult, or you are unable to swallow. °MAKE SURE YOU:  °· Understand these instructions. °· Will watch your condition. °· Will get help right away if you are not doing well or get worse. °Document Released: 04/13/2005 Document Revised: 07/07/2013 Document Reviewed: 05/31/2005 °ExitCare® Patient Information ©2015 [employer].   This information is not intended to replace advice given to you by your health care provider. Make sure you discuss any questions you have with your health care provider. °Esophagogastroduodenoscopy °Care After °Refer to this sheet in the next few weeks. These instructions provide you with information on caring for yourself after your procedure. Your caregiver may also give you more specific instructions. Your treatment has been planned according to current medical practices, but problems sometimes  occur. Call your caregiver if you have any problems or questions after your procedure.  °HOME CARE INSTRUCTIONS °· Do not eat or drink anything until the numbing medicine (local anesthetic) has worn off and your gag reflex has returned. You will know that the local anesthetic has worn off when you can swallow comfortably. °· Do not drive for 12 hours after the procedure or as directed by your caregiver. °· Only take medicines as directed by your caregiver. °SEEK MEDICAL CARE IF:  °· You cannot stop coughing. °· You are not urinating at all or less than usual. °SEEK IMMEDIATE MEDICAL CARE IF: °· You have difficulty swallowing. °· You cannot eat or drink. °· You have worsening throat or chest pain. °· You have dizziness, lightheadedness, or you faint. °· You have nausea or vomiting. °· You have chills. °· You have a fever. °· You have severe abdominal pain. °· You have black, tarry, or bloody stools. °Document Released: 02/07/2012 Document Reviewed: 02/07/2012 °ExitCare® Patient Information ©2015 [employer]. This information is not intended to replace advice given to you by your health care provider. Make sure you discuss any questions you have with your health care provider. ° °

## 2013-12-30 NOTE — H&P (View-Only) (Signed)
Reviewed and agree with management. Robert D. Kaplan, M.D., FACG  

## 2014-01-01 ENCOUNTER — Encounter (HOSPITAL_COMMUNITY): Payer: Self-pay | Admitting: Gastroenterology

## 2014-01-06 ENCOUNTER — Encounter (HOSPITAL_COMMUNITY)
Admission: RE | Admit: 2014-01-06 | Discharge: 2014-01-06 | Disposition: A | Discharge status: Home / Self Care | Payer: Medicare Other | Source: Ambulatory Visit | Attending: Nephrology | Admitting: Nephrology

## 2014-01-06 ENCOUNTER — Other Ambulatory Visit: Payer: Self-pay

## 2014-01-06 DIAGNOSIS — N184 Chronic kidney disease, stage 4 (severe): Secondary | ICD-10-CM | POA: Diagnosis not present

## 2014-01-06 DIAGNOSIS — D631 Anemia in chronic kidney disease: Secondary | ICD-10-CM | POA: Insufficient documentation

## 2014-01-06 LAB — POCT HEMOGLOBIN-HEMACUE: Hemoglobin: 11.9 g/dL — ABNORMAL LOW (ref 13.0–17.0)

## 2014-01-06 MED ORDER — FUROSEMIDE 80 MG PO TABS: 80.0000 mg | ORAL_TABLET | Freq: Two times a day (BID) | ORAL | Status: DC

## 2014-01-06 MED ORDER — EPOETIN ALFA 10000 UNIT/ML IJ SOLN
10000.0000 [IU] | INTRAMUSCULAR | Status: DC
Start: 2014-01-06 — End: 2014-01-07

## 2014-01-13 DIAGNOSIS — L602 Onychogryphosis: Secondary | ICD-10-CM | POA: Diagnosis not present

## 2014-01-13 DIAGNOSIS — E1151 Type 2 diabetes mellitus with diabetic peripheral angiopathy without gangrene: Secondary | ICD-10-CM | POA: Diagnosis not present

## 2014-01-20 ENCOUNTER — Encounter (HOSPITAL_COMMUNITY)
Admission: RE | Admit: 2014-01-20 | Discharge: 2014-01-20 | Disposition: A | Discharge status: Home / Self Care | Payer: Medicare Other | Source: Ambulatory Visit | Attending: Nephrology | Admitting: Nephrology

## 2014-01-20 DIAGNOSIS — H02834 Dermatochalasis of left upper eyelid: Secondary | ICD-10-CM | POA: Diagnosis not present

## 2014-01-20 DIAGNOSIS — D631 Anemia in chronic kidney disease: Secondary | ICD-10-CM | POA: Insufficient documentation

## 2014-01-20 DIAGNOSIS — H01023 Squamous blepharitis right eye, unspecified eyelid: Secondary | ICD-10-CM | POA: Diagnosis not present

## 2014-01-20 DIAGNOSIS — N184 Chronic kidney disease, stage 4 (severe): Secondary | ICD-10-CM | POA: Diagnosis not present

## 2014-01-20 DIAGNOSIS — H02831 Dermatochalasis of right upper eyelid: Secondary | ICD-10-CM | POA: Diagnosis not present

## 2014-01-20 DIAGNOSIS — E119 Type 2 diabetes mellitus without complications: Secondary | ICD-10-CM | POA: Diagnosis not present

## 2014-01-20 LAB — POCT HEMOGLOBIN-HEMACUE: Hemoglobin: 12.1 g/dL — ABNORMAL LOW (ref 13.0–17.0)

## 2014-01-20 LAB — HM DIABETES EYE EXAM

## 2014-01-20 MED ORDER — EPOETIN ALFA 10000 UNIT/ML IJ SOLN: 10000.0000 [IU] | INTRAMUSCULAR | Status: DC

## 2014-01-22 ENCOUNTER — Encounter: Payer: Self-pay | Admitting: Internal Medicine

## 2014-01-31 ENCOUNTER — Other Ambulatory Visit: Payer: Self-pay | Admitting: Internal Medicine

## 2014-02-03 ENCOUNTER — Encounter (HOSPITAL_COMMUNITY): Payer: Medicare Other

## 2014-02-03 ENCOUNTER — Other Ambulatory Visit: Payer: Self-pay

## 2014-02-03 MED ORDER — FUROSEMIDE 80 MG PO TABS: 80.0000 mg | ORAL_TABLET | Freq: Two times a day (BID) | ORAL | Status: DC

## 2014-02-03 MED ORDER — PANCRELIPASE (LIP-PROT-AMYL) 12000-38000 UNITS PO CPEP: 12000.0000 [IU] | ORAL_CAPSULE | Freq: Three times a day (TID) | ORAL | Status: DC

## 2014-02-03 MED ORDER — HYDRALAZINE HCL 25 MG PO TABS: 25.0000 mg | ORAL_TABLET | Freq: Two times a day (BID) | ORAL | Status: DC

## 2014-02-03 MED ORDER — ISOSORBIDE MONONITRATE ER 30 MG PO TB24: 30.0000 mg | ORAL_TABLET | ORAL | Status: DC

## 2014-02-03 MED ORDER — SITAGLIPTIN PHOSPHATE 50 MG PO TABS: 50.0000 mg | ORAL_TABLET | ORAL | Status: DC

## 2014-02-03 MED ORDER — OLANZAPINE 5 MG PO TABS: 5.0000 mg | ORAL_TABLET | ORAL | Status: DC

## 2014-02-03 MED ORDER — LEVOTHYROXINE SODIUM 100 MCG PO TABS: 100.0000 ug | ORAL_TABLET | Freq: Every day | ORAL | Status: DC

## 2014-02-03 MED ORDER — GLUCOSE BLOOD VI STRP: ORAL_STRIP | Status: DC

## 2014-02-03 MED ORDER — LANSOPRAZOLE 30 MG PO CPDR: 30.0000 mg | DELAYED_RELEASE_CAPSULE | ORAL | Status: DC

## 2014-02-03 MED ORDER — CITALOPRAM HYDROBROMIDE 20 MG PO TABS: 20.0000 mg | ORAL_TABLET | ORAL | Status: DC

## 2014-02-03 MED ORDER — TAMSULOSIN HCL 0.4 MG PO CAPS: 0.4000 mg | ORAL_CAPSULE | Freq: Every day | ORAL | Status: DC

## 2014-02-05 ENCOUNTER — Encounter (HOSPITAL_COMMUNITY)
Admission: RE | Admit: 2014-02-05 | Discharge: 2014-02-05 | Disposition: A | Discharge status: Home / Self Care | Payer: Medicare Other | Source: Ambulatory Visit | Attending: Nephrology | Admitting: Nephrology

## 2014-02-05 ENCOUNTER — Telehealth: Payer: Self-pay

## 2014-02-05 DIAGNOSIS — D631 Anemia in chronic kidney disease: Secondary | ICD-10-CM | POA: Insufficient documentation

## 2014-02-05 DIAGNOSIS — N184 Chronic kidney disease, stage 4 (severe): Secondary | ICD-10-CM | POA: Insufficient documentation

## 2014-02-05 LAB — FERRITIN: Ferritin: 42 ng/mL (ref 22–322)

## 2014-02-05 LAB — IRON AND TIBC
Iron: 89 ug/dL (ref 42–135)
SATURATION RATIOS: 32 % (ref 20–55)
TIBC: 282 ug/dL (ref 215–435)
UIBC: 193 ug/dL (ref 125–400)

## 2014-02-05 LAB — POCT HEMOGLOBIN-HEMACUE: Hemoglobin: 12.5 g/dL — ABNORMAL LOW (ref 13.0–17.0)

## 2014-02-05 MED ORDER — EPOETIN ALFA 10000 UNIT/ML IJ SOLN: 10000.0000 [IU] | INTRAMUSCULAR | Status: DC

## 2014-02-05 NOTE — Telephone Encounter (Signed)
Need to know exact rx then as I was not the original prescribing MD and is not on the list  Perhaps pt would be best servied by reqeusting refill from optho if this is who ordered previously

## 2014-02-05 NOTE — Telephone Encounter (Signed)
Patients wife informed of PCP instructions. 

## 2014-02-05 NOTE — Telephone Encounter (Signed)
I think ok to hold off on this, unless we have a specific request from the pt; possibly we should call his wife to see if this is further needed, as this is usually not a chronic refilled medication

## 2014-02-05 NOTE — Telephone Encounter (Signed)
Spoke to his wife and he does use this daily.  Please send into [facility].  She did state he sees an eye doctor in [place] and is going to have eye surgery soon.

## 2014-02-05 NOTE — Telephone Encounter (Signed)
Fax from pharmacy [facility]) requesting a refill on Bacitracin opth oint 3.5 gm not on current list.

## 2014-02-10 ENCOUNTER — Other Ambulatory Visit: Payer: Self-pay | Admitting: *Deleted

## 2014-02-10 MED ORDER — BACITRACIN 500 UNIT/GM OP OINT: TOPICAL_OINTMENT | Freq: Four times a day (QID) | OPHTHALMIC | Status: DC

## 2014-02-10 NOTE — Telephone Encounter (Addendum)
It was the bacitracin oint. Was previously rx back in march.../lmb   Done erx

## 2014-03-05 ENCOUNTER — Encounter (HOSPITAL_COMMUNITY): Payer: Medicare Other

## 2014-03-17 DIAGNOSIS — E1151 Type 2 diabetes mellitus with diabetic peripheral angiopathy without gangrene: Secondary | ICD-10-CM | POA: Diagnosis not present

## 2014-03-17 DIAGNOSIS — L602 Onychogryphosis: Secondary | ICD-10-CM | POA: Diagnosis not present

## 2014-03-20 DIAGNOSIS — N189 Chronic kidney disease, unspecified: Secondary | ICD-10-CM | POA: Diagnosis not present

## 2014-03-20 DIAGNOSIS — N183 Chronic kidney disease, stage 3 (moderate): Secondary | ICD-10-CM | POA: Diagnosis not present

## 2014-03-20 DIAGNOSIS — N2581 Secondary hyperparathyroidism of renal origin: Secondary | ICD-10-CM | POA: Diagnosis not present

## 2014-03-24 DIAGNOSIS — N184 Chronic kidney disease, stage 4 (severe): Secondary | ICD-10-CM | POA: Diagnosis not present

## 2014-03-24 DIAGNOSIS — I10 Essential (primary) hypertension: Secondary | ICD-10-CM | POA: Diagnosis not present

## 2014-03-24 DIAGNOSIS — D631 Anemia in chronic kidney disease: Secondary | ICD-10-CM | POA: Diagnosis not present

## 2014-03-24 DIAGNOSIS — N2581 Secondary hyperparathyroidism of renal origin: Secondary | ICD-10-CM | POA: Diagnosis not present

## 2014-04-03 DIAGNOSIS — L570 Actinic keratosis: Secondary | ICD-10-CM | POA: Diagnosis not present

## 2014-04-03 DIAGNOSIS — L8995 Pressure ulcer of unspecified site, unstageable: Secondary | ICD-10-CM | POA: Diagnosis not present

## 2014-04-03 DIAGNOSIS — L219 Seborrheic dermatitis, unspecified: Secondary | ICD-10-CM | POA: Diagnosis not present

## 2014-04-03 DIAGNOSIS — Z86018 Personal history of other benign neoplasm: Secondary | ICD-10-CM | POA: Diagnosis not present

## 2014-04-03 DIAGNOSIS — Z85828 Personal history of other malignant neoplasm of skin: Secondary | ICD-10-CM | POA: Diagnosis not present

## 2014-04-15 DIAGNOSIS — H02831 Dermatochalasis of right upper eyelid: Secondary | ICD-10-CM | POA: Diagnosis not present

## 2014-04-15 DIAGNOSIS — H0259 Other disorders affecting eyelid function: Secondary | ICD-10-CM | POA: Diagnosis not present

## 2014-04-15 DIAGNOSIS — H02403 Unspecified ptosis of bilateral eyelids: Secondary | ICD-10-CM | POA: Diagnosis not present

## 2014-04-15 DIAGNOSIS — H53453 Other localized visual field defect, bilateral: Secondary | ICD-10-CM | POA: Diagnosis not present

## 2014-05-05 ENCOUNTER — Other Ambulatory Visit (INDEPENDENT_AMBULATORY_CARE_PROVIDER_SITE_OTHER): Payer: Medicare Other

## 2014-05-05 ENCOUNTER — Ambulatory Visit (INDEPENDENT_AMBULATORY_CARE_PROVIDER_SITE_OTHER): Payer: Medicare Other | Admitting: Internal Medicine

## 2014-05-05 ENCOUNTER — Encounter: Payer: Self-pay | Admitting: Internal Medicine

## 2014-05-05 VITALS — BP 142/70 | HR 72 | Temp 97.4°F | Resp 18 | Ht 69.0 in | Wt 167.1 lb

## 2014-05-05 DIAGNOSIS — E1122 Type 2 diabetes mellitus with diabetic chronic kidney disease: Secondary | ICD-10-CM | POA: Diagnosis not present

## 2014-05-05 DIAGNOSIS — E785 Hyperlipidemia, unspecified: Secondary | ICD-10-CM

## 2014-05-05 DIAGNOSIS — I10 Essential (primary) hypertension: Secondary | ICD-10-CM | POA: Diagnosis not present

## 2014-05-05 DIAGNOSIS — N189 Chronic kidney disease, unspecified: Secondary | ICD-10-CM

## 2014-05-05 DIAGNOSIS — Z23 Encounter for immunization: Secondary | ICD-10-CM | POA: Diagnosis not present

## 2014-05-05 LAB — TSH: TSH: 0.62 u[IU]/mL (ref 0.35–4.50)

## 2014-05-05 LAB — CBC WITH DIFFERENTIAL/PLATELET
Basophils Absolute: 0 10*3/uL (ref 0.0–0.1)
Basophils Relative: 0.4 % (ref 0.0–3.0)
EOS ABS: 0.1 10*3/uL (ref 0.0–0.7)
Eosinophils Relative: 1.6 % (ref 0.0–5.0)
HCT: 34.8 % — ABNORMAL LOW (ref 39.0–52.0)
Hemoglobin: 11.9 g/dL — ABNORMAL LOW (ref 13.0–17.0)
LYMPHS PCT: 20.7 % (ref 12.0–46.0)
Lymphs Abs: 1.7 10*3/uL (ref 0.7–4.0)
MCHC: 34 g/dL (ref 30.0–36.0)
MCV: 94.4 fl (ref 78.0–100.0)
Monocytes Absolute: 0.5 10*3/uL (ref 0.1–1.0)
Monocytes Relative: 6.7 % (ref 3.0–12.0)
NEUTROS ABS: 5.7 10*3/uL (ref 1.4–7.7)
NEUTROS PCT: 70.6 % (ref 43.0–77.0)
Platelets: 214 10*3/uL (ref 150.0–400.0)
RBC: 3.69 Mil/uL — ABNORMAL LOW (ref 4.22–5.81)
RDW: 13.5 % (ref 11.5–15.5)
WBC: 8.1 10*3/uL (ref 4.0–10.5)

## 2014-05-05 LAB — BASIC METABOLIC PANEL
BUN: 41 mg/dL — AB (ref 6–23)
CO2: 33 meq/L — AB (ref 19–32)
Calcium: 9.6 mg/dL (ref 8.4–10.5)
Chloride: 103 mEq/L (ref 96–112)
Creatinine, Ser: 1.89 mg/dL — ABNORMAL HIGH (ref 0.40–1.50)
GFR: 35.66 mL/min — AB (ref >60.00)
GLUCOSE: 131 mg/dL — AB (ref 70–99)
POTASSIUM: 3.7 meq/L (ref 3.5–5.1)
Sodium: 142 mEq/L (ref 135–145)

## 2014-05-05 LAB — MICROALBUMIN / CREATININE URINE RATIO
Creatinine,U: 125.7 mg/dL
Microalb Creat Ratio: 5 mg/g (ref 0.0–30.0)
Microalb, Ur: 6.3 mg/dL — ABNORMAL HIGH (ref 0.0–1.9)

## 2014-05-05 LAB — URINALYSIS, ROUTINE W REFLEX MICROSCOPIC
Bilirubin Urine: NEGATIVE
Hgb urine dipstick: NEGATIVE
Ketones, ur: NEGATIVE
LEUKOCYTES UA: NEGATIVE
NITRITE: NEGATIVE
RBC / HPF: NONE SEEN (ref 0–?)
SPECIFIC GRAVITY, URINE: 1.01 (ref 1.000–1.030)
TOTAL PROTEIN, URINE-UPE24: NEGATIVE
Urine Glucose: NEGATIVE
Urobilinogen, UA: 0.2 (ref 0.0–1.0)
pH: 6 (ref 5.0–8.0)

## 2014-05-05 LAB — HEPATIC FUNCTION PANEL
ALBUMIN: 4 g/dL (ref 3.5–5.2)
ALT: 9 U/L (ref 0–53)
AST: 13 U/L (ref 0–37)
Alkaline Phosphatase: 73 U/L (ref 39–117)
Bilirubin, Direct: 0.1 mg/dL (ref 0.0–0.3)
TOTAL PROTEIN: 7.2 g/dL (ref 6.0–8.3)
Total Bilirubin: 0.4 mg/dL (ref 0.2–1.2)

## 2014-05-05 LAB — LIPID PANEL
CHOL/HDL RATIO: 5
Cholesterol: 208 mg/dL — ABNORMAL HIGH (ref 0–200)
HDL: 40.3 mg/dL (ref >39.00)
NONHDL: 167.7
Triglycerides: 268 mg/dL — ABNORMAL HIGH (ref 0.0–149.0)
VLDL: 53.6 mg/dL — ABNORMAL HIGH (ref 0.0–40.0)

## 2014-05-05 LAB — LDL CHOLESTEROL, DIRECT: Direct LDL: 139 mg/dL

## 2014-05-05 LAB — HEMOGLOBIN A1C: Hgb A1c MFr Bld: 7.2 % — ABNORMAL HIGH (ref 4.6–6.5)

## 2014-05-05 NOTE — Progress Notes (Signed)
Pre visit review using our clinic review tool, if applicable. No additional management support is needed unless otherwise documented below in the visit note. 

## 2014-05-05 NOTE — Progress Notes (Signed)
Subjective:    Patient ID: Nathan Blackwell, male    DOB: [birth year], 81 y.o.   MRN: [id number]  HPI  Here for yearly f/u;  Overall doing ok;  Pt denies CP, worsening SOB, DOE, wheezing, orthopnea, PND, worsening LE edema, palpitations, dizziness or syncope.  Pt denies neurological change such as new headache, facial or extremity weakness.  Pt denies polydipsia, polyuria, or low sugar symptoms. Pt states overall good compliance with treatment and medications, good tolerability, and has been trying to follow lower cholesterol diet.  Pt denies worsening depressive symptoms, suicidal ideation or panic. No fever, night sweats, wt loss, loss of appetite, or other constitutional symptoms.  Pt states fair ability with ADL's, has mod fall risk, home safety reviewed and adequate, no other significant changes in hearing or vision, still trying to stay active, wife supportive Past Medical History  Diagnosis Date  . Diabetes mellitus type II   . Hyperlipidemia   . Hypertension   . Osteoporosis   . Prostatic hypertrophy     benign  . OSA (obstructive sleep apnea)   . Rotator cuff syndrome     chronic  . Elevated PSA   . Hypothyroidism   . Nephrolithiasis   . Thyroid nodule   . GERD (gastroesophageal reflux disease)   . Colitis, Clostridium difficile     presumed 11/08  . Dementia   . Anemia     NOS  . Vitamin B 12 deficiency   . BRADYCARDIA 06/10/2009  . COLON CANCER, HX OF 09/20/2006  . HYPOTHYROIDISM 01/14/2007  . DIABETES MELLITUS, TYPE II 09/20/2006  . HYPERLIPIDEMIA 09/20/2006  . ANEMIA-NOS 06/23/2008  . DEMENTIA 05/14/2007  . DEPRESSION 02/27/2007  . SLEEP APNEA, OBSTRUCTIVE 01/14/2007  . HYPERTENSION 09/20/2006  . GERD 01/14/2007  . OSTEOPOROSIS 10/08/2006  . PERIPHERAL EDEMA 09/03/2007  . RENAL INSUFFICIENCY 04/01/2010  . B12 deficiency 05/25/2010  . CLOSTRIDIUM DIFFICILE COLITIS 01/30/2007  . THYROID NODULE 01/14/2007  . CONSTIPATION, RECURRENT 01/06/2010  . BENIGN PROSTATIC HYPERTROPHY  10/08/2006  . ARTHRITIS 02/23/2007  . BACK PAIN, THORACIC REGION 06/04/2008  . COLONIC POLYPS, HX OF 10/08/2006  . NEPHROLITHIASIS, HX OF 01/14/2007  . Cancer of colon dx'd 1998  . Cancer of vocal cord dx'd 1998  . 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 DVT of lower limb, acute 01/12/2011  . Sleep apnea     CPAP dependent   Past Surgical History  Procedure Laterality Date  . Cholecystectomy  1999  . Inguinal herniorrhapy  1984  . Appendectomy  1998  . Colon resection  1998  . Skin cancer extraction      right ear-extensive scar  . Cataract extraction, bilateral    . Nm lexiscan myoview ltd  09/02/2013    Normal LV function and wall motion. EF 62%; rate dependent LBBB with Lexiscan, Low Risk fixed inferior defect suggestive of diaphragmatic attenuation and not infarct with normal wall motion.  . Transthoracic echocardiogram  08/03/2009    Normal LV size. Mild LVH. EF 60-65%. Gr 2 DD, mild MR.  . Esophagogastroduodenoscopy (egd) with propofol N/A 12/19/2013    Procedure: ESOPHAGOGASTRODUODENOSCOPY (EGD) WITH PROPOFOL;  Surgeon: Inda Castle, MD;  Location: [facility];  Service: Endoscopy;  Laterality: N/A;  . Savory dilation N/A 12/19/2013    Procedure: SAVORY DILATION;  Surgeon: Inda Castle, MD;  Location: [facility];  Service: Endoscopy;  Laterality: N/A;  With Fluoroscopy  . Esophagogastroduodenoscopy N/A 12/30/2013    Procedure: ESOPHAGOGASTRODUODENOSCOPY (EGD);  Surgeon: Inda Castle,  MD;  Location: [facility];  Service: Endoscopy;  Laterality: N/A;  . Savory dilation N/A 12/30/2013    Procedure: SAVORY DILATION;  Surgeon: Inda Castle, MD;  Location: [facility];  Service: Endoscopy;  Laterality: N/A;    reports that he has never smoked. He has never used smokeless tobacco. He reports that he does not drink alcohol or use illicit drugs. family history includes Diabetes in his mother; Stroke in his father. Allergies  Allergen Reactions  . Biaxin [Clarithromycin]  Other (See Comments)    Unknown   . Other Other (See Comments)    Nephrologist has told patient not to eat many different foods (tomatoes, Green Foods, etc).   . Sulfa Antibiotics Other (See Comments)    Unknown    Current Outpatient Prescriptions on File Prior to Visit  Medication Sig Dispense Refill  . bacitracin ophthalmic ointment Place into both eyes 4 (four) times daily. 3.5 g 2  . cholecalciferol (VITAMIN D) 1000 UNITS tablet Take 1,000 Units by mouth every morning.     . citalopram (CELEXA) 20 MG tablet Take 1 tablet (20 mg total) by mouth every morning. 90 tablet 3  . docusate sodium (COLACE) 100 MG capsule Take 100 mg by mouth at bedtime.      . furosemide (LASIX) 80 MG tablet Take 1 tablet (80 mg total) by mouth 2 (two) times daily. 180 tablet 3  . glucose blood test strip Use as directed once daily to check blood sugar.  Diagnosis code E11.9 300 each 3  . hydrALAZINE (APRESOLINE) 25 MG tablet Take 1 tablet (25 mg total) by mouth 2 (two) times daily. 180 tablet 3  . isosorbide mononitrate (IMDUR) 30 MG 24 hr tablet Take 1 tablet (30 mg total) by mouth every morning. 90 tablet 3  . lansoprazole (PREVACID) 30 MG capsule Take 1 capsule (30 mg total) by mouth every morning. 90 capsule 3  . levothyroxine (SYNTHROID, LEVOTHROID) 100 MCG tablet Take 1 tablet (100 mcg total) by mouth daily before breakfast. 90 tablet 3  . lipase/protease/amylase (CREON) 12000 UNITS CPEP capsule Take 1 capsule (12,000 Units total) by mouth 3 (three) times daily with meals. 270 capsule 3  . OLANZapine (ZYPREXA) 5 MG tablet Take 1 tablet (5 mg total) by mouth every morning. 90 tablet 3  . QUEtiapine (SEROQUEL) 50 MG tablet Take 1 tablet (50 mg total) by mouth at bedtime. 90 tablet 3  . sitaGLIPtin (JANUVIA) 50 MG tablet Take 1 tablet (50 mg total) by mouth every morning. 90 tablet 3  . tamsulosin (FLOMAX) 0.4 MG CAPS capsule Take 1 capsule (0.4 mg total) by mouth at bedtime. 90 capsule 3   No current  facility-administered medications on file prior to visit.   Review of Systems  Constitutional: Negative for unusual diaphoresis or other sweats  HENT: Negative for ringing in ear Eyes: Negative for double vision or worsening visual disturbance.  Respiratory: Negative for choking and stridor.   Gastrointestinal: Negative for vomiting or other signifcant bowel change Genitourinary: Negative for hematuria or decreased urine volume.  Musculoskeletal: Negative for other MSK pain or swelling Skin: Negative for color change and worsening wound.  Neurological: Negative for tremors and numbness other than noted  Psychiatric/Behavioral: Negative for decreased concentration or agitation other than above       Objective:   Physical Exam  BP 142/70 mmHg  Pulse 72  Temp(Src) 97.4 F (36.3 C) (Oral)  Resp 18  Ht 5\' 9"  (1.753 m)  Wt 167 lb 1.9  oz (75.805 kg)  BMI 24.67 kg/m2  SpO2 96% VS noted, not ill appearing Constitutional: Pt appears thin, frail.  HENT: Head: NCAT.  Right Ear: External ear normal.  Left Ear: External ear normal.  Eyes: . Pupils are equal, round, and reactive to light. Conjunctivae and EOM are normal Neck: Normal range of motion. Neck supple.  Cardiovascular: Normal rate and regular rhythm.   Pulmonary/Chest: Effort normal and breath sounds without rales or wheezing.  Abd:  Soft, NT, ND, + BS Neurological: Pt is alert. At baseline confused , motor grossly intact Skin: Skin is warm. No rash Psychiatric: Pt behavior is normal. No agitation.      Assessment & Plan:

## 2014-05-05 NOTE — Patient Instructions (Signed)
You had the new Prevnar pneuomonia shot today  Please continue all other medications as before, and refills have been done if requested.  Please have the pharmacy call with any other refills you may need.  Please continue your efforts at being more active, low cholesterol diet, and weight control.  You are otherwise up to date with prevention measures today.  Please keep your appointments with your specialists as you may have planned  Please go to the LAB in the Basement (turn left off the elevator) for the tests to be done today  You will be contacted by phone if any changes need to be made immediately.  Otherwise, you will receive a letter about your results with an explanation, but please check with MyChart first.  Please return in 6 months, or sooner if needed

## 2014-05-10 NOTE — Assessment & Plan Note (Signed)
stable overall by history and exam, recent data reviewed with pt, and pt to continue medical treatment as before,  to f/u any worsening symptoms or concerns Lab Results  Component Value Date   LDLCALC 92 09/21/2011

## 2014-05-10 NOTE — Assessment & Plan Note (Signed)
stable overall by history and exam, recent data reviewed with pt, and pt to continue medical treatment as before,  to f/u any worsening symptoms or concerns Lab Results  Component Value Date   HGBA1C 7.2* 05/05/2014

## 2014-05-10 NOTE — Assessment & Plan Note (Signed)
stable overall by history and exam, recent data reviewed with pt, and pt to continue medical treatment as before,  to f/u any worsening symptoms or concerns BP Readings from Last 3 Encounters:  05/05/14 142/70  02/05/14 129/56  01/20/14 132/54

## 2014-05-19 DIAGNOSIS — E1151 Type 2 diabetes mellitus with diabetic peripheral angiopathy without gangrene: Secondary | ICD-10-CM | POA: Diagnosis not present

## 2014-05-19 DIAGNOSIS — L602 Onychogryphosis: Secondary | ICD-10-CM | POA: Diagnosis not present

## 2014-06-24 ENCOUNTER — Ambulatory Visit (INDEPENDENT_AMBULATORY_CARE_PROVIDER_SITE_OTHER): Payer: Medicare Other | Admitting: Internal Medicine

## 2014-06-24 ENCOUNTER — Encounter: Payer: Self-pay | Admitting: Internal Medicine

## 2014-06-24 VITALS — BP 118/76 | HR 58 | Temp 98.3°F | Resp 18 | Ht 69.0 in | Wt 169.1 lb

## 2014-06-24 DIAGNOSIS — E785 Hyperlipidemia, unspecified: Secondary | ICD-10-CM | POA: Diagnosis not present

## 2014-06-24 DIAGNOSIS — N189 Chronic kidney disease, unspecified: Secondary | ICD-10-CM

## 2014-06-24 DIAGNOSIS — E1122 Type 2 diabetes mellitus with diabetic chronic kidney disease: Secondary | ICD-10-CM | POA: Diagnosis not present

## 2014-06-24 DIAGNOSIS — R2689 Other abnormalities of gait and mobility: Secondary | ICD-10-CM | POA: Insufficient documentation

## 2014-06-24 DIAGNOSIS — I10 Essential (primary) hypertension: Secondary | ICD-10-CM

## 2014-06-24 NOTE — Assessment & Plan Note (Signed)
stable overall by history and exam, recent data reviewed with pt, and pt to continue medical treatment as before,  to f/u any worsening symptoms or concerns BP Readings from Last 3 Encounters:  06/24/14 118/76  05/05/14 142/70  02/05/14 129/56

## 2014-06-24 NOTE — Patient Instructions (Signed)
Please continue all other medications as before, and refills have been done if requested.  Please have the pharmacy call with any other refills you may need.  Please continue your efforts at being more active, low cholesterol diet, and weight control.  Please keep your appointments with your specialists as you may have planned  You are given the prescription for the stairlift today

## 2014-06-24 NOTE — Assessment & Plan Note (Signed)
stable overall by history and exam, recent data reviewed with pt, and pt to continue medical treatment as before,  to f/u any worsening symptoms or concerns Lab Results  Component Value Date   HGBA1C 7.2* 05/05/2014

## 2014-06-24 NOTE — Assessment & Plan Note (Signed)
With high risk fall going down stairs, ok for rx for stairlift,  to f/u any worsening symptoms or concerns

## 2014-06-24 NOTE — Assessment & Plan Note (Signed)
stable overall by history and exam, recent data reviewed with pt, and pt to continue medical treatment as before,  to f/u any worsening symptoms or concerns Lab Results  Component Value Date   LDLCALC 92 09/21/2011

## 2014-06-24 NOTE — Progress Notes (Signed)
Subjective:    Patient ID: Nathan Blackwell, male    DOB: 1920-07-11, 81 y.o.   MRN: [id number]  HPI  Here to f/u, walks with walker outside the home, walks with holding furniture in the home, has stairs to the basement where the cars are located, aks for rx for stairlift to get to the basement.  Rx means getting a personal discount, then paying tax on top of that.  Pt denies chest pain, increasing sob or doe, wheezing, orthopnea, PND, increased LE swelling, palpitations, dizziness or syncope.  Pt denies new neurological symptoms such as new headache, or facial or extremity weakness or numbness.  Pt denies polydipsia, polyuria, or low sugar episode.   Pt denies new neurological symptoms such as new headache, or facial or extremity weakness or numbness.   Pt states overall good compliance with meds, mostly trying to follow appropriate diet, with wt overall increased due to dietary excess. Wt Readings from Last 3 Encounters:  06/24/14 169 lb 1.9 oz (76.712 kg)  05/05/14 167 lb 1.9 oz (75.805 kg)  12/19/13 162 lb (73.483 kg)  Has persistent right conjunctivitis, seeing optho for this. Past Medical History  Diagnosis Date  . Diabetes mellitus type II   . Hyperlipidemia   . Hypertension   . Osteoporosis   . Prostatic hypertrophy     benign  . OSA (obstructive sleep apnea)   . Rotator cuff syndrome     chronic  . Elevated PSA   . Hypothyroidism   . Nephrolithiasis   . Thyroid nodule   . GERD (gastroesophageal reflux disease)   . Colitis, Clostridium difficile     presumed 11/08  . Dementia   . Anemia     NOS  . Vitamin B 12 deficiency   . BRADYCARDIA 06/10/2009  . COLON CANCER, HX OF 09/20/2006  . HYPOTHYROIDISM 01/14/2007  . DIABETES MELLITUS, TYPE II 09/20/2006  . HYPERLIPIDEMIA 09/20/2006  . ANEMIA-NOS 06/23/2008  . DEMENTIA 05/14/2007  . DEPRESSION 02/27/2007  . SLEEP APNEA, OBSTRUCTIVE 01/14/2007  . HYPERTENSION 09/20/2006  . GERD 01/14/2007  . OSTEOPOROSIS 10/08/2006  . PERIPHERAL  EDEMA 09/03/2007  . RENAL INSUFFICIENCY 04/01/2010  . B12 deficiency 05/25/2010  . CLOSTRIDIUM DIFFICILE COLITIS 01/30/2007  . THYROID NODULE 01/14/2007  . CONSTIPATION, RECURRENT 01/06/2010  . BENIGN PROSTATIC HYPERTROPHY 10/08/2006  . ARTHRITIS 02/23/2007  . BACK PAIN, THORACIC REGION 06/04/2008  . COLONIC POLYPS, HX OF 10/08/2006  . NEPHROLITHIASIS, HX OF 01/14/2007  . Cancer of colon dx'd 1998  . Cancer of vocal cord dx'd 1998  . 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 DVT of lower limb, acute 01/12/2011  . Sleep apnea     CPAP dependent   Past Surgical History  Procedure Laterality Date  . Cholecystectomy  1999  . Inguinal herniorrhapy  1984  . Appendectomy  1998  . Colon resection  1998  . Skin cancer extraction      right ear-extensive scar  . Cataract extraction, bilateral    . Nm lexiscan myoview ltd  09/02/2013    Normal LV function and wall motion. EF 62%; rate dependent LBBB with Lexiscan, Low Risk fixed inferior defect suggestive of diaphragmatic attenuation and not infarct with normal wall motion.  . Transthoracic echocardiogram  08/03/2009    Normal LV size. Mild LVH. EF 60-65%. Gr 2 DD, mild MR.  . Esophagogastroduodenoscopy (egd) with propofol N/A 12/19/2013    Procedure: ESOPHAGOGASTRODUODENOSCOPY (EGD) WITH PROPOFOL;  Surgeon: Inda Castle, MD;  Location: [facility];  Service: Endoscopy;  Laterality: N/A;  . Savory dilation N/A 12/19/2013    Procedure: SAVORY DILATION;  Surgeon: Inda Castle, MD;  Location: [facility];  Service: Endoscopy;  Laterality: N/A;  With Fluoroscopy  . Esophagogastroduodenoscopy N/A 12/30/2013    Procedure: ESOPHAGOGASTRODUODENOSCOPY (EGD);  Surgeon: Inda Castle, MD;  Location: [facility];  Service: Endoscopy;  Laterality: N/A;  . Savory dilation N/A 12/30/2013    Procedure: SAVORY DILATION;  Surgeon: Inda Castle, MD;  Location: [facility];  Service: Endoscopy;  Laterality: N/A;    reports that he has never smoked. He has  never used smokeless tobacco. He reports that he does not drink alcohol or use illicit drugs. family history includes Diabetes in his mother; Stroke in his father. Allergies  Allergen Reactions  . Biaxin [Clarithromycin] Other (See Comments)    Unknown   . Other Other (See Comments)    Nephrologist has told patient not to eat many different foods (tomatoes, Green [employer], etc).   . Sulfa Antibiotics Other (See Comments)    Unknown    Current Outpatient Prescriptions on File Prior to Visit  Medication Sig Dispense Refill  . bacitracin ophthalmic ointment Place into both eyes 4 (four) times daily. 3.5 g 2  . cholecalciferol (VITAMIN D) 1000 UNITS tablet Take 1,000 Units by mouth every morning.     . citalopram (CELEXA) 20 MG tablet Take 1 tablet (20 mg total) by mouth every morning. 90 tablet 3  . docusate sodium (COLACE) 100 MG capsule Take 100 mg by mouth at bedtime.      . furosemide (LASIX) 80 MG tablet Take 1 tablet (80 mg total) by mouth 2 (two) times daily. 180 tablet 3  . glucose blood test strip Use as directed once daily to check blood sugar.  Diagnosis code E11.9 300 each 3  . hydrALAZINE (APRESOLINE) 25 MG tablet Take 1 tablet (25 mg total) by mouth 2 (two) times daily. 180 tablet 3  . isosorbide mononitrate (IMDUR) 30 MG 24 hr tablet Take 1 tablet (30 mg total) by mouth every morning. 90 tablet 3  . lansoprazole (PREVACID) 30 MG capsule Take 1 capsule (30 mg total) by mouth every morning. 90 capsule 3  . levothyroxine (SYNTHROID, LEVOTHROID) 100 MCG tablet Take 1 tablet (100 mcg total) by mouth daily before breakfast. 90 tablet 3  . lipase/protease/amylase (CREON) 12000 UNITS CPEP capsule Take 1 capsule (12,000 Units total) by mouth 3 (three) times daily with meals. 270 capsule 3  . OLANZapine (ZYPREXA) 5 MG tablet Take 1 tablet (5 mg total) by mouth every morning. 90 tablet 3  . QUEtiapine (SEROQUEL) 50 MG tablet Take 1 tablet (50 mg total) by mouth at bedtime. 90 tablet 3  .  sitaGLIPtin (JANUVIA) 50 MG tablet Take 1 tablet (50 mg total) by mouth every morning. 90 tablet 3  . tamsulosin (FLOMAX) 0.4 MG CAPS capsule Take 1 capsule (0.4 mg total) by mouth at bedtime. 90 capsule 3   No current facility-administered medications on file prior to visit.   Review of Systems  Constitutional: Negative for unusual diaphoresis or night sweats HENT: Negative for ringing in ear or discharge Eyes: Negative for double vision or worsening visual disturbance.  Respiratory: Negative for choking and stridor.   Gastrointestinal: Negative for vomiting or other signifcant bowel change Genitourinary: Negative for hematuria or change in urine volume.  Musculoskeletal: Negative for other MSK pain or swelling Skin: Negative for color change and worsening wound.  Neurological: Negative for tremors and  numbness other than noted  Psychiatric/Behavioral: Negative for decreased concentration or agitation other than above       Objective:   Physical Exam BP 118/76 mmHg  Pulse 58  Temp(Src) 98.3 F (36.8 C) (Oral)  Resp 18  Ht 5\' 9"  (1.753 m)  Wt 169 lb 1.9 oz (76.712 kg)  BMI 24.96 kg/m2  SpO2 96% VS noted,  Constitutional: Pt appears in no significant distress HENT: Head: NCAT.  Right Ear: External ear normal.  Left Ear: External ear normal.  Eyes: . Pupils are equal, round, and reactive to light. Conjunctivae and EOM are normal Neck: Normal range of motion. Neck supple.  Cardiovascular: Normal rate and regular rhythm.   Pulmonary/Chest: Effort normal and breath sounds without rales or wheezing.  Abd:  Soft, NT, ND, + BS Neurological: Pt is alert. Baseline confused , motor grossly intact Skin: Skin is warm. No rash, trace bilat LE edema Psychiatric: Pt behavior is normal. No agitation.  Lab Results  Component Value Date   WBC 8.1 05/05/2014   HGB 11.9* 05/05/2014   HCT 34.8* 05/05/2014   PLT 214.0 05/05/2014   GLUCOSE 131* 05/05/2014   CHOL 208* 05/05/2014   TRIG  268.0* 05/05/2014   HDL 40.30 05/05/2014   LDLDIRECT 139.0 05/05/2014   LDLCALC 92 09/21/2011   ALT 9 05/05/2014   AST 13 05/05/2014   NA 142 05/05/2014   K 3.7 05/05/2014   CL 103 05/05/2014   CREATININE 1.89* 05/05/2014   BUN 41* 05/05/2014   CO2 33* 05/05/2014   TSH 0.62 05/05/2014   PSA 4.88* 01/14/2007   INR 1.1* 10/30/2013   HGBA1C 7.2* 05/05/2014   MICROALBUR 6.3* 05/05/2014       Assessment & Plan:

## 2014-06-25 ENCOUNTER — Telehealth: Payer: Self-pay | Admitting: Internal Medicine

## 2014-07-11 ENCOUNTER — Other Ambulatory Visit: Payer: Self-pay | Admitting: Internal Medicine

## 2014-07-21 DIAGNOSIS — E1151 Type 2 diabetes mellitus with diabetic peripheral angiopathy without gangrene: Secondary | ICD-10-CM | POA: Diagnosis not present

## 2014-07-21 DIAGNOSIS — L602 Onychogryphosis: Secondary | ICD-10-CM | POA: Diagnosis not present

## 2014-07-23 DIAGNOSIS — N2581 Secondary hyperparathyroidism of renal origin: Secondary | ICD-10-CM | POA: Diagnosis not present

## 2014-07-23 DIAGNOSIS — N184 Chronic kidney disease, stage 4 (severe): Secondary | ICD-10-CM | POA: Diagnosis not present

## 2014-07-23 DIAGNOSIS — N189 Chronic kidney disease, unspecified: Secondary | ICD-10-CM | POA: Diagnosis not present

## 2014-07-28 DIAGNOSIS — D631 Anemia in chronic kidney disease: Secondary | ICD-10-CM | POA: Diagnosis not present

## 2014-07-28 DIAGNOSIS — I10 Essential (primary) hypertension: Secondary | ICD-10-CM | POA: Diagnosis not present

## 2014-07-28 DIAGNOSIS — N184 Chronic kidney disease, stage 4 (severe): Secondary | ICD-10-CM | POA: Diagnosis not present

## 2014-07-28 DIAGNOSIS — N2581 Secondary hyperparathyroidism of renal origin: Secondary | ICD-10-CM | POA: Diagnosis not present

## 2014-07-29 DIAGNOSIS — I10 Essential (primary) hypertension: Secondary | ICD-10-CM | POA: Diagnosis not present

## 2014-08-06 ENCOUNTER — Telehealth: Payer: Self-pay | Admitting: Internal Medicine

## 2014-08-06 MED ORDER — GLUCOSE BLOOD VI STRP: ORAL_STRIP | Status: DC

## 2014-08-06 NOTE — Telephone Encounter (Signed)
Pt wife called in and needs refills sent to [facility] on [address]   Best number to reach [phone]

## 2014-08-06 NOTE — Telephone Encounter (Signed)
Spouse requested refills on pt's test strips. Rx sent, spouse advised

## 2014-08-06 NOTE — Telephone Encounter (Signed)
Pt's spouse called to verify location that test strips were called into. Spouse informed

## 2014-08-06 NOTE — Telephone Encounter (Signed)
Please call [name] (pts wife).

## 2014-08-07 MED ORDER — GLUCOSE BLOOD VI STRP: ORAL_STRIP | Status: DC

## 2014-08-07 NOTE — Telephone Encounter (Signed)
Pt called in and said that [facility] told her that they have not rec'd this refill request?

## 2014-08-07 NOTE — Telephone Encounter (Signed)
Per pharmacist, they are experiencing electronic problems. Verbal authorization given and pharmacy will contact pt's spouse directly to advise that error occurred at the pharmacy.  E-Rx was also sent to [facility] to transfer in case verbal was not sufficient (per pharmacist request).

## 2014-08-07 NOTE — Addendum Note (Signed)
Addended by: Lyman Bishop on: 08/07/2014 01:51 PM   Modules accepted: Orders

## 2014-08-17 ENCOUNTER — Other Ambulatory Visit (INDEPENDENT_AMBULATORY_CARE_PROVIDER_SITE_OTHER): Payer: Medicare Other

## 2014-08-17 ENCOUNTER — Encounter: Payer: Self-pay | Admitting: Internal Medicine

## 2014-08-17 ENCOUNTER — Ambulatory Visit (INDEPENDENT_AMBULATORY_CARE_PROVIDER_SITE_OTHER)
Admission: RE | Admit: 2014-08-17 | Discharge: 2014-08-17 | Disposition: A | Discharge status: Home / Self Care | Payer: Medicare Other | Source: Ambulatory Visit | Attending: Internal Medicine | Admitting: Internal Medicine

## 2014-08-17 ENCOUNTER — Encounter (HOSPITAL_COMMUNITY): Payer: Self-pay | Admitting: Family Medicine

## 2014-08-17 ENCOUNTER — Emergency Department (HOSPITAL_COMMUNITY): Payer: Medicare Other

## 2014-08-17 ENCOUNTER — Inpatient Hospital Stay (HOSPITAL_COMMUNITY)
Admission: EM | Admit: 2014-08-17 | Discharge: 2014-08-21 | DRG: 193 | Disposition: A | Discharge status: Skilled Nursing Facility | Payer: Medicare Other | Attending: Internal Medicine | Admitting: Internal Medicine

## 2014-08-17 ENCOUNTER — Ambulatory Visit (INDEPENDENT_AMBULATORY_CARE_PROVIDER_SITE_OTHER): Payer: Medicare Other | Admitting: Internal Medicine

## 2014-08-17 VITALS — BP 170/78 | HR 89 | Temp 97.8°F | Resp 18 | Wt 163.0 lb

## 2014-08-17 DIAGNOSIS — I10 Essential (primary) hypertension: Secondary | ICD-10-CM | POA: Diagnosis present

## 2014-08-17 DIAGNOSIS — F419 Anxiety disorder, unspecified: Secondary | ICD-10-CM | POA: Diagnosis present

## 2014-08-17 DIAGNOSIS — R1314 Dysphagia, pharyngoesophageal phase: Secondary | ICD-10-CM

## 2014-08-17 DIAGNOSIS — E119 Type 2 diabetes mellitus without complications: Secondary | ICD-10-CM | POA: Diagnosis not present

## 2014-08-17 DIAGNOSIS — I5032 Chronic diastolic (congestive) heart failure: Secondary | ICD-10-CM

## 2014-08-17 DIAGNOSIS — J9601 Acute respiratory failure with hypoxia: Secondary | ICD-10-CM | POA: Diagnosis present

## 2014-08-17 DIAGNOSIS — Z86718 Personal history of other venous thrombosis and embolism: Secondary | ICD-10-CM

## 2014-08-17 DIAGNOSIS — G4733 Obstructive sleep apnea (adult) (pediatric): Secondary | ICD-10-CM | POA: Diagnosis present

## 2014-08-17 DIAGNOSIS — A084 Viral intestinal infection, unspecified: Secondary | ICD-10-CM | POA: Diagnosis present

## 2014-08-17 DIAGNOSIS — Z85038 Personal history of other malignant neoplasm of large intestine: Secondary | ICD-10-CM | POA: Diagnosis not present

## 2014-08-17 DIAGNOSIS — J209 Acute bronchitis, unspecified: Secondary | ICD-10-CM

## 2014-08-17 DIAGNOSIS — R110 Nausea: Secondary | ICD-10-CM | POA: Diagnosis not present

## 2014-08-17 DIAGNOSIS — E785 Hyperlipidemia, unspecified: Secondary | ICD-10-CM | POA: Diagnosis present

## 2014-08-17 DIAGNOSIS — R197 Diarrhea, unspecified: Secondary | ICD-10-CM

## 2014-08-17 DIAGNOSIS — E039 Hypothyroidism, unspecified: Secondary | ICD-10-CM | POA: Diagnosis present

## 2014-08-17 DIAGNOSIS — R278 Other lack of coordination: Secondary | ICD-10-CM | POA: Diagnosis not present

## 2014-08-17 DIAGNOSIS — F329 Major depressive disorder, single episode, unspecified: Secondary | ICD-10-CM | POA: Diagnosis not present

## 2014-08-17 DIAGNOSIS — J129 Viral pneumonia, unspecified: Secondary | ICD-10-CM | POA: Diagnosis not present

## 2014-08-17 DIAGNOSIS — F0390 Unspecified dementia without behavioral disturbance: Secondary | ICD-10-CM | POA: Diagnosis present

## 2014-08-17 DIAGNOSIS — K219 Gastro-esophageal reflux disease without esophagitis: Secondary | ICD-10-CM | POA: Diagnosis present

## 2014-08-17 DIAGNOSIS — Z823 Family history of stroke: Secondary | ICD-10-CM | POA: Diagnosis not present

## 2014-08-17 DIAGNOSIS — R531 Weakness: Secondary | ICD-10-CM

## 2014-08-17 DIAGNOSIS — N183 Chronic kidney disease, stage 3 unspecified: Secondary | ICD-10-CM | POA: Diagnosis present

## 2014-08-17 DIAGNOSIS — Z85828 Personal history of other malignant neoplasm of skin: Secondary | ICD-10-CM | POA: Diagnosis not present

## 2014-08-17 DIAGNOSIS — Z87442 Personal history of urinary calculi: Secondary | ICD-10-CM | POA: Diagnosis not present

## 2014-08-17 DIAGNOSIS — E1122 Type 2 diabetes mellitus with diabetic chronic kidney disease: Secondary | ICD-10-CM

## 2014-08-17 DIAGNOSIS — I5031 Acute diastolic (congestive) heart failure: Secondary | ICD-10-CM | POA: Diagnosis not present

## 2014-08-17 DIAGNOSIS — J189 Pneumonia, unspecified organism: Secondary | ICD-10-CM | POA: Diagnosis not present

## 2014-08-17 DIAGNOSIS — E569 Vitamin deficiency, unspecified: Secondary | ICD-10-CM | POA: Diagnosis not present

## 2014-08-17 DIAGNOSIS — M810 Age-related osteoporosis without current pathological fracture: Secondary | ICD-10-CM | POA: Diagnosis present

## 2014-08-17 DIAGNOSIS — J211 Acute bronchiolitis due to human metapneumovirus: Secondary | ICD-10-CM | POA: Diagnosis not present

## 2014-08-17 DIAGNOSIS — R079 Chest pain, unspecified: Secondary | ICD-10-CM | POA: Diagnosis not present

## 2014-08-17 DIAGNOSIS — R05 Cough: Secondary | ICD-10-CM | POA: Diagnosis not present

## 2014-08-17 DIAGNOSIS — N189 Chronic kidney disease, unspecified: Secondary | ICD-10-CM | POA: Diagnosis not present

## 2014-08-17 DIAGNOSIS — D631 Anemia in chronic kidney disease: Secondary | ICD-10-CM | POA: Diagnosis present

## 2014-08-17 DIAGNOSIS — I129 Hypertensive chronic kidney disease with stage 1 through stage 4 chronic kidney disease, or unspecified chronic kidney disease: Secondary | ICD-10-CM | POA: Diagnosis present

## 2014-08-17 DIAGNOSIS — K5900 Constipation, unspecified: Secondary | ICD-10-CM | POA: Diagnosis not present

## 2014-08-17 DIAGNOSIS — Z833 Family history of diabetes mellitus: Secondary | ICD-10-CM | POA: Diagnosis not present

## 2014-08-17 DIAGNOSIS — H1030 Unspecified acute conjunctivitis, unspecified eye: Secondary | ICD-10-CM | POA: Diagnosis not present

## 2014-08-17 DIAGNOSIS — E860 Dehydration: Secondary | ICD-10-CM | POA: Diagnosis not present

## 2014-08-17 DIAGNOSIS — E876 Hypokalemia: Secondary | ICD-10-CM | POA: Diagnosis present

## 2014-08-17 DIAGNOSIS — R112 Nausea with vomiting, unspecified: Secondary | ICD-10-CM | POA: Diagnosis not present

## 2014-08-17 DIAGNOSIS — I82409 Acute embolism and thrombosis of unspecified deep veins of unspecified lower extremity: Secondary | ICD-10-CM | POA: Diagnosis present

## 2014-08-17 DIAGNOSIS — I5030 Unspecified diastolic (congestive) heart failure: Secondary | ICD-10-CM | POA: Diagnosis present

## 2014-08-17 DIAGNOSIS — R1010 Upper abdominal pain, unspecified: Secondary | ICD-10-CM | POA: Diagnosis not present

## 2014-08-17 DIAGNOSIS — R109 Unspecified abdominal pain: Secondary | ICD-10-CM

## 2014-08-17 DIAGNOSIS — M6281 Muscle weakness (generalized): Secondary | ICD-10-CM | POA: Diagnosis not present

## 2014-08-17 DIAGNOSIS — F0150 Vascular dementia without behavioral disturbance: Secondary | ICD-10-CM | POA: Diagnosis not present

## 2014-08-17 DIAGNOSIS — Z8521 Personal history of malignant neoplasm of larynx: Secondary | ICD-10-CM

## 2014-08-17 DIAGNOSIS — R059 Cough, unspecified: Secondary | ICD-10-CM | POA: Diagnosis present

## 2014-08-17 DIAGNOSIS — J1289 Other viral pneumonia: Secondary | ICD-10-CM | POA: Diagnosis not present

## 2014-08-17 DIAGNOSIS — G8929 Other chronic pain: Secondary | ICD-10-CM | POA: Diagnosis not present

## 2014-08-17 DIAGNOSIS — S00522A Blister (nonthermal) of oral cavity, initial encounter: Secondary | ICD-10-CM | POA: Diagnosis not present

## 2014-08-17 DIAGNOSIS — N400 Enlarged prostate without lower urinary tract symptoms: Secondary | ICD-10-CM | POA: Diagnosis not present

## 2014-08-17 HISTORY — DX: Type 2 diabetes mellitus without complications: E11.9

## 2014-08-17 HISTORY — DX: Unspecified diastolic (congestive) heart failure: I50.30

## 2014-08-17 LAB — URINALYSIS, ROUTINE W REFLEX MICROSCOPIC
Bilirubin Urine: NEGATIVE
Glucose, UA: 250 mg/dL — AB
Ketones, ur: NEGATIVE mg/dL
Leukocytes, UA: NEGATIVE
Nitrite: NEGATIVE
Protein, ur: 100 mg/dL — AB
SPECIFIC GRAVITY, URINE: 1.008 (ref 1.005–1.030)
Urobilinogen, UA: 0.2 mg/dL (ref 0.0–1.0)
pH: 7 (ref 5.0–8.0)

## 2014-08-17 LAB — COMPREHENSIVE METABOLIC PANEL
ALT: 12 U/L — AB (ref 17–63)
ANION GAP: 16 — AB (ref 5–15)
AST: 27 U/L (ref 15–41)
Albumin: 3.9 g/dL (ref 3.5–5.0)
Alkaline Phosphatase: 87 U/L (ref 38–126)
BUN: 32 mg/dL — ABNORMAL HIGH (ref 6–20)
CALCIUM: 8.7 mg/dL — AB (ref 8.9–10.3)
CO2: 24 mmol/L (ref 22–32)
CREATININE: 1.72 mg/dL — AB (ref 0.61–1.24)
Chloride: 97 mmol/L — ABNORMAL LOW (ref 101–111)
GFR, EST AFRICAN AMERICAN: 38 mL/min — AB (ref >60)
GFR, EST NON AFRICAN AMERICAN: 33 mL/min — AB (ref >60)
GLUCOSE: 289 mg/dL — AB (ref 65–99)
Potassium: 3.1 mmol/L — ABNORMAL LOW (ref 3.5–5.1)
Sodium: 137 mmol/L (ref 135–145)
TOTAL PROTEIN: 7.9 g/dL (ref 6.5–8.1)
Total Bilirubin: 0.7 mg/dL (ref 0.3–1.2)

## 2014-08-17 LAB — CBC WITH DIFFERENTIAL/PLATELET
BASOS PCT: 0.3 % (ref 0.0–3.0)
Basophils Absolute: 0 10*3/uL (ref 0.0–0.1)
Basophils Absolute: 0 10*3/uL (ref 0.0–0.1)
Basophils Relative: 0 % (ref 0–1)
EOS ABS: 0.1 10*3/uL (ref 0.0–0.7)
EOS PCT: 0 % (ref 0–5)
Eosinophils Absolute: 0 10*3/uL (ref 0.0–0.7)
Eosinophils Relative: 1 % (ref 0.0–5.0)
HCT: 37.9 % — ABNORMAL LOW (ref 39.0–52.0)
HCT: 36.6 % — ABNORMAL LOW (ref 39.0–52.0)
Hemoglobin: 12.7 g/dL — ABNORMAL LOW (ref 13.0–17.0)
Hemoglobin: 11.9 g/dL — ABNORMAL LOW (ref 13.0–17.0)
LYMPHS PCT: 13.4 % (ref 12.0–46.0)
LYMPHS PCT: 7 % — AB (ref 12–46)
Lymphs Abs: 1.1 10*3/uL (ref 0.7–4.0)
Lymphs Abs: 0.5 10*3/uL — ABNORMAL LOW (ref 0.7–4.0)
MCH: 31.2 pg (ref 26.0–34.0)
MCHC: 33.6 g/dL (ref 30.0–36.0)
MCHC: 32.5 g/dL (ref 30.0–36.0)
MCV: 94.1 fl (ref 78.0–100.0)
MCV: 95.8 fL (ref 78.0–100.0)
MONO ABS: 0.2 10*3/uL (ref 0.1–1.0)
Monocytes Absolute: 0.5 10*3/uL (ref 0.1–1.0)
Monocytes Relative: 6.3 % (ref 3.0–12.0)
Monocytes Relative: 3 % (ref 3–12)
Neutro Abs: 6.5 10*3/uL (ref 1.4–7.7)
Neutro Abs: 7.1 10*3/uL (ref 1.7–7.7)
Neutrophils Relative %: 79 % — ABNORMAL HIGH (ref 43.0–77.0)
Neutrophils Relative %: 90 % — ABNORMAL HIGH (ref 43–77)
Platelets: 226 10*3/uL (ref 150.0–400.0)
Platelets: 198 10*3/uL (ref 150–400)
RBC: 4.03 Mil/uL — AB (ref 4.22–5.81)
RBC: 3.82 MIL/uL — ABNORMAL LOW (ref 4.22–5.81)
RDW: 14.1 % (ref 11.5–15.5)
RDW: 13.3 % (ref 11.5–15.5)
WBC: 8.2 10*3/uL (ref 4.0–10.5)
WBC: 7.8 10*3/uL (ref 4.0–10.5)

## 2014-08-17 LAB — URINE MICROSCOPIC-ADD ON

## 2014-08-17 LAB — LIPASE, BLOOD: Lipase: 23 U/L (ref 22–51)

## 2014-08-17 LAB — HEMOGLOBIN A1C: Hgb A1c MFr Bld: 7.2 % — ABNORMAL HIGH (ref 4.6–6.5)

## 2014-08-17 MED ORDER — ONDANSETRON HCL 4 MG/2ML IJ SOLN
4.0000 mg | Freq: Once | INTRAMUSCULAR | Status: AC
  Administered 2014-08-17: 4 mg via INTRAVENOUS
  Filled 2014-08-17: qty 2

## 2014-08-17 MED ORDER — SODIUM CHLORIDE 0.9 % IV SOLN
INTRAVENOUS | Status: DC
  Administered 2014-08-17: 22:00:00 via INTRAVENOUS

## 2014-08-17 MED ORDER — POTASSIUM CHLORIDE CRYS ER 20 MEQ PO TBCR
40.0000 meq | EXTENDED_RELEASE_TABLET | Freq: Once | ORAL | Status: AC
  Administered 2014-08-17: 40 meq via ORAL
  Filled 2014-08-17: qty 2

## 2014-08-17 MED ORDER — PREDNISONE 10 MG PO TABS: ORAL_TABLET | ORAL | Status: DC

## 2014-08-17 MED ORDER — LEVOFLOXACIN 500 MG PO TABS: 500.0000 mg | ORAL_TABLET | Freq: Every day | ORAL | Status: DC

## 2014-08-17 MED ORDER — LIDOCAINE HCL 2 % EX GEL
1.0000 "application " | Freq: Once | CUTANEOUS | Status: DC
  Filled 2014-08-17: qty 10

## 2014-08-17 MED ORDER — SODIUM CHLORIDE 0.9 % IV BOLUS (SEPSIS)
500.0000 mL | Freq: Once | INTRAVENOUS | Status: AC
  Administered 2014-08-17: 500 mL via INTRAVENOUS

## 2014-08-17 NOTE — ED Notes (Signed)
Patients spouse reports on last Monday patient developed a congested cough. On Friday the cough got worse with fever (99.7 oral). On Sunday, patient became weaker with loss of appetite. Pt was seen this morning with PCP for symptoms. Chest x-ray performed and blood samples (CBC abd A1c) taken. No results at this time. Dx with acute bronchitis. This afternoon, pt started vomiting and having episode of diarrhea.

## 2014-08-17 NOTE — Patient Instructions (Signed)
  Your next office appointment will be determined based upon review of your pending labs  and  xrays  Those written interpretation of the lab results and instructions will be transmitted to you by My Chart   Critical results will be called.   Followup as needed for any active or acute issue. Please report any significant change in your symptoms. 

## 2014-08-17 NOTE — ED Provider Notes (Signed)
CSN: [id number]     Arrival date & time 08/17/14  1958 History   First MD Initiated Contact with Patient 08/17/14 2119     Chief Complaint  Patient presents with  . Diarrhea  . Emesis  . Fever  . Cough     (Consider location/radiation/quality/duration/timing/severity/associated sxs/prior Treatment) HPI Comments: Patient here complaining of sudden onset of vomiting and diarrhea that began after patient took a dose of prednisone and Zithromax. Seen by his physician today and diagnosed with bronchitis. Review of the records show that he had a chest x-ray at 11 AM today which was read as negative. Patient is CBC which showed no leukocytosis. He denies any fever at home. Denies any urinary symptoms at this time. Diarrhea was characterized as loose stool and vomiting is nonbilious nonbloody. Has had similar symptoms in the past when he takes in about ex. Family notes decreased oral intake over the past 2 days. Denies any abdominal pain.  Patient is a 81 y.o. male presenting with diarrhea, vomiting, fever, and cough. The history is provided by the patient, the spouse and a relative.  Diarrhea Associated symptoms: fever and vomiting   Emesis Associated symptoms: diarrhea   Fever Associated symptoms: cough, diarrhea and vomiting   Cough Associated symptoms: fever     Past Medical History  Diagnosis Date  . Diabetes mellitus type II   . Hyperlipidemia   . Hypertension   . Osteoporosis   . Prostatic hypertrophy     benign  . OSA (obstructive sleep apnea)   . Rotator cuff syndrome     chronic  . Elevated PSA   . Hypothyroidism   . Nephrolithiasis   . Thyroid nodule   . GERD (gastroesophageal reflux disease)   . Colitis, Clostridium difficile     presumed 11/08  . Dementia   . Anemia     NOS  . Vitamin B 12 deficiency   . BRADYCARDIA 06/10/2009  . COLON CANCER, HX OF 09/20/2006  . HYPOTHYROIDISM 01/14/2007  . DIABETES MELLITUS, TYPE II 09/20/2006  . HYPERLIPIDEMIA 09/20/2006  .  ANEMIA-NOS 06/23/2008  . DEMENTIA 05/14/2007  . DEPRESSION 02/27/2007  . SLEEP APNEA, OBSTRUCTIVE 01/14/2007  . HYPERTENSION 09/20/2006  . GERD 01/14/2007  . OSTEOPOROSIS 10/08/2006  . PERIPHERAL EDEMA 09/03/2007  . RENAL INSUFFICIENCY 04/01/2010  . B12 deficiency 05/25/2010  . CLOSTRIDIUM DIFFICILE COLITIS 01/30/2007  . THYROID NODULE 01/14/2007  . CONSTIPATION, RECURRENT 01/06/2010  . BENIGN PROSTATIC HYPERTROPHY 10/08/2006  . ARTHRITIS 02/23/2007  . BACK PAIN, THORACIC REGION 06/04/2008  . COLONIC POLYPS, HX OF 10/08/2006  . NEPHROLITHIASIS, HX OF 01/14/2007  . Cancer of colon dx'd 1998  . Cancer of vocal cord dx'd 1998  . 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 DVT of lower limb, acute 01/12/2011  . Sleep apnea     CPAP dependent   Past Surgical History  Procedure Laterality Date  . Cholecystectomy  1999  . Inguinal herniorrhapy  1984  . Appendectomy  1998  . Colon resection  1998  . Skin cancer extraction      right ear-extensive scar  . Cataract extraction, bilateral    . Nm lexiscan myoview ltd  09/02/2013    Normal LV function and wall motion. EF 62%; rate dependent LBBB with Lexiscan, Low Risk fixed inferior defect suggestive of diaphragmatic attenuation and not infarct with normal wall motion.  . Transthoracic echocardiogram  08/03/2009    Normal LV size. Mild LVH. EF 60-65%. Gr 2 DD, mild MR.  . Esophagogastroduodenoscopy (  egd) with propofol N/A 12/19/2013    Procedure: ESOPHAGOGASTRODUODENOSCOPY (EGD) WITH PROPOFOL;  Surgeon: Inda Castle, MD;  Location: [facility];  Service: Endoscopy;  Laterality: N/A;  . Savory dilation N/A 12/19/2013    Procedure: SAVORY DILATION;  Surgeon: Inda Castle, MD;  Location: [facility];  Service: Endoscopy;  Laterality: N/A;  With Fluoroscopy  . Esophagogastroduodenoscopy N/A 12/30/2013    Procedure: ESOPHAGOGASTRODUODENOSCOPY (EGD);  Surgeon: Inda Castle, MD;  Location: [facility];  Service: Endoscopy;  Laterality: N/A;  . Savory  dilation N/A 12/30/2013    Procedure: SAVORY DILATION;  Surgeon: Inda Castle, MD;  Location: [facility];  Service: Endoscopy;  Laterality: N/A;   Family History  Problem Relation Age of Onset  . Diabetes Mother   . Stroke Father    History  Substance Use Topics  . Smoking status: Never Smoker   . Smokeless tobacco: Never Used  . Alcohol Use: No    Review of Systems  Constitutional: Positive for fever.  Respiratory: Positive for cough.   Gastrointestinal: Positive for vomiting and diarrhea.  All other systems reviewed and are negative.     Allergies  Biaxin; Other; and Sulfa antibiotics  Home Medications   Prior to Admission medications   Medication Sig Start Date End Date Taking? Authorizing Provider  bacitracin ophthalmic ointment Place into both eyes 4 (four) times daily. 02/10/14  Yes Biagio Borg, MD  cholecalciferol (VITAMIN D) 1000 UNITS tablet Take 1,000 Units by mouth every morning.    Yes Historical Provider, MD  citalopram (CELEXA) 20 MG tablet Take 1 tablet (20 mg total) by mouth every morning. 02/03/14  Yes Biagio Borg, MD  furosemide (LASIX) 80 MG tablet Take 1 tablet (80 mg total) by mouth 2 (two) times daily. 02/03/14  Yes Biagio Borg, MD  glucose blood test strip Use as directed once daily to check blood sugar.  Diagnosis code E11.9 08/07/14  Yes Biagio Borg, MD  hydrALAZINE (APRESOLINE) 25 MG tablet Take 1 tablet (25 mg total) by mouth 2 (two) times daily. Patient taking differently: Take 25 mg by mouth 2 (two) times daily. Takes 2 tablets 2 times daily 02/03/14  Yes Biagio Borg, MD  isosorbide mononitrate (IMDUR) 30 MG 24 hr tablet Take 1 tablet (30 mg total) by mouth every morning. 02/03/14  Yes Biagio Borg, MD  lansoprazole (PREVACID) 30 MG capsule Take 1 capsule (30 mg total) by mouth every morning. 02/03/14  Yes Biagio Borg, MD  levofloxacin (LEVAQUIN) 500 MG tablet Take 1 tablet (500 mg total) by mouth daily. 08/17/14  Yes Hendricks Limes, MD   levothyroxine (SYNTHROID, LEVOTHROID) 100 MCG tablet Take 1 tablet (100 mcg total) by mouth daily before breakfast. 02/03/14  Yes Biagio Borg, MD  lipase/protease/amylase (CREON) 12000 UNITS CPEP capsule Take 1 capsule (12,000 Units total) by mouth 3 (three) times daily with meals. 02/03/14  Yes Biagio Borg, MD  OLANZapine (ZYPREXA) 5 MG tablet Take 1 tablet (5 mg total) by mouth every morning. 02/03/14  Yes Biagio Borg, MD  predniSONE (DELTASONE) 10 MG tablet 1 tid pc 08/17/14  Yes Hendricks Limes, MD  QUEtiapine (SEROQUEL) 50 MG tablet TAKE 1 TABLET (50 MG TOTAL) BY MOUTH AT BEDTIME. 07/13/14  Yes Biagio Borg, MD  sitaGLIPtin (JANUVIA) 50 MG tablet Take 1 tablet (50 mg total) by mouth every morning. 02/03/14  Yes Biagio Borg, MD  tamsulosin (FLOMAX) 0.4 MG CAPS capsule Take 1 capsule (  0.4 mg total) by mouth at bedtime. 02/03/14  Yes Biagio Borg, MD   BP 209/72 mmHg  Pulse 55  Temp(Src) 98.6 F (37 C) (Rectal)  Resp 19  Ht 6' (1.829 m)  Wt 163 lb (73.936 kg)  BMI 22.10 kg/m2  SpO2 95% Physical Exam  Constitutional: He is oriented to person, place, and time. He appears well-developed and well-nourished.  Non-toxic appearance. No distress.  HENT:  Head: Normocephalic and atraumatic.  Eyes: Conjunctivae, EOM and lids are normal. Pupils are equal, round, and reactive to light.  Neck: Normal range of motion. Neck supple. No tracheal deviation present. No thyroid mass present.  Cardiovascular: Normal rate, regular rhythm and normal heart sounds.  Exam reveals no gallop.   No murmur heard. Pulmonary/Chest: Effort normal and breath sounds normal. No stridor. No respiratory distress. He has no decreased breath sounds. He has no wheezes. He has no rhonchi. He has no rales.  Abdominal: Soft. Normal appearance and bowel sounds are normal. He exhibits no distension. There is no tenderness. There is no rebound and no CVA tenderness.  Musculoskeletal: Normal range of motion. He exhibits no edema or  tenderness.  Neurological: He is alert and oriented to person, place, and time. He has normal strength. No cranial nerve deficit or sensory deficit. GCS eye subscore is 4. GCS verbal subscore is 5. GCS motor subscore is 6.  Skin: Skin is warm and dry. No abrasion and no rash noted.  Psychiatric: He has a normal mood and affect. His speech is normal and behavior is normal.  Nursing note and vitals reviewed.   ED Course  Procedures (including critical care time) Labs Review Labs Reviewed  URINE CULTURE  CBC WITH DIFFERENTIAL/PLATELET  COMPREHENSIVE METABOLIC PANEL  URINALYSIS, ROUTINE W REFLEX MICROSCOPIC (NOT AT [facility])    Imaging Review Dg Chest 2 View  08/17/2014   CLINICAL DATA:  81 year old diabetic hypertensive male with cough and congestion with shortness breath and chest pain for the past week. Initial encounter.  EXAM: CHEST  2 VIEW  COMPARISON:  05/24/2013 and 07/10/2012.  FINDINGS: Biapical pleural thickening without associated bony destruction similar to prior exam.  No infiltrate, congestive heart failure or pneumothorax.  Calcified mildly tortuous aorta.  Heart size top-normal.  IMPRESSION: No active cardiopulmonary disease.  Please see above.   Electronically Signed   By: Genia Del M.D.   On: 08/17/2014 11:14     EKG Interpretation None      MDM   Final diagnoses:  None    patient given IV fluids anti-medics here. Continues to still endorse weakness. We'll admit for observation    Lacretia Leigh, MD 08/17/14 2327

## 2014-08-17 NOTE — Progress Notes (Signed)
   Subjective:    Patient ID: Nathan Blackwell, male    DOB: 04-10-1920, 81 y.o.   MRN: [id number]  HPI He has had a cough for approximately a week and fever the last 48 hours. His wife has been giving him over-the-counter cough syrup with minimal response. The cough is nonproductive but rattly. He had associated sneezing as well as some watery eyes. She's noted some intermittent green discharge from his eyes. He has been seen by an ophthalmologist from [facility] who has prescribed anabiotic cream which he uses for infection of the external eye. Temperature was 99.7 max on 6/11.   He denies upper respiratory tract infection symptoms.  He cannot remember his blood glucose this morning. His last A1c on record was 7.2% on 05/05/14.   Review of Systems Frontal headache, facial pain , nasal purulence, dental pain, sore throat , otic pain or otic discharge denied. No fever , chills or sweats. New progress he denies wheezing.    Objective:   Physical Exam Pertinent or positive findings include:Arcus senilis is present . There is an accumulation of cream most notable over the right upper and lower lids. There is minimal erythema of the conjunctiva without frank conjunctivitis. Extraocular motion is intact. He has rhonchi which are somewhat asymmetric, loudest over the right lower lobe. These are also audible over the upper airway. Pedals pulses are surprisingly good. He is wearing support hose.  General appearance :Appears chronically ill but adequately nourished; in no distress.  Eyes: No scleral icterus is present.  Oral exam:  Lips and gums are healthy appearing.There is no oropharyngeal erythema or exudate noted. Dental hygiene is good.  Heart:  Normal rate and regular rhythm. S1 and S2 normal without gallop, murmur, click, rub or other extra sounds    Lungs:No increased work of breathing.   Abdomen: bowel sounds normal, soft and non-tender without masses, organomegaly or hernias noted.  No  guarding or rebound.   Vascular : all pulses equal ; no bruits present.  Skin:Warm & dry.  Intact without suspicious lesions or rashes ; no tenting or jaundice   Lymphatic: No lymphadenopathy is noted about the head, neck, axilla   Neuro: Strength, tone decreased       Assessment & Plan:  #1 acute bronchitis with bronchospasm. Rule out pneumonia  #2 Diabetes, questionable status  Plan: See orders/ recommendations

## 2014-08-17 NOTE — Progress Notes (Signed)
Pre visit review using our clinic review tool, if applicable. No additional management support is needed unless otherwise documented below in the visit note. 

## 2014-08-17 NOTE — H&P (Addendum)
[facility] History and Physical  Nathan Blackwell NTZ:[id number] DOB: 24-Feb-1921 DOA: 08/17/2014  Referring physician: ED physician PCP: [name], MD  Specialists:   Chief Complaint: Cough, fever, nausea, vomiting, diarrhea  HPI: Nathan Blackwell is a 81 y.o. male with PMH of hypertension, diabetes mellitus, diastolic congestive heart failure (EF 60-65% with grade 2 diastolic dysfunction), GERD, hypothyroidism, depression, OSA, CKD-III, history of C. difficile colitis, BPH, chronic back pain, remote history of colon cancer 1998, remote history of vocal cord cancer 1998, history of DVT 2012 (completed anticoagulant treatment), who presents with cough, nausea, vomiting, diarrhea.  The patient reports that he has been having cough for about one week. He has fever with temperature 99.7 on Sunday, he does not have chest pain or shortness of breath. He was seen by his PCP and had a negative chest x-ray, was diagnosed as bronchitis. He was given prescription of prednisone and Levaquin. After he took those 2 medications, he developed nausea, vomiting and diarrhea today. He vomited more than 10 times without blood in the vomitus. He had 4-5 times of watery diarrhea, no blood in the stools. He does not have abdominal pain.  Currently patient denies running nose, ear pain, headaches, chest pain, SOB, abdominal pain, dysuria, urgency, frequency, hematuria, skin rashes. No unilateral weakness, numbness or tingling sensations. No vision change or hearing loss.   In ED, patient was found to have WBC 7.8, lipase 23, negative urinalysis, temperature 97.7, stable renal function. Chest x-ray negative for acute abnormalities. X-ray of the chest/abdomen negative for acute abnormalities. Patient is admitted to inpatient for further evaluation and treatment.  Where does patient live?   At home    Can patient participate in ADLs?  Barely    Review of Systems:   General: had fevers, chills, has poor appetite, has  fatigue HEENT: no blurry vision, hearing changes or sore throat Pulm: no dyspnea, has coughing, no wheezing CV: no chest pain, palpitations Abd: has nausea, vomiting, diarrhea, no constipation and abdominal pain, GU: no dysuria, burning on urination, increased urinary frequency, hematuria  Ext: has chronic leg edema Neuro: no unilateral weakness, numbness, or tingling, no vision change or hearing loss Skin: no rash MSK: No muscle spasm, no deformity, no limitation of range of movement in spin Heme: No easy bruising.  Travel history: No recent long distant travel.  Allergy:  Allergies  Allergen Reactions  . Biaxin [Clarithromycin] Other (See Comments)    Unknown   . Other Other (See Comments)    Nephrologist has told patient not to eat many different foods (tomatoes, [employer], etc).   . Sulfa Antibiotics Other (See Comments)    Unknown     Past Medical History  Diagnosis Date  . Diabetes mellitus type II   . Hyperlipidemia   . Hypertension   . Osteoporosis   . Prostatic hypertrophy     benign  . OSA (obstructive sleep apnea)   . Rotator cuff syndrome     chronic  . Elevated PSA   . Hypothyroidism   . Nephrolithiasis   . Thyroid nodule   . GERD (gastroesophageal reflux disease)   . Colitis, Clostridium difficile     presumed 11/08  . Dementia   . Anemia     NOS  . Vitamin B 12 deficiency   . BRADYCARDIA 06/10/2009  . COLON CANCER, HX OF 09/20/2006  . HYPOTHYROIDISM 01/14/2007  . DIABETES MELLITUS, TYPE II 09/20/2006  . HYPERLIPIDEMIA 09/20/2006  . ANEMIA-NOS 06/23/2008  . DEMENTIA  05/14/2007  . DEPRESSION 02/27/2007  . SLEEP APNEA, OBSTRUCTIVE 01/14/2007  . HYPERTENSION 09/20/2006  . GERD 01/14/2007  . OSTEOPOROSIS 10/08/2006  . PERIPHERAL EDEMA 09/03/2007  . RENAL INSUFFICIENCY 04/01/2010  . B12 deficiency 05/25/2010  . CLOSTRIDIUM DIFFICILE COLITIS 01/30/2007  . THYROID NODULE 01/14/2007  . CONSTIPATION, RECURRENT 01/06/2010  . BENIGN PROSTATIC HYPERTROPHY  10/08/2006  . ARTHRITIS 02/23/2007  . BACK PAIN, THORACIC REGION 06/04/2008  . COLONIC POLYPS, HX OF 10/08/2006  . NEPHROLITHIASIS, HX OF 01/14/2007  . Cancer of colon dx'd 1998  . Cancer of vocal cord dx'd 1998  . 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 DVT of lower limb, acute 01/12/2011  . Sleep apnea     CPAP dependent  . Diastolic congestive heart failure   . Hypothyroidism   . Diabetes mellitus     Past Surgical History  Procedure Laterality Date  . Cholecystectomy  1999  . Inguinal herniorrhapy  1984  . Appendectomy  1998  . Colon resection  1998  . Skin cancer extraction      right ear-extensive scar  . Cataract extraction, bilateral    . Nm lexiscan myoview ltd  09/02/2013    Normal LV function and wall motion. EF 62%; rate dependent LBBB with Lexiscan, Low Risk fixed inferior defect suggestive of diaphragmatic attenuation and not infarct with normal wall motion.  . Transthoracic echocardiogram  08/03/2009    Normal LV size. Mild LVH. EF 60-65%. Gr 2 DD, mild MR.  . Esophagogastroduodenoscopy (egd) with propofol N/A 12/19/2013    Procedure: ESOPHAGOGASTRODUODENOSCOPY (EGD) WITH PROPOFOL;  Surgeon: Inda Castle, MD;  Location: [facility];  Service: Endoscopy;  Laterality: N/A;  . Savory dilation N/A 12/19/2013    Procedure: SAVORY DILATION;  Surgeon: Inda Castle, MD;  Location: [facility];  Service: Endoscopy;  Laterality: N/A;  With Fluoroscopy  . Esophagogastroduodenoscopy N/A 12/30/2013    Procedure: ESOPHAGOGASTRODUODENOSCOPY (EGD);  Surgeon: Inda Castle, MD;  Location: [facility];  Service: Endoscopy;  Laterality: N/A;  . Savory dilation N/A 12/30/2013    Procedure: SAVORY DILATION;  Surgeon: Inda Castle, MD;  Location: [facility];  Service: Endoscopy;  Laterality: N/A;    Social History:  reports that he has never smoked. He has never used smokeless tobacco. He reports that he does not drink alcohol or use illicit drugs.  Family History:  Family  History  Problem Relation Age of Onset  . Diabetes Mother   . Stroke Father      Prior to Admission medications   Medication Sig Start Date End Date Taking? Authorizing Provider  bacitracin ophthalmic ointment Place into both eyes 4 (four) times daily. 02/10/14  Yes Biagio Borg, MD  cholecalciferol (VITAMIN D) 1000 UNITS tablet Take 1,000 Units by mouth every morning.    Yes Historical Provider, MD  citalopram (CELEXA) 20 MG tablet Take 1 tablet (20 mg total) by mouth every morning. 02/03/14  Yes Biagio Borg, MD  furosemide (LASIX) 80 MG tablet Take 1 tablet (80 mg total) by mouth 2 (two) times daily. 02/03/14  Yes Biagio Borg, MD  glucose blood test strip Use as directed once daily to check blood sugar.  Diagnosis code E11.9 08/07/14  Yes Biagio Borg, MD  hydrALAZINE (APRESOLINE) 25 MG tablet Take 1 tablet (25 mg total) by mouth 2 (two) times daily. Patient taking differently: Take 25 mg by mouth 2 (two) times daily. Takes 2 tablets 2 times daily 02/03/14  Yes Biagio Borg, MD  isosorbide mononitrate (IMDUR) 30 MG 24 hr tablet Take 1 tablet (30 mg total) by mouth every morning. 02/03/14  Yes Biagio Borg, MD  lansoprazole (PREVACID) 30 MG capsule Take 1 capsule (30 mg total) by mouth every morning. 02/03/14  Yes Biagio Borg, MD  levofloxacin (LEVAQUIN) 500 MG tablet Take 1 tablet (500 mg total) by mouth daily. 08/17/14  Yes Hendricks Limes, MD  levothyroxine (SYNTHROID, LEVOTHROID) 100 MCG tablet Take 1 tablet (100 mcg total) by mouth daily before breakfast. 02/03/14  Yes Biagio Borg, MD  lipase/protease/amylase (CREON) 12000 UNITS CPEP capsule Take 1 capsule (12,000 Units total) by mouth 3 (three) times daily with meals. 02/03/14  Yes Biagio Borg, MD  OLANZapine (ZYPREXA) 5 MG tablet Take 1 tablet (5 mg total) by mouth every morning. 02/03/14  Yes Biagio Borg, MD  predniSONE (DELTASONE) 10 MG tablet 1 tid pc 08/17/14  Yes Hendricks Limes, MD  QUEtiapine (SEROQUEL) 50 MG tablet TAKE 1 TABLET (50  MG TOTAL) BY MOUTH AT BEDTIME. 07/13/14  Yes Biagio Borg, MD  sitaGLIPtin (JANUVIA) 50 MG tablet Take 1 tablet (50 mg total) by mouth every morning. 02/03/14  Yes Biagio Borg, MD  tamsulosin (FLOMAX) 0.4 MG CAPS capsule Take 1 capsule (0.4 mg total) by mouth at bedtime. 02/03/14  Yes Biagio Borg, MD    Physical Exam: Filed Vitals:   08/17/14 2024 08/17/14 2037 08/17/14 2234 08/18/14 0032  BP: 209/72  152/64 188/68  Pulse: 55  51 48  Temp:  98.6 F (37 C) 97.9 F (36.6 C) 97.6 F (36.4 C)  TempSrc: Oral Rectal Oral Oral  Resp: 19  14 16   Height: 6' (1.829 m)     Weight: 73.936 kg (163 lb)     SpO2: 95%  95% 96%   General: Not in acute distress HEENT:       Eyes: PERRL, EOMI, no scleral icterus.       ENT: No discharge from the ears and nose, no pharynx injection, no tonsillar enlargement.        Neck: No JVD, no bruit, no mass felt. Heme: No neck lymph node enlargement. Cardiac: S1/S2, RRR, No murmurs, No gallops or rubs. Pulm: diffuse rhonchi bilaterally, No rales, wheezing, or rubs. Abd: Soft, nondistended, nontender, no rebound pain, no organomegaly, BS present. Ext: 1+ pitting leg edema bilaterally (R is little worse than the left, chronic per his wife). 2+DP/PT pulse bilaterally. Musculoskeletal: No joint deformities, No joint redness or warmth, no limitation of ROM in spin. Skin: No rashes.  Neuro: Alert, oriented X3, cranial nerves II-XII grossly intact, muscle strength 5/5 in all extremities, sensation to light touch intact.  Psych: Patient is not psychotic, no suicidal or hemocidal ideation.  Labs on Admission:  Basic Metabolic Panel:  Recent Labs Lab 08/17/14 2126  NA 137  K 3.1*  CL 97*  CO2 24  GLUCOSE 289*  BUN 32*  CREATININE 1.72*  CALCIUM 8.7*   Liver Function Tests:  Recent Labs Lab 08/17/14 2126  AST 27  ALT 12*  ALKPHOS 87  BILITOT 0.7  PROT 7.9  ALBUMIN 3.9    Recent Labs Lab 08/17/14 2145  LIPASE 23   No results for input(s):  AMMONIA in the last 168 hours. CBC:  Recent Labs Lab 08/17/14 1108 08/17/14 2126  WBC 8.2 7.8  NEUTROABS 6.5 7.1  HGB 12.7* 11.9*  HCT 37.9* 36.6*  MCV 94.1 95.8  PLT 226.0 198   Cardiac Enzymes:  No results for input(s): CKTOTAL, CKMB, CKMBINDEX, TROPONINI in the last 168 hours.  BNP (last 3 results) No results for input(s): BNP in the last 8760 hours.  ProBNP (last 3 results) No results for input(s): PROBNP in the last 8760 hours.  CBG: No results for input(s): GLUCAP in the last 168 hours.  Radiological Exams on Admission: Dg Chest 2 View  08/17/2014   CLINICAL DATA:  81 year old diabetic hypertensive male with cough and congestion with shortness breath and chest pain for the past week. Initial encounter.  EXAM: CHEST  2 VIEW  COMPARISON:  05/24/2013 and 07/10/2012.  FINDINGS: Biapical pleural thickening without associated bony destruction similar to prior exam.  No infiltrate, congestive heart failure or pneumothorax.  Calcified mildly tortuous aorta.  Heart size top-normal.  IMPRESSION: No active cardiopulmonary disease.  Please see above.   Electronically Signed   By: Genia Del M.D.   On: 08/17/2014 11:14   Dg Abd Acute W/chest  08/17/2014   CLINICAL DATA:  Nausea and upper abdominal pain for 24 hours.  EXAM: DG ABDOMEN ACUTE W/ 1V CHEST  COMPARISON:  Chest 08/17/2014  FINDINGS: Normal heart size and pulmonary vascularity. No focal airspace disease or consolidation in the lungs. No blunting of costophrenic angles. No pneumothorax. Mediastinal contours appear intact.  Scattered gas and stool in the colon. No small or large bowel distention. No free intra-abdominal air. No abnormal air-fluid levels. No radiopaque stones. Visualized bones appear intact. Surgical clips in the right upper quadrant.  IMPRESSION: No evidence of active pulmonary disease. Normal nonobstructive bowel gas pattern.   Electronically Signed   By: Lucienne Capers M.D.   On: 08/17/2014 22:02    EKG:   Not  done in ED, will get one.   Assessment/Plan Principal Problem:   Cough Active Problems:   Hypothyroidism   Diabetes   Depression   OSA (obstructive sleep apnea)   Essential hypertension   GERD   Stage 3 chronic renal impairment associated with type 2 diabetes mellitus   H/O Deep venous thrombosis - left femoral vein   CKD (chronic kidney disease), stage III   Dysphagia, pharyngoesophageal phase   Weakness   Nausea vomiting and diarrhea   Hypokalemia   Diastolic congestive heart failure  Cough: Likely due to bronchitis. Chest x-ray is negative for infiltration. Does not have chest pain or shortness of breath. Patient reports that he developed nausea, vomiting and diarrhea after he took prednisone and Levaquin today, not sure whether he is allergic to those 2 medications or he coincidently developed gastroenteritis. Patient is not septic on admission. Patient has diffused rhonchi on auscultation. Given his history of dysphagia, may have aspiration.  -will admit patient to telemetry bed  -Nebulizers: Duoneb prn q6h -Oral doxycyline for 5 days.  -Mucinex for cough  -Blood and sputum culture, respiratory viral panel -Urine legionella and S. pneumococcal antigen -Follow up blood culture x2, sputum culture, respiratory virus panel -will get Procalcitonin and trend lactic acid level -IVF: 0.5 L of NS bolus in ED, followed by 50 cc/h -NPO and SLP  Nausea, vomiting, diarrhea: Likely due to viral gastroenteritis. But need to rule out other possibilities, such as C. difficile colitis. -Check C diff pcr and GI path panel -IVF: 0.5 L of NS bolus in ED, followed by 50 cc/h (patient has diastolic congestive heart failure, litting aggressive IV fluid treatment). -Hydroxyzine for nausea  Diastolic congestive heart failure: 2-D echo on 08/03/09 showed EF 60 to 65% with grade 2 diastolic dysfunction.  Patient has mild leg edema, but CHF seems to be compensated. -Hold her Lasix -Check  BNP  Hypothyroidism: Last TSH was 0.62 on 05/05/14. -Continue home Synthroid -Check TSH  DM-II: Last A1c 7.2,  well controled. Patient is taking Januvia at home -SSI  Depression and anxiety: Stable, no suicidal or homicidal ideations. -Continue home medications: Celexa, olanzapine, Seroquel  OSA: -CPAP  GERD: -Switch PPI to Pepcid on to C. difficile PCR results comes back negative  Stage 3 chronic renal impairment associated with type 2 diabetes mellitus: Renal function stable. Baseline creatinine 1.7, his creatinine is 1.72 on admission. -Follow-up renal function by BMP  Dysphagia, pharyngoesophageal phase: -NPO now -SLP  Hypokalemia: K= 3.1 on admission. - Repleted  DVT ppx: SQ Heparin         Code Status: Full code Family Communication: Yes, patient's daughter and the wife  at bed side Disposition Plan: Admit to inpatient   Date of Service 08/18/2014    Ivor Costa [facility] Pager [phone]  If 7PM-7AM, please contact night-coverage [email or web address] Password [facility] 08/18/2014, 12:43 AM

## 2014-08-17 NOTE — ED Notes (Signed)
Awaiting a new bed assignment. The bed assignment for 3rd floor can not accept telemetry patients. Informed admitting hospitalist of this information.

## 2014-08-18 ENCOUNTER — Encounter (HOSPITAL_COMMUNITY): Payer: Self-pay | Admitting: Internal Medicine

## 2014-08-18 DIAGNOSIS — E1122 Type 2 diabetes mellitus with diabetic chronic kidney disease: Secondary | ICD-10-CM | POA: Diagnosis present

## 2014-08-18 DIAGNOSIS — E039 Hypothyroidism, unspecified: Secondary | ICD-10-CM | POA: Diagnosis present

## 2014-08-18 DIAGNOSIS — I5031 Acute diastolic (congestive) heart failure: Secondary | ICD-10-CM | POA: Diagnosis not present

## 2014-08-18 DIAGNOSIS — J1289 Other viral pneumonia: Secondary | ICD-10-CM | POA: Diagnosis not present

## 2014-08-18 DIAGNOSIS — H1030 Unspecified acute conjunctivitis, unspecified eye: Secondary | ICD-10-CM | POA: Diagnosis not present

## 2014-08-18 DIAGNOSIS — I129 Hypertensive chronic kidney disease with stage 1 through stage 4 chronic kidney disease, or unspecified chronic kidney disease: Secondary | ICD-10-CM | POA: Diagnosis present

## 2014-08-18 DIAGNOSIS — J189 Pneumonia, unspecified organism: Secondary | ICD-10-CM | POA: Diagnosis not present

## 2014-08-18 DIAGNOSIS — D631 Anemia in chronic kidney disease: Secondary | ICD-10-CM | POA: Diagnosis present

## 2014-08-18 DIAGNOSIS — R197 Diarrhea, unspecified: Secondary | ICD-10-CM | POA: Diagnosis not present

## 2014-08-18 DIAGNOSIS — M6281 Muscle weakness (generalized): Secondary | ICD-10-CM | POA: Diagnosis not present

## 2014-08-18 DIAGNOSIS — I5030 Unspecified diastolic (congestive) heart failure: Secondary | ICD-10-CM | POA: Diagnosis not present

## 2014-08-18 DIAGNOSIS — E876 Hypokalemia: Secondary | ICD-10-CM | POA: Diagnosis present

## 2014-08-18 DIAGNOSIS — Z8521 Personal history of malignant neoplasm of larynx: Secondary | ICD-10-CM | POA: Diagnosis not present

## 2014-08-18 DIAGNOSIS — Z85828 Personal history of other malignant neoplasm of skin: Secondary | ICD-10-CM | POA: Diagnosis not present

## 2014-08-18 DIAGNOSIS — G4733 Obstructive sleep apnea (adult) (pediatric): Secondary | ICD-10-CM | POA: Diagnosis present

## 2014-08-18 DIAGNOSIS — G8929 Other chronic pain: Secondary | ICD-10-CM | POA: Diagnosis not present

## 2014-08-18 DIAGNOSIS — E785 Hyperlipidemia, unspecified: Secondary | ICD-10-CM | POA: Diagnosis present

## 2014-08-18 DIAGNOSIS — R05 Cough: Secondary | ICD-10-CM | POA: Diagnosis not present

## 2014-08-18 DIAGNOSIS — Z823 Family history of stroke: Secondary | ICD-10-CM | POA: Diagnosis not present

## 2014-08-18 DIAGNOSIS — I10 Essential (primary) hypertension: Secondary | ICD-10-CM | POA: Diagnosis not present

## 2014-08-18 DIAGNOSIS — K219 Gastro-esophageal reflux disease without esophagitis: Secondary | ICD-10-CM | POA: Diagnosis present

## 2014-08-18 DIAGNOSIS — J209 Acute bronchitis, unspecified: Secondary | ICD-10-CM | POA: Diagnosis not present

## 2014-08-18 DIAGNOSIS — E569 Vitamin deficiency, unspecified: Secondary | ICD-10-CM | POA: Diagnosis not present

## 2014-08-18 DIAGNOSIS — F419 Anxiety disorder, unspecified: Secondary | ICD-10-CM | POA: Diagnosis present

## 2014-08-18 DIAGNOSIS — E119 Type 2 diabetes mellitus without complications: Secondary | ICD-10-CM | POA: Diagnosis not present

## 2014-08-18 DIAGNOSIS — F0150 Vascular dementia without behavioral disturbance: Secondary | ICD-10-CM | POA: Diagnosis not present

## 2014-08-18 DIAGNOSIS — F329 Major depressive disorder, single episode, unspecified: Secondary | ICD-10-CM | POA: Diagnosis not present

## 2014-08-18 DIAGNOSIS — J211 Acute bronchiolitis due to human metapneumovirus: Secondary | ICD-10-CM | POA: Diagnosis not present

## 2014-08-18 DIAGNOSIS — R278 Other lack of coordination: Secondary | ICD-10-CM | POA: Diagnosis not present

## 2014-08-18 DIAGNOSIS — N183 Chronic kidney disease, stage 3 (moderate): Secondary | ICD-10-CM | POA: Diagnosis not present

## 2014-08-18 DIAGNOSIS — J9601 Acute respiratory failure with hypoxia: Secondary | ICD-10-CM | POA: Diagnosis not present

## 2014-08-18 DIAGNOSIS — E860 Dehydration: Secondary | ICD-10-CM | POA: Diagnosis not present

## 2014-08-18 DIAGNOSIS — Z87442 Personal history of urinary calculi: Secondary | ICD-10-CM | POA: Diagnosis not present

## 2014-08-18 DIAGNOSIS — J129 Viral pneumonia, unspecified: Secondary | ICD-10-CM | POA: Diagnosis not present

## 2014-08-18 DIAGNOSIS — F0390 Unspecified dementia without behavioral disturbance: Secondary | ICD-10-CM | POA: Diagnosis present

## 2014-08-18 DIAGNOSIS — K5900 Constipation, unspecified: Secondary | ICD-10-CM | POA: Diagnosis not present

## 2014-08-18 DIAGNOSIS — R1314 Dysphagia, pharyngoesophageal phase: Secondary | ICD-10-CM | POA: Diagnosis present

## 2014-08-18 DIAGNOSIS — Z833 Family history of diabetes mellitus: Secondary | ICD-10-CM | POA: Diagnosis not present

## 2014-08-18 DIAGNOSIS — A084 Viral intestinal infection, unspecified: Secondary | ICD-10-CM | POA: Diagnosis present

## 2014-08-18 DIAGNOSIS — Z85038 Personal history of other malignant neoplasm of large intestine: Secondary | ICD-10-CM | POA: Diagnosis not present

## 2014-08-18 DIAGNOSIS — M810 Age-related osteoporosis without current pathological fracture: Secondary | ICD-10-CM | POA: Diagnosis present

## 2014-08-18 DIAGNOSIS — Z86718 Personal history of other venous thrombosis and embolism: Secondary | ICD-10-CM | POA: Diagnosis not present

## 2014-08-18 DIAGNOSIS — S00522A Blister (nonthermal) of oral cavity, initial encounter: Secondary | ICD-10-CM | POA: Diagnosis not present

## 2014-08-18 DIAGNOSIS — I5032 Chronic diastolic (congestive) heart failure: Secondary | ICD-10-CM | POA: Diagnosis present

## 2014-08-18 DIAGNOSIS — N400 Enlarged prostate without lower urinary tract symptoms: Secondary | ICD-10-CM | POA: Diagnosis not present

## 2014-08-18 DIAGNOSIS — R112 Nausea with vomiting, unspecified: Secondary | ICD-10-CM | POA: Diagnosis not present

## 2014-08-18 LAB — LACTIC ACID, PLASMA
LACTIC ACID, VENOUS: 1.5 mmol/L (ref 0.5–2.0)
LACTIC ACID, VENOUS: 1.2 mmol/L (ref 0.5–2.0)

## 2014-08-18 LAB — PROCALCITONIN: Procalcitonin: 0.13 ng/mL

## 2014-08-18 LAB — GLUCOSE, CAPILLARY
GLUCOSE-CAPILLARY: 94 mg/dL (ref 65–99)
Glucose-Capillary: 179 mg/dL — ABNORMAL HIGH (ref 65–99)
Glucose-Capillary: 120 mg/dL — ABNORMAL HIGH (ref 65–99)
Glucose-Capillary: 243 mg/dL — ABNORMAL HIGH (ref 65–99)

## 2014-08-18 LAB — MAGNESIUM: Magnesium: 1.7 mg/dL (ref 1.7–2.4)

## 2014-08-18 LAB — PROTIME-INR
INR: 1.12 (ref 0.00–1.49)
Prothrombin Time: 14.6 seconds (ref 11.6–15.2)

## 2014-08-18 LAB — APTT: aPTT: 29 seconds (ref 24–37)

## 2014-08-18 LAB — STREP PNEUMONIAE URINARY ANTIGEN: Strep Pneumo Urinary Antigen: NEGATIVE

## 2014-08-18 LAB — BRAIN NATRIURETIC PEPTIDE: B Natriuretic Peptide: 266.2 pg/mL — ABNORMAL HIGH (ref 0.0–100.0)

## 2014-08-18 MED ORDER — HYDRALAZINE HCL 25 MG PO TABS
25.0000 mg | ORAL_TABLET | Freq: Two times a day (BID) | ORAL | Status: DC
  Administered 2014-08-18 – 2014-08-20 (×5): 25 mg via ORAL
  Filled 2014-08-18 (×5): qty 1

## 2014-08-18 MED ORDER — CITALOPRAM HYDROBROMIDE 20 MG PO TABS
20.0000 mg | ORAL_TABLET | Freq: Every day | ORAL | Status: DC
  Administered 2014-08-18 – 2014-08-21 (×4): 20 mg via ORAL
  Filled 2014-08-18 (×4): qty 1

## 2014-08-18 MED ORDER — DOXYCYCLINE HYCLATE 100 MG PO TABS
100.0000 mg | ORAL_TABLET | Freq: Two times a day (BID) | ORAL | Status: DC
  Administered 2014-08-18 – 2014-08-20 (×5): 100 mg via ORAL
  Filled 2014-08-18 (×5): qty 1

## 2014-08-18 MED ORDER — QUETIAPINE FUMARATE 25 MG PO TABS
50.0000 mg | ORAL_TABLET | Freq: Every day | ORAL | Status: DC
  Administered 2014-08-18 – 2014-08-20 (×3): 50 mg via ORAL
  Filled 2014-08-18 (×3): qty 2

## 2014-08-18 MED ORDER — CETYLPYRIDINIUM CHLORIDE 0.05 % MT LIQD
7.0000 mL | Freq: Two times a day (BID) | OROMUCOSAL | Status: DC
  Administered 2014-08-18 – 2014-08-21 (×7): 7 mL via OROMUCOSAL

## 2014-08-18 MED ORDER — ISOSORBIDE MONONITRATE ER 30 MG PO TB24
30.0000 mg | ORAL_TABLET | Freq: Every day | ORAL | Status: DC
  Administered 2014-08-18 – 2014-08-21 (×4): 30 mg via ORAL
  Filled 2014-08-18 (×4): qty 1

## 2014-08-18 MED ORDER — VITAMIN D 1000 UNITS PO TABS
1000.0000 [IU] | ORAL_TABLET | Freq: Every day | ORAL | Status: DC
  Administered 2014-08-18 – 2014-08-21 (×4): 1000 [IU] via ORAL
  Filled 2014-08-18 (×4): qty 1

## 2014-08-18 MED ORDER — DM-GUAIFENESIN ER 30-600 MG PO TB12
1.0000 | ORAL_TABLET | Freq: Two times a day (BID) | ORAL | Status: DC
  Administered 2014-08-18 – 2014-08-21 (×7): 1 via ORAL
  Filled 2014-08-18 (×7): qty 1

## 2014-08-18 MED ORDER — LEVOTHYROXINE SODIUM 100 MCG PO TABS
100.0000 ug | ORAL_TABLET | Freq: Every day | ORAL | Status: DC
  Administered 2014-08-18 – 2014-08-21 (×4): 100 ug via ORAL
  Filled 2014-08-18 (×4): qty 1

## 2014-08-18 MED ORDER — HYDROXYZINE HCL 50 MG/ML IM SOLN
25.0000 mg | Freq: Four times a day (QID) | INTRAMUSCULAR | Status: DC | PRN
  Filled 2014-08-18: qty 0.5

## 2014-08-18 MED ORDER — HEPARIN SODIUM (PORCINE) 5000 UNIT/ML IJ SOLN
5000.0000 [IU] | Freq: Three times a day (TID) | INTRAMUSCULAR | Status: DC
  Administered 2014-08-18 – 2014-08-21 (×10): 5000 [IU] via SUBCUTANEOUS
  Filled 2014-08-18 (×10): qty 1

## 2014-08-18 MED ORDER — TAMSULOSIN HCL 0.4 MG PO CAPS
0.4000 mg | ORAL_CAPSULE | Freq: Every day | ORAL | Status: DC
  Administered 2014-08-18 – 2014-08-20 (×3): 0.4 mg via ORAL
  Filled 2014-08-18 (×3): qty 1

## 2014-08-18 MED ORDER — IPRATROPIUM-ALBUTEROL 0.5-2.5 (3) MG/3ML IN SOLN: 3.0000 mL | Freq: Four times a day (QID) | RESPIRATORY_TRACT | Status: DC | PRN

## 2014-08-18 MED ORDER — INSULIN ASPART 100 UNIT/ML ~~LOC~~ SOLN
0.0000 [IU] | Freq: Three times a day (TID) | SUBCUTANEOUS | Status: DC
  Administered 2014-08-18: 3 [IU] via SUBCUTANEOUS
  Administered 2014-08-18 – 2014-08-19 (×2): 2 [IU] via SUBCUTANEOUS
  Administered 2014-08-20: 1 [IU] via SUBCUTANEOUS
  Administered 2014-08-20 – 2014-08-21 (×2): 2 [IU] via SUBCUTANEOUS

## 2014-08-18 MED ORDER — PANCRELIPASE (LIP-PROT-AMYL) 12000-38000 UNITS PO CPEP
12000.0000 [IU] | ORAL_CAPSULE | Freq: Three times a day (TID) | ORAL | Status: DC
  Administered 2014-08-18 – 2014-08-21 (×8): 12000 [IU] via ORAL
  Filled 2014-08-18 (×14): qty 1

## 2014-08-18 MED ORDER — FAMOTIDINE 20 MG PO TABS
20.0000 mg | ORAL_TABLET | Freq: Two times a day (BID) | ORAL | Status: DC
  Administered 2014-08-18 – 2014-08-21 (×7): 20 mg via ORAL
  Filled 2014-08-18 (×7): qty 1

## 2014-08-18 MED ORDER — SODIUM CHLORIDE 0.9 % IV SOLN
INTRAVENOUS | Status: DC
  Administered 2014-08-18 (×2): via INTRAVENOUS

## 2014-08-18 MED ORDER — OLANZAPINE 5 MG PO TABS
5.0000 mg | ORAL_TABLET | Freq: Every day | ORAL | Status: DC
  Administered 2014-08-18 – 2014-08-21 (×4): 5 mg via ORAL
  Filled 2014-08-18 (×4): qty 1

## 2014-08-18 MED ORDER — HYDRALAZINE HCL 20 MG/ML IJ SOLN
5.0000 mg | INTRAMUSCULAR | Status: DC | PRN
  Administered 2014-08-18 – 2014-08-20 (×3): 5 mg via INTRAVENOUS
  Filled 2014-08-18 (×3): qty 1

## 2014-08-18 NOTE — Progress Notes (Signed)
Pt. Refuses CPAP at this time. Pt. Is aware to inform RT or RN anytime during the night if he changes his mind & decides to wear CPAP. RN is aware.

## 2014-08-18 NOTE — Evaluation (Signed)
Physical Therapy Evaluation Patient Details Name: Nathan Blackwell MRN: [id number] DOB: Sep 04, 1920 Today's Date: 08/18/2014   History of Present Illness  This 81 year old man was admitted with cough, fever, nausea, vomiting, diarhea.  He has a h/o HTN, DM, CHF, remote colon CA  Clinical Impression  Pt admitted with above diagnosis. Pt currently with functional limitations due to the deficits listed below (see PT Problem List).  Pt will benefit from skilled PT to increase their independence and safety with mobility to allow discharge to the venue listed below.  Evaluation was limited by pt actively vomiting and wanting to lie back down.  Depending on functional mobility recommend SNF vs. HHPT.  Will follow up to fully assess mobility.     Follow Up Recommendations Home health PT;SNF    Equipment Recommendations  Other (comment) (TBD)    Recommendations for Other Services       Precautions / Restrictions Precautions Precautions: Fall Restrictions Weight Bearing Restrictions: No      Mobility  Bed Mobility Overal bed mobility: Needs Assistance Bed Mobility: Supine to Sit     Supine to sit: HOB elevated;Min guard     General bed mobility comments: HOB elevated and with rails  Transfers Overall transfer level: Needs assistance Equipment used: Rolling walker (2 wheeled) Transfers: Sit to/from Stand Sit to Stand: Min guard         General transfer comment: At EOB, sonned shoes then pt began vomiting multiple times.  Scooted to [facility] with S, but deferred any further session.  Per OT notes, he got up with min/guard.  Ambulation/Gait                Stairs            Wheelchair Mobility    Modified Rankin (Stroke Patients Only)       Balance Overall balance assessment: No apparent balance deficits (not formally assessed)                                           Pertinent Vitals/Pain Pain Assessment: No/denies pain    Home Living  Family/patient expects to be discharged to:: Private residence Living Arrangements: Spouse/significant other   Type of Home: House Home Access: Other (comment);Stairs to enter   [employer] of Steps: stair lift Home Layout: Able to live on main level with bedroom/bathroom;Multi-level Home Equipment: Walker - 2 wheels;Walker - 4 wheels Additional Comments: lives with wife    Prior Function Level of Independence: Independent with assistive device(s)         Comments: Amb with rollator. Pt is very HOH and hard to clarify PLOF.     Hand Dominance        Extremity/Trunk Assessment   Upper Extremity Assessment: Defer to OT evaluation           Lower Extremity Assessment: Overall WFL for tasks assessed         Communication   Communication: HOH  Cognition Arousal/Alertness: Awake/alert Behavior During Therapy: WFL for tasks assessed/performed Overall Cognitive Status: Difficult to assess                      General Comments General comments (skin integrity, edema, etc.): sat EOB only due to active vomiting    Exercises        Assessment/Plan    PT Assessment Patient needs continued  PT services  PT Diagnosis Generalized weakness   PT Problem List Decreased strength;Decreased activity tolerance;Decreased mobility  PT Treatment Interventions Gait training;Functional mobility training;Therapeutic activities;Therapeutic exercise;Balance training   PT Goals (Current goals can be found in the Care Plan section) Acute Rehab PT Goals Patient Stated Goal: none stated PT Goal Formulation: With patient Time For Goal Achievement: 09/01/14 Potential to Achieve Goals: Good    Frequency Min 3X/week   Barriers to discharge        Co-evaluation               End of Session   Activity Tolerance: Treatment limited secondary to medical complications (Comment) Patient left: in bed;with call bell/phone within reach;with bed alarm set Nurse  Communication: Mobility status;Other (comment) (vomiting)         Time: 2500-3704 PT Time Calculation (min) (ACUTE ONLY): 20 min   Charges:   PT Evaluation $Initial PT Evaluation Tier I: 1 Procedure     PT G Codes:        Asja Frommer LUBECK 08/18/2014, 1:58 PM

## 2014-08-18 NOTE — Progress Notes (Signed)
Rt gave pt flutter valve. Pt knows and understands how to use. 

## 2014-08-18 NOTE — Care Management Note (Signed)
Case Management Note  Patient Details  Name: Nathan Blackwell MRN: [id number] Date of Birth: 01/15/1921  Subjective/Objective:    Cough, fever, Nausea, vomiting, diarrhea               Action/Plan:from home plan to DC to home with wife and [facility]   Expected Discharge Date:                  Expected Discharge Plan:  [place]  In-House Referral:     Discharge planning Services  CM Consult  Post Acute Care Choice:    Choice offered to:  Spouse  DME Arranged:    DME Agency:  [place]:  PT [place]:     Status of Service:     Medicare Important Message Given:    Date Medicare IM Given:    Medicare IM give by:    Date Additional Medicare IM Given:    Additional Medicare Important Message give by:     If discussed at [place] of Stay Meetings, dates discussed:    Additional Comments:Plan to dc home with HHRN/PT  Purcell Mouton, RN 08/18/2014, 3:50 PM

## 2014-08-18 NOTE — Evaluation (Signed)
Clinical/Bedside Swallow Evaluation Patient Details  Name: Nathan Blackwell MRN: [id number] Date of Birth: 24-Aug-1920  Today's Date: 08/18/2014 Time: SLP Start Time (ACUTE ONLY): 82 SLP Stop Time (ACUTE ONLY): 1135 SLP Time Calculation (min) (ACUTE ONLY): 30 min  Past Medical History:  Past Medical History  Diagnosis Date  . Diabetes mellitus type II   . Hyperlipidemia   . Hypertension   . Osteoporosis   . Prostatic hypertrophy     benign  . OSA (obstructive sleep apnea)   . Rotator cuff syndrome     chronic  . Elevated PSA   . Hypothyroidism   . Nephrolithiasis   . Thyroid nodule   . GERD (gastroesophageal reflux disease)   . Colitis, Clostridium difficile     presumed 11/08  . Dementia   . Anemia     NOS  . Vitamin B 12 deficiency   . BRADYCARDIA 06/10/2009  . COLON CANCER, HX OF 09/20/2006  . HYPOTHYROIDISM 01/14/2007  . DIABETES MELLITUS, TYPE II 09/20/2006  . HYPERLIPIDEMIA 09/20/2006  . ANEMIA-NOS 06/23/2008  . DEMENTIA 05/14/2007  . DEPRESSION 02/27/2007  . SLEEP APNEA, OBSTRUCTIVE 01/14/2007  . HYPERTENSION 09/20/2006  . GERD 01/14/2007  . OSTEOPOROSIS 10/08/2006  . PERIPHERAL EDEMA 09/03/2007  . RENAL INSUFFICIENCY 04/01/2010  . B12 deficiency 05/25/2010  . CLOSTRIDIUM DIFFICILE COLITIS 01/30/2007  . THYROID NODULE 01/14/2007  . CONSTIPATION, RECURRENT 01/06/2010  . BENIGN PROSTATIC HYPERTROPHY 10/08/2006  . ARTHRITIS 02/23/2007  . BACK PAIN, THORACIC REGION 06/04/2008  . COLONIC POLYPS, HX OF 10/08/2006  . NEPHROLITHIASIS, HX OF 01/14/2007  . Cancer of colon dx'd 1998  . Cancer of vocal cord dx'd 1998  . 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 DVT of lower limb, acute 01/12/2011  . Sleep apnea     CPAP dependent  . Diastolic congestive heart failure   . Hypothyroidism   . Diabetes mellitus    Past Surgical History:  Past Surgical History  Procedure Laterality Date  . Cholecystectomy  1999  . Inguinal herniorrhapy  1984  . Appendectomy  1998  . Colon resection   1998  . Skin cancer extraction      right ear-extensive scar  . Cataract extraction, bilateral    . Nm lexiscan myoview ltd  09/02/2013    Normal LV function and wall motion. EF 62%; rate dependent LBBB with Lexiscan, Low Risk fixed inferior defect suggestive of diaphragmatic attenuation and not infarct with normal wall motion.  . Transthoracic echocardiogram  08/03/2009    Normal LV size. Mild LVH. EF 60-65%. Gr 2 DD, mild MR.  . Esophagogastroduodenoscopy (egd) with propofol N/A 12/19/2013    Procedure: ESOPHAGOGASTRODUODENOSCOPY (EGD) WITH PROPOFOL;  Surgeon: Inda Castle, MD;  Location: [facility];  Service: Endoscopy;  Laterality: N/A;  . Savory dilation N/A 12/19/2013    Procedure: SAVORY DILATION;  Surgeon: Inda Castle, MD;  Location: [facility];  Service: Endoscopy;  Laterality: N/A;  With Fluoroscopy  . Esophagogastroduodenoscopy N/A 12/30/2013    Procedure: ESOPHAGOGASTRODUODENOSCOPY (EGD);  Surgeon: Inda Castle, MD;  Location: [facility];  Service: Endoscopy;  Laterality: N/A;  . Savory dilation N/A 12/30/2013    Procedure: SAVORY DILATION;  Surgeon: Inda Castle, MD;  Location: [facility];  Service: Endoscopy;  Laterality: N/A;   HPI:  81 y.o. male with PMH of hypertension, diabetes mellitus, diastolic congestive heart failure (EF 60-65% with grade 2 diastolic dysfunction), GERD, hypothyroidism, depression, OSA, CKD-III, history of C. difficile colitis, BPH, chronic back pain, remote history of  colon cancer 1998, remote history of vocal cord cancer 1998, history of DVT 2012 (completed anticoagulant treatment), who presents with cough, nausea, vomiting, diarrhea - ? aspiration.  Pt with h/o dysphagia per MBS 11/2013 - cervical web present and pt required dilatation. Swallow evaluation ordered.  CXR negative.  Question improved tolerance of po since cervical web dilatation in 2015? Marland Kitchen     Assessment / Plan / Recommendation Clinical Impression  Pt presents with  negative CN exam - except generalized weakness.  Pt family present and reports coughing today prior to eval/po.  Report of nausea/vomiting - brown colored per spouse but not coffee ground - since Saturday per pt.    Pt with intermittent cough during evaluation (mostly after swallows of boluses) - unable to identify if aspiration related due to baseline cough.   Family reports pt was coughing without po this am.     Pt denies coughing associated with intake at home - but admits to poor intake prior to admission.  He indicates food tastes bad and nausea has contributed to poor po.    CXR negative for acute infection.  Voice was strong - noted h/o vocal cord cancer 1998.    Recommend to start diet with strict aspiration/reflux precautions.  SLP to follow up briefly for tolerance, family education given pt h/o dysphagia requiring intervention.   Of note, pt is VERY HOH, wrote signs to educate caregivers/staff.      Aspiration Risk  Severe    Diet Recommendation Thin (? clears ?)   Medication Administration: Whole meds with liquid Compensations: Slow rate;Small sips/bites;Follow solids with liquid    Other  Recommendations Oral Care Recommendations: Oral care BID   Follow Up Recommendations    tbd   Frequency and Duration min 1 x/week  1 week   Pertinent Vitals/Pain Afebrile, decreased    SLP Swallow Goals     Swallow Study Prior Functional Status       General Date of Onset: 08/18/14 Other Pertinent Information: 81 y.o. male with PMH of hypertension, diabetes mellitus, diastolic congestive heart failure (EF 60-65% with grade 2 diastolic dysfunction), GERD, hypothyroidism, depression, OSA, CKD-III, history of C. difficile colitis, BPH, chronic back pain, remote history of colon cancer 1998, remote history of vocal cord cancer 1998, history of DVT 2012 (completed anticoagulant treatment), who presents with cough, nausea, vomiting, diarrhea - ? aspiration.  Pt with h/o dysphagia per  MBS 11/2013 - cervical web present and pt required dilatation. Swallow evaluation ordered.  CXR negative.   Type of Study: Bedside swallow evaluation Previous Swallow Assessment: MBS 2015 Diet Prior to this Study: NPO Temperature Spikes Noted: No Respiratory Status: Room air History of Recent Intubation: No Behavior/Cognition: Alert;Cooperative Oral Cavity - Dentition: Adequate natural dentition/normal for age Self-Feeding Abilities: Able to feed self Patient Positioning: Upright in bed Baseline Vocal Quality: Normal Volitional Cough: Strong Volitional Swallow: Able to elicit    Oral/Motor/Sensory Function Overall Oral Motor/Sensory Function: Other (comment) (generalized weakness, no focal deficits)   Ice Chips Ice chips: Not tested   Thin Liquid Thin Liquid: Within functional limits Presentation: Spoon;Straw    Nectar Thick Nectar Thick Liquid: Not tested   Honey Thick Honey Thick Liquid: Not tested   Puree Puree: Within functional limits Presentation: Spoon   Solid   GO    Solid: Impaired Presentation: Self Fed Oral Phase Impairments: Reduced lingual movement/coordination;Impaired anterior to posterior transit Oral Phase Functional Implications: Other (comment) (mildly prolonged mastication)       Luanna Salk  [place], [place] [facility] SLP [phone]

## 2014-08-18 NOTE — Progress Notes (Signed)
Order for swallow eval received.  Note pt with h/o dysphagia diagnosed per MBS 11/2013 and requiring dilatations 12/19/13 and 12/28/13.  Will completed eval this am as ordered. Luanna Salk, [facility] SLP [phone]

## 2014-08-18 NOTE — Evaluation (Addendum)
Occupational Therapy Evaluation Patient Details Name: Nathan Blackwell MRN: [id number] DOB: 02-14-1921 Today's Date: 08/18/2014    History of Present Illness This 81 year old man was admitted with cough, fever, nausea, vomiting, diarhea.  He has a h/o HTN, DM, CHF, remote colon CA; pt has chronic back pain   Clinical Impression   Pt was admitted for the above.  He will benefit from skilled OT to increase activity tolerance, safety, and independence with adls.  Pt currently needs min A overall.  Goals in acute are for supervision to min guard.  Pt is very HOH--unsure of PLOF    Follow Up Recommendations  Home health OT;Supervision/Assistance - 24 hour (vs SNF depending upon )    Equipment Recommendations   (to be further assessed)    Recommendations for Other Services       Precautions / Restrictions Precautions Precautions: Fall Restrictions Weight Bearing Restrictions: No      Mobility Bed Mobility Overal bed mobility: Needs Assistance Bed Mobility: Supine to Sit     Supine to sit: Min guard     General bed mobility comments: for safety  Transfers Overall transfer level: Needs assistance Equipment used: Rolling walker (2 wheeled) Transfers: Sit to/from Stand Sit to Stand: Min guard         General transfer comment: cues for UE placement    Balance                                            ADL Overall ADL's : Needs assistance/impaired     Grooming: Wash/dry hands;Set up;Sitting   Upper Body Bathing: Set up;Sitting   Lower Body Bathing: Minimal assistance;Sit to/from stand   Upper Body Dressing : Set up;Sitting   Lower Body Dressing: Minimal assistance;Sit to/from stand   Toilet Transfer: Minimal assistance;Ambulation;Comfort height toilet;RW             General ADL Comments: ambulated to bathroom to use toilet.  Pt fatiques easily:  wanted to return to bed.  Pt's shoes have velcro closures     Vision     Perception      Praxis      Pertinent Vitals/Pain Pain Assessment:  (h/o chronic back pain--no c/o during session)     Hand Dominance     Extremity/Trunk Assessment Upper Extremity Assessment Upper Extremity Assessment: Overall WFL for tasks assessed           Communication Communication Communication: HOH   Cognition Arousal/Alertness: Awake/alert Behavior During Therapy: WFL for tasks assessed/performed Overall Cognitive Status: Difficult to assess                     General Comments       Exercises       Shoulder Instructions      Home Living Family/patient expects to be discharged to:: Private residence Living Arrangements: Spouse/significant other   Type of Home: House Home Access: Other (comment);Stairs to enter [employer] of Steps: stair lift   Home Layout: Able to live on main level with bedroom/bathroom;Multi-level               Home Equipment: Walker - 2 wheels;Walker - 4 wheels   Additional Comments: lives with wife      Prior Functioning/Environment Level of Independence: Independent with assistive device(s)        Comments: Amb with rollator  OT Diagnosis: Generalized weakness   OT Problem List: Decreased strength;Decreased activity tolerance;Decreased knowledge of use of DME or AE;Impaired balance (sitting and/or standing)   OT Treatment/Interventions: Self-care/ADL training;DME and/or AE instruction;Patient/family education;Balance training;energy conservation    OT Goals(Current goals can be found in the care plan section) Acute Rehab OT Goals Patient Stated Goal: none stated OT Goal Formulation: With patient Time For Goal Achievement: 08/25/14 Potential to Achieve Goals: Good ADL Goals Pt Will Perform Grooming: with supervision;standing (2 tasks) Pt Will Transfer to Toilet: with supervision;ambulating;regular height toilet;bedside commode (vs) Pt Will Perform Toileting - Clothing Manipulation and hygiene: with  supervision;sit to/from stand Additional ADL Goal #1: pt will complete ADL with supervision/set up and 2 rest breaks, sit to stand  OT Frequency: Min 2X/week   Barriers to D/C:            Co-evaluation              End of Session    Activity Tolerance: Patient tolerated treatment well Patient left: in bed;with call bell/phone within reach   Time: 1250-1313 OT Time Calculation (min): 23 min Charges:  OT General Charges $OT Visit: 1 Procedure OT Evaluation $Initial OT Evaluation Tier I: 1 Procedure G-Codes:    Ionia Schey Sep 08, 2014, 1:48 PM  Lesle Chris, OTR/L [phone] 09-08-2014

## 2014-08-18 NOTE — Progress Notes (Signed)
Pt assessed for qhs CPAP. Refuses CPAP at this time. Pt. Is aware to inform RT or RN anytime during the night if he changes his mind.

## 2014-08-18 NOTE — Progress Notes (Signed)
OT Cancellation Note  Patient Details Name: EARNEST MCGILLIS MRN: [id number] DOB: 1921-03-01   Cancelled Treatment:    Reason Eval/Treat Not Completed: Other (comment).  Noted pt is on bedrest until 12:45.  Will check back.  Beyla Loney 08/18/2014, 10:55 AM  Lesle Chris, OTR/L [phone] 08/18/2014

## 2014-08-18 NOTE — Progress Notes (Signed)
Patient Demographics  Nathan Blackwell, is a 81 y.o. male, DOB - 01-Jun-1920, OIN:[id number]  Admit date - 08/17/2014   Admitting Physician Ivor Costa, MD  Outpatient Primary MD for the patient is [name], MD  LOS - 0   Chief Complaint  Patient presents with  . Diarrhea  . Emesis  . Fever  . Cough       Admission HPI/Brief narrative: 81 year old male presents with complaints of fever and 9.7, cough, followed by nausea vomiting and diarrhea after he was started on steroid-induced and levofloxacin. Subjective:   Nathan Blackwell today has, No headache, No chest pain, No abdominal pain - No Nausea, no vomiting or diarrhea since admission, complaints of cough, nonproductive, reports poor appetite and generalized weakness.   Assessment & Plan    Principal Problem:   Cough Active Problems:   Hypothyroidism   Diabetes   Depression   OSA (obstructive sleep apnea)   Essential hypertension   GERD   Stage 3 chronic renal impairment associated with type 2 diabetes mellitus   H/O Deep venous thrombosis - left femoral vein   CKD (chronic kidney disease), stage III   Dysphagia, pharyngoesophageal phase   Weakness   Nausea vomiting and diarrhea   Hypokalemia   Diastolic congestive heart failure   Cough - most likely related to Acute bronchitis versus URI - Patient presents with fever, cough, chest x-ray with no evidence of pneumonia, unclear this is related to viral illness versus acute bronchitis. - Continue with doxycycline, pulmonary toilet. - Follow on respiratory virus panel  Nausea vomiting and diarrhea - Most likely related to viral gastroenteritis, no diarrhea since admission, check C. difficile PCR, and GI path panel - Continue with when necessary nausea medication, continue with IV fluid  Diastolic CHF - 2-D echo in 2011 showing EF 6065% with grade 2 diastolic dysfunction. - Appears  compensated, and tinea to monitor closely of Lasix.  Hypothyroidism - Continue with Synthroid  Diabetes mellitus - Continue with insulin sliding scale  OSA - on CPAP  GERD: -PPI switch to Pepcid till C. difficile is ruled out  Stage 3 chronic renal impairment associated with type 2 diabetes mellitus: -  Renal function stable. Baseline creatinine 1.7, his creatinine is 1.72 on admission. -Follow-up renal function by BMP  Dysphagia, pharyngoesophageal phase: -NPO now -SLP  Hypokalemia: - Repleted   Code Status: Full  Family Communication: spoke with daughter over the phone.  Disposition Plan: home when stable    Procedures None   Consults   None   Medications  Scheduled Meds: . antiseptic oral rinse  7 mL Mouth Rinse BID  . cholecalciferol  1,000 Units Oral Daily  . citalopram  20 mg Oral Daily  . dextromethorphan-guaiFENesin  1 tablet Oral BID  . doxycycline  100 mg Oral Q12H  . famotidine  20 mg Oral BID  . heparin  5,000 Units Subcutaneous 3 times per day  . hydrALAZINE  25 mg Oral BID  . insulin aspart  0-9 Units Subcutaneous TID WC  . isosorbide mononitrate  30 mg Oral Daily  . levothyroxine  100 mcg Oral QAC breakfast  . lipase/protease/amylase  12,000 Units Oral TID WC  . OLANZapine  5 mg Oral Daily  . QUEtiapine  50 mg Oral QHS  . tamsulosin  0.4 mg Oral QHS   Continuous Infusions: . sodium chloride 50 mL/hr at 08/18/14 0610   PRN Meds:.hydrALAZINE, hydrOXYzine, ipratropium-albuterol  DVT Prophylaxis  - Heparin -   Lab Results  Component Value Date   PLT 198 08/17/2014    Antibiotics    Anti-infectives    Start     Dose/Rate Route Frequency Ordered Stop   08/18/14 0045  doxycycline (VIBRA-TABS) tablet 100 mg     100 mg Oral Every 12 hours 08/18/14 0044            Objective:   Filed Vitals:   08/18/14 0046 08/18/14 0053 08/18/14 0554 08/18/14 0709  BP:  191/68 175/59 154/60  Pulse:  49 59   Temp:  97.9 F (36.6 C) 98 F  (36.7 C)   TempSrc:  Oral Oral   Resp:  16 18   Height: 6' (1.829 m)     Weight: 71.7 kg (158 lb 1.1 oz)     SpO2:  97% 95%     Wt Readings from Last 3 Encounters:  08/18/14 71.7 kg (158 lb 1.1 oz)  08/17/14 73.936 kg (163 lb)  06/24/14 76.712 kg (169 lb 1.9 oz)     Intake/Output Summary (Last 24 hours) at 08/18/14 1111 Last data filed at 08/18/14 1058  Gross per 24 hour  Intake  242.5 ml  Output   1075 ml  Net -832.5 ml     Physical Exam  Awake Alert, Oriented X 3, No new F.N deficits, Normal affect [place].AT,PERRAL Supple Neck,No JVD, No cervical lymphadenopathy appriciated.  Symmetrical Chest wall movement, Good air movement bilaterally, scattered rales RRR,No Gallops,Rubs or new Murmurs, No Parasternal Heave +ve B.Sounds, Abd Soft, No tenderness, No organomegaly appriciated, No rebound - guarding or rigidity. No Cyanosis, Clubbing , mild pitting edema, No new Rash or bruise     Data Review   Micro Results No results found for this or any previous visit (from the past 240 hour(s)).  Radiology Reports Dg Chest 2 View  08/17/2014   CLINICAL DATA:  81 year old diabetic hypertensive male with cough and congestion with shortness breath and chest pain for the past week. Initial encounter.  EXAM: CHEST  2 VIEW  COMPARISON:  05/24/2013 and 07/10/2012.  FINDINGS: Biapical pleural thickening without associated bony destruction similar to prior exam.  No infiltrate, congestive heart failure or pneumothorax.  Calcified mildly tortuous aorta.  Heart size top-normal.  IMPRESSION: No active cardiopulmonary disease.  Please see above.   Electronically Signed   By: Genia Del M.D.   On: 08/17/2014 11:14   Dg Abd Acute W/chest  08/17/2014   CLINICAL DATA:  Nausea and upper abdominal pain for 24 hours.  EXAM: DG ABDOMEN ACUTE W/ 1V CHEST  COMPARISON:  Chest 08/17/2014  FINDINGS: Normal heart size and pulmonary vascularity. No focal airspace disease or consolidation in the lungs. No  blunting of costophrenic angles. No pneumothorax. Mediastinal contours appear intact.  Scattered gas and stool in the colon. No small or large bowel distention. No free intra-abdominal air. No abnormal air-fluid levels. No radiopaque stones. Visualized bones appear intact. Surgical clips in the right upper quadrant.  IMPRESSION: No evidence of active pulmonary disease. Normal nonobstructive bowel gas pattern.   Electronically Signed   By: Lucienne Capers M.D.   On: 08/17/2014 22:02     CBC  Recent Labs Lab 08/17/14 1108 08/17/14 2126  WBC 8.2 7.8  HGB 12.7* 11.9*  HCT 37.9*  36.6*  PLT 226.0 198  MCV 94.1 95.8  MCH  --  31.2  MCHC 33.6 32.5  RDW 14.1 13.3  LYMPHSABS 1.1 0.5*  MONOABS 0.5 0.2  EOSABS 0.1 0.0  BASOSABS 0.0 0.0    Chemistries   Recent Labs Lab 08/17/14 2126 08/18/14 0500  NA 137  --   K 3.1*  --   CL 97*  --   CO2 24  --   GLUCOSE 289*  --   BUN 32*  --   CREATININE 1.72*  --   CALCIUM 8.7*  --   MG  --  1.7  AST 27  --   ALT 12*  --   ALKPHOS 87  --   BILITOT 0.7  --    ------------------------------------------------------------------------------------------------------------------ estimated creatinine clearance is 28.4 mL/min (by C-G formula based on Cr of 1.72). ------------------------------------------------------------------------------------------------------------------  Recent Labs  08/17/14 1108  HGBA1C 7.2*   ------------------------------------------------------------------------------------------------------------------ No results for input(s): CHOL, HDL, LDLCALC, TRIG, CHOLHDL, LDLDIRECT in the last 72 hours. ------------------------------------------------------------------------------------------------------------------ No results for input(s): TSH, T4TOTAL, T3FREE, THYROIDAB in the last 72 hours.  Invalid input(s):  FREET3 ------------------------------------------------------------------------------------------------------------------ No results for input(s): VITAMINB12, FOLATE, FERRITIN, TIBC, IRON, RETICCTPCT in the last 72 hours.  Coagulation profile  Recent Labs Lab 08/18/14 0149  INR 1.12    No results for input(s): DDIMER in the last 72 hours.  Cardiac Enzymes No results for input(s): CKMB, TROPONINI, MYOGLOBIN in the last 168 hours.  Invalid input(s): CK ------------------------------------------------------------------------------------------------------------------ Invalid input(s): POCBNP     Time Spent in minutes   30 minutes   ELGERGAWY, DAWOOD M.D on 08/18/2014 at 11:11 AM  Between 7am to 7pm - Pager - [phone]  After 7pm go to [email or web address] - password [email or web address]  [facility]   Office  [phone]

## 2014-08-19 DIAGNOSIS — J209 Acute bronchitis, unspecified: Secondary | ICD-10-CM

## 2014-08-19 DIAGNOSIS — J9601 Acute respiratory failure with hypoxia: Secondary | ICD-10-CM

## 2014-08-19 DIAGNOSIS — R05 Cough: Secondary | ICD-10-CM

## 2014-08-19 DIAGNOSIS — N183 Chronic kidney disease, stage 3 (moderate): Secondary | ICD-10-CM

## 2014-08-19 LAB — BASIC METABOLIC PANEL
ANION GAP: 12 (ref 5–15)
BUN: 34 mg/dL — ABNORMAL HIGH (ref 6–20)
CHLORIDE: 102 mmol/L (ref 101–111)
CO2: 29 mmol/L (ref 22–32)
Calcium: 8.7 mg/dL — ABNORMAL LOW (ref 8.9–10.3)
Creatinine, Ser: 1.93 mg/dL — ABNORMAL HIGH (ref 0.61–1.24)
GFR calc Af Amer: 33 mL/min — ABNORMAL LOW (ref >60)
GFR calc non Af Amer: 29 mL/min — ABNORMAL LOW (ref >60)
Glucose, Bld: 131 mg/dL — ABNORMAL HIGH (ref 65–99)
Potassium: 3.1 mmol/L — ABNORMAL LOW (ref 3.5–5.1)
SODIUM: 143 mmol/L (ref 135–145)

## 2014-08-19 LAB — CBC
HCT: 33.6 % — ABNORMAL LOW (ref 39.0–52.0)
HEMOGLOBIN: 10.9 g/dL — AB (ref 13.0–17.0)
MCH: 30.8 pg (ref 26.0–34.0)
MCHC: 32.4 g/dL (ref 30.0–36.0)
MCV: 94.9 fL (ref 78.0–100.0)
PLATELETS: 176 10*3/uL (ref 150–400)
RBC: 3.54 MIL/uL — ABNORMAL LOW (ref 4.22–5.81)
RDW: 13.5 % (ref 11.5–15.5)
WBC: 7.8 10*3/uL (ref 4.0–10.5)

## 2014-08-19 LAB — GLUCOSE, CAPILLARY
GLUCOSE-CAPILLARY: 115 mg/dL — AB (ref 65–99)
Glucose-Capillary: 111 mg/dL — ABNORMAL HIGH (ref 65–99)
Glucose-Capillary: 164 mg/dL — ABNORMAL HIGH (ref 65–99)
Glucose-Capillary: 158 mg/dL — ABNORMAL HIGH (ref 65–99)

## 2014-08-19 LAB — URINE CULTURE
COLONY COUNT: NO GROWTH
CULTURE: NO GROWTH

## 2014-08-19 LAB — LEGIONELLA ANTIGEN, URINE

## 2014-08-19 NOTE — Progress Notes (Signed)
Patient ID: Nathan Blackwell, male   DOB: [birth year], 81 y.o.   MRN: [id number] [facility] PROGRESS NOTE  JUANCARLOS CRESCENZO RFF:[id number] DOB: May 22, 1920 DOA: 08/17/2014 PCP: [name], MD  Brief narrative:    81 year old male with past medical history of hypertension, depression, diabetes, hypothyroidism, chronic diastlic CHF (last 2 D ECHO in 2011 with grade 2 DD preserved EF). He presented to PCP office 08/17/14 with concern for fever and cough for past 1 week prior to this admission. Cough did not subside with OTC meds. His PCP gave him steroids and Levaquin but soon after taking this he developed nausea, vomiting and diarrhea. Abd and chest x ray on admission did not show acute processes. He was started on empiric doxycycline for possible bronchitis.   Anticipated discharge: to SNF by 08/21/2014.   Assessment/Plan:    Principal Problem: Acute bronchitis / Acute respiratory failure with hypoxia - No clear source of hypoxia although bronchitis may be a possibility - He feels better based on his wife's report but he is little lethargic this am - Stable respiratory status - Continue doxycyline for now  - Strep pneumonia, legionella, blood cultures all negative - May continue duoneb every 6 hours PRN for shortness of breath or wheezing   Active Problems: Nausea vomiting and diarrhea - Has spontaneously resolved - No diarrhea since admission  Chronic diastolic CHF - 2-D echo in 2011 showed EF 6065% with grade 2 diastolic dysfunction. - Compensated   Hypothyroidism - Continue Synthroid  Diabetes mellitus with renal manifestations  - Continue with insulin sliding scale - CBG's in past 12 hours: 111, 115, 164  OSA - on CPAP  CKD stage 3 - Baseline creatinine 1.7 and on this admission 1.7 - 1.9 range  Anemia of chronic disease - Secondary to CKD - Hemoglobin is 10.9  Dysphagia, pharyngoesophageal phase - Per SLP dysphagia 3 diet   Hypokalemia - Due to GI losses -  Supplemented   DVT Prophylaxis  - Heparin subQ ordered   Code Status: Full.  Family Communication:  plan of care discussed with the patient's wife at the bedside  Disposition Plan: to [facility] Friday 08/21/2014.   IV access:  Peripheral IV  Procedures and diagnostic studies:    Dg Chest 2 View 08/17/2014  No active cardiopulmonary disease.     Dg Abd Acute W/chest 08/17/2014 No evidence of active pulmonary disease. Normal nonobstructive bowel gas pattern.     Medical Consultants:  None   Other Consultants:  Physical therapy Social work  IAnti-Infectives:   Doxycycline    [name], MD  [facility] Pager [phone]  Time spent in minutes: 25 minutes  If 7PM-7AM, please contact night-coverage [email or web address] Password [facility] 08/19/2014, 5:48 PM   LOS: 1 day    HPI/Subjective: No acute overnight events. Patient lethargic, unable to voice any concerns.   Objective: Filed Vitals:   08/18/14 1853 08/18/14 2115 08/19/14 0435 08/19/14 1448  BP: 153/68 187/66 144/55 127/52  Pulse: 57 60 63 60  Temp:  98.2 F (36.8 C) 97.6 F (36.4 C) 97.9 F (36.6 C)  TempSrc:  Oral Oral Oral  Resp:  20 18 20   Height:      Weight:   70.4 kg (155 lb 3.3 oz)   SpO2:  95% 91% 93%    Intake/Output Summary (Last 24 hours) at 08/19/14 1748 Last data filed at 08/19/14 0900  Gross per 24 hour  Intake    240 ml  Output  1 ml  Net    239 ml    Exam:   General:  Pt is lethargic, not in acute distress  Cardiovascular: Rate controlled, (+) S1, S2  Respiratory: Clear to auscultation bilaterally, no wheezing, no crackles, no rhonchi  Abdomen: Soft, non tender, non distended, bowel sounds present  Extremities: No cyanosis, pulses DP and PT palpable bilaterally  Neuro: Grossly nonfocal  Data Reviewed: Basic Metabolic Panel:  Recent Labs Lab 08/17/14 2126 08/18/14 0500 08/19/14 0437  NA 137  --  143  K 3.1*  --  3.1*  CL 97*  --  102  CO2 24  --  29  GLUCOSE 289*  --   131*  BUN 32*  --  34*  CREATININE 1.72*  --  1.93*  CALCIUM 8.7*  --  8.7*  MG  --  1.7  --    Liver Function Tests:  Recent Labs Lab 08/17/14 2126  AST 27  ALT 12*  ALKPHOS 87  BILITOT 0.7  PROT 7.9  ALBUMIN 3.9    Recent Labs Lab 08/17/14 2145  LIPASE 23   No results for input(s): AMMONIA in the last 168 hours. CBC:  Recent Labs Lab 08/17/14 1108 08/17/14 2126 08/19/14 0437  WBC 8.2 7.8 7.8  NEUTROABS 6.5 7.1  --   HGB 12.7* 11.9* 10.9*  HCT 37.9* 36.6* 33.6*  MCV 94.1 95.8 94.9  PLT 226.0 198 176   Cardiac Enzymes: No results for input(s): CKTOTAL, CKMB, CKMBINDEX, TROPONINI in the last 168 hours. BNP: Invalid input(s): POCBNP CBG:  Recent Labs Lab 08/18/14 1630 08/18/14 2123 08/19/14 0735 08/19/14 1158 08/19/14 1652  GLUCAP 243* 94 111* 115* 164*    Urine culture     Status: None   Collection Time: 08/17/14 10:08 PM  Result Value Ref Range Status   Specimen Description URINE, CLEAN CATCH  Final   Special Requests NONE  Final   Culture NO GROWTH Performed at [facility]   Final   Report Status 08/19/2014 FINAL  Final  Culture, blood (x 2)     Status: None (Preliminary result)   Collection Time: 08/18/14  1:45 AM  Result Value Ref Range Status   Specimen Description BLOOD LEFT ARM  Final   Special Requests BOTTLES DRAWN AEROBIC AND ANAEROBIC 8CC  Final   Culture   Final           BLOOD CULTURE RECEIVED NO GROWTH TO DATE CULTURE WILL BE HELD FOR 5 DAYS BEFORE ISSUING A FINAL NEGATIVE REPORT Performed at [facility]    Report Status PENDING  Incomplete  Culture, blood (x 2)     Status: None (Preliminary result)   Collection Time: 08/18/14  1:49 AM  Result Value Ref Range Status   Specimen Description BLOOD LEFT HAND  Final   Special Requests BOTTLES DRAWN AEROBIC ONLY 10CC  Final   Culture   Final           BLOOD CULTURE RECEIVED NO GROWTH TO DATE CULTURE WILL BE HELD FOR 5 DAYS BEFORE ISSUING A FINAL NEGATIVE  REPORT Performed at [facility]    Report Status PENDING  Incomplete     Scheduled Meds: . cholecalciferol  1,000 Units Oral Daily  . citalopram  20 mg Oral Daily  . doxycycline  100 mg Oral Q12H  . famotidine  20 mg Oral BID  . heparin  5,000 Units Subcutaneous 3 times per day  . hydrALAZINE  25 mg Oral BID  .  insulin aspart  0-9 Units Subcutaneous TID WC  . isosorbide mononitrate  30 mg Oral Daily  . levothyroxine  100 mcg Oral QAC breakfast  . lipase/protease/amylase  12,000 Units Oral TID WC  . OLANZapine  5 mg Oral Daily  . QUEtiapine  50 mg Oral QHS  . tamsulosin  0.4 mg Oral QHS   Continuous Infusions: . sodium chloride Stopped (08/18/14 1243)

## 2014-08-19 NOTE — Clinical Social Work Note (Signed)
Clinical Social Work Assessment  Patient Details  Name: Nathan Blackwell MRN: [id number] Date of Birth: 1920-04-03  Date of referral:  08/19/14               Reason for consult:  Facility Placement                Permission sought to share information with:  Chartered certified accountant granted to share information::  Yes, Verbal Permission Granted  Name::        Agency::     Relationship::     Contact Information:     Housing/Transportation Living arrangements for the past 2 months:  Single Family Home Source of Information:  Spouse Patient Interpreter Needed:  None Criminal Activity/Legal Involvement Pertinent to Current Situation/Hospitalization:  No - Comment as needed Significant Relationships:  Spouse, Adult Children Lives with:  Spouse Do you feel safe going back to the place where you live?  No Need for family participation in patient care:  Yes (Comment)  Care giving concerns:  CSW reviewed PT evaluation recommending HH vs. SNF at discharge.    Social Worker assessment / plan:  CSW spoke with patient's wife, [name] at nurses station, wife informed CSW that Dr. Charlies Silvers is recommending SNF at this time.   Employment status:  Retired Forensic scientist:  Information systems manager PT Recommendations:  [place], Home with [place] / Referral to community resources:  [place]  Patient/Family's Response to care:  Patient's wife is agreeable with plan for SNF, CSW provided wife with SNF bed offers and map. Wife expressed interest in [facility] as they have friends that are there. CSW awaiting response from [place] at [place] re: bed availability.   Patient/Family's Understanding of and Emotional Response to Diagnosis, Current Treatment, and Prognosis:  Patient's wife expressed that she does not feel that she would be able to take care of him currently in his weakened condition and would prefer that he go to a SNF if not but  for a couple weeks.   Emotional Assessment Appearance:    Attitude/Demeanor/Rapport:    Affect (typically observed):    Orientation:  Oriented to Self, Oriented to Place, Oriented to  Time, Oriented to Situation Alcohol / Substance use:    Psych involvement (Current and /or in the community):     Discharge Needs  Concerns to be addressed:    Readmission within the last 30 days:    Current discharge risk:    Barriers to Discharge:      Standley Brooking, LCSW 08/19/2014, 10:22 AM

## 2014-08-19 NOTE — Clinical Social Work Placement (Signed)
   CLINICAL SOCIAL WORK PLACEMENT  NOTE  Date:  08/19/2014  Patient Details  Name: Nathan Blackwell MRN: [id number] Date of Birth: September 18, 1920  Clinical Social Work is seeking post-discharge placement for this patient at the [place] level of care (*CSW will initial, date and re-position this form in  chart as items are completed):  Yes   Patient/family provided with [facility] Work Department's list of facilities offering this level of care within the geographic area requested by the patient (or if unable, by the patient's family).  Yes   Patient/family informed of their freedom to choose among providers that offer the needed level of care, that participate in Medicare, Medicaid or managed care program needed by the patient, have an available bed and are willing to accept the patient.  Yes   Patient/family informed of [facility]'s ownership interest in [facility] and [facility], as well as of the fact that they are under no obligation to receive care at these facilities.  PASRR submitted to EDS on 08/19/14     PASRR number received on 08/19/14     Existing PASRR number confirmed on       FL2 transmitted to all facilities in geographic area requested by pt/family on 08/19/14     FL2 transmitted to all facilities within larger geographic area on       Patient informed that his/her managed care company has contracts with or will negotiate with certain facilities, including the following:        Yes   Patient/family informed of bed offers received.  Patient chooses bed at [facility]     Physician recommends and patient chooses bed at      Patient to be transferred to   on  .  Patient to be transferred to facility by       Patient family notified on   of transfer.  Name of family member notified:        PHYSICIAN       Additional Comment:    _______________________________________________ Standley Brooking, LCSW 08/19/2014, 10:25  AM

## 2014-08-19 NOTE — Progress Notes (Signed)
Physical Therapy Treatment Patient Details Name: Nathan Blackwell MRN: [id number] DOB: 1920/04/24 Today's Date: 14-Sep-2014    History of Present Illness This 81 year old man was admitted with cough, fever, nausea, vomiting, diarhea.  He has a h/o HTN, DM, CHF, remote colon CA    PT Comments    Pt presents with limited endurance and reports SOB during gait.  Pt assisted to bathroom then ambulated short distance in hallway.  Follow Up Recommendations  SNF     Equipment Recommendations  None recommended by PT    Recommendations for Other Services       Precautions / Restrictions Precautions Precautions: Fall Restrictions Weight Bearing Restrictions: No    Mobility  Bed Mobility Overal bed mobility: Modified Independent             General bed mobility comments: bed alarm sounding just prior to entering room with pt attempting to get OOB to use bathroom  Transfers Overall transfer level: Needs assistance Equipment used: Rolling walker (2 wheeled) Transfers: Sit to/from Stand Sit to Stand: Min guard         General transfer comment: visual and verbal cues for using UEs to assist with transfers  Ambulation/Gait Ambulation/Gait assistance: Min guard Ambulation Distance (Feet): 120 Feet Assistive device: Rolling walker (2 wheeled) Gait Pattern/deviations: Step-through pattern;Shuffle;Narrow base of support     General Gait Details: reports fatigue near end of ambulation, reports SOB as well however SpO2 97% room air upon sitting    Stairs            Wheelchair Mobility    Modified Rankin (Stroke Patients Only)       Balance                                    Cognition Arousal/Alertness: Awake/alert Behavior During Therapy: WFL for tasks assessed/performed Overall Cognitive Status: Difficult to assess                      Exercises      General Comments        Pertinent Vitals/Pain Pain Assessment: No/denies pain     Home Living                      Prior Function            PT Goals (current goals can now be found in the care plan section) Progress towards PT goals: Progressing toward goals    Frequency  Min 3X/week    PT Plan Current plan remains appropriate    Co-evaluation             End of Session   Activity Tolerance: Patient limited by fatigue Patient left: in bed;with call bell/phone within reach;with bed alarm set     Time: 7681-1572 PT Time Calculation (min) (ACUTE ONLY): 19 min  Charges:  $Gait Training: 8-22 mins                    G Codes:      Nathan Blackwell,Nathan Blackwell 09-14-14, 1:15 PM Carmelia Bake, PT, DPT 09/14/14 Pager: [phone]

## 2014-08-19 NOTE — Progress Notes (Signed)
Speech Language Pathology Treatment: Dysphagia  Patient Details Name: Nathan Blackwell MRN: [id number] DOB: Jul 07, 1920 Today's Date: 08/19/2014 Time: 8757-9728 SLP Time Calculation (min) (ACUTE ONLY): 25 min  Assessment / Plan / Recommendation Clinical Impression  Pt reports swallow ability is currently at baseline.  Observed pt self feeding  4 oz applesauce, 4 crackers and 4 oz juice at rapid rate with good tolerance.  No indication of aspiration during intake.  Minimal cough x1 noted after complete snack.    Advised pt/spouse to aspiration precautions to mitigate risk.   Reviewed findings of prior MBS and strategies to improve swallow efficiency.   Chin tuck assisted to decrease residuals but pt has not been conducting strategy home.    Pt appears to be tolerating po well and he denies vomiting since yesterday.  Recommend advance diet to dys3/thin with precautions.  Will sign off.     HPI Other Pertinent Information: 81 y.o. male with PMH of hypertension, diabetes mellitus, diastolic congestive heart failure (EF 60-65% with grade 2 diastolic dysfunction), GERD, hypothyroidism, depression, OSA, CKD-III, history of C. difficile colitis, BPH, chronic back pain, remote history of colon cancer 1998, remote history of vocal cord cancer 1998, history of DVT 2012 (completed anticoagulant treatment), who presents with cough, nausea, vomiting, diarrhea - ? aspiration.  Pt with h/o dysphagia per MBS 11/2013 - cervical web present and pt required dilatation. CXR negative.     Pertinent Vitals Pain Assessment: No/denies pain  SLP Plan  All goals met    Recommendations Diet recommendations: Dysphagia 3 (mechanical soft);Thin liquid Liquids provided via: Cup;Straw Medication Administration: Whole meds with liquid Supervision: Patient able to self feed Compensations: Slow rate;Small sips/bites (consume liquids during meal, tuck chin if prevents coughing) Postural Changes and/or Swallow Maneuvers: Seated  upright 90 degrees;Upright 30-60 min after meal              Oral Care Recommendations: Oral care BID Follow up Recommendations: None Plan: All goals met    [place], [place] [facility] [place]

## 2014-08-19 NOTE — Progress Notes (Signed)
Pt refuses nocturnal CPAP at this time.  Pt aware to inform RT or RN anytime during the night if he changes his mind.

## 2014-08-20 DIAGNOSIS — F329 Major depressive disorder, single episode, unspecified: Secondary | ICD-10-CM

## 2014-08-20 DIAGNOSIS — I10 Essential (primary) hypertension: Secondary | ICD-10-CM

## 2014-08-20 DIAGNOSIS — E876 Hypokalemia: Secondary | ICD-10-CM

## 2014-08-20 LAB — BASIC METABOLIC PANEL
Anion gap: 10 (ref 5–15)
BUN: 36 mg/dL — ABNORMAL HIGH (ref 6–20)
CHLORIDE: 103 mmol/L (ref 101–111)
CO2: 26 mmol/L (ref 22–32)
Calcium: 8.5 mg/dL — ABNORMAL LOW (ref 8.9–10.3)
Creatinine, Ser: 1.72 mg/dL — ABNORMAL HIGH (ref 0.61–1.24)
GFR calc Af Amer: 38 mL/min — ABNORMAL LOW (ref >60)
GFR calc non Af Amer: 33 mL/min — ABNORMAL LOW (ref >60)
Glucose, Bld: 181 mg/dL — ABNORMAL HIGH (ref 65–99)
Potassium: 2.7 mmol/L — CL (ref 3.5–5.1)
Sodium: 139 mmol/L (ref 135–145)

## 2014-08-20 LAB — GLUCOSE, CAPILLARY
GLUCOSE-CAPILLARY: 172 mg/dL — AB (ref 65–99)
GLUCOSE-CAPILLARY: 124 mg/dL — AB (ref 65–99)
GLUCOSE-CAPILLARY: 179 mg/dL — AB (ref 65–99)
Glucose-Capillary: 106 mg/dL — ABNORMAL HIGH (ref 65–99)

## 2014-08-20 LAB — RESPIRATORY VIRUS PANEL
Adenovirus: NEGATIVE
INFLUENZA A: NEGATIVE
INFLUENZA B 1: NEGATIVE
METAPNEUMOVIRUS: POSITIVE — AB
PARAINFLUENZA 1 A: NEGATIVE
PARAINFLUENZA 3 A: NEGATIVE
Parainfluenza 2: NEGATIVE
RESPIRATORY SYNCYTIAL VIRUS A: NEGATIVE
Respiratory Syncytial Virus B: NEGATIVE
Rhinovirus: NEGATIVE

## 2014-08-20 MED ORDER — IPRATROPIUM-ALBUTEROL 0.5-2.5 (3) MG/3ML IN SOLN: 3.0000 mL | Freq: Four times a day (QID) | RESPIRATORY_TRACT | Status: DC | PRN

## 2014-08-20 MED ORDER — QUETIAPINE FUMARATE 50 MG PO TABS: ORAL_TABLET | ORAL | Status: DC

## 2014-08-20 MED ORDER — FAMOTIDINE 20 MG PO TABS: 20.0000 mg | ORAL_TABLET | Freq: Every day | ORAL | Status: DC

## 2014-08-20 MED ORDER — FUROSEMIDE 40 MG PO TABS: 40.0000 mg | ORAL_TABLET | Freq: Every day | ORAL | Status: DC

## 2014-08-20 MED ORDER — CITALOPRAM HYDROBROMIDE 20 MG PO TABS: 20.0000 mg | ORAL_TABLET | ORAL | Status: DC

## 2014-08-20 MED ORDER — DM-GUAIFENESIN ER 30-600 MG PO TB12: 1.0000 | ORAL_TABLET | Freq: Two times a day (BID) | ORAL | Status: DC | PRN

## 2014-08-20 MED ORDER — OLANZAPINE 5 MG PO TABS: 5.0000 mg | ORAL_TABLET | ORAL | Status: DC

## 2014-08-20 MED ORDER — POTASSIUM CHLORIDE CRYS ER 20 MEQ PO TBCR
40.0000 meq | EXTENDED_RELEASE_TABLET | Freq: Once | ORAL | Status: AC
  Administered 2014-08-20: 40 meq via ORAL
  Filled 2014-08-20: qty 2

## 2014-08-20 MED ORDER — HYDRALAZINE HCL 25 MG PO TABS
25.0000 mg | ORAL_TABLET | Freq: Four times a day (QID) | ORAL | Status: DC
  Administered 2014-08-20 – 2014-08-21 (×4): 25 mg via ORAL
  Filled 2014-08-20 (×4): qty 1

## 2014-08-20 MED ORDER — HYDRALAZINE HCL 25 MG PO TABS: 25.0000 mg | ORAL_TABLET | Freq: Three times a day (TID) | ORAL | Status: AC

## 2014-08-20 MED ORDER — POTASSIUM CHLORIDE ER 10 MEQ PO TBCR: 10.0000 meq | EXTENDED_RELEASE_TABLET | Freq: Every day | ORAL | Status: DC

## 2014-08-20 NOTE — Progress Notes (Signed)
Pt refuses CPAP QHS, RT to monitor and assess as needed.  

## 2014-08-20 NOTE — Progress Notes (Signed)
CRITICAL VALUE ALERT  Critical value received: K=2.7  Date of notification: 08/20/14  Time of notification: 1200  Critical value read back:yes  Nurse who received alert: Heloise Purpura  MD notified (1st page): Dr. Charlies Silvers  Time of first page: 1205  MD notified (2nd page):  Time of second page:  Responding MD: Dr. Charlies Silvers  Time MD responded: [phone]

## 2014-08-20 NOTE — Progress Notes (Addendum)
Patient ID: Nathan Blackwell, male   DOB: [birth year], 81 y.o.   MRN: [id number] [facility] PROGRESS NOTE  YISRAEL OBRYAN YFV:[id number] DOB: January 13, 1921 DOA: 08/17/2014 PCP: [name], MD  Brief narrative:    81 year old male with past medical history of hypertension, depression, diabetes, hypothyroidism, chronic diastlic CHF (last 2 D ECHO in 2011 with grade 2 DD preserved EF). He presented to PCP office 08/17/14 with concern for fever and cough for past 1 week prior to this admission. Cough did not subside with OTC meds. His PCP gave him steroids and Levaquin but soon after taking this he developed nausea, vomiting and diarrhea. Abd and chest x ray on admission did not show acute processes. He was started on empiric doxycycline for possible bronchitis.   Anticipated discharge: to SNF by 08/21/2014.   Assessment/Plan:    Principal Problem: Acute bronchitis / Acute respiratory failure with hypoxia - No clear source of hypoxia although bronchitis is definitely a possibility. - Feels better this morning.  - Respiratory status continues to be stable. - We'll continue empiric doxycycline. - Blood cultures, Legionella and strep pneumonia are all negative.   Active Problems: Nausea vomiting and diarrhea - Possible acute viral gastroenteritis. No fevers and a normal white blood cell count. - Nausea, vomiting, diarrhea resolved.  Chronic diastolic CHF - 2-D echo in 2011 showed EF 6065% with grade 2 diastolic dysfunction. - Compensated   Hypothyroidism - Continue Synthroid  Diabetes mellitus with renal manifestations  - Continue with insulin sliding scale  OSA - on CPAP  CKD stage 3 - Baseline creatinine 1.7 and on this admission 1.7 - 1.9 range  Anemia of chronic disease - Secondary to CKD - Hemoglobin is 10.9, stable   Dysphagia, pharyngoesophageal phase - Per SLP dysphagia 3 diet   Hypokalemia - Due to GI losses - Supplemented  - Check BMP today  DVT Prophylaxis  -  Heparin subQ ordered while pt in hospital   Code Status: Full.  Family Communication:  plan of care discussed with the patient's wife at the bedside  Disposition Plan: to [facility] Friday 08/21/2014.   IV access:  Peripheral IV  Procedures and diagnostic studies:    Dg Chest 2 View 08/17/2014  No active cardiopulmonary disease.     Dg Abd Acute W/chest 08/17/2014 No evidence of active pulmonary disease. Normal nonobstructive bowel gas pattern.     Medical Consultants:  None   Other Consultants:  Physical therapy Social work  IAnti-Infectives:   Doxycycline    [name], MD  [facility] Pager [phone]  Time spent in minutes: 15 minutes  If 7PM-7AM, please contact night-coverage [email or web address] Password [facility] 08/20/2014, 11:54 AM   LOS: 2 days    HPI/Subjective: No acute overnight events. Patient has no nausea, no shortness of breath.   Objective: Filed Vitals:   08/20/14 0637 08/20/14 0640 08/20/14 0700 08/20/14 0821  BP: 200/66 193/65  187/67  Pulse: 51 51    Temp: 97.7 F (36.5 C)     TempSrc: Oral     Resp: 18     Height:      Weight:   71.4 kg (157 lb 6.5 oz)   SpO2: 92%       Intake/Output Summary (Last 24 hours) at 08/20/14 1154 Last data filed at 08/19/14 1449  Gross per 24 hour  Intake    120 ml  Output      0 ml  Net    120 ml    Exam:  General:  Pt is awake, no distress  Cardiovascular: Rhythm controlled, appreciate S1-S2  Respiratory: No wheezing, no crackles, bilateral air entry  Abdomen: Nontender, nondistended abdomen, appreciate bowel sounds  Extremities: No cyanosis, pulses palpable  Neuro: No focal deficits  Data Reviewed: Basic Metabolic Panel:  Recent Labs Lab 08/17/14 2126 08/18/14 0500 08/19/14 0437  NA 137  --  143  K 3.1*  --  3.1*  CL 97*  --  102  CO2 24  --  29  GLUCOSE 289*  --  131*  BUN 32*  --  34*  CREATININE 1.72*  --  1.93*  CALCIUM 8.7*  --  8.7*  MG  --  1.7  --    Liver Function  Tests:  Recent Labs Lab 08/17/14 2126  AST 27  ALT 12*  ALKPHOS 87  BILITOT 0.7  PROT 7.9  ALBUMIN 3.9    Recent Labs Lab 08/17/14 2145  LIPASE 23   No results for input(s): AMMONIA in the last 168 hours. CBC:  Recent Labs Lab 08/17/14 1108 08/17/14 2126 08/19/14 0437  WBC 8.2 7.8 7.8  NEUTROABS 6.5 7.1  --   HGB 12.7* 11.9* 10.9*  HCT 37.9* 36.6* 33.6*  MCV 94.1 95.8 94.9  PLT 226.0 198 176   Cardiac Enzymes: No results for input(s): CKTOTAL, CKMB, CKMBINDEX, TROPONINI in the last 168 hours. BNP: Invalid input(s): POCBNP CBG:  Recent Labs Lab 08/19/14 0735 08/19/14 1158 08/19/14 1652 08/19/14 2133 08/20/14 0801  GLUCAP 111* 115* 164* 158* 106*    Urine culture     Status: None   Collection Time: 08/17/14 10:08 PM  Result Value Ref Range Status   Specimen Description URINE, CLEAN CATCH  Final   Special Requests NONE  Final   Culture NO GROWTH Performed at [facility]   Final   Report Status 08/19/2014 FINAL  Final  Culture, blood (x 2)     Status: None (Preliminary result)   Collection Time: 08/18/14  1:45 AM  Result Value Ref Range Status   Specimen Description BLOOD LEFT ARM  Final   Special Requests BOTTLES DRAWN AEROBIC AND ANAEROBIC 8CC  Final   Culture   Final           BLOOD CULTURE RECEIVED NO GROWTH TO DATE CULTURE WILL BE HELD FOR 5 DAYS BEFORE ISSUING A FINAL NEGATIVE REPORT Performed at [facility]    Report Status PENDING  Incomplete  Culture, blood (x 2)     Status: None (Preliminary result)   Collection Time: 08/18/14  1:49 AM  Result Value Ref Range Status   Specimen Description BLOOD LEFT HAND  Final   Special Requests BOTTLES DRAWN AEROBIC ONLY 10CC  Final   Culture   Final           BLOOD CULTURE RECEIVED NO GROWTH TO DATE CULTURE WILL BE HELD FOR 5 DAYS BEFORE ISSUING A FINAL NEGATIVE REPORT Performed at [facility]    Report Status PENDING  Incomplete     Scheduled Meds: . cholecalciferol   1,000 Units Oral Daily  . citalopram  20 mg Oral Daily  . doxycycline  100 mg Oral Q12H  . famotidine  20 mg Oral BID  . heparin  5,000 Units Subcutaneous 3 times per day  . hydrALAZINE  25 mg Oral BID  . insulin aspart  0-9 Units Subcutaneous TID WC  . isosorbide mononitrate  30 mg Oral Daily  . levothyroxine  100 mcg Oral QAC breakfast  .  lipase/protease/amylase  12,000 Units Oral TID WC  . OLANZapine  5 mg Oral Daily  . QUEtiapine  50 mg Oral QHS  . tamsulosin  0.4 mg Oral QHS   Continuous Infusions: . sodium chloride Stopped (08/18/14 1243)

## 2014-08-20 NOTE — Discharge Summary (Addendum)
Physician Discharge Summary  Nathan Blackwell TMH:[id number] DOB: 04-Sep-1920 DOA: 08/17/2014  PCP: [name], MD  Admit date: 08/17/2014 Discharge date: 08/20/2014  Recommendations for Outpatient Follow-up:  Please note that we have reduced lasix to 40 mg daily instead of 80 mg. If renal function continues to trend up please consider holding lasix until it improves. We also reduced hydralazine to every 8 hours instead of every 6 hours Pepcid was changed to once a day instead of twice a day regimen  Potassium supplemented prior to discharge   Discharge Diagnoses:  Principal Problem:   Cough Active Problems:   Hypothyroidism   Diabetes   Depression   OSA (obstructive sleep apnea)   Essential hypertension   GERD   Stage 3 chronic renal impairment associated with type 2 diabetes mellitus   H/O Deep venous thrombosis - left femoral vein   CKD (chronic kidney disease), stage III   Dysphagia, pharyngoesophageal phase   Weakness   Nausea vomiting and diarrhea   Hypokalemia   Diastolic congestive heart failure    Discharge Condition: stable   Diet recommendation: as tolerated   History of present illness:  81 year old male with past medical history of hypertension, depression, diabetes, hypothyroidism, chronic diastlic CHF (last 2 D ECHO in 2011 with grade 2 DD preserved EF). He presented to PCP office 08/17/14 with concern for fever and cough for past 1 week prior to this admission. Cough did not subside with OTC meds. His PCP gave him steroids and Levaquin but soon after taking this he developed nausea, vomiting and diarrhea. Abd and chest x ray on admission did not show acute processes. He was started on empiric doxycycline for possible bronchitis.  We have stopped doxycycline once we received the result of his respiratory virus panel which was positive for metapneumovirus.  Hospital Course:   Assessment/Plan:    Principal Problem: Acute viral pneumonia secondary to  metapneumovirus / Acute respiratory failure with hypoxia - Seems to be viral pneumonia as the cause of the hypoxia. - Respiratory virus panel is positive for metapneumovirus - Doxycyline stopped 08/20/2014. - Respiratory status is stable - Blood cultures, Legionella and strep pneumonia are all negative.   Active Problems: Nausea vomiting and diarrhea - Possible acute viral gastroenteritis. No fevers and a normal white blood cell count. - All symptoms resolved.  Chronic diastolic CHF - 2-D echo in 2011 showed EF 6065% with grade 2 diastolic dysfunction. - Compensated  - Will resume lasix however at the lower dose 40 mg daily instead of 80 mg - Please monitor renal function while on lasix per SNF protocol   Hypothyroidism - Continue Synthroid on discharge   Diabetes mellitus with renal manifestations  - Recommend to resume [place] on discharge   OSA - on CPAP  CKD stage 3 - Baseline creatinine 1.7 and on this admission 1.7 - 1.9 range  Anemia of chronic disease - Secondary to CKD - Hemoglobin is 10.9, stable  - Pt did not require transfusion during this hospital stay   Dysphagia, pharyngoesophageal phase - Per SLP dysphagia 3 diet   Hypokalemia - Due to GI losses, lasix(pt was taking lasix at home) - Supplemented   DVT Prophylaxis  - Heparin subQ ordered while pt in hospital   Code Status: Full.  Family Communication: plan of care discussed with the patient's wife at the bedside     IV access:  Peripheral IV  Procedures and diagnostic studies:   Dg Chest 2 View 08/17/2014 No active cardiopulmonary disease.  Dg Abd Acute W/chest 08/17/2014 No evidence of active pulmonary disease. Normal nonobstructive bowel gas pattern.   Medical Consultants:  None   Other Consultants:  Physical therapy Social work  IAnti-Infectives:   Doxycycline stopped 08/20/2014.     Signed:  [name], MD  [facility] 08/20/2014, 9:52  PM  Pager #: [phone]  Time spent in minutes: more than 30 minutes   Discharge Exam: Filed Vitals:   08/20/14 1315  BP: 141/63  Pulse: 70  Temp: 97.5 F (36.4 C)  Resp: 18   Filed Vitals:   08/20/14 0640 08/20/14 0700 08/20/14 0821 08/20/14 1315  BP: 193/65  187/67 141/63  Pulse: 51   70  Temp:    97.5 F (36.4 C)  TempSrc:    Oral  Resp:    18  Height:      Weight:  71.4 kg (157 lb 6.5 oz)    SpO2:    94%    General: Pt is alert, not in acute distress Cardiovascular: Regular rate and rhythm, S1/S2 appreciated  Respiratory: Clear to auscultation bilaterally, no wheezing, no crackles, no rhonchi Abdominal: Soft, non tender, non distended, bowel sounds +, no guarding Extremities: no edema, no cyanosis, pulses palpable bilaterally DP and PT Neuro: Grossly nonfocal  Discharge Instructions  Discharge Instructions    Discharge instructions    Complete by:  As directed   Please note that we have reduced lasix to 40 mg daily instead of 80 mg. If renal function continues to trend up please consider holding lasix until it improves.            Medication List    STOP taking these medications        levofloxacin 500 MG tablet  Commonly known as:  LEVAQUIN     predniSONE 10 MG tablet  Commonly known as:  DELTASONE      TAKE these medications        bacitracin ophthalmic ointment  Place into both eyes 4 (four) times daily.     cholecalciferol 1000 UNITS tablet  Commonly known as:  VITAMIN D  Take 1,000 Units by mouth every morning.     citalopram 20 MG tablet  Commonly known as:  CELEXA  Take 1 tablet (20 mg total) by mouth every morning.     dextromethorphan-guaiFENesin 30-600 MG per 12 hr tablet  Commonly known as:  MUCINEX DM  Take 1 tablet by mouth 2 (two) times daily as needed for cough.     famotidine 20 MG tablet  Commonly known as:  PEPCID  Take 1 tablet (20 mg total) by mouth daily.     furosemide 40 MG tablet  Commonly known as:  LASIX   Take 1 tablet (40 mg total) by mouth daily.     glucose blood test strip  Use as directed once daily to check blood sugar.  Diagnosis code E11.9     hydrALAZINE 25 MG tablet  Commonly known as:  APRESOLINE  Take 1 tablet (25 mg total) by mouth 3 (three) times daily.     ipratropium-albuterol 0.5-2.5 (3) MG/3ML Soln  Commonly known as:  DUONEB  Take 3 mLs by nebulization every 6 (six) hours as needed.     isosorbide mononitrate 30 MG 24 hr tablet  Commonly known as:  IMDUR  Take 1 tablet (30 mg total) by mouth every morning.     lansoprazole 30 MG capsule  Commonly known as:  PREVACID  Take 1 capsule (30 mg total) by  mouth every morning.     levothyroxine 100 MCG tablet  Commonly known as:  SYNTHROID, LEVOTHROID  Take 1 tablet (100 mcg total) by mouth daily before breakfast.     lipase/protease/amylase 12000 UNITS Cpep capsule  Commonly known as:  CREON  Take 1 capsule (12,000 Units total) by mouth 3 (three) times daily with meals.     OLANZapine 5 MG tablet  Commonly known as:  ZYPREXA  Take 1 tablet (5 mg total) by mouth every morning.     potassium chloride 10 MEQ tablet  Commonly known as:  K-DUR  Take 1 tablet (10 mEq total) by mouth daily.     QUEtiapine 50 MG tablet  Commonly known as:  SEROQUEL  TAKE 1 TABLET (50 MG TOTAL) BY MOUTH AT BEDTIME.     sitaGLIPtin 50 MG tablet  Commonly known as:  JANUVIA  Take 1 tablet (50 mg total) by mouth every morning.     tamsulosin 0.4 MG Caps capsule  Commonly known as:  FLOMAX  Take 1 capsule (0.4 mg total) by mouth at bedtime.           Follow-up Information    Follow up with [name], MD. Schedule an appointment as soon as possible for a visit in 1 week.   Specialties:  Internal Medicine, Radiology   Why:  Follow up appt after recent hospitalization   Contact information:   [address] [place] [address] [phone]        The results of significant diagnostics from this hospitalization  (including imaging, microbiology, ancillary and laboratory) are listed below for reference.    Significant Diagnostic Studies: Dg Chest 2 View  08/17/2014   CLINICAL DATA:  81 year old diabetic hypertensive male with cough and congestion with shortness breath and chest pain for the past week. Initial encounter.  EXAM: CHEST  2 VIEW  COMPARISON:  05/24/2013 and 07/10/2012.  FINDINGS: Biapical pleural thickening without associated bony destruction similar to prior exam.  No infiltrate, congestive heart failure or pneumothorax.  Calcified mildly tortuous aorta.  Heart size top-normal.  IMPRESSION: No active cardiopulmonary disease.  Please see above.   Electronically Signed   By: Genia Del M.D.   On: 08/17/2014 11:14   Dg Abd Acute W/chest  08/17/2014   CLINICAL DATA:  Nausea and upper abdominal pain for 24 hours.  EXAM: DG ABDOMEN ACUTE W/ 1V CHEST  COMPARISON:  Chest 08/17/2014  FINDINGS: Normal heart size and pulmonary vascularity. No focal airspace disease or consolidation in the lungs. No blunting of costophrenic angles. No pneumothorax. Mediastinal contours appear intact.  Scattered gas and stool in the colon. No small or large bowel distention. No free intra-abdominal air. No abnormal air-fluid levels. No radiopaque stones. Visualized bones appear intact. Surgical clips in the right upper quadrant.  IMPRESSION: No evidence of active pulmonary disease. Normal nonobstructive bowel gas pattern.   Electronically Signed   By: Lucienne Capers M.D.   On: 08/17/2014 22:02    Microbiology: Recent Results (from the past 240 hour(s))  Urine culture     Status: None   Collection Time: 08/17/14 10:08 PM  Result Value Ref Range Status   Specimen Description URINE, CLEAN CATCH  Final   Special Requests NONE  Final   Colony Count NO GROWTH Performed at [facility]   Final   Culture NO GROWTH Performed at [facility]   Final   Report Status 08/19/2014 FINAL  Final  Culture, blood (x  2)     Status: None (Preliminary result)   Collection Time: 08/18/14  1:45 AM  Result Value Ref Range Status   Specimen Description BLOOD LEFT ARM  Final   Special Requests BOTTLES DRAWN AEROBIC AND ANAEROBIC 8CC  Final   Culture   Final           BLOOD CULTURE RECEIVED NO GROWTH TO DATE CULTURE WILL BE HELD FOR 5 DAYS BEFORE ISSUING A FINAL NEGATIVE REPORT Performed at [facility]    Report Status PENDING  Incomplete  Culture, blood (x 2)     Status: None (Preliminary result)   Collection Time: 08/18/14  1:49 AM  Result Value Ref Range Status   Specimen Description BLOOD LEFT HAND  Final   Special Requests BOTTLES DRAWN AEROBIC ONLY 10CC  Final   Culture   Final           BLOOD CULTURE RECEIVED NO GROWTH TO DATE CULTURE WILL BE HELD FOR 5 DAYS BEFORE ISSUING A FINAL NEGATIVE REPORT Performed at [facility]    Report Status PENDING  Incomplete  Respiratory virus panel     Status: Abnormal   Collection Time: 08/18/14  6:38 AM  Result Value Ref Range Status   Respiratory Syncytial Virus A Negative Negative Final   Respiratory Syncytial Virus B Negative Negative Final   Influenza A Negative Negative Final   Influenza B Negative Negative Final   Parainfluenza 1 Negative Negative Final   Parainfluenza 2 Negative Negative Final   Parainfluenza 3 Negative Negative Final   Metapneumovirus Positive (A) Negative Final   Rhinovirus Negative Negative Final   Adenovirus Negative Negative Final    Comment: (NOTE) Performed At: [facility] [address] [place], [place] [id number] Lindon Romp MD BO:[id number]      Labs: Basic Metabolic Panel:  Recent Labs Lab 08/17/14 2126 08/18/14 0500 08/19/14 0437 08/20/14 1215  NA 137  --  143 139  K 3.1*  --  3.1* 2.7*  CL 97*  --  102 103  CO2 24  --  29 26  GLUCOSE 289*  --  131* 181*  BUN 32*  --  34* 36*  CREATININE 1.72*  --  1.93* 1.72*  CALCIUM 8.7*  --  8.7* 8.5*  MG  --  1.7  --   --    Liver  Function Tests:  Recent Labs Lab 08/17/14 2126  AST 27  ALT 12*  ALKPHOS 87  BILITOT 0.7  PROT 7.9  ALBUMIN 3.9    Recent Labs Lab 08/17/14 2145  LIPASE 23   No results for input(s): AMMONIA in the last 168 hours. CBC:  Recent Labs Lab 08/17/14 1108 08/17/14 2126 08/19/14 0437  WBC 8.2 7.8 7.8  NEUTROABS 6.5 7.1  --   HGB 12.7* 11.9* 10.9*  HCT 37.9* 36.6* 33.6*  MCV 94.1 95.8 94.9  PLT 226.0 198 176   Cardiac Enzymes: No results for input(s): CKTOTAL, CKMB, CKMBINDEX, TROPONINI in the last 168 hours. BNP: BNP (last 3 results)  Recent Labs  08/18/14 0345  BNP 266.2*    ProBNP (last 3 results) No results for input(s): PROBNP in the last 8760 hours.  CBG:  Recent Labs Lab 08/19/14 1652 08/19/14 2133 08/20/14 0801 08/20/14 1219 08/20/14 1642  GLUCAP 164* 158* 106* 172* 124*

## 2014-08-20 NOTE — Discharge Instructions (Signed)

## 2014-08-20 NOTE — Progress Notes (Signed)
Patient's blood pressure high this am.  Gave prn Hydralazine per orders. Will recheck BP and continue to monitor patient.

## 2014-08-21 DIAGNOSIS — I5030 Unspecified diastolic (congestive) heart failure: Secondary | ICD-10-CM | POA: Diagnosis not present

## 2014-08-21 DIAGNOSIS — J1289 Other viral pneumonia: Secondary | ICD-10-CM | POA: Diagnosis not present

## 2014-08-21 DIAGNOSIS — F0150 Vascular dementia without behavioral disturbance: Secondary | ICD-10-CM | POA: Diagnosis not present

## 2014-08-21 DIAGNOSIS — M6281 Muscle weakness (generalized): Secondary | ICD-10-CM | POA: Diagnosis not present

## 2014-08-21 DIAGNOSIS — I5031 Acute diastolic (congestive) heart failure: Secondary | ICD-10-CM | POA: Diagnosis not present

## 2014-08-21 DIAGNOSIS — E039 Hypothyroidism, unspecified: Secondary | ICD-10-CM | POA: Diagnosis not present

## 2014-08-21 DIAGNOSIS — M810 Age-related osteoporosis without current pathological fracture: Secondary | ICD-10-CM | POA: Diagnosis not present

## 2014-08-21 DIAGNOSIS — J129 Viral pneumonia, unspecified: Secondary | ICD-10-CM | POA: Diagnosis not present

## 2014-08-21 DIAGNOSIS — E785 Hyperlipidemia, unspecified: Secondary | ICD-10-CM | POA: Diagnosis not present

## 2014-08-21 DIAGNOSIS — R278 Other lack of coordination: Secondary | ICD-10-CM | POA: Diagnosis not present

## 2014-08-21 DIAGNOSIS — R05 Cough: Secondary | ICD-10-CM | POA: Diagnosis not present

## 2014-08-21 DIAGNOSIS — I10 Essential (primary) hypertension: Secondary | ICD-10-CM | POA: Diagnosis not present

## 2014-08-21 DIAGNOSIS — F329 Major depressive disorder, single episode, unspecified: Secondary | ICD-10-CM | POA: Diagnosis not present

## 2014-08-21 DIAGNOSIS — N183 Chronic kidney disease, stage 3 (moderate): Secondary | ICD-10-CM | POA: Diagnosis not present

## 2014-08-21 DIAGNOSIS — J9601 Acute respiratory failure with hypoxia: Secondary | ICD-10-CM | POA: Diagnosis not present

## 2014-08-21 DIAGNOSIS — J211 Acute bronchiolitis due to human metapneumovirus: Secondary | ICD-10-CM | POA: Diagnosis not present

## 2014-08-21 DIAGNOSIS — K5900 Constipation, unspecified: Secondary | ICD-10-CM | POA: Diagnosis not present

## 2014-08-21 DIAGNOSIS — R112 Nausea with vomiting, unspecified: Secondary | ICD-10-CM | POA: Diagnosis not present

## 2014-08-21 DIAGNOSIS — N400 Enlarged prostate without lower urinary tract symptoms: Secondary | ICD-10-CM | POA: Diagnosis not present

## 2014-08-21 DIAGNOSIS — S00522A Blister (nonthermal) of oral cavity, initial encounter: Secondary | ICD-10-CM | POA: Diagnosis not present

## 2014-08-21 DIAGNOSIS — E119 Type 2 diabetes mellitus without complications: Secondary | ICD-10-CM | POA: Diagnosis not present

## 2014-08-21 DIAGNOSIS — H1030 Unspecified acute conjunctivitis, unspecified eye: Secondary | ICD-10-CM | POA: Diagnosis not present

## 2014-08-21 DIAGNOSIS — J189 Pneumonia, unspecified organism: Secondary | ICD-10-CM | POA: Diagnosis not present

## 2014-08-21 DIAGNOSIS — E569 Vitamin deficiency, unspecified: Secondary | ICD-10-CM | POA: Diagnosis not present

## 2014-08-21 DIAGNOSIS — G8929 Other chronic pain: Secondary | ICD-10-CM | POA: Diagnosis not present

## 2014-08-21 DIAGNOSIS — R1314 Dysphagia, pharyngoesophageal phase: Secondary | ICD-10-CM | POA: Diagnosis not present

## 2014-08-21 DIAGNOSIS — R197 Diarrhea, unspecified: Secondary | ICD-10-CM | POA: Diagnosis not present

## 2014-08-21 DIAGNOSIS — E860 Dehydration: Secondary | ICD-10-CM | POA: Diagnosis not present

## 2014-08-21 DIAGNOSIS — G4733 Obstructive sleep apnea (adult) (pediatric): Secondary | ICD-10-CM | POA: Diagnosis not present

## 2014-08-21 LAB — BASIC METABOLIC PANEL
Anion gap: 6 (ref 5–15)
BUN: 34 mg/dL — ABNORMAL HIGH (ref 6–20)
CO2: 26 mmol/L (ref 22–32)
CREATININE: 1.67 mg/dL — AB (ref 0.61–1.24)
Calcium: 8.2 mg/dL — ABNORMAL LOW (ref 8.9–10.3)
Chloride: 110 mmol/L (ref 101–111)
GFR calc Af Amer: 40 mL/min — ABNORMAL LOW (ref >60)
GFR calc non Af Amer: 34 mL/min — ABNORMAL LOW (ref >60)
GLUCOSE: 116 mg/dL — AB (ref 65–99)
Potassium: 3 mmol/L — ABNORMAL LOW (ref 3.5–5.1)
Sodium: 142 mmol/L (ref 135–145)

## 2014-08-21 LAB — GLUCOSE, CAPILLARY
Glucose-Capillary: 92 mg/dL (ref 65–99)
Glucose-Capillary: 161 mg/dL — ABNORMAL HIGH (ref 65–99)

## 2014-08-21 LAB — MAGNESIUM: MAGNESIUM: 1.8 mg/dL (ref 1.7–2.4)

## 2014-08-21 MED ORDER — POTASSIUM CHLORIDE CRYS ER 20 MEQ PO TBCR
40.0000 meq | EXTENDED_RELEASE_TABLET | Freq: Once | ORAL | Status: AC
  Administered 2014-08-21: 40 meq via ORAL
  Filled 2014-08-21: qty 2

## 2014-08-21 NOTE — Clinical Social Work Placement (Signed)
Patient is set to discharge to [facility] today. Patient & wife, [name] aware. Discharge packet given to RN, Tess. [facility] called for transport to pickup at 2:30pm.     Raynaldo Opitz, [place] Social Worker cell #: [phone]    CLINICAL SOCIAL WORK PLACEMENT  NOTE  Date:  08/21/2014  Patient Details  Name: Nathan Blackwell MRN: [id number] Date of Birth: 1921-01-26  Clinical Social Work is seeking post-discharge placement for this patient at the [place] level of care (*CSW will initial, date and re-position this form in  chart as items are completed):  Yes   Patient/family provided with [facility] Work Department's list of facilities offering this level of care within the geographic area requested by the patient (or if unable, by the patient's family).  Yes   Patient/family informed of their freedom to choose among providers that offer the needed level of care, that participate in Medicare, Medicaid or managed care program needed by the patient, have an available bed and are willing to accept the patient.  Yes   Patient/family informed of [facility]'s ownership interest in [facility] and [facility], as well as of the fact that they are under no obligation to receive care at these facilities.  PASRR submitted to EDS on 08/19/14     PASRR number received on 08/19/14     Existing PASRR number confirmed on       FL2 transmitted to all facilities in geographic area requested by pt/family on 08/19/14     FL2 transmitted to all facilities within larger geographic area on       Patient informed that his/her managed care company has contracts with or will negotiate with certain facilities, including the following:        Yes   Patient/family informed of bed offers received.  Patient chooses bed at [facility]     Physician recommends and patient chooses bed at      Patient to be transferred to  [facility] on 08/21/14.  Patient to be transferred to facility by [facility]     Patient family notified on 08/21/14 of transfer.  Name of family member notified:  patient's wife, [name]     PHYSICIAN       Additional Comment:    _______________________________________________ Standley Brooking, LCSW 08/21/2014, 11:26 AM

## 2014-08-21 NOTE — Progress Notes (Signed)
Patient had 5 consecutive unifocal PVCs. Not VT. Patient's vitals: 98.69F;HR 87;RR16;119/42;94% RA. Patient was asymptomatic. PCP on call to be notified.

## 2014-08-21 NOTE — Progress Notes (Signed)
Physical Therapy Treatment Patient Details Name: Nathan Blackwell MRN: [id number] DOB: April 01, 1920 Today's Date: 2014/09/13    History of Present Illness This 81 year old man was admitted with cough, fever, nausea, vomiting, diarhea.  He has a h/o HTN, DM, CHF, remote colon CA    PT Comments    PT paged by RN. Spouse wished to see pt's mobility prior to d/c to SNF.  Pt assisted with ambulating in hallway with cues for safety.  Spouse appeared more at ease.  Pt to d/c to SNF today.   Follow Up Recommendations  SNF     Equipment Recommendations  None recommended by PT    Recommendations for Other Services       Precautions / Restrictions Precautions Precautions: Fall    Mobility  Bed Mobility   Bed Mobility: Supine to Sit     Supine to sit: Supervision        Transfers Overall transfer level: Needs assistance Equipment used: Rolling walker (2 wheeled) Transfers: Sit to/from Stand Sit to Stand: Min assist         General transfer comment: assist to steady, verbal cues for safety, pt left behind RW upon returning to bed  Ambulation/Gait Ambulation/Gait assistance: Min guard Ambulation Distance (Feet): 140 Feet Assistive device: Rolling walker (2 wheeled) Gait Pattern/deviations: Step-through pattern;Narrow base of support;Shuffle     General Gait Details: continues to report fatigue near end of ambulation, denies SOB, SpO2 98% room air upon return to sitting   Stairs            Wheelchair Mobility    Modified Rankin (Stroke Patients Only)       Balance                                    Cognition Arousal/Alertness: Awake/alert Behavior During Therapy: WFL for tasks assessed/performed Overall Cognitive Status: Difficult to assess                      Exercises      General Comments        Pertinent Vitals/Pain Pain Assessment: No/denies pain    Home Living                      Prior Function             PT Goals (current goals can now be found in the care plan section) Progress towards PT goals: Progressing toward goals    Frequency  Min 3X/week    PT Plan Current plan remains appropriate    Co-evaluation             End of Session   Activity Tolerance: Patient tolerated treatment well Patient left: in bed;with call bell/phone within reach;with bed alarm set;with family/visitor present     Time: 8182-9937 PT Time Calculation (min) (ACUTE ONLY): 17 min  Charges:  $Gait Training: 8-22 mins                    G Codes:      Dustyn Armbrister,KATHrine E September 13, 2014, 2:19 PM Carmelia Bake, PT, DPT 09/13/2014 Pager: [phone]

## 2014-08-23 DIAGNOSIS — E119 Type 2 diabetes mellitus without complications: Secondary | ICD-10-CM | POA: Diagnosis not present

## 2014-08-23 DIAGNOSIS — J129 Viral pneumonia, unspecified: Secondary | ICD-10-CM | POA: Diagnosis not present

## 2014-08-23 DIAGNOSIS — I5031 Acute diastolic (congestive) heart failure: Secondary | ICD-10-CM | POA: Diagnosis not present

## 2014-08-23 DIAGNOSIS — F329 Major depressive disorder, single episode, unspecified: Secondary | ICD-10-CM | POA: Diagnosis not present

## 2014-08-23 DIAGNOSIS — G4733 Obstructive sleep apnea (adult) (pediatric): Secondary | ICD-10-CM | POA: Diagnosis not present

## 2014-08-24 LAB — CULTURE, BLOOD (ROUTINE X 2)
CULTURE: NO GROWTH
Culture: NO GROWTH

## 2014-08-31 ENCOUNTER — Other Ambulatory Visit: Payer: Self-pay

## 2014-09-01 DIAGNOSIS — F329 Major depressive disorder, single episode, unspecified: Secondary | ICD-10-CM | POA: Diagnosis not present

## 2014-09-22 ENCOUNTER — Ambulatory Visit (INDEPENDENT_AMBULATORY_CARE_PROVIDER_SITE_OTHER): Payer: Medicare Other | Admitting: Internal Medicine

## 2014-09-22 ENCOUNTER — Encounter: Payer: Self-pay | Admitting: Internal Medicine

## 2014-09-22 VITALS — BP 124/56 | HR 58 | Temp 97.4°F | Ht 72.0 in | Wt 159.0 lb

## 2014-09-22 DIAGNOSIS — N189 Chronic kidney disease, unspecified: Secondary | ICD-10-CM | POA: Diagnosis not present

## 2014-09-22 DIAGNOSIS — I5032 Chronic diastolic (congestive) heart failure: Secondary | ICD-10-CM

## 2014-09-22 DIAGNOSIS — R269 Unspecified abnormalities of gait and mobility: Secondary | ICD-10-CM | POA: Diagnosis not present

## 2014-09-22 DIAGNOSIS — J129 Viral pneumonia, unspecified: Secondary | ICD-10-CM

## 2014-09-22 DIAGNOSIS — E1122 Type 2 diabetes mellitus with diabetic chronic kidney disease: Secondary | ICD-10-CM

## 2014-09-22 NOTE — Patient Instructions (Signed)
Please continue all other medications as before, and refills have been done if requested.  Please have the pharmacy call with any other refills you may need.  Please continue your efforts at being more active, low cholesterol diet, and weight control.  Please keep your appointments with your specialists as you may have planned  You will be contacted regarding the referral for: Home Health and Physical Therapy

## 2014-09-22 NOTE — Progress Notes (Signed)
Subjective:    Patient ID: Nathan Blackwell, male    DOB: Aug 26, 1920, 81 y.o.   MRN: [id number]  HPI  Here to f/u -  S/p recent viral pna then 3 wk rehab sta. overall doing ok,  Pt denies chest pain, increasing sob or doe, wheezing, orthopnea, PND, increased LE swelling, palpitations, dizziness or syncope.  Pt denies new neurological symptoms such as new headache, or facial or extremity weakness or numbness.  Pt denies polydipsia, polyuria, or low sugar episode.   Pt denies new neurological symptoms such as new headache, or facial or extremity weakness or numbness.   Pt states overall good compliance with meds, mostly trying to follow appropriate diet, with wt overall stable.  Needs cont'd home PT, still with ambulation difficulty Wt Readings from Last 3 Encounters:  09/22/14 159 lb (72.122 kg)  08/21/14 156 lb 4.9 oz (70.9 kg)  08/17/14 163 lb (73.936 kg)   Past Medical History  Diagnosis Date  . Diabetes mellitus type II   . Hyperlipidemia   . Hypertension   . Osteoporosis   . Prostatic hypertrophy     benign  . OSA (obstructive sleep apnea)   . Rotator cuff syndrome     chronic  . Elevated PSA   . Hypothyroidism   . Nephrolithiasis   . Thyroid nodule   . GERD (gastroesophageal reflux disease)   . Colitis, Clostridium difficile     presumed 11/08  . Dementia   . Anemia     NOS  . Vitamin B 12 deficiency   . BRADYCARDIA 06/10/2009  . COLON CANCER, HX OF 09/20/2006  . HYPOTHYROIDISM 01/14/2007  . DIABETES MELLITUS, TYPE II 09/20/2006  . HYPERLIPIDEMIA 09/20/2006  . ANEMIA-NOS 06/23/2008  . DEMENTIA 05/14/2007  . DEPRESSION 02/27/2007  . SLEEP APNEA, OBSTRUCTIVE 01/14/2007  . HYPERTENSION 09/20/2006  . GERD 01/14/2007  . OSTEOPOROSIS 10/08/2006  . PERIPHERAL EDEMA 09/03/2007  . RENAL INSUFFICIENCY 04/01/2010  . B12 deficiency 05/25/2010  . CLOSTRIDIUM DIFFICILE COLITIS 01/30/2007  . THYROID NODULE 01/14/2007  . CONSTIPATION, RECURRENT 01/06/2010  . BENIGN PROSTATIC HYPERTROPHY  10/08/2006  . ARTHRITIS 02/23/2007  . BACK PAIN, THORACIC REGION 06/04/2008  . COLONIC POLYPS, HX OF 10/08/2006  . NEPHROLITHIASIS, HX OF 01/14/2007  . Cancer of colon dx'd 1998  . Cancer of vocal cord dx'd 1998  . 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 DVT of lower limb, acute 01/12/2011  . Sleep apnea     CPAP dependent  . Diastolic congestive heart failure   . Hypothyroidism   . Diabetes mellitus    Past Surgical History  Procedure Laterality Date  . Cholecystectomy  1999  . Inguinal herniorrhapy  1984  . Appendectomy  1998  . Colon resection  1998  . Skin cancer extraction      right ear-extensive scar  . Cataract extraction, bilateral    . Nm lexiscan myoview ltd  09/02/2013    Normal LV function and wall motion. EF 62%; rate dependent LBBB with Lexiscan, Low Risk fixed inferior defect suggestive of diaphragmatic attenuation and not infarct with normal wall motion.  . Transthoracic echocardiogram  08/03/2009    Normal LV size. Mild LVH. EF 60-65%. Gr 2 DD, mild MR.  . Esophagogastroduodenoscopy (egd) with propofol N/A 12/19/2013    Procedure: ESOPHAGOGASTRODUODENOSCOPY (EGD) WITH PROPOFOL;  Surgeon: Inda Castle, MD;  Location: [facility];  Service: Endoscopy;  Laterality: N/A;  . Savory dilation N/A 12/19/2013    Procedure: SAVORY DILATION;  Surgeon: Sandy Salaam  Deatra Ina, MD;  Location: [facility];  Service: Endoscopy;  Laterality: N/A;  With Fluoroscopy  . Esophagogastroduodenoscopy N/A 12/30/2013    Procedure: ESOPHAGOGASTRODUODENOSCOPY (EGD);  Surgeon: Inda Castle, MD;  Location: [facility];  Service: Endoscopy;  Laterality: N/A;  . Savory dilation N/A 12/30/2013    Procedure: SAVORY DILATION;  Surgeon: Inda Castle, MD;  Location: [facility];  Service: Endoscopy;  Laterality: N/A;    reports that he has never smoked. He has never used smokeless tobacco. He reports that he does not drink alcohol or use illicit drugs. family history includes Diabetes in his mother;  Stroke in his father. Allergies  Allergen Reactions  . Biaxin [Clarithromycin] Other (See Comments)    Unknown   . Other Other (See Comments)    Nephrologist has told patient not to eat many different foods (tomatoes, [employer], etc).   . Sulfa Antibiotics Other (See Comments)    Unknown    Current Outpatient Prescriptions on File Prior to Visit  Medication Sig Dispense Refill  . cholecalciferol (VITAMIN D) 1000 UNITS tablet Take 1,000 Units by mouth every morning.     . citalopram (CELEXA) 20 MG tablet Take 1 tablet (20 mg total) by mouth every morning. 30 tablet 0  . dextromethorphan-guaiFENesin (MUCINEX DM) 30-600 MG per 12 hr tablet Take 1 tablet by mouth 2 (two) times daily as needed for cough. 60 tablet 0  . famotidine (PEPCID) 20 MG tablet Take 1 tablet (20 mg total) by mouth daily. 30 tablet 0  . furosemide (LASIX) 40 MG tablet Take 1 tablet (40 mg total) by mouth daily. 30 tablet 0  . glucose blood test strip Use as directed once daily to check blood sugar.  Diagnosis code E11.9 300 each 3  . hydrALAZINE (APRESOLINE) 25 MG tablet Take 1 tablet (25 mg total) by mouth 3 (three) times daily. 90 tablet 0  . ipratropium-albuterol (DUONEB) 0.5-2.5 (3) MG/3ML SOLN Take 3 mLs by nebulization every 6 (six) hours as needed. 360 mL 0  . isosorbide mononitrate (IMDUR) 30 MG 24 hr tablet Take 1 tablet (30 mg total) by mouth every morning. 90 tablet 3  . lansoprazole (PREVACID) 30 MG capsule Take 1 capsule (30 mg total) by mouth every morning. 90 capsule 3  . levothyroxine (SYNTHROID, LEVOTHROID) 100 MCG tablet Take 1 tablet (100 mcg total) by mouth daily before breakfast. 90 tablet 3  . lipase/protease/amylase (CREON) 12000 UNITS CPEP capsule Take 1 capsule (12,000 Units total) by mouth 3 (three) times daily with meals. 270 capsule 3  . OLANZapine (ZYPREXA) 5 MG tablet Take 1 tablet (5 mg total) by mouth every morning. 30 tablet 0  . potassium chloride (K-DUR) 10 MEQ tablet Take 1 tablet (10  mEq total) by mouth daily. 30 tablet 0  . QUEtiapine (SEROQUEL) 50 MG tablet TAKE 1 TABLET (50 MG TOTAL) BY MOUTH AT BEDTIME. 30 tablet 0  . sitaGLIPtin (JANUVIA) 50 MG tablet Take 1 tablet (50 mg total) by mouth every morning. 90 tablet 3  . tamsulosin (FLOMAX) 0.4 MG CAPS capsule Take 1 capsule (0.4 mg total) by mouth at bedtime. 90 capsule 3  . bacitracin ophthalmic ointment Place into both eyes 4 (four) times daily. (Patient not taking: Reported on 09/22/2014) 3.5 g 2   No current facility-administered medications on file prior to visit.   Review of Systems  Constitutional: Negative for unusual diaphoresis or night sweats HENT: Negative for ringing in ear or discharge Eyes: Negative for double vision or worsening visual  disturbance.  Respiratory: Negative for choking and stridor.   Gastrointestinal: Negative for vomiting or other signifcant bowel change Genitourinary: Negative for hematuria or change in urine volume.  Musculoskeletal: Negative for other MSK pain or swelling Skin: Negative for color change and worsening wound.  Neurological: Negative for tremors and numbness other than noted  Psychiatric/Behavioral: Negative for decreased concentration or agitation other than above       Objective:   Physical Exam BP 124/56 mmHg  Pulse 58  Temp(Src) 97.4 F (36.3 C) (Oral)  Ht 6' (1.829 m)  Wt 159 lb (72.122 kg)  BMI 21.56 kg/m2  SpO2 97% VS noted,  Constitutional: Pt appears in no significant distress HENT: Head: NCAT.  Right Ear: External ear normal.  Left Ear: External ear normal.  Eyes: . Pupils are equal, round, and reactive to light. Conjunctivae and EOM are normal Neck: Normal range of motion. Neck supple.  Cardiovascular: Normal rate and regular rhythm.   Pulmonary/Chest: Effort normal and breath sounds without rales or wheezing.  Neurological: Pt is alert. Not confused , motor grossly intact Skin: Skin is warm. No rash, no LE edema Psychiatric: Pt behavior is  normal. No agitation.     Assessment & Plan:

## 2014-09-24 DIAGNOSIS — E1151 Type 2 diabetes mellitus with diabetic peripheral angiopathy without gangrene: Secondary | ICD-10-CM | POA: Diagnosis not present

## 2014-09-24 DIAGNOSIS — L602 Onychogryphosis: Secondary | ICD-10-CM | POA: Diagnosis not present

## 2014-09-24 DIAGNOSIS — Z8701 Personal history of pneumonia (recurrent): Secondary | ICD-10-CM | POA: Diagnosis not present

## 2014-09-24 DIAGNOSIS — R269 Unspecified abnormalities of gait and mobility: Secondary | ICD-10-CM | POA: Diagnosis not present

## 2014-09-26 DIAGNOSIS — R269 Unspecified abnormalities of gait and mobility: Secondary | ICD-10-CM | POA: Insufficient documentation

## 2014-09-26 NOTE — Assessment & Plan Note (Signed)
stable overall by history and exam, recent data reviewed with pt, and pt to continue medical treatment as before,  to f/u any worsening symptoms or concerns Lab Results  Component Value Date   WBC 7.8 08/19/2014   HGB 10.9* 08/19/2014   HCT 33.6* 08/19/2014   PLT 176 08/19/2014   GLUCOSE 116* 08/21/2014   CHOL 208* 05/05/2014   TRIG 268.0* 05/05/2014   HDL 40.30 05/05/2014   LDLDIRECT 139.0 05/05/2014   LDLCALC 92 09/21/2011   ALT 12* 08/17/2014   AST 27 08/17/2014   NA 142 08/21/2014   K 3.0* 08/21/2014   CL 110 08/21/2014   CREATININE 1.67* 08/21/2014   BUN 34* 08/21/2014   CO2 26 08/21/2014   TSH 0.62 05/05/2014   PSA 4.88* 01/14/2007   INR 1.12 08/18/2014   HGBA1C 7.2* 08/17/2014   MICROALBUR 6.3* 05/05/2014

## 2014-09-26 NOTE — Assessment & Plan Note (Signed)
stable overall by history and exam, recent data reviewed with pt, and pt to continue medical treatment as before,  to f/u any worsening symptoms or concerns SpO2 Readings from Last 3 Encounters:  09/22/14 97%  08/21/14 95%  08/17/14 97%

## 2014-09-26 NOTE — Assessment & Plan Note (Signed)
stable overall by history and exam, recent data reviewed with pt, and pt to continue medical treatment as before,  to f/u any worsening symptoms or concerns Lab Results  Component Value Date   HGBA1C 7.2* 08/17/2014

## 2014-09-26 NOTE — Assessment & Plan Note (Signed)
Ok for [facility] PT,  to f/u any worsening symptoms or concerns

## 2014-09-29 DIAGNOSIS — Z8701 Personal history of pneumonia (recurrent): Secondary | ICD-10-CM | POA: Diagnosis not present

## 2014-09-29 DIAGNOSIS — R269 Unspecified abnormalities of gait and mobility: Secondary | ICD-10-CM | POA: Diagnosis not present

## 2014-10-01 DIAGNOSIS — Z8701 Personal history of pneumonia (recurrent): Secondary | ICD-10-CM | POA: Diagnosis not present

## 2014-10-01 DIAGNOSIS — R269 Unspecified abnormalities of gait and mobility: Secondary | ICD-10-CM | POA: Diagnosis not present

## 2014-10-06 ENCOUNTER — Encounter: Payer: Self-pay | Admitting: Gastroenterology

## 2014-10-06 DIAGNOSIS — R269 Unspecified abnormalities of gait and mobility: Secondary | ICD-10-CM | POA: Diagnosis not present

## 2014-10-06 DIAGNOSIS — Z8701 Personal history of pneumonia (recurrent): Secondary | ICD-10-CM | POA: Diagnosis not present

## 2014-10-09 DIAGNOSIS — Z8701 Personal history of pneumonia (recurrent): Secondary | ICD-10-CM | POA: Diagnosis not present

## 2014-10-09 DIAGNOSIS — R269 Unspecified abnormalities of gait and mobility: Secondary | ICD-10-CM | POA: Diagnosis not present

## 2014-10-13 DIAGNOSIS — Z8701 Personal history of pneumonia (recurrent): Secondary | ICD-10-CM | POA: Diagnosis not present

## 2014-10-13 DIAGNOSIS — R269 Unspecified abnormalities of gait and mobility: Secondary | ICD-10-CM | POA: Diagnosis not present

## 2014-10-16 DIAGNOSIS — Z8701 Personal history of pneumonia (recurrent): Secondary | ICD-10-CM | POA: Diagnosis not present

## 2014-10-16 DIAGNOSIS — R269 Unspecified abnormalities of gait and mobility: Secondary | ICD-10-CM | POA: Diagnosis not present

## 2014-10-19 ENCOUNTER — Emergency Department (HOSPITAL_COMMUNITY): Payer: Medicare Other

## 2014-10-19 ENCOUNTER — Encounter (HOSPITAL_COMMUNITY): Payer: Self-pay | Admitting: Emergency Medicine

## 2014-10-19 ENCOUNTER — Inpatient Hospital Stay (HOSPITAL_COMMUNITY)
Admission: EM | Admit: 2014-10-19 | Discharge: 2014-10-22 | DRG: 683 | Disposition: A | Discharge status: Home Health | Payer: Medicare Other | Attending: Internal Medicine | Admitting: Internal Medicine

## 2014-10-19 DIAGNOSIS — M810 Age-related osteoporosis without current pathological fracture: Secondary | ICD-10-CM | POA: Diagnosis present

## 2014-10-19 DIAGNOSIS — Z823 Family history of stroke: Secondary | ICD-10-CM | POA: Diagnosis not present

## 2014-10-19 DIAGNOSIS — N400 Enlarged prostate without lower urinary tract symptoms: Secondary | ICD-10-CM | POA: Diagnosis present

## 2014-10-19 DIAGNOSIS — F329 Major depressive disorder, single episode, unspecified: Secondary | ICD-10-CM | POA: Diagnosis present

## 2014-10-19 DIAGNOSIS — R42 Dizziness and giddiness: Secondary | ICD-10-CM

## 2014-10-19 DIAGNOSIS — I10 Essential (primary) hypertension: Secondary | ICD-10-CM | POA: Diagnosis present

## 2014-10-19 DIAGNOSIS — I674 Hypertensive encephalopathy: Secondary | ICD-10-CM

## 2014-10-19 DIAGNOSIS — E039 Hypothyroidism, unspecified: Secondary | ICD-10-CM | POA: Diagnosis present

## 2014-10-19 DIAGNOSIS — R55 Syncope and collapse: Secondary | ICD-10-CM | POA: Diagnosis not present

## 2014-10-19 DIAGNOSIS — Z833 Family history of diabetes mellitus: Secondary | ICD-10-CM

## 2014-10-19 DIAGNOSIS — I5030 Unspecified diastolic (congestive) heart failure: Secondary | ICD-10-CM | POA: Diagnosis present

## 2014-10-19 DIAGNOSIS — K219 Gastro-esophageal reflux disease without esophagitis: Secondary | ICD-10-CM | POA: Diagnosis present

## 2014-10-19 DIAGNOSIS — I5032 Chronic diastolic (congestive) heart failure: Secondary | ICD-10-CM | POA: Diagnosis not present

## 2014-10-19 DIAGNOSIS — I248 Other forms of acute ischemic heart disease: Secondary | ICD-10-CM | POA: Diagnosis present

## 2014-10-19 DIAGNOSIS — Z86718 Personal history of other venous thrombosis and embolism: Secondary | ICD-10-CM | POA: Diagnosis not present

## 2014-10-19 DIAGNOSIS — Z6822 Body mass index (BMI) 22.0-22.9, adult: Secondary | ICD-10-CM

## 2014-10-19 DIAGNOSIS — Z85038 Personal history of other malignant neoplasm of large intestine: Secondary | ICD-10-CM | POA: Diagnosis not present

## 2014-10-19 DIAGNOSIS — R1110 Vomiting, unspecified: Secondary | ICD-10-CM

## 2014-10-19 DIAGNOSIS — R7989 Other specified abnormal findings of blood chemistry: Secondary | ICD-10-CM | POA: Diagnosis present

## 2014-10-19 DIAGNOSIS — N183 Chronic kidney disease, stage 3 (moderate): Secondary | ICD-10-CM

## 2014-10-19 DIAGNOSIS — N189 Chronic kidney disease, unspecified: Secondary | ICD-10-CM

## 2014-10-19 DIAGNOSIS — R778 Other specified abnormalities of plasma proteins: Secondary | ICD-10-CM | POA: Diagnosis present

## 2014-10-19 DIAGNOSIS — G4733 Obstructive sleep apnea (adult) (pediatric): Secondary | ICD-10-CM | POA: Diagnosis present

## 2014-10-19 DIAGNOSIS — I129 Hypertensive chronic kidney disease with stage 1 through stage 4 chronic kidney disease, or unspecified chronic kidney disease: Principal | ICD-10-CM | POA: Diagnosis present

## 2014-10-19 DIAGNOSIS — E1122 Type 2 diabetes mellitus with diabetic chronic kidney disease: Secondary | ICD-10-CM | POA: Diagnosis present

## 2014-10-19 DIAGNOSIS — Z85828 Personal history of other malignant neoplasm of skin: Secondary | ICD-10-CM

## 2014-10-19 DIAGNOSIS — E785 Hyperlipidemia, unspecified: Secondary | ICD-10-CM | POA: Diagnosis present

## 2014-10-19 DIAGNOSIS — F0390 Unspecified dementia without behavioral disturbance: Secondary | ICD-10-CM | POA: Diagnosis present

## 2014-10-19 DIAGNOSIS — H9190 Unspecified hearing loss, unspecified ear: Secondary | ICD-10-CM | POA: Diagnosis present

## 2014-10-19 DIAGNOSIS — R112 Nausea with vomiting, unspecified: Secondary | ICD-10-CM | POA: Diagnosis not present

## 2014-10-19 DIAGNOSIS — R404 Transient alteration of awareness: Secondary | ICD-10-CM | POA: Diagnosis not present

## 2014-10-19 DIAGNOSIS — Z8521 Personal history of malignant neoplasm of larynx: Secondary | ICD-10-CM

## 2014-10-19 DIAGNOSIS — Z66 Do not resuscitate: Secondary | ICD-10-CM | POA: Diagnosis present

## 2014-10-19 DIAGNOSIS — E119 Type 2 diabetes mellitus without complications: Secondary | ICD-10-CM

## 2014-10-19 DIAGNOSIS — E440 Moderate protein-calorie malnutrition: Secondary | ICD-10-CM | POA: Insufficient documentation

## 2014-10-19 DIAGNOSIS — R140 Abdominal distension (gaseous): Secondary | ICD-10-CM | POA: Diagnosis not present

## 2014-10-19 DIAGNOSIS — R930 Abnormal findings on diagnostic imaging of skull and head, not elsewhere classified: Secondary | ICD-10-CM | POA: Diagnosis not present

## 2014-10-19 DIAGNOSIS — I160 Hypertensive urgency: Secondary | ICD-10-CM | POA: Diagnosis present

## 2014-10-19 DIAGNOSIS — I672 Cerebral atherosclerosis: Secondary | ICD-10-CM | POA: Diagnosis not present

## 2014-10-19 LAB — COMPREHENSIVE METABOLIC PANEL
ALBUMIN: 3.8 g/dL (ref 3.5–5.0)
ALT: 12 U/L — ABNORMAL LOW (ref 17–63)
AST: 20 U/L (ref 15–41)
Alkaline Phosphatase: 73 U/L (ref 38–126)
Anion gap: 12 (ref 5–15)
BUN: 31 mg/dL — AB (ref 6–20)
CO2: 25 mmol/L (ref 22–32)
Calcium: 8.5 mg/dL — ABNORMAL LOW (ref 8.9–10.3)
Chloride: 105 mmol/L (ref 101–111)
Creatinine, Ser: 1.56 mg/dL — ABNORMAL HIGH (ref 0.61–1.24)
GFR calc Af Amer: 43 mL/min — ABNORMAL LOW (ref >60)
GFR calc non Af Amer: 37 mL/min — ABNORMAL LOW (ref >60)
GLUCOSE: 211 mg/dL — AB (ref 65–99)
POTASSIUM: 3.6 mmol/L (ref 3.5–5.1)
SODIUM: 142 mmol/L (ref 135–145)
Total Bilirubin: 0.7 mg/dL (ref 0.3–1.2)
Total Protein: 7.1 g/dL (ref 6.5–8.1)

## 2014-10-19 LAB — CBC
HEMATOCRIT: 37.7 % — AB (ref 39.0–52.0)
Hemoglobin: 12.5 g/dL — ABNORMAL LOW (ref 13.0–17.0)
MCH: 31.6 pg (ref 26.0–34.0)
MCHC: 33.2 g/dL (ref 30.0–36.0)
MCV: 95.4 fL (ref 78.0–100.0)
Platelets: 212 10*3/uL (ref 150–400)
RBC: 3.95 MIL/uL — ABNORMAL LOW (ref 4.22–5.81)
RDW: 13.4 % (ref 11.5–15.5)
WBC: 10.1 10*3/uL (ref 4.0–10.5)

## 2014-10-19 LAB — URINALYSIS, ROUTINE W REFLEX MICROSCOPIC
BILIRUBIN URINE: NEGATIVE
Glucose, UA: 100 mg/dL — AB
Ketones, ur: NEGATIVE mg/dL
Leukocytes, UA: NEGATIVE
NITRITE: NEGATIVE
PH: 7 (ref 5.0–8.0)
Protein, ur: 100 mg/dL — AB
Specific Gravity, Urine: 1.009 (ref 1.005–1.030)
Urobilinogen, UA: 0.2 mg/dL (ref 0.0–1.0)

## 2014-10-19 LAB — LIPASE, BLOOD: LIPASE: 23 U/L (ref 22–51)

## 2014-10-19 LAB — I-STAT TROPONIN, ED: Troponin i, poc: 0.02 ng/mL (ref 0.00–0.08)

## 2014-10-19 LAB — I-STAT CG4 LACTIC ACID, ED
LACTIC ACID, VENOUS: 1.29 mmol/L (ref 0.5–2.0)
Lactic Acid, Venous: 1.26 mmol/L (ref 0.5–2.0)

## 2014-10-19 LAB — BLOOD GAS, VENOUS
Acid-Base Excess: 1.2 mmol/L (ref 0.0–2.0)
Bicarbonate: 26.1 mEq/L — ABNORMAL HIGH (ref 20.0–24.0)
FIO2: 0.21
O2 Saturation: 83 %
PCO2 VEN: 44.8 mmHg — AB (ref 45.0–50.0)
PH VEN: 7.384 — AB (ref 7.250–7.300)
Patient temperature: 98.6
TCO2: 23.3 mmol/L (ref 0–100)
pO2, Ven: 50.4 mmHg — ABNORMAL HIGH (ref 30.0–45.0)

## 2014-10-19 LAB — TROPONIN I: TROPONIN I: 0.04 ng/mL — AB (ref <0.031)

## 2014-10-19 LAB — URINE MICROSCOPIC-ADD ON

## 2014-10-19 LAB — GLUCOSE, CAPILLARY: Glucose-Capillary: 154 mg/dL — ABNORMAL HIGH (ref 65–99)

## 2014-10-19 MED ORDER — TAMSULOSIN HCL 0.4 MG PO CAPS
0.4000 mg | ORAL_CAPSULE | Freq: Every day | ORAL | Status: DC
  Administered 2014-10-20 – 2014-10-21 (×3): 0.4 mg via ORAL
  Filled 2014-10-19 (×3): qty 1

## 2014-10-19 MED ORDER — METOCLOPRAMIDE HCL 5 MG/ML IJ SOLN
10.0000 mg | Freq: Four times a day (QID) | INTRAMUSCULAR | Status: DC
  Administered 2014-10-20 (×3): 10 mg via INTRAVENOUS
  Filled 2014-10-19 (×3): qty 2

## 2014-10-19 MED ORDER — FAMOTIDINE 20 MG PO TABS
20.0000 mg | ORAL_TABLET | Freq: Every day | ORAL | Status: DC
  Administered 2014-10-20 – 2014-10-22 (×3): 20 mg via ORAL
  Filled 2014-10-19 (×3): qty 1

## 2014-10-19 MED ORDER — LEVOTHYROXINE SODIUM 100 MCG PO TABS
100.0000 ug | ORAL_TABLET | Freq: Every day | ORAL | Status: DC
  Administered 2014-10-20 – 2014-10-22 (×3): 100 ug via ORAL
  Filled 2014-10-19 (×3): qty 1

## 2014-10-19 MED ORDER — PROMETHAZINE HCL 25 MG/ML IJ SOLN
12.5000 mg | Freq: Once | INTRAMUSCULAR | Status: DC
  Filled 2014-10-19: qty 1

## 2014-10-19 MED ORDER — HYDRALAZINE HCL 25 MG PO TABS
25.0000 mg | ORAL_TABLET | Freq: Three times a day (TID) | ORAL | Status: DC
  Filled 2014-10-19: qty 1

## 2014-10-19 MED ORDER — METOCLOPRAMIDE HCL 5 MG/ML IJ SOLN
10.0000 mg | Freq: Once | INTRAMUSCULAR | Status: AC
  Administered 2014-10-19: 10 mg via INTRAVENOUS
  Filled 2014-10-19: qty 2

## 2014-10-19 MED ORDER — ONDANSETRON HCL 4 MG/2ML IJ SOLN
4.0000 mg | Freq: Once | INTRAMUSCULAR | Status: AC | PRN
  Administered 2014-10-19: 4 mg via INTRAVENOUS
  Filled 2014-10-19: qty 2

## 2014-10-19 MED ORDER — ENOXAPARIN SODIUM 40 MG/0.4ML ~~LOC~~ SOLN
40.0000 mg | Freq: Every day | SUBCUTANEOUS | Status: DC
  Administered 2014-10-20 (×2): 40 mg via SUBCUTANEOUS
  Filled 2014-10-19 (×2): qty 0.4

## 2014-10-19 MED ORDER — ONDANSETRON HCL 4 MG/2ML IJ SOLN
4.0000 mg | Freq: Once | INTRAMUSCULAR | Status: AC
  Administered 2014-10-19: 4 mg via INTRAVENOUS

## 2014-10-19 MED ORDER — HYDRALAZINE HCL 20 MG/ML IJ SOLN
10.0000 mg | INTRAMUSCULAR | Status: DC | PRN
  Administered 2014-10-20: 10 mg via INTRAVENOUS
  Filled 2014-10-19: qty 1

## 2014-10-19 MED ORDER — PANCRELIPASE (LIP-PROT-AMYL) 12000-38000 UNITS PO CPEP
12000.0000 [IU] | ORAL_CAPSULE | Freq: Three times a day (TID) | ORAL | Status: DC
  Administered 2014-10-20 – 2014-10-22 (×7): 12000 [IU] via ORAL
  Filled 2014-10-19 (×11): qty 1

## 2014-10-19 MED ORDER — INSULIN ASPART 100 UNIT/ML ~~LOC~~ SOLN: 0.0000 [IU] | Freq: Every day | SUBCUTANEOUS | Status: DC

## 2014-10-19 MED ORDER — HYDROCODONE-ACETAMINOPHEN 5-325 MG PO TABS: 1.0000 | ORAL_TABLET | ORAL | Status: DC | PRN

## 2014-10-19 MED ORDER — INSULIN ASPART 100 UNIT/ML ~~LOC~~ SOLN
0.0000 [IU] | Freq: Three times a day (TID) | SUBCUTANEOUS | Status: DC
  Administered 2014-10-20: 2 [IU] via SUBCUTANEOUS
  Administered 2014-10-20: 1 [IU] via SUBCUTANEOUS
  Administered 2014-10-20 – 2014-10-21 (×3): 2 [IU] via SUBCUTANEOUS

## 2014-10-19 MED ORDER — ASPIRIN EC 81 MG PO TBEC
81.0000 mg | DELAYED_RELEASE_TABLET | Freq: Every day | ORAL | Status: DC
Start: 2014-10-19 — End: 2014-10-22
  Administered 2014-10-20 – 2014-10-22 (×4): 81 mg via ORAL
  Filled 2014-10-19 (×4): qty 1

## 2014-10-19 MED ORDER — BISACODYL 10 MG RE SUPP: 10.0000 mg | Freq: Every day | RECTAL | Status: DC | PRN

## 2014-10-19 MED ORDER — SENNA 8.6 MG PO TABS
1.0000 | ORAL_TABLET | Freq: Two times a day (BID) | ORAL | Status: DC
  Administered 2014-10-20 – 2014-10-22 (×5): 8.6 mg via ORAL
  Filled 2014-10-19 (×6): qty 1

## 2014-10-19 MED ORDER — MECLIZINE HCL 25 MG PO TABS
12.5000 mg | ORAL_TABLET | Freq: Two times a day (BID) | ORAL | Status: DC | PRN
Start: 2014-10-19 — End: 2014-10-20

## 2014-10-19 MED ORDER — ONDANSETRON HCL 4 MG/2ML IJ SOLN
INTRAMUSCULAR | Status: AC
  Filled 2014-10-19: qty 2

## 2014-10-19 MED ORDER — ISOSORBIDE MONONITRATE ER 30 MG PO TB24
30.0000 mg | ORAL_TABLET | Freq: Every day | ORAL | Status: DC
  Administered 2014-10-20 – 2014-10-22 (×4): 30 mg via ORAL
  Filled 2014-10-19 (×3): qty 1

## 2014-10-19 MED ORDER — SODIUM CHLORIDE 0.9 % IJ SOLN
3.0000 mL | Freq: Two times a day (BID) | INTRAMUSCULAR | Status: DC
  Administered 2014-10-19 – 2014-10-21 (×4): 3 mL via INTRAVENOUS

## 2014-10-19 MED ORDER — SODIUM CHLORIDE 0.9 % IV BOLUS (SEPSIS)
1000.0000 mL | Freq: Once | INTRAVENOUS | Status: AC
Start: 2014-10-19 — End: 2014-10-19
  Administered 2014-10-19: 1000 mL via INTRAVENOUS

## 2014-10-19 MED ORDER — ALBUTEROL SULFATE (2.5 MG/3ML) 0.083% IN NEBU: 2.5000 mg | INHALATION_SOLUTION | RESPIRATORY_TRACT | Status: DC | PRN

## 2014-10-19 MED ORDER — SODIUM CHLORIDE 0.9 % IV SOLN
INTRAVENOUS | Status: AC
  Administered 2014-10-19: 23:00:00 via INTRAVENOUS

## 2014-10-19 MED ORDER — ENSURE ENLIVE PO LIQD
237.0000 mL | Freq: Two times a day (BID) | ORAL | Status: DC
  Administered 2014-10-20 – 2014-10-22 (×3): 237 mL via ORAL

## 2014-10-19 MED ORDER — ACETAMINOPHEN 650 MG RE SUPP: 650.0000 mg | Freq: Four times a day (QID) | RECTAL | Status: DC | PRN

## 2014-10-19 MED ORDER — DOCUSATE SODIUM 100 MG PO CAPS
100.0000 mg | ORAL_CAPSULE | Freq: Two times a day (BID) | ORAL | Status: DC
  Administered 2014-10-20 – 2014-10-21 (×4): 100 mg via ORAL
  Filled 2014-10-19 (×5): qty 1

## 2014-10-19 MED ORDER — ACETAMINOPHEN 325 MG PO TABS: 650.0000 mg | ORAL_TABLET | Freq: Four times a day (QID) | ORAL | Status: DC | PRN

## 2014-10-19 MED ORDER — CITALOPRAM HYDROBROMIDE 20 MG PO TABS
20.0000 mg | ORAL_TABLET | Freq: Every day | ORAL | Status: DC
  Administered 2014-10-20 – 2014-10-22 (×3): 20 mg via ORAL
  Filled 2014-10-19 (×3): qty 1

## 2014-10-19 MED ORDER — HYDRALAZINE HCL 20 MG/ML IJ SOLN: 10.0000 mg | INTRAMUSCULAR | Status: DC | PRN

## 2014-10-19 MED ORDER — OLANZAPINE 5 MG PO TABS
5.0000 mg | ORAL_TABLET | Freq: Every day | ORAL | Status: DC
  Administered 2014-10-20 – 2014-10-22 (×4): 5 mg via ORAL
  Filled 2014-10-19 (×4): qty 1

## 2014-10-19 MED ORDER — PANTOPRAZOLE SODIUM 20 MG PO TBEC
20.0000 mg | DELAYED_RELEASE_TABLET | Freq: Every day | ORAL | Status: DC
  Administered 2014-10-20 – 2014-10-22 (×4): 20 mg via ORAL
  Filled 2014-10-19 (×4): qty 1

## 2014-10-19 MED ORDER — POLYETHYLENE GLYCOL 3350 17 G PO PACK: 17.0000 g | PACK | Freq: Every day | ORAL | Status: DC | PRN

## 2014-10-19 MED ORDER — HYDRALAZINE HCL 25 MG PO TABS
25.0000 mg | ORAL_TABLET | Freq: Three times a day (TID) | ORAL | Status: DC
  Administered 2014-10-20 – 2014-10-22 (×6): 25 mg via ORAL
  Filled 2014-10-19 (×7): qty 1

## 2014-10-19 MED ORDER — FLEET ENEMA 7-19 GM/118ML RE ENEM: 1.0000 | ENEMA | Freq: Once | RECTAL | Status: DC | PRN

## 2014-10-19 MED ORDER — ISOSORBIDE MONONITRATE ER 30 MG PO TB24: 30.0000 mg | ORAL_TABLET | Freq: Every day | ORAL | Status: DC

## 2014-10-19 NOTE — ED Provider Notes (Signed)
CSN: [id number]     Arrival date & time 10/19/14  1423 History   First MD Initiated Contact with Patient 10/19/14 1514     Chief Complaint  Patient presents with  . Emesis     (Consider location/radiation/quality/duration/timing/severity/associated sxs/prior Treatment) HPI  81 year old male who presents with dizziness, nausea, and vomiting. He has a history of hypertension, hyperlipidemia, and diabetes. Also, history of colon cancer s/p resection, cholecystectomy, appendectomy. Reports pain in his usual state of health, woke up this morning with sensation of dizziness. Reports that this does not feel like lightheadedness or like the room is spinning, but that he feels "woozy". This was followed by recurrent nausea and vomiting. He has been unable to ambulate, and has been in bed all day. Denies any fever, chills, abdominal pain, abdominal distention, nausea, vomiting, diarrhea. He had a normal bowel movement today, and continues to pass gas. Denies any associating headache, vision changes, slurring of speech, or word finding difficulties. Denies any numbness or weakness.  Past Medical History  Diagnosis Date  . Diabetes mellitus type II   . Hyperlipidemia   . Hypertension   . Osteoporosis   . Prostatic hypertrophy     benign  . OSA (obstructive sleep apnea)   . Rotator cuff syndrome     chronic  . Elevated PSA   . Hypothyroidism   . Nephrolithiasis   . Thyroid nodule   . GERD (gastroesophageal reflux disease)   . Colitis, Clostridium difficile     presumed 11/08  . Dementia   . Anemia     NOS  . Vitamin B 12 deficiency   . BRADYCARDIA 06/10/2009  . COLON CANCER, HX OF 09/20/2006  . HYPOTHYROIDISM 01/14/2007  . DIABETES MELLITUS, TYPE II 09/20/2006  . HYPERLIPIDEMIA 09/20/2006  . ANEMIA-NOS 06/23/2008  . DEMENTIA 05/14/2007  . DEPRESSION 02/27/2007  . SLEEP APNEA, OBSTRUCTIVE 01/14/2007  . HYPERTENSION 09/20/2006  . GERD 01/14/2007  . OSTEOPOROSIS 10/08/2006  . PERIPHERAL EDEMA  09/03/2007  . RENAL INSUFFICIENCY 04/01/2010  . B12 deficiency 05/25/2010  . CLOSTRIDIUM DIFFICILE COLITIS 01/30/2007  . THYROID NODULE 01/14/2007  . CONSTIPATION, RECURRENT 01/06/2010  . BENIGN PROSTATIC HYPERTROPHY 10/08/2006  . ARTHRITIS 02/23/2007  . BACK PAIN, THORACIC REGION 06/04/2008  . COLONIC POLYPS, HX OF 10/08/2006  . NEPHROLITHIASIS, HX OF 01/14/2007  . Cancer of colon dx'd 1998  . Cancer of vocal cord dx'd 1998  . 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 DVT of lower limb, acute 01/12/2011  . Sleep apnea     CPAP dependent  . Diastolic congestive heart failure   . Hypothyroidism   . Diabetes mellitus    Past Surgical History  Procedure Laterality Date  . Cholecystectomy  1999  . Inguinal herniorrhapy  1984  . Appendectomy  1998  . Colon resection  1998  . Skin cancer extraction      right ear-extensive scar  . Cataract extraction, bilateral    . Nm lexiscan myoview ltd  09/02/2013    Normal LV function and wall motion. EF 62%; rate dependent LBBB with Lexiscan, Low Risk fixed inferior defect suggestive of diaphragmatic attenuation and not infarct with normal wall motion.  . Transthoracic echocardiogram  08/03/2009    Normal LV size. Mild LVH. EF 60-65%. Gr 2 DD, mild MR.  . Esophagogastroduodenoscopy (egd) with propofol N/A 12/19/2013    Procedure: ESOPHAGOGASTRODUODENOSCOPY (EGD) WITH PROPOFOL;  Surgeon: Inda Castle, MD;  Location: [facility];  Service: Endoscopy;  Laterality: N/A;  . Savory dilation N/A  12/19/2013    Procedure: SAVORY DILATION;  Surgeon: Inda Castle, MD;  Location: [facility];  Service: Endoscopy;  Laterality: N/A;  With Fluoroscopy  . Esophagogastroduodenoscopy N/A 12/30/2013    Procedure: ESOPHAGOGASTRODUODENOSCOPY (EGD);  Surgeon: Inda Castle, MD;  Location: [facility];  Service: Endoscopy;  Laterality: N/A;  . Savory dilation N/A 12/30/2013    Procedure: SAVORY DILATION;  Surgeon: Inda Castle, MD;  Location: [facility];  Service:  Endoscopy;  Laterality: N/A;   Family History  Problem Relation Age of Onset  . Diabetes Mother   . Stroke Father    Social History  Substance Use Topics  . Smoking status: Never Smoker   . Smokeless tobacco: Never Used  . Alcohol Use: No    Review of Systems 10/14 systems reviewed and are negative other than those stated in the HPI   Allergies  Biaxin; Other; and Sulfa antibiotics  Home Medications   Prior to Admission medications   Medication Sig Start Date End Date Taking? Authorizing Provider  acetaminophen (TYLENOL) 325 MG tablet Take 325 mg by mouth every 6 (six) hours as needed for mild pain, moderate pain or headache.   Yes Historical Provider, MD  cholecalciferol (VITAMIN D) 1000 UNITS tablet Take 1,000 Units by mouth every morning.    Yes Historical Provider, MD  citalopram (CELEXA) 20 MG tablet Take 1 tablet (20 mg total) by mouth every morning. 08/20/14  Yes Robbie Lis, MD  famotidine (PEPCID) 20 MG tablet Take 1 tablet (20 mg total) by mouth daily. 08/20/14  Yes Robbie Lis, MD  furosemide (LASIX) 40 MG tablet Take 1 tablet (40 mg total) by mouth daily. 08/20/14  Yes Robbie Lis, MD  glucose blood test strip Use as directed once daily to check blood sugar.  Diagnosis code E11.9 08/07/14  Yes Biagio Borg, MD  hydrALAZINE (APRESOLINE) 25 MG tablet Take 1 tablet (25 mg total) by mouth 3 (three) times daily. 08/20/14  Yes Robbie Lis, MD  isosorbide mononitrate (IMDUR) 30 MG 24 hr tablet Take 1 tablet (30 mg total) by mouth every morning. 02/03/14  Yes Biagio Borg, MD  lansoprazole (PREVACID) 30 MG capsule Take 1 capsule (30 mg total) by mouth every morning. 02/03/14  Yes Biagio Borg, MD  levothyroxine (SYNTHROID, LEVOTHROID) 100 MCG tablet Take 1 tablet (100 mcg total) by mouth daily before breakfast. 02/03/14  Yes Biagio Borg, MD  lipase/protease/amylase (CREON) 12000 UNITS CPEP capsule Take 1 capsule (12,000 Units total) by mouth 3 (three) times daily with meals.  02/03/14  Yes Biagio Borg, MD  OLANZapine (ZYPREXA) 5 MG tablet Take 1 tablet (5 mg total) by mouth every morning. 08/20/14  Yes Robbie Lis, MD  potassium chloride (K-DUR) 10 MEQ tablet Take 1 tablet (10 mEq total) by mouth daily. 08/20/14  Yes Robbie Lis, MD  QUEtiapine (SEROQUEL) 50 MG tablet TAKE 1 TABLET (50 MG TOTAL) BY MOUTH AT BEDTIME. Patient taking differently: Take 50 mg by mouth at bedtime.  08/20/14  Yes Robbie Lis, MD  sitaGLIPtin (JANUVIA) 50 MG tablet Take 1 tablet (50 mg total) by mouth every morning. 02/03/14  Yes Biagio Borg, MD  tamsulosin (FLOMAX) 0.4 MG CAPS capsule Take 1 capsule (0.4 mg total) by mouth at bedtime. 02/03/14  Yes Biagio Borg, MD  ipratropium-albuterol (DUONEB) 0.5-2.5 (3) MG/3ML SOLN Take 3 mLs by nebulization every 6 (six) hours as needed. Patient not taking: Reported on 10/19/2014 08/20/14  Robbie Lis, MD   BP 215/63 mmHg  Pulse 54  Temp(Src) 98 F (36.7 C) (Oral)  Resp 16  Ht 6' (1.829 m)  Wt 165 lb (74.844 kg)  BMI 22.37 kg/m2  SpO2 98% Physical Exam Physical Exam  Nursing note and vitals reviewed. Constitutional: Well developed, well nourished, non-toxic, and in no acute distress Head: Normocephalic and atraumatic.  Mouth/Throat: Oropharynx is clear, but dry.  Neck: Normal range of motion. Neck supple.  Cardiovascular: Normal rate and regular rhythm.  +2 radial pulses symmetric. No edema. Pulmonary/Chest: Effort normal and breath sounds normal.  Abdominal: Soft. Mildly distended. There is no tenderness. There is no rebound and no guarding.  Musculoskeletal: Normal range of motion.  Skin: Skin is warm and dry.  Psychiatric: Cooperative Neurological:  Alert, oriented to person, place, time, and situation. Memory grossly in tact. Fluent speech. No dysarthria or aphasia.  Cranial nerves: VF are full. Pupils are symmetric, and reactive to light. EOMI without nystagmus. No gaze deviation. Facial muscles symmetric with activation.  Sensation to light touch over face in tact bilaterally. Hearing grossly in tact. Palate elevates symmetrically. Head turn and shoulder shrug are intact. Tongue midline.  Muscle bulk and tone normal. Slight pronator drift involving the left upper extremity. Moves all extremities symmetrically. Sensation to light touch is in tact throughout in bilateral upper and lower extremities. No dysmetria with finger to nose in the right upper extremity. With the left upper extremity, has difficulty with strength and grabs his left hand with his right arm to perform finger to nose. Unable to ambulate due to gait instability.   ED Course  Procedures (including critical care time) Labs Review Labs Reviewed  COMPREHENSIVE METABOLIC PANEL - Abnormal; Notable for the following:    Glucose, Bld 211 (*)    BUN 31 (*)    Creatinine, Ser 1.56 (*)    Calcium 8.5 (*)    ALT 12 (*)    GFR calc non Af Amer 37 (*)    GFR calc Af Amer 43 (*)    All other components within normal limits  CBC - Abnormal; Notable for the following:    RBC 3.95 (*)    Hemoglobin 12.5 (*)    HCT 37.7 (*)    All other components within normal limits  URINALYSIS, ROUTINE W REFLEX MICROSCOPIC (NOT AT [facility]) - Abnormal; Notable for the following:    Glucose, UA 100 (*)    Hgb urine dipstick TRACE (*)    Protein, ur 100 (*)    All other components within normal limits  BLOOD GAS, VENOUS - Abnormal; Notable for the following:    pH, Ven 7.384 (*)    pCO2, Ven 44.8 (*)    pO2, Ven 50.4 (*)    Bicarbonate 26.1 (*)    All other components within normal limits  TROPONIN I - Abnormal; Notable for the following:    Troponin I 0.04 (*)    All other components within normal limits  GLUCOSE, CAPILLARY - Abnormal; Notable for the following:    Glucose-Capillary 154 (*)    All other components within normal limits  LIPASE, BLOOD  URINE MICROSCOPIC-ADD ON  TROPONIN I  TROPONIN I  MAGNESIUM  PHOSPHORUS  TSH  COMPREHENSIVE METABOLIC  PANEL  CBC  HEMOGLOBIN A1C  CREATININE, URINE, RANDOM  SODIUM, URINE, RANDOM  I-STAT CG4 LACTIC ACID, ED  I-STAT TROPOININ, ED  I-STAT CG4 LACTIC ACID, ED    Imaging Review Ct Head Wo Contrast  10/19/2014  CLINICAL DATA:  None.  Vomiting today.  EXAM: CT HEAD WITHOUT CONTRAST  TECHNIQUE: Contiguous axial images were obtained from the base of the skull through the vertex without intravenous contrast.  COMPARISON:  MRI 01/08/2013  FINDINGS: Mild chronic small vessel disease throughout the deep white matter. Mild age related volume loss. No acute intracranial abnormality. Specifically, no hemorrhage, hydrocephalus, mass lesion, acute infarction, or significant intracranial injury. No acute calvarial abnormality. Mastoid air cells are clear. Mucosal thickening in the maxillary sinuses.  IMPRESSION: No acute intracranial abnormality.   Electronically Signed   By: Rolm Baptise M.D.   On: 10/19/2014 16:11   Mr Brain Wo Contrast  10/19/2014   CLINICAL DATA:  Nonstop Emesis since earlier today. Weakness and hypertension.  EXAM: MRI HEAD WITHOUT CONTRAST  TECHNIQUE: Multiplanar, multiecho pulse sequences of the brain and surrounding structures were obtained without intravenous contrast.  COMPARISON:  CT head earlier today.  MR brain 01/08/2013.  FINDINGS: No evidence for acute stroke, acute hemorrhage, mass lesion, or extra-axial fluid. Hydrocephalus ex vacuo. Moderate T2 and FLAIR hyperintensities throughout the periventricular and subcortical white matter representing chronic microvascular ischemic change. Partial empty sella. No tonsillar herniation. Upper cervical region unremarkable. Chronic sinusitis. Negative orbits. Trace LEFT mastoid effusion. No osseous lesions.  Compared with prior MR from 2014, similar appearance noted.  IMPRESSION: Chronic changes as described.  No acute intracranial findings.   Electronically Signed   By: Staci Righter M.D.   On: 10/19/2014 18:30   Dg Abd Acute  W/chest  10/19/2014   CLINICAL DATA:  Nausea, vomiting and abdominal distension with weakness today.  EXAM: DG ABDOMEN ACUTE W/ 1V CHEST  COMPARISON:  08/17/2014  FINDINGS: Lungs are somewhat hypoinflated without consolidation or effusion. Cardiomediastinal silhouette is within normal. There is mild calcified plaque over the aortic arch. There are mild degenerative changes of the spine.  Abdominal films demonstrate a relative paucity of bowel gas with mild fecal retention over the rectum. No evidence of free peritoneal air. Surgical suture line over the right lower quadrant likely due to prior bowel surgery. Remainder of the exam is unchanged.  IMPRESSION: Nonobstructive bowel gas pattern.  No acute cardiopulmonary disease.   Electronically Signed   By: Marin Olp M.D.   On: 10/19/2014 16:28   I, Forde Dandy, personally reviewed and evaluated these images and lab results as part of my medical decision-making.   EKG Interpretation   Date/Time:  Monday October 19 2014 15:38:14 EDT Ventricular Rate:  55 PR Interval:  187 QRS Duration: 107 QT Interval:  559 QTC Calculation: 535 R Axis:   -16 Text Interpretation:  Sinus rhythm Abnormal R-wave progression, early  transition LVH with secondary repolarization abnormality No significant  change since last tracing Confirmed by Winifred Balogh MD, Levetta Bognar ([id number]) on 10/19/2014  4:08:27 PM      MDM   Final diagnoses:  Hypertensive encephalopathy    In short, this is a 81 year old male who presents with dizziness, nausea, vomiting and gait instability. He on presentation is nontoxic and in no acute distress. He is noted to be hypertensive with systolic blood pressure of 190s to 200s. Remainder of vital signs are non-concerning. On exam, he is noted to have slight weakness of the left upper extremity, but was unable to do finger to nose as he uses his opposite hand to assist his left upper extremity with this. I'm unable to walk this patient as he  reports  significant dizziness and stability. My initial concern is for  possible posterior circulation CVA. A CT head is performed, and shows no acute intracranial processes. I did initially discuss this patient with the neurology hospitalist, Dr. Merlene Laughter. He recommended obtaining an MRI as if he does have CVA he would likely require transfer to [facility].  MRI was obtained, and does not show acute CVA. I suspect that in the setting of his elevated blood pressures that this may be reflective of his hypertension. His home oral medications are given to him.  His abdomen is soft, benign, nontender. He has a normal bowel movement here in the emergency department, and clinically does not appear obstructed. X-ray of his abdomen reveals no obstructive bowel gas pattern. Remainder of blood work is also unremarkable. In the setting of his diabetes,  and concern for possible anginal, ACS rule out was also obtained. His EKG is nonischemic, and troponin is negative.  no other infectious or metabolic etiologies are noted.  He is discussed with Dr. Daylene Posey from hospitalist service and admitted for ongoing management.   Forde Dandy, MD 10/20/14 [phone]

## 2014-10-19 NOTE — ED Notes (Signed)
Bed: OE69 Expected date:  Expected time:  Means of arrival:  Comments: EMS- 81yo M, weakness, n/v

## 2014-10-19 NOTE — H&P (Signed)
PCP: Nathan Cower, MD    Referring provider Liu   Chief Complaint:  Dizzy spells HPI: Nathan Blackwell is a 81 y.o. male   has a past medical history of Diabetes mellitus type II; Hyperlipidemia; Hypertension; Osteoporosis; Prostatic hypertrophy; OSA (obstructive sleep apnea); Rotator cuff syndrome; Elevated PSA; Hypothyroidism; Nephrolithiasis; Thyroid nodule; GERD (gastroesophageal reflux disease); Colitis, Clostridium difficile; Dementia; Anemia; Vitamin B 12 deficiency; BRADYCARDIA (06/10/2009); COLON CANCER, HX OF (09/20/2006); HYPOTHYROIDISM (01/14/2007); DIABETES MELLITUS, TYPE II (09/20/2006); HYPERLIPIDEMIA (09/20/2006); ANEMIA-NOS (06/23/2008); DEMENTIA (05/14/2007); DEPRESSION (02/27/2007); SLEEP APNEA, OBSTRUCTIVE (01/14/2007); HYPERTENSION (09/20/2006); GERD (01/14/2007); OSTEOPOROSIS (10/08/2006); PERIPHERAL EDEMA (09/03/2007); RENAL INSUFFICIENCY (04/01/2010); B12 deficiency (05/25/2010); CLOSTRIDIUM DIFFICILE COLITIS (01/30/2007); THYROID NODULE (01/14/2007); CONSTIPATION, RECURRENT (01/06/2010); BENIGN PROSTATIC HYPERTROPHY (10/08/2006); ARTHRITIS (02/23/2007); BACK PAIN, THORACIC REGION (06/04/2008); COLONIC POLYPS, HX OF (10/08/2006); NEPHROLITHIASIS, HX OF (01/14/2007); Cancer of colon (dx'd 1998); Cancer of vocal cord (dx'd 1998); NEOP, MALIGNANT, 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   Presented with  Episodes of nausea vomiting generalized weakness and dizzy spells. He reports persistent emesis since this morning. Patient is Nathan Blackwell of hearing family not at bedside.  On arrival to emergency department blood pressure was 221/94 patient states he is unable to take his home medicines due to significant nausea and vomiting. Although patient denied vertigo given persistent vague dizziness patient to be worse with position Case was discussed with neurology who recommended MRI.  MRI of his brain was  obtained which showed no evidence of acute CVA.  Patient persisted to be hypertensive. After administration of Zofran his nausea and vomiting has improved ER physician ordered his home medications including hydralazine and isosorbide. Repeat BP 178/74. denies any chest pain no shortness of breath. First troponin elevated at 0.04 Hospitalist was called for admission for hypertensive urgency   Review of Systems:    Pertinent positives include:  nausea, vomiting,  Constitutional:  No weight loss, night sweats, Fevers, chills, fatigue, weight loss  HEENT:  No headaches, Difficulty swallowing,Tooth/dental problems,Sore throat,  No sneezing, itching, ear ache, nasal congestion, post nasal drip,  Cardio-vascular:  No chest pain, Orthopnea, PND, anasarca, dizziness, palpitations.no Bilateral lower extremity swelling  GI:  No heartburn, indigestion, abdominal pain,  diarrhea, change in bowel habits, loss of appetite, melena, blood in stool, hematemesis Resp:  no shortness of breath at rest. No dyspnea on exertion, No excess mucus, no productive cough, No non-productive cough, No coughing up of blood.No change in color of mucus.No wheezing. Skin:  no rash or lesions. No jaundice GU:  no dysuria, change in color of urine, no urgency or frequency. No straining to urinate.  No flank pain.  Musculoskeletal:  No joint pain or no joint swelling. No decreased range of motion. No back pain.  Psych:  No change in mood or affect. No depression or anxiety. No memory loss.  Neuro: no localizing neurological complaints, no tingling, no weakness, no double vision, no gait abnormality, no slurred speech, no confusion  Otherwise ROS are negative except for above, 10 systems were reviewed  Past Medical History: Past Medical History  Diagnosis Date  . Diabetes mellitus type II   . Hyperlipidemia   . Hypertension   . Osteoporosis   . Prostatic hypertrophy     benign  . OSA (obstructive sleep apnea)   .  Rotator cuff syndrome     chronic  . Elevated PSA   . Hypothyroidism   . Nephrolithiasis   . Thyroid nodule   .  GERD (gastroesophageal reflux disease)   . Colitis, Clostridium difficile     presumed 11/08  . Dementia   . Anemia     NOS  . Vitamin B 12 deficiency   . BRADYCARDIA 06/10/2009  . COLON CANCER, HX OF 09/20/2006  . HYPOTHYROIDISM 01/14/2007  . DIABETES MELLITUS, TYPE II 09/20/2006  . HYPERLIPIDEMIA 09/20/2006  . ANEMIA-NOS 06/23/2008  . DEMENTIA 05/14/2007  . DEPRESSION 02/27/2007  . SLEEP APNEA, OBSTRUCTIVE 01/14/2007  . HYPERTENSION 09/20/2006  . GERD 01/14/2007  . OSTEOPOROSIS 10/08/2006  . PERIPHERAL EDEMA 09/03/2007  . RENAL INSUFFICIENCY 04/01/2010  . B12 deficiency 05/25/2010  . CLOSTRIDIUM DIFFICILE COLITIS 01/30/2007  . THYROID NODULE 01/14/2007  . CONSTIPATION, RECURRENT 01/06/2010  . BENIGN PROSTATIC HYPERTROPHY 10/08/2006  . ARTHRITIS 02/23/2007  . BACK PAIN, THORACIC REGION 06/04/2008  . COLONIC POLYPS, HX OF 10/08/2006  . NEPHROLITHIASIS, HX OF 01/14/2007  . Cancer of colon dx'd 1998  . Cancer of vocal cord dx'd 1998  . 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 DVT of lower limb, acute 01/12/2011  . Sleep apnea     CPAP dependent  . Diastolic congestive heart failure   . Hypothyroidism   . Diabetes mellitus    Past Surgical History  Procedure Laterality Date  . Cholecystectomy  1999  . Inguinal herniorrhapy  1984  . Appendectomy  1998  . Colon resection  1998  . Skin cancer extraction      right ear-extensive scar  . Cataract extraction, bilateral    . Nm lexiscan myoview ltd  09/02/2013    Normal LV function and wall motion. EF 62%; rate dependent LBBB with Lexiscan, Low Risk fixed inferior defect suggestive of diaphragmatic attenuation and not infarct with normal wall motion.  . Transthoracic echocardiogram  08/03/2009    Normal LV size. Mild LVH. EF 60-65%. Gr 2 DD, mild MR.  . Esophagogastroduodenoscopy (egd) with propofol N/A 12/19/2013    Procedure:  ESOPHAGOGASTRODUODENOSCOPY (EGD) WITH PROPOFOL;  Surgeon: Inda Castle, MD;  Location: [facility];  Service: Endoscopy;  Laterality: N/A;  . Savory dilation N/A 12/19/2013    Procedure: SAVORY DILATION;  Surgeon: Inda Castle, MD;  Location: [facility];  Service: Endoscopy;  Laterality: N/A;  With Fluoroscopy  . Esophagogastroduodenoscopy N/A 12/30/2013    Procedure: ESOPHAGOGASTRODUODENOSCOPY (EGD);  Surgeon: Inda Castle, MD;  Location: [facility];  Service: Endoscopy;  Laterality: N/A;  . Savory dilation N/A 12/30/2013    Procedure: SAVORY DILATION;  Surgeon: Inda Castle, MD;  Location: [facility];  Service: Endoscopy;  Laterality: N/A;     Medications: Prior to Admission medications   Medication Sig Start Date End Date Taking? Authorizing Provider  acetaminophen (TYLENOL) 325 MG tablet Take 325 mg by mouth every 6 (six) hours as needed for mild pain, moderate pain or headache.   Yes Historical Provider, MD  cholecalciferol (VITAMIN D) 1000 UNITS tablet Take 1,000 Units by mouth every morning.    Yes Historical Provider, MD  citalopram (CELEXA) 20 MG tablet Take 1 tablet (20 mg total) by mouth every morning. 08/20/14  Yes Robbie Lis, MD  famotidine (PEPCID) 20 MG tablet Take 1 tablet (20 mg total) by mouth daily. 08/20/14  Yes Robbie Lis, MD  furosemide (LASIX) 40 MG tablet Take 1 tablet (40 mg total) by mouth daily. 08/20/14  Yes Robbie Lis, MD  glucose blood test strip Use as directed once daily to check blood sugar.  Diagnosis code E11.9 08/07/14  Yes Biagio Borg,  MD  hydrALAZINE (APRESOLINE) 25 MG tablet Take 1 tablet (25 mg total) by mouth 3 (three) times daily. 08/20/14  Yes Robbie Lis, MD  isosorbide mononitrate (IMDUR) 30 MG 24 hr tablet Take 1 tablet (30 mg total) by mouth every morning. 02/03/14  Yes Biagio Borg, MD  lansoprazole (PREVACID) 30 MG capsule Take 1 capsule (30 mg total) by mouth every morning. 02/03/14  Yes Biagio Borg, MD  levothyroxine  (SYNTHROID, LEVOTHROID) 100 MCG tablet Take 1 tablet (100 mcg total) by mouth daily before breakfast. 02/03/14  Yes Biagio Borg, MD  lipase/protease/amylase (CREON) 12000 UNITS CPEP capsule Take 1 capsule (12,000 Units total) by mouth 3 (three) times daily with meals. 02/03/14  Yes Biagio Borg, MD  OLANZapine (ZYPREXA) 5 MG tablet Take 1 tablet (5 mg total) by mouth every morning. 08/20/14  Yes Robbie Lis, MD  potassium chloride (K-DUR) 10 MEQ tablet Take 1 tablet (10 mEq total) by mouth daily. 08/20/14  Yes Robbie Lis, MD  QUEtiapine (SEROQUEL) 50 MG tablet TAKE 1 TABLET (50 MG TOTAL) BY MOUTH AT BEDTIME. Patient taking differently: Take 50 mg by mouth at bedtime.  08/20/14  Yes Robbie Lis, MD  sitaGLIPtin (JANUVIA) 50 MG tablet Take 1 tablet (50 mg total) by mouth every morning. 02/03/14  Yes Biagio Borg, MD  tamsulosin (FLOMAX) 0.4 MG CAPS capsule Take 1 capsule (0.4 mg total) by mouth at bedtime. 02/03/14  Yes Biagio Borg, MD  ipratropium-albuterol (DUONEB) 0.5-2.5 (3) MG/3ML SOLN Take 3 mLs by nebulization every 6 (six) hours as needed. Patient not taking: Reported on 10/19/2014 08/20/14   Robbie Lis, MD    Allergies:   Allergies  Allergen Reactions  . Biaxin [Clarithromycin] Other (See Comments)    Unknown   . Other Other (See Comments)    Nephrologist has told patient not to eat many different foods (tomatoes, [employer], etc).   . Sulfa Antibiotics Other (See Comments)    Unknown     Social History:  Ambulatory  walker   Lives at home With family     reports that he has never smoked. He has never used smokeless tobacco. He reports that he does not drink alcohol or use illicit drugs.    Family History: family history includes Diabetes in his mother; Stroke in his father.    Physical Exam: Patient Vitals for the past 24 hrs:  BP Temp Temp src Pulse Resp SpO2 Height Weight  10/19/14 1855 194/61 mmHg - - (!) 52 16 96 % - -  10/19/14 1643 194/84 mmHg - - 60 14 97  % - -  10/19/14 1506 - - - - - - 6' (1.829 m) 74.844 kg (165 lb)  10/19/14 1449 (!) 223/75 mmHg 97.7 F (36.5 C) Oral (!) 56 18 96 % - -    1. General:  in No Acute distress hard of hearing  2. Psychological: Alert and   Oriented 3. Head/ENT:    Dry Mucous Membranes                          Head Non traumatic, neck supple                          Normal   Dentition 4. SKIN:  decreased Skin turgor,  Skin clean Dry and intact no rash 5. Heart: Regular rate and rhythm no Murmur, Rub  or gallop 6. Lungs: Clear to auscultation bilaterally, no wheezes or crackles   7. Abdomen: Soft, non-tender, Non distended 8. Lower extremities: no clubbing, cyanosis, trace edema 9. Neurologically cranial nerves II through XII intact appear to have slight nystagmus. Strength 5 out of 5 in all 4 extremities 10. MSK: Normal range of motion  body mass index is 22.37 kg/(m^2).   Labs on Admission:   Results for orders placed or performed during the hospital encounter of 10/19/14 (from the past 24 hour(s))  Lipase, blood     Status: None   Collection Time: 10/19/14  3:19 PM  Result Value Ref Range   Lipase 23 22 - 51 U/L  Comprehensive metabolic panel     Status: Abnormal   Collection Time: 10/19/14  3:19 PM  Result Value Ref Range   Sodium 142 135 - 145 mmol/L   Potassium 3.6 3.5 - 5.1 mmol/L   Chloride 105 101 - 111 mmol/L   CO2 25 22 - 32 mmol/L   Glucose, Bld 211 (H) 65 - 99 mg/dL   BUN 31 (H) 6 - 20 mg/dL   Creatinine, Ser 1.56 (H) 0.61 - 1.24 mg/dL   Calcium 8.5 (L) 8.9 - 10.3 mg/dL   Total Protein 7.1 6.5 - 8.1 g/dL   Albumin 3.8 3.5 - 5.0 g/dL   AST 20 15 - 41 U/L   ALT 12 (L) 17 - 63 U/L   Alkaline Phosphatase 73 38 - 126 U/L   Total Bilirubin 0.7 0.3 - 1.2 mg/dL   GFR calc non Af Amer 37 (L) >60 mL/min   GFR calc Af Amer 43 (L) >60 mL/min   Anion gap 12 5 - 15  CBC     Status: Abnormal   Collection Time: 10/19/14  3:19 PM  Result Value Ref Range   WBC 10.1 4.0 - 10.5 K/uL   RBC  3.95 (L) 4.22 - 5.81 MIL/uL   Hemoglobin 12.5 (L) 13.0 - 17.0 g/dL   HCT 37.7 (L) 39.0 - 52.0 %   MCV 95.4 78.0 - 100.0 fL   MCH 31.6 26.0 - 34.0 pg   MCHC 33.2 30.0 - 36.0 g/dL   RDW 13.4 11.5 - 15.5 %   Platelets 212 150 - 400 K/uL  I-Stat Troponin, ED (not at [facility])     Status: None   Collection Time: 10/19/14  3:33 PM  Result Value Ref Range   Troponin i, poc 0.02 0.00 - 0.08 ng/mL   Comment 3          I-Stat CG4 Lactic Acid, ED     Status: None   Collection Time: 10/19/14  3:36 PM  Result Value Ref Range   Lactic Acid, Venous 1.26 0.5 - 2.0 mmol/L  Urinalysis, Routine w reflex microscopic (not at [facility])     Status: Abnormal   Collection Time: 10/19/14  4:34 PM  Result Value Ref Range   Color, Urine YELLOW YELLOW   APPearance CLEAR CLEAR   Specific Gravity, Urine 1.009 1.005 - 1.030   pH 7.0 5.0 - 8.0   Glucose, UA 100 (A) NEGATIVE mg/dL   Hgb urine dipstick TRACE (A) NEGATIVE   Bilirubin Urine NEGATIVE NEGATIVE   Ketones, ur NEGATIVE NEGATIVE mg/dL   Protein, ur 100 (A) NEGATIVE mg/dL   Urobilinogen, UA 0.2 0.0 - 1.0 mg/dL   Nitrite NEGATIVE NEGATIVE   Leukocytes, UA NEGATIVE NEGATIVE  Urine microscopic-add on     Status: None   Collection Time: 10/19/14  4:34  PM  Result Value Ref Range   WBC, UA 0-2 <3 WBC/hpf   RBC / HPF 0-2 <3 RBC/hpf  Blood gas, venous     Status: Abnormal   Collection Time: 10/19/14  5:28 PM  Result Value Ref Range   FIO2 0.21    pH, Ven 7.384 (H) 7.250 - 7.300   pCO2, Ven 44.8 (L) 45.0 - 50.0 mmHg   pO2, Ven 50.4 (H) 30.0 - 45.0 mmHg   Bicarbonate 26.1 (H) 20.0 - 24.0 mEq/L   TCO2 23.3 0 - 100 mmol/L   Acid-Base Excess 1.2 0.0 - 2.0 mmol/L   O2 Saturation 83.0 %   Patient temperature 98.6    Collection site VEIN    Drawn by COLLECTED BY LABORATORY    Sample type VEIN   Troponin I (q 6hr x 3)     Status: Abnormal   Collection Time: 10/19/14  7:53 PM  Result Value Ref Range   Troponin I 0.04 (H) <0.031 ng/mL  I-Stat CG4 Lactic Acid, ED      Status: None   Collection Time: 10/19/14  7:59 PM  Result Value Ref Range   Lactic Acid, Venous 1.29 0.5 - 2.0 mmol/L    UA  no evidence of UTI  Lab Results  Component Value Date   HGBA1C 7.2* 08/17/2014    Estimated Creatinine Clearance: 32 mL/min (by C-G formula based on Cr of 1.56).  BNP (last 3 results) No results for input(s): PROBNP in the last 8760 hours.  Other results:  I have pearsonaly reviewed this: ECG REPORT  Rate: 55   Rhythm: Sinus rhythm with pacer spikes ST&T Change: no ischemia QTC 535  Filed Weights   10/19/14 1506  Weight: 74.844 kg (165 lb)     Cultures:    Component Value Date/Time   SDES URINE, RANDOM 08/18/2014 0315   SPECREQUEST NONE 08/18/2014 0315   CULT  08/18/2014 0149    NO GROWTH 5 DAYS Performed at [facility]    REPTSTATUS 08/19/2014 FINAL 08/18/2014 0315     Radiological Exams on Admission: Ct Head Wo Contrast  10/19/2014   CLINICAL DATA:  None.  Vomiting today.  EXAM: CT HEAD WITHOUT CONTRAST  TECHNIQUE: Contiguous axial images were obtained from the base of the skull through the vertex without intravenous contrast.  COMPARISON:  MRI 01/08/2013  FINDINGS: Mild chronic small vessel disease throughout the deep white matter. Mild age related volume loss. No acute intracranial abnormality. Specifically, no hemorrhage, hydrocephalus, mass lesion, acute infarction, or significant intracranial injury. No acute calvarial abnormality. Mastoid air cells are clear. Mucosal thickening in the maxillary sinuses.  IMPRESSION: No acute intracranial abnormality.   Electronically Signed   By: Rolm Baptise M.D.   On: 10/19/2014 16:11   Mr Brain Wo Contrast  10/19/2014   CLINICAL DATA:  Nonstop Emesis since earlier today. Weakness and hypertension.  EXAM: MRI HEAD WITHOUT CONTRAST  TECHNIQUE: Multiplanar, multiecho pulse sequences of the brain and surrounding structures were obtained without intravenous contrast.  COMPARISON:  CT head  earlier today.  MR brain 01/08/2013.  FINDINGS: No evidence for acute stroke, acute hemorrhage, mass lesion, or extra-axial fluid. Hydrocephalus ex vacuo. Moderate T2 and FLAIR hyperintensities throughout the periventricular and subcortical white matter representing chronic microvascular ischemic change. Partial empty sella. No tonsillar herniation. Upper cervical region unremarkable. Chronic sinusitis. Negative orbits. Trace LEFT mastoid effusion. No osseous lesions.  Compared with prior MR from 2014, similar appearance noted.  IMPRESSION: Chronic changes as described.  No acute intracranial findings.   Electronically Signed   By: Staci Righter M.D.   On: 10/19/2014 18:30   Dg Abd Acute W/chest  10/19/2014   CLINICAL DATA:  Nausea, vomiting and abdominal distension with weakness today.  EXAM: DG ABDOMEN ACUTE W/ 1V CHEST  COMPARISON:  08/17/2014  FINDINGS: Lungs are somewhat hypoinflated without consolidation or effusion. Cardiomediastinal silhouette is within normal. There is mild calcified plaque over the aortic arch. There are mild degenerative changes of the spine.  Abdominal films demonstrate a relative paucity of bowel gas with mild fecal retention over the rectum. No evidence of free peritoneal air. Surgical suture line over the right lower quadrant likely due to prior bowel surgery. Remainder of the exam is unchanged.  IMPRESSION: Nonobstructive bowel gas pattern.  No acute cardiopulmonary disease.   Electronically Signed   By: Marin Olp M.D.   On: 10/19/2014 16:28    Chart has been reviewed  Family not at  Bedside    Assessment/Plan  81 year old gentleman with history of mild dementia presents with persistent nausea vomiting and dizziness MRI of the head negative for CVA. Patient noted to have elevated blood pressure in the setting of not taking his home medications due to nausea. With elevated troponin up to 0.04 no chest pain no EKG changes noted Present on Admission:  . Hypertensive  urgency - significant hypertension in the setting of dizziness with associated nausea and vomiting.  We'll restart home medications. If needed may require when necessary hydralazine IV continue to cycle cardiac enzymes and repeat serial EKG order echo gram in the morning.  . Dementia - mild global look for sundowning    . Stage 3 chronic renal impairment associated with type 2 diabetes mellitus creatinine appears to be at baseline even somewhat improved from 2 months ago at base of his creatinine from 1.7  . Nausea and vomiting - persistent KUB showing no evidence of obstruction. Nausea and vomiting seems to worsen the patient's movement MRI show no evidence of CVA. Minimal nystagmus noted patient unsure if he is having true vertigo. Will trial low-dose meclizine to see if it helps  . Diastolic congestive heart failure hold Lasix for now given persistent nausea and vomiting currently does not appear to be fluid overloaded  elevated QTC at 538 - the monitor on telemetry stops Zofran await QTC prolongating medications hold Seroquel for today  elevated troponin - minimally elevated troponin in the setting of significant hypertension. No chest pain or shortness of breath no EKG changes. We'll continue to cycle obtain echogram to tolerate for any wall motion abnormality slightly elevated troponin secondary to demand in the setting of significant hypertension   diabetes mellitus order sliding scale hold Januvia  Prophylaxis: Lovenox   CODE STATUS:    DNR/DNI as per patient    Disposition:  To home once workup is complete and patient is stable  Other plan as per orders.  I have spent a total of 55 min on this admission  Cannon Quinton 10/19/2014, 8:40 PM  [facility]  Pager [phone]   after 2 AM please page floor coverage PA If 7AM-7PM, please contact the day team taking care of the patient  [email or web address]  Password [email or web address]

## 2014-10-19 NOTE — ED Notes (Signed)
Pt did NOT have a BM as he had stated.  Nothing present when diaper removed.

## 2014-10-19 NOTE — ED Notes (Signed)
Phlebotomy made aware of Istat Lactic order

## 2014-10-19 NOTE — ED Notes (Signed)
Unable to collect labs at  This time patient is in xray.

## 2014-10-19 NOTE — ED Notes (Signed)
Pt c/o non stop emesis beginning when he woke today.  He is now dry heaving.  Pt is very HOH so will need to speak closely into ears.  BP 221/94 60P 95O2RA CBG 184.  Pt unable to take PO meds today PTA.

## 2014-10-19 NOTE — ED Notes (Signed)
Pt received 4mg  Zofran IV PTA with no improvement.

## 2014-10-20 ENCOUNTER — Inpatient Hospital Stay (HOSPITAL_COMMUNITY): Payer: Medicare Other

## 2014-10-20 DIAGNOSIS — R55 Syncope and collapse: Secondary | ICD-10-CM

## 2014-10-20 DIAGNOSIS — R42 Dizziness and giddiness: Secondary | ICD-10-CM

## 2014-10-20 DIAGNOSIS — E440 Moderate protein-calorie malnutrition: Secondary | ICD-10-CM | POA: Insufficient documentation

## 2014-10-20 LAB — GLUCOSE, CAPILLARY
GLUCOSE-CAPILLARY: 153 mg/dL — AB (ref 65–99)
GLUCOSE-CAPILLARY: 117 mg/dL — AB (ref 65–99)
Glucose-Capillary: 130 mg/dL — ABNORMAL HIGH (ref 65–99)
Glucose-Capillary: 188 mg/dL — ABNORMAL HIGH (ref 65–99)

## 2014-10-20 LAB — COMPREHENSIVE METABOLIC PANEL
ALBUMIN: 3.8 g/dL (ref 3.5–5.0)
ALK PHOS: 76 U/L (ref 38–126)
ALT: 14 U/L — AB (ref 17–63)
AST: 23 U/L (ref 15–41)
Anion gap: 13 (ref 5–15)
BUN: 28 mg/dL — AB (ref 6–20)
CALCIUM: 8.7 mg/dL — AB (ref 8.9–10.3)
CHLORIDE: 105 mmol/L (ref 101–111)
CO2: 26 mmol/L (ref 22–32)
CREATININE: 1.59 mg/dL — AB (ref 0.61–1.24)
GFR calc non Af Amer: 36 mL/min — ABNORMAL LOW (ref >60)
GFR, EST AFRICAN AMERICAN: 42 mL/min — AB (ref >60)
GLUCOSE: 197 mg/dL — AB (ref 65–99)
Potassium: 3.2 mmol/L — ABNORMAL LOW (ref 3.5–5.1)
SODIUM: 144 mmol/L (ref 135–145)
Total Bilirubin: 0.9 mg/dL (ref 0.3–1.2)
Total Protein: 7.4 g/dL (ref 6.5–8.1)

## 2014-10-20 LAB — CBC
HCT: 37.9 % — ABNORMAL LOW (ref 39.0–52.0)
Hemoglobin: 12.7 g/dL — ABNORMAL LOW (ref 13.0–17.0)
MCH: 31.8 pg (ref 26.0–34.0)
MCHC: 33.5 g/dL (ref 30.0–36.0)
MCV: 94.8 fL (ref 78.0–100.0)
PLATELETS: 223 10*3/uL (ref 150–400)
RBC: 4 MIL/uL — AB (ref 4.22–5.81)
RDW: 13.6 % (ref 11.5–15.5)
WBC: 9.5 10*3/uL (ref 4.0–10.5)

## 2014-10-20 LAB — TROPONIN I
Troponin I: 0.04 ng/mL — ABNORMAL HIGH (ref <0.031)
Troponin I: 0.06 ng/mL — ABNORMAL HIGH (ref <0.031)

## 2014-10-20 LAB — SODIUM, URINE, RANDOM: SODIUM UR: 23 mmol/L

## 2014-10-20 LAB — PHOSPHORUS: PHOSPHORUS: 3.2 mg/dL (ref 2.5–4.6)

## 2014-10-20 LAB — MAGNESIUM: Magnesium: 1.8 mg/dL (ref 1.7–2.4)

## 2014-10-20 LAB — CREATININE, URINE, RANDOM: Creatinine, Urine: 109.61 mg/dL

## 2014-10-20 LAB — TSH: TSH: 3.134 u[IU]/mL (ref 0.350–4.500)

## 2014-10-20 MED ORDER — MECLIZINE HCL 25 MG PO TABS
12.5000 mg | ORAL_TABLET | Freq: Two times a day (BID) | ORAL | Status: DC
  Administered 2014-10-20 – 2014-10-22 (×5): 12.5 mg via ORAL
  Filled 2014-10-20 (×5): qty 1

## 2014-10-20 MED ORDER — PROMETHAZINE HCL 25 MG/ML IJ SOLN
12.5000 mg | Freq: Once | INTRAMUSCULAR | Status: AC
  Administered 2014-10-20: 12.5 mg via INTRAVENOUS
  Filled 2014-10-20: qty 1

## 2014-10-20 MED ORDER — METOCLOPRAMIDE HCL 5 MG/ML IJ SOLN: 10.0000 mg | Freq: Four times a day (QID) | INTRAMUSCULAR | Status: DC | PRN

## 2014-10-20 MED ORDER — BOOST / RESOURCE BREEZE PO LIQD
1.0000 | Freq: Two times a day (BID) | ORAL | Status: DC
  Administered 2014-10-21 – 2014-10-22 (×3): 1 via ORAL

## 2014-10-20 MED ORDER — HYDRALAZINE HCL 20 MG/ML IJ SOLN
2.0000 mg | Freq: Once | INTRAMUSCULAR | Status: AC
  Administered 2014-10-20: 2 mg via INTRAVENOUS
  Filled 2014-10-20: qty 1

## 2014-10-20 NOTE — Progress Notes (Signed)
ED CM notified Edwina of [facility] of pt admission to be followed for d/c needs

## 2014-10-20 NOTE — Progress Notes (Signed)
Initial Nutrition Assessment  DOCUMENTATION CODES:   Non-severe (moderate) malnutrition in context of acute illness/injury  INTERVENTION:  - Continue Ensure Enlive BID, each supplement provides 350 kcal and 20 grams of protein - Will order Boost Breeze BID as pt may tolerate clear liquid supplement better at this time, each supplement provides 250 kcal and 9 grams of protein - RD will continue to monitor for needs  NUTRITION DIAGNOSIS:   Inadequate oral intake related to acute illness, nausea, vomiting as evidenced by per patient/family report.  GOAL:   Patient will meet greater than or equal to 90% of their needs  MONITOR:   PO intake, Weight trends, Labs, I & O's  REASON FOR ASSESSMENT:   Malnutrition Screening Tool  ASSESSMENT:  81 year old with: Episodes of nausea vomiting generalized weakness and dizzy spells. He reports persistent emesis since this morning. Patient is Nathan Blackwell of hearing family not at bedside. On arrival to emergency department blood pressure was 221/94 patient states he is unable to take his home medicines due to significant nausea and vomiting. Although patient denied vertigo given persistent vague dizziness patient to be worse with position   Pt seen for MST. BMI indicates normal weight status. Pt is very HOH and wife provides all information. PTA, pt's appetite had been decreased for a few weeks. This AM he had jello and apple juice for breakfast which he tolerated without emesis. She is unsure of last episode of emesis but states she was told he did have a few episodes over night last night. Vomiting and nausea began the day of admission.   Wife states that pt has lost a few pounds recently; she states 5 lbs over the past 3 months. Weight hx review shows 4 lb weight loss (2% body weight) in the past 4 months which is not significant for time frame. Some mild muscle and fat wasting noted throughout.   Helped pt order lunch: orange juice, strawberry ice  cream, and chocolate pudding. Although he is on Carb Modified he stated he only wanted FLD-type items for lunch.  Not currently meeting needs. Medications reviewed. Labs reviewed; CBGs: 130 and 154 mg/dL, K: 3.2 mmol/L, BUN/creatinine elevated, Ca: 8.7 mg/dL, GFR: 36.   Diet Order:  Diet Carb Modified Fluid consistency:: Thin; Room service appropriate?: Yes  Skin:  Reviewed, no issues  Last BM:  8/15  Height:   Ht Readings from Last 1 Encounters:  10/19/14 6' (1.829 m)    Weight:   Wt Readings from Last 1 Encounters:  10/19/14 165 lb (74.844 kg)    Ideal Body Weight:  80.91 kg (kg)  BMI:  Body mass index is 22.37 kg/(m^2).  Estimated Nutritional Needs:   Kcal:  9937-1696  Protein:  70-80 grams  Fluid:  2.2-2.5 L/day  EDUCATION NEEDS:   No education needs identified at this time     Jarome Matin, RD, LDN Inpatient Clinical Dietitian Pager # [phone] After hours/weekend pager # [phone]

## 2014-10-20 NOTE — Progress Notes (Signed)
[facility] PROGRESS NOTE  MARTEZ WEIAND OVA:[id number] DOB: [birth year] DOA: 10/19/2014  PCP: [name], MD  Brief HPI: 81 year old Caucasian male with a past medical history of diabetes, hypertension, osteoporosis, presented with dizziness, which was sudden onset, followed by nausea, vomiting. He was hospitalized for further evaluation.  Past medical history:  Past Medical History  Diagnosis Date  . Diabetes mellitus type II   . Hyperlipidemia   . Hypertension   . Osteoporosis   . Prostatic hypertrophy     benign  . OSA (obstructive sleep apnea)   . Rotator cuff syndrome     chronic  . Elevated PSA   . Hypothyroidism   . Nephrolithiasis   . Thyroid nodule   . GERD (gastroesophageal reflux disease)   . Colitis, Clostridium difficile     presumed 11/08  . Dementia   . Anemia     NOS  . Vitamin B 12 deficiency   . BRADYCARDIA 06/10/2009  . COLON CANCER, HX OF 09/20/2006  . HYPOTHYROIDISM 01/14/2007  . DIABETES MELLITUS, TYPE II 09/20/2006  . HYPERLIPIDEMIA 09/20/2006  . ANEMIA-NOS 06/23/2008  . DEMENTIA 05/14/2007  . DEPRESSION 02/27/2007  . SLEEP APNEA, OBSTRUCTIVE 01/14/2007  . HYPERTENSION 09/20/2006  . GERD 01/14/2007  . OSTEOPOROSIS 10/08/2006  . PERIPHERAL EDEMA 09/03/2007  . RENAL INSUFFICIENCY 04/01/2010  . B12 deficiency 05/25/2010  . CLOSTRIDIUM DIFFICILE COLITIS 01/30/2007  . THYROID NODULE 01/14/2007  . CONSTIPATION, RECURRENT 01/06/2010  . BENIGN PROSTATIC HYPERTROPHY 10/08/2006  . ARTHRITIS 02/23/2007  . BACK PAIN, THORACIC REGION 06/04/2008  . COLONIC POLYPS, HX OF 10/08/2006  . NEPHROLITHIASIS, HX OF 01/14/2007  . Cancer of colon dx'd 1998  . Cancer of vocal cord dx'd 1998  . 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 DVT of lower limb, acute 01/12/2011  . Sleep apnea     CPAP dependent  . Diastolic congestive heart failure   . Hypothyroidism   . Diabetes mellitus     Consultants: None  Procedures:  Carotid Doppler is pending  2-D echocardiogram is  pending  Antibiotics: None  Subjective: Patient feels slightly better this morning. He says that he is not as nauseated. Still feels dizzy when he makes any sudden movements of his head. Denies sensation of the room spinning around him. Denies any headache. This was sudden onset. Denies any abdominal pain.  Objective: Vital Signs  Filed Vitals:   10/20/14 0118 10/20/14 0209 10/20/14 0215 10/20/14 0548  BP: 181/62 176/60 181/62 135/71  Pulse: 71  70 73  Temp:   98.3 F (36.8 C) 97.8 F (36.6 C)  TempSrc:   Oral Oral  Resp:   16 16  Height:      Weight:      SpO2:   95% 96%    Intake/Output Summary (Last 24 hours) at 10/20/14 1004 Last data filed at 10/20/14 0900  Gross per 24 hour  Intake 656.67 ml  Output    730 ml  Net -73.33 ml   Filed Weights   10/19/14 1506  Weight: 74.844 kg (165 lb)    General appearance: alert, cooperative, appears stated age and no distress Resp: clear to auscultation bilaterally Cardio: regular rate and rhythm, S1, S2 normal, no murmur, click, rub or gallop and Right carotid bruit GI: soft, non-tender; bowel sounds normal; no masses,  no organomegaly Extremities: extremities normal, atraumatic, no cyanosis or edema Neurologic: Alert. Oriented 3. No facial asymmetry. Motor strength equal bilateral upper and lower extremities.  Lab Results:  Basic Metabolic  Panel:  Recent Labs Lab 10/19/14 1519 10/20/14 0215  NA 142 144  K 3.6 3.2*  CL 105 105  CO2 25 26  GLUCOSE 211* 197*  BUN 31* 28*  CREATININE 1.56* 1.59*  CALCIUM 8.5* 8.7*  MG  --  1.8  PHOS  --  3.2   Liver Function Tests:  Recent Labs Lab 10/19/14 1519 10/20/14 0215  AST 20 23  ALT 12* 14*  ALKPHOS 73 76  BILITOT 0.7 0.9  PROT 7.1 7.4  ALBUMIN 3.8 3.8    Recent Labs Lab 10/19/14 1519  LIPASE 23   CBC:  Recent Labs Lab 10/19/14 1519 10/20/14 0215  WBC 10.1 9.5  HGB 12.5* 12.7*  HCT 37.7* 37.9*  MCV 95.4 94.8  PLT 212 223   Cardiac  Enzymes:  Recent Labs Lab 10/19/14 1953 10/20/14 0215 10/20/14 0745  TROPONINI 0.04* 0.04* 0.06*   BNP (last 3 results)  Recent Labs  08/18/14 0345  BNP 266.2*    CBG:  Recent Labs Lab 10/19/14 2245 10/20/14 0724  GLUCAP 154* 130*      Studies/Results: Ct Head Wo Contrast  10/19/2014   CLINICAL DATA:  None.  Vomiting today.  EXAM: CT HEAD WITHOUT CONTRAST  TECHNIQUE: Contiguous axial images were obtained from the base of the skull through the vertex without intravenous contrast.  COMPARISON:  MRI 01/08/2013  FINDINGS: Mild chronic small vessel disease throughout the deep white matter. Mild age related volume loss. No acute intracranial abnormality. Specifically, no hemorrhage, hydrocephalus, mass lesion, acute infarction, or significant intracranial injury. No acute calvarial abnormality. Mastoid air cells are clear. Mucosal thickening in the maxillary sinuses.  IMPRESSION: No acute intracranial abnormality.   Electronically Signed   By: Rolm Baptise M.D.   On: 10/19/2014 16:11   Mr Brain Wo Contrast  10/19/2014   CLINICAL DATA:  Nonstop Emesis since earlier today. Weakness and hypertension.  EXAM: MRI HEAD WITHOUT CONTRAST  TECHNIQUE: Multiplanar, multiecho pulse sequences of the brain and surrounding structures were obtained without intravenous contrast.  COMPARISON:  CT head earlier today.  MR brain 01/08/2013.  FINDINGS: No evidence for acute stroke, acute hemorrhage, mass lesion, or extra-axial fluid. Hydrocephalus ex vacuo. Moderate T2 and FLAIR hyperintensities throughout the periventricular and subcortical white matter representing chronic microvascular ischemic change. Partial empty sella. No tonsillar herniation. Upper cervical region unremarkable. Chronic sinusitis. Negative orbits. Trace LEFT mastoid effusion. No osseous lesions.  Compared with prior MR from 2014, similar appearance noted.  IMPRESSION: Chronic changes as described.  No acute intracranial findings.    Electronically Signed   By: Staci Righter M.D.   On: 10/19/2014 18:30   Dg Abd Acute W/chest  10/19/2014   CLINICAL DATA:  Nausea, vomiting and abdominal distension with weakness today.  EXAM: DG ABDOMEN ACUTE W/ 1V CHEST  COMPARISON:  08/17/2014  FINDINGS: Lungs are somewhat hypoinflated without consolidation or effusion. Cardiomediastinal silhouette is within normal. There is mild calcified plaque over the aortic arch. There are mild degenerative changes of the spine.  Abdominal films demonstrate a relative paucity of bowel gas with mild fecal retention over the rectum. No evidence of free peritoneal air. Surgical suture line over the right lower quadrant likely due to prior bowel surgery. Remainder of the exam is unchanged.  IMPRESSION: Nonobstructive bowel gas pattern.  No acute cardiopulmonary disease.   Electronically Signed   By: Marin Olp M.D.   On: 10/19/2014 16:28    Medications:  Scheduled: . aspirin EC  81 mg Oral  Daily  . citalopram  20 mg Oral Daily  . docusate sodium  100 mg Oral BID  . enoxaparin (LOVENOX) injection  40 mg Subcutaneous QHS  . famotidine  20 mg Oral Daily  . feeding supplement  1 Container Oral BID BM  . feeding supplement (ENSURE ENLIVE)  237 mL Oral BID BM  . hydrALAZINE  25 mg Oral TID  . insulin aspart  0-5 Units Subcutaneous QHS  . insulin aspart  0-9 Units Subcutaneous TID WC  . isosorbide mononitrate  30 mg Oral Daily  . levothyroxine  100 mcg Oral QAC breakfast  . lipase/protease/amylase  12,000 Units Oral TID WC  . metoCLOPramide (REGLAN) injection  10 mg Intravenous 4 times per day  . OLANZapine  5 mg Oral Daily  . pantoprazole  20 mg Oral Daily  . senna  1 tablet Oral BID  . sodium chloride  3 mL Intravenous Q12H  . tamsulosin  0.4 mg Oral QHS   Continuous:  SAY:[id number] **OR** acetaminophen, albuterol, bisacodyl, hydrALAZINE, HYDROcodone-acetaminophen, meclizine, polyethylene glycol, sodium phosphate  Assessment/Plan:  Active  Problems:   Diabetes   Dementia   OSA (obstructive sleep apnea)   Essential hypertension   Stage 3 chronic renal impairment associated with type 2 diabetes mellitus   Nausea and vomiting   Diastolic congestive heart failure   Hypertensive urgency   Elevated troponin    Sudden onset dizziness Etiology unclear. Symptoms were not typical for vertigo, although he did have nausea, vomiting. Concern was about an acute stroke. However MRI head was negative. He'll need to rule out posterior circulation compromise. We'll get a MRA head as well. I also hear a right carotid bruit. Proceed with carotid Doppler. Echocardiogram is pending. He feels better this morning. Continue low-dose meclizine. PT and OT to evaluate.  Accelerated hypertension Blood pressure was very high when he presented to the hospital. This could have been the reason for his presentation as well. The reason for elevated blood pressure is not clear. Could be the other way around, as well. Blood pressures are improved this morning. Continue to monitor closely. Continue with his home medication regimen.  Nausea and vomiting Likely secondary to his dizziness. Abdomen is benign. Continue to monitor.  Minimally elevated troponin Likely secondary to high blood pressure. Await echocardiogram. Patient denies any chest pain. EKG does not show any ischemic changes. Anticipate no further workup if echocardiogram is low risk.  Chronic hearing impairment on hearing aids He tells me that the one of his hearing aid is malfunctioning. He has an appointment with his ENT doctor in September. But denies any tinnitus or earaches or ear discharge.  History of stage III chronic kidney disease Creatinine stable. Continue to monitor. Replace potassium.  History of diabetes mellitus type 2 Monitor CBGs. Continue sliding scale coverage.  History of chronic diastolic congestive heart failure Stable. Well compensated. Holding his Lasix for now.  QT  prolongation on EKG Continue to monitor. Replace electrolytes. Magnesium is normal.  DVT Prophylaxis: Lovenox    Code Status: DO NOT RESUSCITATE  Family Communication: Discussed with the patient and his wife  Disposition Plan: PT and OT to evaluate. Other workup as outlined above.    LOS: 1 day   [facility] Pager [phone] 10/20/2014, 10:04 AM  If 7PM-7AM, please contact night-coverage at [email or web address], password [facility]

## 2014-10-20 NOTE — Progress Notes (Signed)
*  PRELIMINARY RESULTS* Vascular Ultrasound Carotid Duplex (Doppler) has been completed.  Preliminary findings:  Right = 60-79% ICA stenosis, based on systolic velocity and ratio. 1-39% ICA stenosis, based on diastolic velocity. Left = 40-59% ICA stenosis, based on systolic velocity. 1-39% ICA stenosis, based on diastolic velocity and ratio.    Landry Mellow, RDMS, RVT  10/20/2014, 10:44 AM

## 2014-10-20 NOTE — Progress Notes (Signed)
  Echocardiogram 2D Echocardiogram has been performed.  Nathan Blackwell 10/20/2014, 9:27 AM

## 2014-10-21 DIAGNOSIS — I5032 Chronic diastolic (congestive) heart failure: Secondary | ICD-10-CM

## 2014-10-21 DIAGNOSIS — R42 Dizziness and giddiness: Secondary | ICD-10-CM

## 2014-10-21 DIAGNOSIS — R7989 Other specified abnormal findings of blood chemistry: Secondary | ICD-10-CM

## 2014-10-21 DIAGNOSIS — E440 Moderate protein-calorie malnutrition: Secondary | ICD-10-CM

## 2014-10-21 LAB — TROPONIN I: TROPONIN I: 0.05 ng/mL — AB (ref <0.031)

## 2014-10-21 LAB — CBC
HCT: 32.3 % — ABNORMAL LOW (ref 39.0–52.0)
Hemoglobin: 10.2 g/dL — ABNORMAL LOW (ref 13.0–17.0)
MCH: 30.4 pg (ref 26.0–34.0)
MCHC: 31.6 g/dL (ref 30.0–36.0)
MCV: 96.1 fL (ref 78.0–100.0)
PLATELETS: 194 10*3/uL (ref 150–400)
RBC: 3.36 MIL/uL — AB (ref 4.22–5.81)
RDW: 14.1 % (ref 11.5–15.5)
WBC: 8.6 10*3/uL (ref 4.0–10.5)

## 2014-10-21 LAB — BASIC METABOLIC PANEL
ANION GAP: 8 (ref 5–15)
BUN: 36 mg/dL — ABNORMAL HIGH (ref 6–20)
CO2: 29 mmol/L (ref 22–32)
Calcium: 8.4 mg/dL — ABNORMAL LOW (ref 8.9–10.3)
Chloride: 106 mmol/L (ref 101–111)
Creatinine, Ser: 2.04 mg/dL — ABNORMAL HIGH (ref 0.61–1.24)
GFR calc Af Amer: 31 mL/min — ABNORMAL LOW (ref >60)
GFR, EST NON AFRICAN AMERICAN: 27 mL/min — AB (ref >60)
GLUCOSE: 102 mg/dL — AB (ref 65–99)
POTASSIUM: 3.1 mmol/L — AB (ref 3.5–5.1)
Sodium: 143 mmol/L (ref 135–145)

## 2014-10-21 LAB — GLUCOSE, CAPILLARY
GLUCOSE-CAPILLARY: 140 mg/dL — AB (ref 65–99)
Glucose-Capillary: 98 mg/dL (ref 65–99)
Glucose-Capillary: 167 mg/dL — ABNORMAL HIGH (ref 65–99)
Glucose-Capillary: 176 mg/dL — ABNORMAL HIGH (ref 65–99)

## 2014-10-21 LAB — HEMOGLOBIN A1C
HEMOGLOBIN A1C: 6.9 % — AB (ref 4.8–5.6)
Mean Plasma Glucose: 151 mg/dL

## 2014-10-21 MED ORDER — ENOXAPARIN SODIUM 30 MG/0.3ML ~~LOC~~ SOLN
30.0000 mg | Freq: Every day | SUBCUTANEOUS | Status: DC
  Administered 2014-10-21: 30 mg via SUBCUTANEOUS
  Filled 2014-10-21: qty 0.3

## 2014-10-21 MED ORDER — BISACODYL 10 MG RE SUPP: 10.0000 mg | Freq: Every day | RECTAL | Status: AC | PRN

## 2014-10-21 MED ORDER — ASPIRIN 81 MG PO TBEC: 81.0000 mg | DELAYED_RELEASE_TABLET | Freq: Every day | ORAL | Status: AC

## 2014-10-21 MED ORDER — ENSURE ENLIVE PO LIQD: 237.0000 mL | Freq: Two times a day (BID) | ORAL | Status: DC

## 2014-10-21 NOTE — Evaluation (Signed)
Physical Therapy Evaluation Patient Details Name: Nathan Blackwell MRN: [id number] DOB: [birth year] Today's Date: 10/21/2014   History of Present Illness  81 year old Caucasian male with a past medical history of diabetes, hypertension, osteoporosis, presented with hypertenaion and dizziness, which was sudden onset, followed by nausea, vomiting. Positive orthostatics 10/20/14, MRI brain negative for acute infarct.   Clinical Impression  Pt admitted with above diagnosis. Pt currently with functional limitations due to the deficits listed below (see PT Problem List). Pt ambulated 200' with RW and supervision for safety/posture. He likely will be able to return home where he was independent with mobility using a walker.  Pt will benefit from skilled PT to increase their independence and safety with mobility to allow discharge to the venue listed below.       Follow Up Recommendations No PT follow up    Equipment Recommendations  None recommended by PT    Recommendations for Other Services       Precautions / Restrictions Precautions Precautions: Fall Precaution Comments: pt denies falls in past year Restrictions Weight Bearing Restrictions: No      Mobility  Bed Mobility Overal bed mobility: Modified Independent             General bed mobility comments: with rail  Transfers Overall transfer level: Needs assistance Equipment used: Rolling walker (2 wheeled) Transfers: Sit to/from Stand Sit to Stand: Min guard;From elevated surface         General transfer comment: min guard for safety, pt had soiled bed, required assist for pericare following use of BSC  Ambulation/Gait Ambulation/Gait assistance: Supervision Ambulation Distance (Feet): 200 Feet Assistive device: Rolling walker (2 wheeled)     Gait velocity interpretation: at or above normal speed for age/gender General Gait Details: forward head, steady with RW, no LOB, HR 95 walking, no dizziness nor  SOB  Stairs            Wheelchair Mobility    Modified Rankin (Stroke Patients Only)       Balance Overall balance assessment: Modified Independent                                           Pertinent Vitals/Pain Pain Assessment: No/denies pain    Home Living Family/patient expects to be discharged to:: Private residence Living Arrangements: Spouse/significant other Available Help at Discharge: Family Type of Home: House Home Access: Stairs to enter   Technical brewer of Steps: stair lift Home Layout: Able to live on main level with bedroom/bathroom;Multi-level Home Equipment: Walker - 2 wheels;Walker - 4 wheels Additional Comments: lives with wife and adult child, stated he was I with ADLs    Prior Function Level of Independence: Independent with assistive device(s)         Comments: Amb with rollator. Pt is very HOH and hard to clarify PLOF.     Hand Dominance        Extremity/Trunk Assessment   Upper Extremity Assessment: Overall WFL for tasks assessed           Lower Extremity Assessment: Overall WFL for tasks assessed (tight hamstrings Bilaterally, knee ext AROM -15* B.)      Cervical / Trunk Assessment: Kyphotic (forward head)  Communication   Communication: HOH  Cognition Arousal/Alertness: Awake/alert Behavior During Therapy: WFL for tasks assessed/performed Overall Cognitive Status: Within Functional Limits for tasks assessed  General Comments      Exercises        Assessment/Plan    PT Assessment Patient needs continued PT services  PT Diagnosis Generalized weakness   PT Problem List Decreased mobility  PT Treatment Interventions Therapeutic activities;Therapeutic exercise;Functional mobility training   PT Goals (Current goals can be found in the Care Plan section) Acute Rehab PT Goals Patient Stated Goal: return home PT Goal Formulation: With patient Time For Goal  Achievement: 11/04/14 Potential to Achieve Goals: Good    Frequency Min 3X/week   Barriers to discharge        Co-evaluation               End of Session Equipment Utilized During Treatment: Gait belt Activity Tolerance: Patient tolerated treatment well Patient left: in chair;with call bell/phone within reach;with chair alarm set Nurse Communication: Mobility status         Time: 1131-1156 PT Time Calculation (min) (ACUTE ONLY): 25 min   Charges:   PT Evaluation $Initial PT Evaluation Tier I: 1 Procedure PT Treatments $Gait Training: 8-22 mins   PT G Codes:        Philomena Doheny 10/21/2014, 12:34 PM [phone]

## 2014-10-21 NOTE — Care Management Important Message (Signed)
Important Message  Patient Details  Name: SHAQUEL CHAVOUS MRN: [id number] Date of Birth: Jun 12, 1920   Medicare Important Message Given:  Yes-second notification given    Camillo Flaming 10/21/2014, 1:26 [place] Message  Patient Details  Name: MARQUAN VOKES MRN: [id number] Date of Birth: [birth year]   Medicare Important Message Given:  Yes-second notification given    Camillo Flaming 10/21/2014, 1:25 PM

## 2014-10-21 NOTE — Care Management Note (Signed)
Case Management Note  Patient Details  Name: Nathan Blackwell MRN: [id number] Date of Birth: December 09, 1920  Subjective/Objective: 81 y/o m admitted w/htn urgency. BM:WUXLKGMW,NUU.Active w/[facility] Edwinna-following for [place] orders if needed.Await PT recommendations.                   Action/Plan:d/c plan home.   Expected Discharge Date:   (unknown)               Expected Discharge Plan:  [place]  In-House Referral:     Discharge planning Services  CM Consult  Post Acute Care Choice:  Home Health (Active w/[facility]) Choice offered to:     DME Arranged:    DME Agency:     HH Arranged:    HH Agency:     Status of Service:  In process, will continue to follow  Medicare Important Message Given:    Date Medicare IM Given:    Medicare IM give by:    Date Additional Medicare IM Given:    Additional Medicare Important Message give by:     If discussed at [place] of Stay Meetings, dates discussed:    Additional Comments:  Dessa Phi, RN 10/21/2014, 12:40 PM

## 2014-10-21 NOTE — Progress Notes (Signed)
OT Cancellation Note  Patient Details Name: QASIM DIVELEY MRN: [id number] DOB: Jan 07, 1921   Cancelled Treatment:    Reason Eval/Treat Not Completed: Fatigue/lethargy limiting ability to participate  Spoke with wife. Will check on pt in the morning   Aiyah Scarpelli, Thereasa Parkin 10/21/2014, 2:39 PM

## 2014-10-21 NOTE — Discharge Instructions (Signed)
Furosemide tablets What is this medicine? FUROSEMIDE (fyoor [place] se mide) is a diuretic. It helps you make more urine and to lose salt and excess water from your body. This medicine is used to treat high blood pressure, and edema or swelling from heart, kidney, or liver disease. This medicine may be used for other purposes; ask your health care provider or pharmacist if you have questions. COMMON BRAND NAME(S): Delone, Lasix What should I tell my health care provider before I take this medicine? They need to know if you have any of these conditions: -abnormal blood electrolytes -diarrhea or vomiting -gout -heart disease -kidney disease, small amounts of urine, or difficulty passing urine -liver disease -an unusual or allergic reaction to furosemide, sulfa drugs, other medicines, foods, dyes, or preservatives -pregnant or trying to get pregnant -breast-feeding How should I use this medicine? Take this medicine by mouth with a glass of water. Follow the directions on the prescription label. You may take this medicine with or without food. If it upsets your stomach, take it with food or milk. Do not take your medicine more often than directed. Remember that you will need to pass more urine after taking this medicine. Do not take your medicine at a time of day that will cause you problems. Do not take at bedtime. Talk to your pediatrician regarding the use of this medicine in children. While this drug may be prescribed for selected conditions, precautions do apply. Overdosage: If you think you have taken too much of this medicine contact a poison control center or emergency room at once. NOTE: This medicine is only for you. Do not share this medicine with others. What if I miss a dose? If you miss a dose, take it as soon as you can. If it is almost time for your next dose, take only that dose. Do not take double or extra doses. What may interact with this medicine? -aspirin and aspirin-like  medicines -certain antibiotics -chloral hydrate -cisplatin -cyclosporine -digoxin -diuretics -laxatives -lithium -medicines for blood pressure -medicines that relax muscles for surgery -methotrexate -NSAIDs, medicines for pain and inflammation like ibuprofen, naproxen, or indomethacin -phenytoin -steroid medicines like prednisone or cortisone -sucralfate This list may not describe all possible interactions. Give your health care provider a list of all the medicines, herbs, non-prescription drugs, or dietary supplements you use. Also tell them if you smoke, drink alcohol, or use illegal drugs. Some items may interact with your medicine. What should I watch for while using this medicine? Visit your doctor or health care professional for regular checks on your progress. Check your blood pressure regularly. Ask your doctor or health care professional what your blood pressure should be, and when you should contact him or her. If you are a diabetic, check your blood sugar as directed. You may need to be on a special diet while taking this medicine. Check with your doctor. Also, ask how many glasses of fluid you need to drink a day. You must not get dehydrated. You may get drowsy or dizzy. Do not drive, use machinery, or do anything that needs mental alertness until you know how this drug affects you. Do not stand or sit up quickly, especially if you are an older patient. This reduces the risk of dizzy or fainting spells. Alcohol can make you more drowsy and dizzy. Avoid alcoholic drinks. This medicine can make you more sensitive to the sun. Keep out of the sun. If you cannot avoid being in the sun, wear protective clothing  and use sunscreen. Do not use sun lamps or tanning beds/booths. What side effects may I notice from receiving this medicine? Side effects that you should report to your doctor or health care professional as soon as possible: -blood in urine or stools -dry mouth -fever or  chills -hearing loss or ringing in the ears -irregular heartbeat -muscle pain or weakness, cramps -skin rash -stomach upset, pain, or nausea -tingling or numbness in the hands or feet -unusually weak or tired -vomiting or diarrhea -yellowing of the eyes or skin Side effects that usually do not require medical attention (report to your doctor or health care professional if they continue or are bothersome): -headache -loss of appetite -unusual bleeding or bruising This list may not describe all possible side effects. Call your doctor for medical advice about side effects. You may report side effects to FDA at 1-800-FDA-1088. Where should I keep my medicine? Keep out of the reach of children. Store at room temperature between 15 and 30 degrees C (59 and 86 degrees F). Protect from light. Throw away any unused medicine after the expiration date. NOTE: This sheet is a summary. It may not cover all possible information. If you have questions about this medicine, talk to your doctor, pharmacist, or health care provider.  2015, [employer]. (2009-02-08 16:24:50) Hydralazine tablets What is this medicine? HYDRALAZINE (hye DRAL a zeen) is a type of vasodilator. It relaxes blood vessels, increasing the blood and oxygen supply to your heart. This medicine is used to treat high blood pressure. This medicine may be used for other purposes; ask your health care provider or pharmacist if you have questions. COMMON BRAND NAME(S): Apresoline What should I tell my health care provider before I take this medicine? They need to know if you have any of these conditions: -blood vessel disease -heart disease including angina or history of heart attack -kidney or liver disease -systemic lupus erythematosus (SLE) -an unusual or allergic reaction to hydralazine, tartrazine dye, other medicines, foods, dyes, or preservatives -pregnant or trying to get pregnant -breast-feeding How should I use this  medicine? Take this medicine by mouth with a glass of water. Follow the directions on the prescription label. Take your doses at regular intervals. Do not take your medicine more often than directed. Do not stop taking except on the advice of your doctor or health care professional. Talk to your pediatrician regarding the use of this medicine in children. Special care may be needed. While this drug may be prescribed for children for selected conditions, precautions do apply. Overdosage: If you think you have taken too much of this medicine contact a poison control center or emergency room at once. NOTE: This medicine is only for you. Do not share this medicine with others. What if I miss a dose? If you miss a dose, take it as soon as you can. If it is almost time for your next dose, take only that dose. Do not take double or extra doses. What may interact with this medicine? -medicines for high blood pressure -medicines for mental depression This list may not describe all possible interactions. Give your health care provider a list of all the medicines, herbs, non-prescription drugs, or dietary supplements you use. Also tell them if you smoke, drink alcohol, or use illegal drugs. Some items may interact with your medicine. What should I watch for while using this medicine? Visit your doctor or health care professional for regular checks on your progress. Check your blood pressure and pulse rate  regularly. Ask your doctor or health care professional what your blood pressure and pulse rate should be and when you should contact him or her. You may get drowsy or dizzy. Do not drive, use machinery, or do anything that needs mental alertness until you know how this medicine affects you. Do not stand or sit up quickly, especially if you are an older patient. This reduces the risk of dizzy or fainting spells. Alcohol may interfere with the effect of this medicine. Avoid alcoholic drinks. Do not treat yourself  for coughs, colds, or pain while you are taking this medicine without asking your doctor or health care professional for advice. Some ingredients may increase your blood pressure. What side effects may I notice from receiving this medicine? Side effects that you should report to your doctor or health care professional as soon as possible: -chest pain, or fast or irregular heartbeat -fever, chills, or sore throat -numbness or tingling in the hands or feet -shortness of breath -skin rash, redness, blisters or itching -stiff or swollen joints -sudden weight gain -swelling of the feet or legs -swollen lymph glands -unusual weakness Side effects that usually do not require medical attention (report to your doctor or health care professional if they continue or are bothersome): -diarrhea, or constipation -headache -loss of appetite -nausea, vomiting This list may not describe all possible side effects. Call your doctor for medical advice about side effects. You may report side effects to FDA at 1-800-FDA-1088. Where should I keep my medicine? Keep out of the reach of children. Store at room temperature between 15 and 30 degrees C (59 and 86 degrees F). Throw away any unused medicine after the expiration date. NOTE: This sheet is a summary. It may not cover all possible information. If you have questions about this medicine, talk to your doctor, pharmacist, or health care provider.  2015, [employer]. (2007-07-05 15:44:58) Isosorbide Mononitrate extended-release tablets What is this medicine? ISOSORBIDE MONONITRATE (eye soe SOR bide mon [place] NYE trate) is a vasodilator. It relaxes blood vessels, increasing the blood and oxygen supply to your heart. This medicine is used to prevent chest pain caused by angina. It will not help to stop an episode of chest pain. This medicine may be used for other purposes; ask your health care provider or pharmacist if you have questions. COMMON BRAND  NAME(S): Imdur, Isotrate ER What should I tell my health care provider before I take this medicine? They need to know if you have any of these conditions: -previous heart attack or heart failure -an unusual or allergic reaction to isosorbide mononitrate, nitrates, other medicines, foods, dyes, or preservatives -pregnant or trying to get pregnant -breast-feeding How should I use this medicine? Take this medicine by mouth with a glass of water. Follow the directions on the prescription label. Do not crush or chew. Take your medicine at regular intervals. Do not take your medicine more often than directed. Do not stop taking this medicine except on the advice of your doctor or health care professional. Talk to your pediatrician regarding the use of this medicine in children. Special care may be needed. Overdosage: If you think you have taken too much of this medicine contact a poison control center or emergency room at once. NOTE: This medicine is only for you. Do not share this medicine with others. What if I miss a dose? If you miss a dose, take it as soon as you can. If it is almost time for your next dose, take only  that dose. Do not take double or extra doses. What may interact with this medicine? Do not take this medicine with any of the following medications: -medicines used to treat erectile dysfunction (ED) like avanafil, sildenafil, tadalafil, and vardenafil -riociguat This medicine may also interact with the following medications: -medicines for high blood pressure -other medicines for angina or heart failure This list may not describe all possible interactions. Give your health care provider a list of all the medicines, herbs, non-prescription drugs, or dietary supplements you use. Also tell them if you smoke, drink alcohol, or use illegal drugs. Some items may interact with your medicine. What should I watch for while using this medicine? Check your heart rate and blood pressure  regularly while you are taking this medicine. Ask your doctor or health care professional what your heart rate and blood pressure should be and when you should contact him or her. Tell your doctor or health care professional if you feel your medicine is no longer working. You may get dizzy. Do not drive, use machinery, or do anything that needs mental alertness until you know how this medicine affects you. To reduce the risk of dizzy or fainting spells, do not sit or stand up quickly, especially if you are an older patient. Alcohol can make you more dizzy, and increase flushing and rapid heartbeats. Avoid alcoholic drinks. Do not treat yourself for coughs, colds, or pain while you are taking this medicine without asking your doctor or health care professional for advice. Some ingredients may increase your blood pressure. What side effects may I notice from receiving this medicine? Side effects that you should report to your doctor or health care professional as soon as possible: -bluish discoloration of lips, fingernails, or palms of hands -irregular heartbeat, palpitations -low blood pressure -nausea, vomiting -persistent headache -unusually weak or tired Side effects that usually do not require medical attention (report to your doctor or health care professional if they continue or are bothersome): -flushing of the face or neck -rash This list may not describe all possible side effects. Call your doctor for medical advice about side effects. You may report side effects to FDA at 1-800-FDA-1088. Where should I keep my medicine? Keep out of the reach of children. Store between 15 and 30 degrees C (59 and 86 degrees F). Keep container tightly closed. Throw away any unused medicine after the expiration date. NOTE: This sheet is a summary. It may not cover all possible information. If you have questions about this medicine, talk to your doctor, pharmacist, or health care provider.  2015,  [employer]. (2012-12-20 14:48:19)

## 2014-10-21 NOTE — Discharge Summary (Addendum)
Physician Discharge Summary  Nathan Blackwell CVE:[id number] DOB: [birth year] DOA: 10/19/2014  PCP: Nathan Cower, MD  Admit date: 10/19/2014 Discharge date: 10/21/2014  Recommendations for Outpatient Follow-up:  1. Would recommend holding lasix until renal function improves. Current creatinine is 2.04. 2. Continue hydralazine, imdur for blood pressure control  Discharge Diagnoses:  Active Problems:   Diabetes   Dementia   OSA (obstructive sleep apnea)   Essential hypertension   Stage 3 chronic renal impairment associated with type 2 diabetes mellitus   Nausea and vomiting   Diastolic congestive heart failure   Hypertensive urgency   Elevated troponin   Malnutrition of moderate degree    Discharge Condition: stable   Diet recommendation: as tolerated   History of present illness:  80 year old male with a past medical history of diabetes, hypertension, osteoporosis who presented to [name] with sudden onset dizziness followed by nausea and vomiting. CT head and MRI brain were unrevealing of acute findings.  Hospital Course:  Sudden onset dizziness, giddiness / Nausea and vomiting - Of obvious concern was TIA/CVA on admission. - His symptoms however likey from accelerated HTN on admission - CT head and MRI brain with no acute intracranial findings - Per PT no PT follow up required - Orders placed for [facility] needs - Carotid doppler demonstrated 60-79% ICA stenosis on right and 40-59% on left - 2 D ECHO demonstrated preserved EF - Abd x ray with no acute findings   Accelerated hypertension - Blood pressure was very high when pt presented to the hospital.  - BP better now - Continue imdur and hydralazine   Minimally elevated troponin - Likely secondary to demand ischemia from renal insufficiency - No chest [ain - No acute ischemic changes on 12 lead EKG  Chronic kidney disease, stage 3  Rising creatinine likely from lasix which we recommend to continue to hold unitl renal function  improves  Diabetes mellitus type 2 - Continue januvia   Chronic diastolic congestive heart failure - Compensated - Lasix on hold due to renal insufficiency   QT prolongation on EKG - Continue to monitor.   Moderate protein calorie malnutrition  - Due to chronic illness, dementia - Nutrition consulted   Depression - Continue Zyprexa and Zoloft   BPH - Continue Flomax  Code status: DNR/DNI   Signed:  [name], MD  [facility] 10/21/2014, 8:47 PM  Pager #: [phone]  Time spent in minutes: more than 30 minutes   Discharge Exam: Filed Vitals:   10/21/14 1450  BP: 128/54  Pulse: 60  Temp:   Resp:    Filed Vitals:   10/21/14 0309 10/21/14 0502 10/21/14 1326 10/21/14 1450  BP: 123/41 144/59 174/60 128/54  Pulse: 56 63 52 60  Temp: 98.4 F (36.9 C) 98.1 F (36.7 C) 97.7 F (36.5 C)   TempSrc: Oral Oral Oral   Resp: 16 18 16    Height:      Weight:  71.668 kg (158 lb)    SpO2: 95% 94% 100%     General: Pt is alert, follows commands appropriately, not in acute distress Cardiovascular: Regular rate and rhythm, S1/S2 appreciated  Respiratory: Clear to auscultation bilaterally, no wheezing, no crackles, no rhonchi Abdominal: Soft, non tender, non distended, bowel sounds +, no guarding Extremities: no cyanosis, pulses palpable bilaterally DP and PT Neuro: Grossly nonfocal  Discharge Instructions  Discharge Instructions    Call MD for:  difficulty breathing, headache or visual disturbances    Complete by:  As directed  Call MD for:  persistant nausea and vomiting    Complete by:  As directed      Call MD for:  severe uncontrolled pain    Complete by:  As directed      Diet - low sodium heart healthy    Complete by:  As directed      Discharge instructions    Complete by:  As directed   1. Would recommend holding lasix until renal function improves. Current creatinine is 2.04. 2. Continue hydralazine, imdur for blood pressure control      Increase activity slowly    Complete by:  As directed             Medication List    STOP taking these medications        furosemide 40 MG tablet  Commonly known as:  LASIX     ipratropium-albuterol 0.5-2.5 (3) MG/3ML Soln  Commonly known as:  DUONEB      TAKE these medications        acetaminophen 325 MG tablet  Commonly known as:  TYLENOL  Take 325 mg by mouth every 6 (six) hours as needed for mild pain, moderate pain or headache.     aspirin 81 MG EC tablet  Take 1 tablet (81 mg total) by mouth daily.     bisacodyl 10 MG suppository  Commonly known as:  DULCOLAX  Place 1 suppository (10 mg total) rectally daily as needed for moderate constipation.     cholecalciferol 1000 UNITS tablet  Commonly known as:  VITAMIN D  Take 1,000 Units by mouth every morning.     citalopram 20 MG tablet  Commonly known as:  CELEXA  Take 1 tablet (20 mg total) by mouth every morning.     famotidine 20 MG tablet  Commonly known as:  PEPCID  Take 1 tablet (20 mg total) by mouth daily.     feeding supplement (ENSURE ENLIVE) Liqd  Take 237 mLs by mouth 2 (two) times daily between meals.     glucose blood test strip  Use as directed once daily to check blood sugar.  Diagnosis code E11.9     hydrALAZINE 25 MG tablet  Commonly known as:  APRESOLINE  Take 1 tablet (25 mg total) by mouth 3 (three) times daily.     isosorbide mononitrate 30 MG 24 hr tablet  Commonly known as:  IMDUR  Take 1 tablet (30 mg total) by mouth every morning.     lansoprazole 30 MG capsule  Commonly known as:  PREVACID  Take 1 capsule (30 mg total) by mouth every morning.     levothyroxine 100 MCG tablet  Commonly known as:  SYNTHROID, LEVOTHROID  Take 1 tablet (100 mcg total) by mouth daily before breakfast.     lipase/protease/amylase 12000 UNITS Cpep capsule  Commonly known as:  CREON  Take 1 capsule (12,000 Units total) by mouth 3 (three) times daily with meals.     OLANZapine 5 MG tablet   Commonly known as:  ZYPREXA  Take 1 tablet (5 mg total) by mouth every morning.     potassium chloride 10 MEQ tablet  Commonly known as:  K-DUR  Take 1 tablet (10 mEq total) by mouth daily.     QUEtiapine 50 MG tablet  Commonly known as:  SEROQUEL  TAKE 1 TABLET (50 MG TOTAL) BY MOUTH AT BEDTIME.     sitaGLIPtin 50 MG tablet  Commonly known as:  JANUVIA  Take 1 tablet (50  mg total) by mouth every morning.     tamsulosin 0.4 MG Caps capsule  Commonly known as:  FLOMAX  Take 1 capsule (0.4 mg total) by mouth at bedtime.           Follow-up Information    Follow up with Nathan Cower, MD. Schedule an appointment as soon as possible for a visit in 1 week.   Specialties:  Internal Medicine, Radiology   Why:  Follow up appt after recent hospitalization   Contact information:   [address] [place]  [id number] [phone]        The results of significant diagnostics from this hospitalization (including imaging, microbiology, ancillary and laboratory) are listed below for reference.    Significant Diagnostic Studies: Ct Head Wo Contrast  10/19/2014   CLINICAL DATA:  None.  Vomiting today.  EXAM: CT HEAD WITHOUT CONTRAST  TECHNIQUE: Contiguous axial images were obtained from the base of the skull through the vertex without intravenous contrast.  COMPARISON:  MRI 01/08/2013  FINDINGS: Mild chronic small vessel disease throughout the deep white matter. Mild age related volume loss. No acute intracranial abnormality. Specifically, no hemorrhage, hydrocephalus, mass lesion, acute infarction, or significant intracranial injury. No acute calvarial abnormality. Mastoid air cells are clear. Mucosal thickening in the maxillary sinuses.  IMPRESSION: No acute intracranial abnormality.   Electronically Signed   By: Rolm Baptise M.D.   On: 10/19/2014 16:11   Mr Nathan Blackwell Wo Contrast  10/20/2014   CLINICAL DATA:  81 year old male with weakness dizziness and vomiting since yesterday.  Initial encounter.  EXAM: MRA HEAD WITHOUT CONTRAST  TECHNIQUE: Angiographic images of the Circle of Willis were obtained using MRA technique without intravenous contrast.  COMPARISON:  Brain MRI without contrast 10/19/2014 and earlier.  FINDINGS: Dominant distal left vertebral artery. Moderately irregular but no stenosis in the non dominant distal right vertebral artery. Both PICA origins remain patent. Patent vertebrobasilar junction and no basilar artery stenosis. SCA and PCA origins are normal. Diminutive posterior communicating arteries. Bilateral PCA branches are within normal limits.  Antegrade flow in both ICA siphons. No siphon stenosis. Ophthalmic and posterior communicating artery origins are within normal limits. Carotid termini are normal along with MCA and ACA origins.  Anterior communicating artery and visualized bilateral ACA branches are within normal limits. Bilateral M1 segments, MCA bifurcations, and visualized bilateral MCA branches are within normal limits.  IMPRESSION: Negative. Mild for age intracranial atherosclerosis, primarily involving the non dominant distal right vertebral artery.   Electronically Signed   By: Genevie Ann M.D.   On: 10/20/2014 14:56   Mr Brain Wo Contrast  10/19/2014   CLINICAL DATA:  Nonstop Emesis since earlier today. Weakness and hypertension.  EXAM: MRI HEAD WITHOUT CONTRAST  TECHNIQUE: Multiplanar, multiecho pulse sequences of the brain and surrounding structures were obtained without intravenous contrast.  COMPARISON:  CT head earlier today.  MR brain 01/08/2013.  FINDINGS: No evidence for acute stroke, acute hemorrhage, mass lesion, or extra-axial fluid. Hydrocephalus ex vacuo. Moderate T2 and FLAIR hyperintensities throughout the periventricular and subcortical white matter representing chronic microvascular ischemic change. Partial empty sella. No tonsillar herniation. Upper cervical region unremarkable. Chronic sinusitis. Negative orbits. Trace LEFT mastoid  effusion. No osseous lesions.  Compared with prior MR from 2014, similar appearance noted.  IMPRESSION: Chronic changes as described.  No acute intracranial findings.   Electronically Signed   By: Staci Righter M.D.   On: 10/19/2014 18:30   Dg Abd Acute W/chest  10/19/2014   CLINICAL DATA:  Nausea, vomiting and abdominal distension with weakness today.  EXAM: DG ABDOMEN ACUTE W/ 1V CHEST  COMPARISON:  08/17/2014  FINDINGS: Lungs are somewhat hypoinflated without consolidation or effusion. Cardiomediastinal silhouette is within normal. There is mild calcified plaque over the aortic arch. There are mild degenerative changes of the spine.  Abdominal films demonstrate a relative paucity of bowel gas with mild fecal retention over the rectum. No evidence of free peritoneal air. Surgical suture line over the right lower quadrant likely due to prior bowel surgery. Remainder of the exam is unchanged.  IMPRESSION: Nonobstructive bowel gas pattern.  No acute cardiopulmonary disease.   Electronically Signed   By: Marin Olp M.D.   On: 10/19/2014 16:28    Microbiology: No results found for this or any previous visit (from the past 240 hour(s)).   Labs: Basic Metabolic Panel:  Recent Labs Lab 10/19/14 1519 10/20/14 0215 10/21/14 0510  NA 142 144 143  K 3.6 3.2* 3.1*  CL 105 105 106  CO2 25 26 29   GLUCOSE 211* 197* 102*  BUN 31* 28* 36*  CREATININE 1.56* 1.59* 2.04*  CALCIUM 8.5* 8.7* 8.4*  MG  --  1.8  --   PHOS  --  3.2  --    Liver Function Tests:  Recent Labs Lab 10/19/14 1519 10/20/14 0215  AST 20 23  ALT 12* 14*  ALKPHOS 73 76  BILITOT 0.7 0.9  PROT 7.1 7.4  ALBUMIN 3.8 3.8    Recent Labs Lab 10/19/14 1519  LIPASE 23   No results for input(s): AMMONIA in the last 168 hours. CBC:  Recent Labs Lab 10/19/14 1519 10/20/14 0215 10/21/14 0510  WBC 10.1 9.5 8.6  HGB 12.5* 12.7* 10.2*  HCT 37.7* 37.9* 32.3*  MCV 95.4 94.8 96.1  PLT 212 223 194   Cardiac  Enzymes:  Recent Labs Lab 10/19/14 1953 10/20/14 0215 10/20/14 0745 10/21/14 0510  TROPONINI 0.04* 0.04* 0.06* 0.05*   BNP: BNP (last 3 results)  Recent Labs  08/18/14 0345  BNP 266.2*    ProBNP (last 3 results) No results for input(s): PROBNP in the last 8760 hours.  CBG:  Recent Labs Lab 10/20/14 1644 10/20/14 2055 10/21/14 0733 10/21/14 1153 10/21/14 1609  GLUCAP 188* 117* 98 167* 176*

## 2014-10-22 ENCOUNTER — Telehealth: Payer: Self-pay | Admitting: *Deleted

## 2014-10-22 DIAGNOSIS — E039 Hypothyroidism, unspecified: Secondary | ICD-10-CM

## 2014-10-22 DIAGNOSIS — I10 Essential (primary) hypertension: Secondary | ICD-10-CM

## 2014-10-22 LAB — GLUCOSE, CAPILLARY: Glucose-Capillary: 113 mg/dL — ABNORMAL HIGH (ref 65–99)

## 2014-10-22 NOTE — Progress Notes (Signed)
Patient seen and examined at the bedside. No acute overnight events.  Please refer to discharge summary completed 10/21/2014. No new changes in medical management since 10/21/2014. I spoke with patient's wife who is agreeable with discharge plan. Home health orders in place. Medically stable for discharge home today.  [name] [facility] [phone]

## 2014-10-22 NOTE — Care Management Note (Addendum)
Case Management Note  Patient Details  Name: Nathan Blackwell MRN: [id number] Date of Birth: Jul 29, 1920  Subjective/Objective:                   Presented with  Episodes of nausea vomiting generalized weakness and dizzy spells.  Action/Plan: Discharge planning  Expected Discharge Date:   (unknown)               Expected Discharge Plan:  [place]  In-House Referral:     Discharge planning Services  CM Consult  Post Acute Care Choice:  Home Health (Active w/[facility]HHPT) Choice offered to:  Adult Children, Spouse, Patient  DME Arranged:    DME Agency:     HH Arranged:    HH Agency:  [facility]  Status of Service:  Completed, signed off  Medicare Important Message Given:  Yes-second notification given Date Medicare IM Given:    Medicare IM give by:    Date Additional Medicare IM Given:    Additional Medicare Important Message give by:     If discussed at [place] of Stay Meetings, dates discussed:    Additional Comments: CM notified pt was discharged by RN prior to [facility] services set up.  CM called pt (who is very [facility]) who handed phone off to daughter, Nathan Blackwell [phone] cell, and gave permission to arrange [facility] services with daughter.  Nathan Blackwell confirms pt was active with [facility] and this is their choice of home health agency.  Address and additional contact of Nathan Blackwell's cell number verified with Nathan Blackwell.  Nathan Blackwell requests she be at the initial home assessment as her parent's are elderly and HOH. Referral called to [facility] [phone] and rep rquested I fax facesheet with additional contact information, face to face, orders, H&P and DC summary to [phone].  CM faxed requested to [facility] with request to contact Nathan Blackwell to schedule SOC.  No other CM needs were communicated. Nathan Catholic, RN 10/22/2014, 11:57 AM

## 2014-10-22 NOTE — Telephone Encounter (Signed)
Transition Care Management Follow-up Telephone Call   Date discharged? 10/21/14   How have you been since you were released from the hospital? Called pt spoke with wife she stated pt his doing ok   Do you understand why you were in the hospital? YES   Do you understand the discharge instructions? YES   Where were you discharged to? Home   Items Reviewed:  Medications reviewed: YES  Allergies reviewed: YES  Dietary changes reviewed: NO  Referrals reviewed: No referral needed   Functional Questionnaire:   Activities of Daily Living (ADLs):   She states he are independent in the following: feeding, grooming, toileting and dressing States he require assistance with the following: ambulation, bathing and hygiene and continence   Any transportation issues/concerns?: NO   Any patient concerns? NO   Confirmed importance and date/time of follow-up visits scheduled YES, wife stated appt already set up for 10/29/14  Provider Appointment booked with Dr. Jenny Reichmann  Confirmed with patient if condition begins to worsen call PCP or go to the ER.  Patient was given the office number and encouraged to call back with question or concerns.  YES

## 2014-10-28 ENCOUNTER — Telehealth: Payer: Self-pay | Admitting: Internal Medicine

## 2014-10-28 DIAGNOSIS — R269 Unspecified abnormalities of gait and mobility: Secondary | ICD-10-CM | POA: Diagnosis not present

## 2014-10-28 DIAGNOSIS — Z8701 Personal history of pneumonia (recurrent): Secondary | ICD-10-CM | POA: Diagnosis not present

## 2014-10-28 NOTE — Telephone Encounter (Signed)
[name] called to advise that Nathan Blackwell has been discharged from the hiospital, she wanted verbals to resume nursing, pt, ot, and home health aid. Frequency not determined

## 2014-10-28 NOTE — Telephone Encounter (Signed)
Ok for verbal 

## 2014-10-28 NOTE — Telephone Encounter (Signed)
Verbal authorization given. 

## 2014-10-28 NOTE — Telephone Encounter (Signed)
Verbal authorization ok?

## 2014-10-29 ENCOUNTER — Other Ambulatory Visit (INDEPENDENT_AMBULATORY_CARE_PROVIDER_SITE_OTHER): Payer: Medicare Other

## 2014-10-29 ENCOUNTER — Encounter: Payer: Self-pay | Admitting: Internal Medicine

## 2014-10-29 ENCOUNTER — Ambulatory Visit (INDEPENDENT_AMBULATORY_CARE_PROVIDER_SITE_OTHER): Payer: Medicare Other | Admitting: Internal Medicine

## 2014-10-29 VITALS — BP 110/52 | HR 57 | Temp 97.7°F | Ht 69.0 in | Wt 162.0 lb

## 2014-10-29 DIAGNOSIS — I10 Essential (primary) hypertension: Secondary | ICD-10-CM

## 2014-10-29 DIAGNOSIS — I5032 Chronic diastolic (congestive) heart failure: Secondary | ICD-10-CM

## 2014-10-29 DIAGNOSIS — N183 Chronic kidney disease, stage 3 unspecified: Secondary | ICD-10-CM

## 2014-10-29 DIAGNOSIS — R269 Unspecified abnormalities of gait and mobility: Secondary | ICD-10-CM | POA: Diagnosis not present

## 2014-10-29 DIAGNOSIS — Z8701 Personal history of pneumonia (recurrent): Secondary | ICD-10-CM | POA: Diagnosis not present

## 2014-10-29 LAB — BASIC METABOLIC PANEL
BUN: 32 mg/dL — AB (ref 6–23)
CALCIUM: 9.6 mg/dL (ref 8.4–10.5)
CO2: 31 mEq/L (ref 19–32)
CREATININE: 1.81 mg/dL — AB (ref 0.40–1.50)
Chloride: 101 mEq/L (ref 96–112)
GFR: 37.44 mL/min — AB (ref >60.00)
Glucose, Bld: 119 mg/dL — ABNORMAL HIGH (ref 70–99)
POTASSIUM: 3.5 meq/L (ref 3.5–5.1)
Sodium: 140 mEq/L (ref 135–145)

## 2014-10-29 NOTE — Assessment & Plan Note (Signed)
By hx it appears 80 bid may have been too much for pt with variable po fluid intake per wife, but then 80 qam was not enough per renal.  Ok to re-start lasix at 80 qam, but ALSO 80 in afternoon QOD only.  Wife will help as pt will not be able to handle this compliance wise due to dementia

## 2014-10-29 NOTE — Assessment & Plan Note (Signed)
With AKI on CKD - 3 recent, for f/u lab today

## 2014-10-29 NOTE — Progress Notes (Signed)
Pre visit review using our clinic review tool, if applicable. No additional management support is needed unless otherwise documented below in the visit note. 

## 2014-10-29 NOTE — Patient Instructions (Signed)
OK to restart the lasix at 80 mg every AM, BUT 80 mg in the afternoon EVERY OTHER day only.  Please continue all other medications as before, and refills have been done if requested.  Please have the pharmacy call with any other refills you may need.  Please continue your efforts at being more active, low cholesterol diet, and weight control.  You are otherwise up to date with prevention measures today.  Please keep your appointments with your specialists as you may have planned  Please go to the LAB in the Basement (turn left off the elevator) for the tests to be done today  You will be contacted by phone if any changes need to be made immediately.  Otherwise, you will receive a letter about your results with an explanation, but please check with MyChart first.  Please remember to sign up for MyChart if you have not done so, as this will be important to you in the future with finding out test results, communicating by private email, and scheduling acute appointments online when needed.  Please return in 3 months

## 2014-10-29 NOTE — Progress Notes (Signed)
Subjective:    Patient ID: XAIDEN FLEIG, male    DOB: Jan 17, 1921, 81 y.o.   MRN: [id number]  HPI  Here to f/u; overall doing ok,  Pt denies chest pain, increasing sob or doe, wheezing, orthopnea, PND, palpitations, dizziness or syncope, but has had recent 8 lb wt gain since aug 18, and worsening LE edema, assoc with holding lasix due to recent hospn with AKI after wife reports renal/Dr Posey Pronto had increased the lasix to 80 bid from 80 qd..  Pt denies new neurological symptoms such as new headache, or facial or extremity weakness or numbness.  Pt denies polydipsia, polyuria, or low sugar episode.   Pt denies new neurological symptoms such as new headache, or facial or extremity weakness or numbness.   Pt states overall good compliance with meds, mostly trying to follow appropriate diet, with wt overall stable,  but little exercise however Still with some dizziness, meclzine may help somewhat, no furthern n/v, and BP improved Wt Readings from Last 3 Encounters:  10/29/14 162 lb (73.483 kg)  10/22/14 154 lb 5.2 oz (70 kg)  10/19/14 165 lb (74.844 kg)   Past Medical History  Diagnosis Date  . Diabetes mellitus type II   . Hyperlipidemia   . Hypertension   . Osteoporosis   . Prostatic hypertrophy     benign  . OSA (obstructive sleep apnea)   . Rotator cuff syndrome     chronic  . Elevated PSA   . Hypothyroidism   . Nephrolithiasis   . Thyroid nodule   . GERD (gastroesophageal reflux disease)   . Colitis, Clostridium difficile     presumed 11/08  . Dementia   . Anemia     NOS  . Vitamin B 12 deficiency   . BRADYCARDIA 06/10/2009  . COLON CANCER, HX OF 09/20/2006  . HYPOTHYROIDISM 01/14/2007  . DIABETES MELLITUS, TYPE II 09/20/2006  . HYPERLIPIDEMIA 09/20/2006  . ANEMIA-NOS 06/23/2008  . DEMENTIA 05/14/2007  . DEPRESSION 02/27/2007  . SLEEP APNEA, OBSTRUCTIVE 01/14/2007  . HYPERTENSION 09/20/2006  . GERD 01/14/2007  . OSTEOPOROSIS 10/08/2006  . PERIPHERAL EDEMA 09/03/2007  . RENAL  INSUFFICIENCY 04/01/2010  . B12 deficiency 05/25/2010  . CLOSTRIDIUM DIFFICILE COLITIS 01/30/2007  . THYROID NODULE 01/14/2007  . CONSTIPATION, RECURRENT 01/06/2010  . BENIGN PROSTATIC HYPERTROPHY 10/08/2006  . ARTHRITIS 02/23/2007  . BACK PAIN, THORACIC REGION 06/04/2008  . COLONIC POLYPS, HX OF 10/08/2006  . NEPHROLITHIASIS, HX OF 01/14/2007  . Cancer of colon dx'd 1998  . Cancer of vocal cord dx'd 1998  . 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 DVT of lower limb, acute 01/12/2011  . Sleep apnea     CPAP dependent  . Diastolic congestive heart failure   . Hypothyroidism   . Diabetes mellitus    Past Surgical History  Procedure Laterality Date  . Cholecystectomy  1999  . Inguinal herniorrhapy  1984  . Appendectomy  1998  . Colon resection  1998  . Skin cancer extraction      right ear-extensive scar  . Cataract extraction, bilateral    . Nm lexiscan myoview ltd  09/02/2013    Normal LV function and wall motion. EF 62%; rate dependent LBBB with Lexiscan, Low Risk fixed inferior defect suggestive of diaphragmatic attenuation and not infarct with normal wall motion.  . Transthoracic echocardiogram  08/03/2009    Normal LV size. Mild LVH. EF 60-65%. Gr 2 DD, mild MR.  . Esophagogastroduodenoscopy (egd) with propofol N/A 12/19/2013    Procedure:  ESOPHAGOGASTRODUODENOSCOPY (EGD) WITH PROPOFOL;  Surgeon: Inda Castle, MD;  Location: [facility];  Service: Endoscopy;  Laterality: N/A;  . Savory dilation N/A 12/19/2013    Procedure: SAVORY DILATION;  Surgeon: Inda Castle, MD;  Location: [facility];  Service: Endoscopy;  Laterality: N/A;  With Fluoroscopy  . Esophagogastroduodenoscopy N/A 12/30/2013    Procedure: ESOPHAGOGASTRODUODENOSCOPY (EGD);  Surgeon: Inda Castle, MD;  Location: [facility];  Service: Endoscopy;  Laterality: N/A;  . Savory dilation N/A 12/30/2013    Procedure: SAVORY DILATION;  Surgeon: Inda Castle, MD;  Location: [facility];  Service: Endoscopy;   Laterality: N/A;    reports that he has never smoked. He has never used smokeless tobacco. He reports that he does not drink alcohol or use illicit drugs. family history includes Diabetes in his mother; Stroke in his father. Allergies  Allergen Reactions  . Biaxin [Clarithromycin] Other (See Comments)    Unknown   . Other Other (See Comments)    Nephrologist has told patient not to eat many different foods (tomatoes, Green Foods, etc).   . Sulfa Antibiotics Other (See Comments)    Unknown    Current Outpatient Prescriptions on File Prior to Visit  Medication Sig Dispense Refill  . acetaminophen (TYLENOL) 325 MG tablet Take 325 mg by mouth every 6 (six) hours as needed for mild pain, moderate pain or headache.    Marland Kitchen aspirin EC 81 MG EC tablet Take 1 tablet (81 mg total) by mouth daily. 30 tablet 0  . bisacodyl (DULCOLAX) 10 MG suppository Place 1 suppository (10 mg total) rectally daily as needed for moderate constipation. 12 suppository 0  . cholecalciferol (VITAMIN D) 1000 UNITS tablet Take 1,000 Units by mouth every morning.     . citalopram (CELEXA) 20 MG tablet Take 1 tablet (20 mg total) by mouth every morning. 30 tablet 0  . famotidine (PEPCID) 20 MG tablet Take 1 tablet (20 mg total) by mouth daily. 30 tablet 0  . feeding supplement, ENSURE ENLIVE, (ENSURE ENLIVE) LIQD Take 237 mLs by mouth 2 (two) times daily between meals. 237 mL 12  . glucose blood test strip Use as directed once daily to check blood sugar.  Diagnosis code E11.9 300 each 3  . hydrALAZINE (APRESOLINE) 25 MG tablet Take 1 tablet (25 mg total) by mouth 3 (three) times daily. 90 tablet 0  . isosorbide mononitrate (IMDUR) 30 MG 24 hr tablet Take 1 tablet (30 mg total) by mouth every morning. 90 tablet 3  . lansoprazole (PREVACID) 30 MG capsule Take 1 capsule (30 mg total) by mouth every morning. 90 capsule 3  . levothyroxine (SYNTHROID, LEVOTHROID) 100 MCG tablet Take 1 tablet (100 mcg total) by mouth daily before  breakfast. 90 tablet 3  . lipase/protease/amylase (CREON) 12000 UNITS CPEP capsule Take 1 capsule (12,000 Units total) by mouth 3 (three) times daily with meals. 270 capsule 3  . OLANZapine (ZYPREXA) 5 MG tablet Take 1 tablet (5 mg total) by mouth every morning. 30 tablet 0  . potassium chloride (K-DUR) 10 MEQ tablet Take 1 tablet (10 mEq total) by mouth daily. 30 tablet 0  . QUEtiapine (SEROQUEL) 50 MG tablet TAKE 1 TABLET (50 MG TOTAL) BY MOUTH AT BEDTIME. (Patient taking differently: Take 50 mg by mouth at bedtime. ) 30 tablet 0  . sitaGLIPtin (JANUVIA) 50 MG tablet Take 1 tablet (50 mg total) by mouth every morning. 90 tablet 3  . tamsulosin (FLOMAX) 0.4 MG CAPS capsule Take 1 capsule (0.4  mg total) by mouth at bedtime. 90 capsule 3   No current facility-administered medications on file prior to visit.   Review of Systems  Constitutional: Negative for unusual diaphoresis or night sweats HENT: Negative for ringing in ear or discharge Eyes: Negative for double vision or worsening visual disturbance.  Respiratory: Negative for choking and stridor.   Gastrointestinal: Negative for vomiting or other signifcant bowel change Genitourinary: Negative for hematuria or change in urine volume.  Musculoskeletal: Negative for other MSK pain or swelling Skin: Negative for color change and worsening wound.  Neurological: Negative for tremors and numbness other than noted  Psychiatric/Behavioral: Negative for decreased concentration or agitation other than above       Objective:   Physical Exam BP 110/52 mmHg  Pulse 57  Temp(Src) 97.7 F (36.5 C) (Oral)  Ht 5\' 9"  (1.753 m)  Wt 162 lb (73.483 kg)  BMI 23.91 kg/m2  SpO2 97% VS noted,  Constitutional: Pt appears in no significant distress HENT: Head: NCAT.  Right Ear: External ear normal.  Left Ear: External ear normal.  Eyes: . Pupils are equal, round, and reactive to light. Conjunctivae and EOM are normal Neck: Normal range of motion. Neck  supple.  Cardiovascular: Normal rate and regular rhythm.   Pulmonary/Chest: Effort normal and breath sounds without rales or wheezing.  Abd:  Soft, NT, ND, + BS Neurological: Pt is alert. _+ baseline confused , motor grossly intact, severe HOH Skin: Skin is warm. No rash, trace to 1+ bilat LE edema to mid calves Psychiatric: Pt behavior is normal. No agitation.     Assessment & Plan:

## 2014-10-29 NOTE — Assessment & Plan Note (Signed)
stable overall by history and exam, recent data reviewed with pt, and pt to continue medical treatment as before,  to f/u any worsening symptoms or concerns BP Readings from Last 3 Encounters:  10/29/14 110/52  10/22/14 154/74  09/22/14 124/56

## 2014-11-02 DIAGNOSIS — R269 Unspecified abnormalities of gait and mobility: Secondary | ICD-10-CM | POA: Diagnosis not present

## 2014-11-02 DIAGNOSIS — Z8701 Personal history of pneumonia (recurrent): Secondary | ICD-10-CM | POA: Diagnosis not present

## 2014-11-03 ENCOUNTER — Telehealth: Payer: Self-pay | Admitting: Internal Medicine

## 2014-11-03 NOTE — Telephone Encounter (Signed)
Ok for order?  

## 2014-11-03 NOTE — Telephone Encounter (Signed)
yes

## 2014-11-03 NOTE — Telephone Encounter (Signed)
Don, Warden/ranger, stated Evaluation done, he need a order for one week for two weeks, equipment training, Transfer, Diabetes foot care. Please advise phone # [phone]

## 2014-11-04 DIAGNOSIS — R269 Unspecified abnormalities of gait and mobility: Secondary | ICD-10-CM | POA: Diagnosis not present

## 2014-11-04 DIAGNOSIS — Z8701 Personal history of pneumonia (recurrent): Secondary | ICD-10-CM | POA: Diagnosis not present

## 2014-11-04 NOTE — Telephone Encounter (Signed)
Notified Don with md response...Nathan Blackwell

## 2014-11-05 ENCOUNTER — Ambulatory Visit: Payer: Medicare Other | Admitting: Internal Medicine

## 2014-11-05 DIAGNOSIS — Z8701 Personal history of pneumonia (recurrent): Secondary | ICD-10-CM | POA: Diagnosis not present

## 2014-11-05 DIAGNOSIS — R269 Unspecified abnormalities of gait and mobility: Secondary | ICD-10-CM | POA: Diagnosis not present

## 2014-11-07 DIAGNOSIS — R269 Unspecified abnormalities of gait and mobility: Secondary | ICD-10-CM | POA: Diagnosis not present

## 2014-11-07 DIAGNOSIS — Z8701 Personal history of pneumonia (recurrent): Secondary | ICD-10-CM | POA: Diagnosis not present

## 2014-11-09 DIAGNOSIS — R269 Unspecified abnormalities of gait and mobility: Secondary | ICD-10-CM | POA: Diagnosis not present

## 2014-11-09 DIAGNOSIS — Z8701 Personal history of pneumonia (recurrent): Secondary | ICD-10-CM | POA: Diagnosis not present

## 2014-11-11 DIAGNOSIS — Z8701 Personal history of pneumonia (recurrent): Secondary | ICD-10-CM | POA: Diagnosis not present

## 2014-11-11 DIAGNOSIS — R269 Unspecified abnormalities of gait and mobility: Secondary | ICD-10-CM | POA: Diagnosis not present

## 2014-11-12 DIAGNOSIS — Z8701 Personal history of pneumonia (recurrent): Secondary | ICD-10-CM | POA: Diagnosis not present

## 2014-11-12 DIAGNOSIS — R269 Unspecified abnormalities of gait and mobility: Secondary | ICD-10-CM | POA: Diagnosis not present

## 2014-11-16 DIAGNOSIS — R269 Unspecified abnormalities of gait and mobility: Secondary | ICD-10-CM | POA: Diagnosis not present

## 2014-11-16 DIAGNOSIS — Z8701 Personal history of pneumonia (recurrent): Secondary | ICD-10-CM | POA: Diagnosis not present

## 2014-11-19 DIAGNOSIS — Z8701 Personal history of pneumonia (recurrent): Secondary | ICD-10-CM | POA: Diagnosis not present

## 2014-11-19 DIAGNOSIS — R269 Unspecified abnormalities of gait and mobility: Secondary | ICD-10-CM | POA: Diagnosis not present

## 2014-11-20 DIAGNOSIS — Z8701 Personal history of pneumonia (recurrent): Secondary | ICD-10-CM | POA: Diagnosis not present

## 2014-11-20 DIAGNOSIS — R269 Unspecified abnormalities of gait and mobility: Secondary | ICD-10-CM | POA: Diagnosis not present

## 2014-11-24 ENCOUNTER — Telehealth: Payer: Self-pay | Admitting: Internal Medicine

## 2014-11-24 MED ORDER — SITAGLIPTIN PHOSPHATE 50 MG PO TABS: 50.0000 mg | ORAL_TABLET | ORAL | Status: DC

## 2014-11-24 NOTE — Telephone Encounter (Signed)
Pt's wife called requesting refill for sitaGLIPtin (JANUVIA) 50 MG tablet [[id number] Pharmacy is [facility] on [address]

## 2014-12-02 DIAGNOSIS — L602 Onychogryphosis: Secondary | ICD-10-CM | POA: Diagnosis not present

## 2014-12-02 DIAGNOSIS — E1151 Type 2 diabetes mellitus with diabetic peripheral angiopathy without gangrene: Secondary | ICD-10-CM | POA: Diagnosis not present

## 2014-12-02 DIAGNOSIS — L84 Corns and callosities: Secondary | ICD-10-CM | POA: Diagnosis not present

## 2014-12-29 ENCOUNTER — Ambulatory Visit (INDEPENDENT_AMBULATORY_CARE_PROVIDER_SITE_OTHER): Payer: Medicare Other | Admitting: Internal Medicine

## 2014-12-29 ENCOUNTER — Encounter: Payer: Self-pay | Admitting: Internal Medicine

## 2014-12-29 VITALS — BP 190/86 | HR 70 | Temp 98.8°F | Resp 18 | Wt 165.0 lb

## 2014-12-29 DIAGNOSIS — I5032 Chronic diastolic (congestive) heart failure: Secondary | ICD-10-CM

## 2014-12-29 DIAGNOSIS — M549 Dorsalgia, unspecified: Secondary | ICD-10-CM | POA: Diagnosis not present

## 2014-12-29 DIAGNOSIS — I10 Essential (primary) hypertension: Secondary | ICD-10-CM | POA: Diagnosis not present

## 2014-12-29 LAB — POCT URINALYSIS DIPSTICK
Bilirubin, UA: NEGATIVE
Blood, UA: NEGATIVE
GLUCOSE UA: NEGATIVE
KETONES UA: NEGATIVE
Leukocytes, UA: NEGATIVE
Nitrite, UA: NEGATIVE
SPEC GRAV UA: 1.015
UROBILINOGEN UA: 0.2
pH, UA: 6

## 2014-12-29 MED ORDER — TIZANIDINE HCL 4 MG PO TABS: 4.0000 mg | ORAL_TABLET | Freq: Four times a day (QID) | ORAL | Status: DC | PRN

## 2014-12-29 NOTE — Assessment & Plan Note (Signed)
Elevated today, usually well controlled, likely reactive due to pain, declines referral to ER, o/w asympt and stable overall by history and exam, recent data reviewed with pt, and pt to continue medical treatment as before,  to f/u any worsening symptoms or concerns BP Readings from Last 3 Encounters:  12/29/14 190/86  10/29/14 110/52  10/22/14 154/74

## 2014-12-29 NOTE — Patient Instructions (Addendum)
You had the flu shot today  Please take all new medication as prescribed - the muscle relaxer as needed  Please continue all other medications as before, and refills have been done if requested.  Please have the pharmacy call with any other refills you may need.  Please keep your appointments with your specialists as you may have planned

## 2014-12-29 NOTE — Progress Notes (Signed)
Subjective:    Patient ID: Nathan Blackwell, male    DOB: 26-Sep-1920, 81 y.o.   MRN: [id number]  HPI  Here with recent episode dry heaves as he has recurrently several days ago, now with bilat right > left lower back pain.  No fever, Denies urinary symptoms such as dysuria, frequency, urgency, flank pain, hematuria or n/v, fever, chills, but is concerned this might be somehow urinary related.  Pt continues to have recurring LBP without bowel or bladder change, fever, wt loss,  worsening LE pain/numbness/weakness, gait change or falls. Pt denies chest pain, increased sob or doe, wheezing, orthopnea, PND, increased LE swelling, palpitations, dizziness or syncope. Past Medical History  Diagnosis Date  . Diabetes mellitus type II   . Hyperlipidemia   . Hypertension   . Osteoporosis   . Prostatic hypertrophy     benign  . OSA (obstructive sleep apnea)   . Rotator cuff syndrome     chronic  . Elevated PSA   . Hypothyroidism   . Nephrolithiasis   . Thyroid nodule   . GERD (gastroesophageal reflux disease)   . Colitis, Clostridium difficile     presumed 11/08  . Dementia   . Anemia     NOS  . Vitamin B 12 deficiency   . BRADYCARDIA 06/10/2009  . COLON CANCER, HX OF 09/20/2006  . HYPOTHYROIDISM 01/14/2007  . DIABETES MELLITUS, TYPE II 09/20/2006  . HYPERLIPIDEMIA 09/20/2006  . ANEMIA-NOS 06/23/2008  . DEMENTIA 05/14/2007  . DEPRESSION 02/27/2007  . SLEEP APNEA, OBSTRUCTIVE 01/14/2007  . HYPERTENSION 09/20/2006  . GERD 01/14/2007  . OSTEOPOROSIS 10/08/2006  . PERIPHERAL EDEMA 09/03/2007  . RENAL INSUFFICIENCY 04/01/2010  . B12 deficiency 05/25/2010  . CLOSTRIDIUM DIFFICILE COLITIS 01/30/2007  . THYROID NODULE 01/14/2007  . CONSTIPATION, RECURRENT 01/06/2010  . BENIGN PROSTATIC HYPERTROPHY 10/08/2006  . ARTHRITIS 02/23/2007  . BACK PAIN, THORACIC REGION 06/04/2008  . COLONIC POLYPS, HX OF 10/08/2006  . NEPHROLITHIASIS, HX OF 01/14/2007  . Cancer of colon ([place]) dx'd 1998  . Cancer of vocal cord  ([place]) dx'd 1998  . 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 DVT of lower limb, acute ([place]) 01/12/2011  . Sleep apnea     CPAP dependent  . Diastolic congestive heart failure ([place])   . Hypothyroidism   . Diabetes mellitus [facility])    Past Surgical History  Procedure Laterality Date  . Cholecystectomy  1999  . Inguinal herniorrhapy  1984  . Appendectomy  1998  . Colon resection  1998  . Skin cancer extraction      right ear-extensive scar  . Cataract extraction, bilateral    . Nm lexiscan myoview ltd  09/02/2013    Normal LV function and wall motion. EF 62%; rate dependent LBBB with Lexiscan, Low Risk fixed inferior defect suggestive of diaphragmatic attenuation and not infarct with normal wall motion.  . Transthoracic echocardiogram  08/03/2009    Normal LV size. Mild LVH. EF 60-65%. Gr 2 DD, mild MR.  . Esophagogastroduodenoscopy (egd) with propofol N/A 12/19/2013    Procedure: ESOPHAGOGASTRODUODENOSCOPY (EGD) WITH PROPOFOL;  Surgeon: Inda Castle, MD;  Location: [facility];  Service: Endoscopy;  Laterality: N/A;  . Savory dilation N/A 12/19/2013    Procedure: SAVORY DILATION;  Surgeon: Inda Castle, MD;  Location: [facility];  Service: Endoscopy;  Laterality: N/A;  With Fluoroscopy  . Esophagogastroduodenoscopy N/A 12/30/2013    Procedure: ESOPHAGOGASTRODUODENOSCOPY (EGD);  Surgeon: Inda Castle, MD;  Location: [facility];  Service: Endoscopy;  Laterality:  N/A;  Azzie Almas dilation N/A 12/30/2013    Procedure: SAVORY DILATION;  Surgeon: Inda Castle, MD;  Location: [facility];  Service: Endoscopy;  Laterality: N/A;    reports that he has never smoked. He has never used smokeless tobacco. He reports that he does not drink alcohol or use illicit drugs. family history includes Diabetes in his mother; Stroke in his father. Allergies  Allergen Reactions  . Biaxin [Clarithromycin] Other (See Comments)    Unknown   . Other Other (See Comments)    Nephrologist has told  patient not to eat many different foods (tomatoes, Green Foods, etc).   . Sulfa Antibiotics Other (See Comments)    Unknown    Current Outpatient Prescriptions on File Prior to Visit  Medication Sig Dispense Refill  . acetaminophen (TYLENOL) 325 MG tablet Take 325 mg by mouth every 6 (six) hours as needed for mild pain, moderate pain or headache.    . bisacodyl (DULCOLAX) 10 MG suppository Place 1 suppository (10 mg total) rectally daily as needed for moderate constipation. 12 suppository 0  . cholecalciferol (VITAMIN D) 1000 UNITS tablet Take 1,000 Units by mouth every morning.     . citalopram (CELEXA) 20 MG tablet Take 1 tablet (20 mg total) by mouth every morning. 30 tablet 0  . famotidine (PEPCID) 20 MG tablet Take 1 tablet (20 mg total) by mouth daily. 30 tablet 0  . feeding supplement, ENSURE ENLIVE, (ENSURE ENLIVE) LIQD Take 237 mLs by mouth 2 (two) times daily between meals. 237 mL 12  . glucose blood test strip Use as directed once daily to check blood sugar.  Diagnosis code E11.9 300 each 3  . hydrALAZINE (APRESOLINE) 25 MG tablet Take 1 tablet (25 mg total) by mouth 3 (three) times daily. 90 tablet 0  . isosorbide mononitrate (IMDUR) 30 MG 24 hr tablet Take 1 tablet (30 mg total) by mouth every morning. 90 tablet 3  . lansoprazole (PREVACID) 30 MG capsule Take 1 capsule (30 mg total) by mouth every morning. 90 capsule 3  . levothyroxine (SYNTHROID, LEVOTHROID) 100 MCG tablet Take 1 tablet (100 mcg total) by mouth daily before breakfast. 90 tablet 3  . lipase/protease/amylase (CREON) 12000 UNITS CPEP capsule Take 1 capsule (12,000 Units total) by mouth 3 (three) times daily with meals. 270 capsule 3  . OLANZapine (ZYPREXA) 5 MG tablet Take 1 tablet (5 mg total) by mouth every morning. 30 tablet 0  . potassium chloride (K-DUR) 10 MEQ tablet Take 1 tablet (10 mEq total) by mouth daily. 30 tablet 0  . QUEtiapine (SEROQUEL) 50 MG tablet TAKE 1 TABLET (50 MG TOTAL) BY MOUTH AT BEDTIME.  (Patient taking differently: Take 50 mg by mouth at bedtime. ) 30 tablet 0  . sitaGLIPtin (JANUVIA) 50 MG tablet Take 1 tablet (50 mg total) by mouth every morning. 90 tablet 1  . tamsulosin (FLOMAX) 0.4 MG CAPS capsule Take 1 capsule (0.4 mg total) by mouth at bedtime. 90 capsule 3  . aspirin EC 81 MG EC tablet Take 1 tablet (81 mg total) by mouth daily. (Patient not taking: Reported on 12/29/2014) 30 tablet 0   No current facility-administered medications on file prior to visit.   Review of Systems  Constitutional: Negative for unusual diaphoresis or night sweats HENT: Negative for ringing in ear or discharge Eyes: Negative for double vision or worsening visual disturbance.  Respiratory: Negative for choking and stridor.   Gastrointestinal: Negative for vomiting or other signifcant bowel change Genitourinary: Negative  for hematuria or change in urine volume.  Musculoskeletal: Negative for other MSK pain or swelling Skin: Negative for color change and worsening wound.  Neurological: Negative for tremors and numbness other than noted  Psychiatric/Behavioral: Negative for decreased concentration or agitation other than above       Objective:   Physical Exam BP 190/86 mmHg  Pulse 70  Temp(Src) 98.8 F (37.1 C) (Oral)  Resp 18  Wt 165 lb (74.844 kg)  SpO2 98% VS noted,  Constitutional: Pt appears in no significant distress HENT: Head: NCAT.  Right Ear: External ear normal.  Left Ear: External ear normal.  Eyes: . Pupils are equal, round, and reactive to light. Conjunctivae and EOM are normal Neck: Normal range of motion. Neck supple.  Cardiovascular: Normal rate and regular rhythm.   Pulmonary/Chest: Effort normal and breath sounds without rales or wheezing.  Abd:  Soft, NT, ND, + BS Neurological: Pt is alert. Not confused , motor grossly intact Skin: Skin is warm. No rash, no LE edema Psychiatric: Pt behavior is normal. No agitation.  Spine nontender, no flank tender bilat +  bilat lumbar paravertebral tender/spasm  POCT urinalysis dipstick  Status: Finalresult Visible to patient:  Not Released Dx:  Bilateral back pain, unspecified loca...           Ref Range 7:13 PM  54mo ago  1mo ago     Color, UA  yellow      Clarity, UA  clear      Glucose, UA  neg 100 (A) 250 (A)    Bilirubin, UA  neg      Ketones, UA  neg      Spec Grav, UA  1.015      Blood, UA  neg      pH, UA  6.0      Protein, UA  30mg /dl      Urobilinogen, UA  0.2 0.2 0.2    Nitrite, UA  neg      Leukocytes, UA Negative  Negative NEGATIVER NEGATIVER   Resulting Agency                Assessment & Plan:

## 2014-12-29 NOTE — Progress Notes (Signed)
Pre visit review using our clinic review tool, if applicable. No additional management support is needed unless otherwise documented below in the visit note. 

## 2014-12-29 NOTE — Assessment & Plan Note (Signed)
Exam c/w msk spasm/strain, for muscle relaxer prn  to f/u any worsening symptoms or concerns

## 2014-12-29 NOTE — Assessment & Plan Note (Signed)
stable overall by history and exam, recent data reviewed with pt, and pt to continue medical treatment as before,  to f/u any worsening symptoms or concerns Lab Results  Component Value Date   WBC 8.6 10/21/2014   HGB 10.2* 10/21/2014   HCT 32.3* 10/21/2014   PLT 194 10/21/2014   GLUCOSE 119* 10/29/2014   CHOL 208* 05/05/2014   TRIG 268.0* 05/05/2014   HDL 40.30 05/05/2014   LDLDIRECT 139.0 05/05/2014   LDLCALC 92 09/21/2011   ALT 14* 10/20/2014   AST 23 10/20/2014   NA 140 10/29/2014   K 3.5 10/29/2014   CL 101 10/29/2014   CREATININE 1.81* 10/29/2014   BUN 32* 10/29/2014   CO2 31 10/29/2014   TSH 3.134 10/20/2014   PSA 4.88* 01/14/2007   INR 1.12 08/18/2014   HGBA1C 6.9* 10/20/2014   MICROALBUR 6.3* 05/05/2014

## 2015-01-11 ENCOUNTER — Other Ambulatory Visit: Payer: Self-pay | Admitting: Internal Medicine

## 2015-01-27 ENCOUNTER — Ambulatory Visit: Payer: Medicare Other | Admitting: Internal Medicine

## 2015-02-03 DIAGNOSIS — E1351 Other specified diabetes mellitus with diabetic peripheral angiopathy without gangrene: Secondary | ICD-10-CM | POA: Diagnosis not present

## 2015-02-03 DIAGNOSIS — L84 Corns and callosities: Secondary | ICD-10-CM | POA: Diagnosis not present

## 2015-02-03 DIAGNOSIS — I70293 Other atherosclerosis of native arteries of extremities, bilateral legs: Secondary | ICD-10-CM | POA: Diagnosis not present

## 2015-02-03 DIAGNOSIS — L602 Onychogryphosis: Secondary | ICD-10-CM | POA: Diagnosis not present

## 2015-02-15 ENCOUNTER — Ambulatory Visit: Payer: Medicare Other | Admitting: Internal Medicine

## 2015-02-18 ENCOUNTER — Ambulatory Visit: Payer: Medicare Other | Admitting: Internal Medicine

## 2015-02-19 ENCOUNTER — Ambulatory Visit (INDEPENDENT_AMBULATORY_CARE_PROVIDER_SITE_OTHER)
Admission: RE | Admit: 2015-02-19 | Discharge: 2015-02-19 | Disposition: A | Discharge status: Home / Self Care | Payer: Medicare Other | Source: Ambulatory Visit | Attending: Internal Medicine | Admitting: Internal Medicine

## 2015-02-19 ENCOUNTER — Ambulatory Visit (INDEPENDENT_AMBULATORY_CARE_PROVIDER_SITE_OTHER): Payer: Medicare Other | Admitting: Internal Medicine

## 2015-02-19 ENCOUNTER — Other Ambulatory Visit: Payer: Self-pay | Admitting: Internal Medicine

## 2015-02-19 ENCOUNTER — Encounter: Payer: Self-pay | Admitting: Internal Medicine

## 2015-02-19 VITALS — BP 126/80 | HR 61 | Temp 98.6°F | Resp 20 | Ht 69.0 in | Wt 162.8 lb

## 2015-02-19 DIAGNOSIS — M419 Scoliosis, unspecified: Secondary | ICD-10-CM | POA: Diagnosis not present

## 2015-02-19 DIAGNOSIS — M546 Pain in thoracic spine: Secondary | ICD-10-CM

## 2015-02-19 MED ORDER — MELOXICAM 7.5 MG PO TABS: 7.5000 mg | ORAL_TABLET | Freq: Every day | ORAL | Status: DC

## 2015-02-19 NOTE — Progress Notes (Signed)
   Subjective:    Patient ID: Nathan Blackwell, male    DOB: 11-12-1920, 81 y.o.   MRN: [id number]  HPI  He describes sharp low back pain for the last 2 weeks without specific trigger or injury. It's a 10 on a 10 scale and essentially constant. It is worse with any mobilization or change in position. It is also worse if he coughs. It is nonradiating. It is unassociated with other neuromuscular symptoms.  He states he fell yesterday and does not know why. There was no definite cardiac or neurologic prodrome.The back pain preceded the fall.  Review of Systems  There is no significant cough, sputum production,hemoptysis, wheezing,or  paroxysmal nocturnal dyspnea. Unexplained weight loss, abdominal pain, significant dyspepsia, dysphagia, melena, rectal bleeding, or persistently small caliber stools are not present. Dysuria, pyuria, hematuria, frequency, nocturia or polyuria are denied. There is no numbness, tingling, or weakness in extremities.   No loss of control of bladder or bowels. Radicular type pain absent. No seizure stigmata. He's had no associated rash.     Objective:   Physical Exam Pertinent or positive findings include: He has bilateral ptosis. Hearing aids present bilaterally; but hearing still impaired. He appears somewhat chronically ill. Pedal pulses are decreased. He has 1/2+ pedal edema. He ambulates slowly and deliberately with a shuffling gait using a  rolling walker. He is localizing the pain in the lower thoracic area rather than the lumbosacral area.  General appearance :adequately nourished; in no distress.  Eyes: No conjunctival inflammation or scleral icterus is present.  Oral exam:  Lips and gums are healthy appearing.No cyanosis present.  Heart:  Normal rate and regular rhythm. S1 and S2 normal without gallop, murmur, click, rub or other extra sounds    Lungs:Chest clear to auscultation; no wheezes, rhonchi,rales ,or rubs present.No increased work of breathing.    Abdomen: bowel sounds normal, soft and non-tender without masses, organomegaly or hernias noted.  No guarding or rebound. No flank tenderness to percussion.  Vascular : all pulses equal ; no bruits present.  Skin:Warm & dry.  Intact without suspicious lesions or rashes ; no tenting or jaundice   Lymphatic: No lymphadenopathy is noted about the head, neck, axilla   Neuro: Strength, tone profoundly decreased.    Assessment & Plan:  #1 thoracic spine pain; R/O vertebral fracture  Plan: See orders and recommendations

## 2015-02-19 NOTE — Progress Notes (Signed)
Pre visit review using our clinic review tool, if applicable. No additional management support is needed unless otherwise documented below in the visit note. 

## 2015-02-19 NOTE — Patient Instructions (Signed)
  Your next office appointment will be determined based upon review of your pending  xrays  Those written interpretation of the lab results and instructions will be transmitted to you by mail for your records.  Critical results will be called.   Followup as needed for any active or acute issue. Please report any significant change in your symptoms. 

## 2015-02-22 ENCOUNTER — Telehealth: Payer: Self-pay | Admitting: *Deleted

## 2015-02-22 NOTE — Telephone Encounter (Signed)
Already been done...Nathan Blackwell

## 2015-02-22 NOTE — Telephone Encounter (Signed)
-----   Message from Hendricks Limes, MD sent at 02/19/2015  5:30 PM EST ----- Not my patient; he sees Dr Jenny Reichmann Please have PCP name changed; thanks

## 2015-03-04 ENCOUNTER — Other Ambulatory Visit: Payer: Self-pay | Admitting: Internal Medicine

## 2015-03-04 NOTE — Telephone Encounter (Signed)
Saw [facility] on 02/19/15. RX was sent in at appt.

## 2015-03-15 ENCOUNTER — Other Ambulatory Visit: Payer: Self-pay | Admitting: Internal Medicine

## 2015-03-17 ENCOUNTER — Other Ambulatory Visit: Payer: Self-pay | Admitting: Internal Medicine

## 2015-03-22 ENCOUNTER — Other Ambulatory Visit: Payer: Self-pay | Admitting: Internal Medicine

## 2015-03-25 DIAGNOSIS — N2581 Secondary hyperparathyroidism of renal origin: Secondary | ICD-10-CM | POA: Diagnosis not present

## 2015-03-25 DIAGNOSIS — N189 Chronic kidney disease, unspecified: Secondary | ICD-10-CM | POA: Diagnosis not present

## 2015-03-25 DIAGNOSIS — N184 Chronic kidney disease, stage 4 (severe): Secondary | ICD-10-CM | POA: Diagnosis not present

## 2015-04-01 DIAGNOSIS — N184 Chronic kidney disease, stage 4 (severe): Secondary | ICD-10-CM | POA: Diagnosis not present

## 2015-04-01 DIAGNOSIS — D631 Anemia in chronic kidney disease: Secondary | ICD-10-CM | POA: Diagnosis not present

## 2015-04-01 DIAGNOSIS — I10 Essential (primary) hypertension: Secondary | ICD-10-CM | POA: Diagnosis not present

## 2015-04-01 DIAGNOSIS — N2581 Secondary hyperparathyroidism of renal origin: Secondary | ICD-10-CM | POA: Diagnosis not present

## 2015-04-05 ENCOUNTER — Other Ambulatory Visit: Payer: Self-pay | Admitting: Internal Medicine

## 2015-04-06 DIAGNOSIS — I70293 Other atherosclerosis of native arteries of extremities, bilateral legs: Secondary | ICD-10-CM | POA: Diagnosis not present

## 2015-04-06 DIAGNOSIS — L84 Corns and callosities: Secondary | ICD-10-CM | POA: Diagnosis not present

## 2015-04-06 DIAGNOSIS — E1351 Other specified diabetes mellitus with diabetic peripheral angiopathy without gangrene: Secondary | ICD-10-CM | POA: Diagnosis not present

## 2015-04-06 DIAGNOSIS — L602 Onychogryphosis: Secondary | ICD-10-CM | POA: Diagnosis not present

## 2015-04-12 ENCOUNTER — Encounter: Payer: Self-pay | Admitting: Internal Medicine

## 2015-04-12 ENCOUNTER — Ambulatory Visit (INDEPENDENT_AMBULATORY_CARE_PROVIDER_SITE_OTHER): Payer: Medicare Other | Admitting: Internal Medicine

## 2015-04-12 VITALS — BP 164/82 | HR 66 | Temp 97.8°F | Resp 18 | Wt 158.0 lb

## 2015-04-12 DIAGNOSIS — R110 Nausea: Secondary | ICD-10-CM

## 2015-04-12 DIAGNOSIS — R42 Dizziness and giddiness: Secondary | ICD-10-CM

## 2015-04-12 MED ORDER — ONDANSETRON HCL 4 MG PO TABS: 4.0000 mg | ORAL_TABLET | Freq: Three times a day (TID) | ORAL | Status: DC | PRN

## 2015-04-12 MED ORDER — MECLIZINE HCL 12.5 MG PO TABS: 12.5000 mg | ORAL_TABLET | Freq: Three times a day (TID) | ORAL | Status: DC | PRN

## 2015-04-12 NOTE — Patient Instructions (Signed)
  I have sent an anti-nausea medication and more meclizine to your pharmacy.    Your prescription(s) have been submitted to your pharmacy. Please take as directed and contact our office if you believe you are having problem(s) with the medication(s).   Please call if you are not feeling any better or feel worse in the next 24 hours.

## 2015-04-12 NOTE — Progress Notes (Signed)
Pre visit review using our clinic review tool, if applicable. No additional management support is needed unless otherwise documented below in the visit note. 
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
## 2015-04-16 DIAGNOSIS — L89892 Pressure ulcer of other site, stage 2: Secondary | ICD-10-CM | POA: Diagnosis not present

## 2015-04-16 DIAGNOSIS — L820 Inflamed seborrheic keratosis: Secondary | ICD-10-CM | POA: Diagnosis not present

## 2015-04-16 DIAGNOSIS — Z23 Encounter for immunization: Secondary | ICD-10-CM | POA: Diagnosis not present

## 2015-04-16 DIAGNOSIS — D485 Neoplasm of uncertain behavior of skin: Secondary | ICD-10-CM | POA: Diagnosis not present

## 2015-04-16 DIAGNOSIS — L570 Actinic keratosis: Secondary | ICD-10-CM | POA: Diagnosis not present

## 2015-04-16 DIAGNOSIS — Z85828 Personal history of other malignant neoplasm of skin: Secondary | ICD-10-CM | POA: Diagnosis not present

## 2015-04-16 DIAGNOSIS — L821 Other seborrheic keratosis: Secondary | ICD-10-CM | POA: Diagnosis not present

## 2015-04-16 DIAGNOSIS — Z86018 Personal history of other benign neoplasm: Secondary | ICD-10-CM | POA: Diagnosis not present

## 2015-05-03 ENCOUNTER — Other Ambulatory Visit: Payer: Self-pay | Admitting: Internal Medicine

## 2015-05-07 ENCOUNTER — Encounter: Payer: Self-pay | Admitting: Internal Medicine

## 2015-05-10 ENCOUNTER — Other Ambulatory Visit: Payer: Self-pay | Admitting: Internal Medicine

## 2015-05-17 ENCOUNTER — Other Ambulatory Visit: Payer: Self-pay | Admitting: Internal Medicine

## 2015-05-21 ENCOUNTER — Other Ambulatory Visit: Payer: Self-pay | Admitting: Internal Medicine

## 2015-05-24 ENCOUNTER — Other Ambulatory Visit: Payer: Self-pay | Admitting: Internal Medicine

## 2015-06-05 ENCOUNTER — Other Ambulatory Visit: Payer: Self-pay | Admitting: Internal Medicine

## 2015-06-08 DIAGNOSIS — L602 Onychogryphosis: Secondary | ICD-10-CM | POA: Diagnosis not present

## 2015-06-08 DIAGNOSIS — L84 Corns and callosities: Secondary | ICD-10-CM | POA: Diagnosis not present

## 2015-06-08 DIAGNOSIS — I70293 Other atherosclerosis of native arteries of extremities, bilateral legs: Secondary | ICD-10-CM | POA: Diagnosis not present

## 2015-06-08 DIAGNOSIS — E1351 Other specified diabetes mellitus with diabetic peripheral angiopathy without gangrene: Secondary | ICD-10-CM | POA: Diagnosis not present

## 2015-06-24 ENCOUNTER — Encounter (HOSPITAL_COMMUNITY): Payer: Self-pay

## 2015-06-24 ENCOUNTER — Emergency Department (HOSPITAL_COMMUNITY)
Admission: EM | Admit: 2015-06-24 | Discharge: 2015-06-24 | Disposition: A | Discharge status: Home / Self Care | Payer: Medicare Other | Attending: Emergency Medicine | Admitting: Emergency Medicine

## 2015-06-24 ENCOUNTER — Emergency Department (HOSPITAL_COMMUNITY): Payer: Medicare Other

## 2015-06-24 DIAGNOSIS — Z85038 Personal history of other malignant neoplasm of large intestine: Secondary | ICD-10-CM | POA: Diagnosis not present

## 2015-06-24 DIAGNOSIS — I82409 Acute embolism and thrombosis of unspecified deep veins of unspecified lower extremity: Secondary | ICD-10-CM | POA: Insufficient documentation

## 2015-06-24 DIAGNOSIS — N400 Enlarged prostate without lower urinary tract symptoms: Secondary | ICD-10-CM | POA: Insufficient documentation

## 2015-06-24 DIAGNOSIS — R109 Unspecified abdominal pain: Secondary | ICD-10-CM

## 2015-06-24 DIAGNOSIS — E785 Hyperlipidemia, unspecified: Secondary | ICD-10-CM | POA: Insufficient documentation

## 2015-06-24 DIAGNOSIS — E538 Deficiency of other specified B group vitamins: Secondary | ICD-10-CM | POA: Diagnosis not present

## 2015-06-24 DIAGNOSIS — Z79899 Other long term (current) drug therapy: Secondary | ICD-10-CM | POA: Insufficient documentation

## 2015-06-24 DIAGNOSIS — Z791 Long term (current) use of non-steroidal anti-inflammatories (NSAID): Secondary | ICD-10-CM | POA: Insufficient documentation

## 2015-06-24 DIAGNOSIS — G4733 Obstructive sleep apnea (adult) (pediatric): Secondary | ICD-10-CM | POA: Insufficient documentation

## 2015-06-24 DIAGNOSIS — E039 Hypothyroidism, unspecified: Secondary | ICD-10-CM | POA: Insufficient documentation

## 2015-06-24 DIAGNOSIS — Z7982 Long term (current) use of aspirin: Secondary | ICD-10-CM | POA: Insufficient documentation

## 2015-06-24 DIAGNOSIS — R519 Headache, unspecified: Secondary | ICD-10-CM

## 2015-06-24 DIAGNOSIS — M1990 Unspecified osteoarthritis, unspecified site: Secondary | ICD-10-CM | POA: Insufficient documentation

## 2015-06-24 DIAGNOSIS — I10 Essential (primary) hypertension: Secondary | ICD-10-CM | POA: Diagnosis not present

## 2015-06-24 DIAGNOSIS — N281 Cyst of kidney, acquired: Secondary | ICD-10-CM | POA: Diagnosis not present

## 2015-06-24 DIAGNOSIS — E119 Type 2 diabetes mellitus without complications: Secondary | ICD-10-CM | POA: Diagnosis not present

## 2015-06-24 DIAGNOSIS — Z7984 Long term (current) use of oral hypoglycemic drugs: Secondary | ICD-10-CM | POA: Diagnosis not present

## 2015-06-24 DIAGNOSIS — F0390 Unspecified dementia without behavioral disturbance: Secondary | ICD-10-CM | POA: Insufficient documentation

## 2015-06-24 DIAGNOSIS — F329 Major depressive disorder, single episode, unspecified: Secondary | ICD-10-CM | POA: Diagnosis not present

## 2015-06-24 DIAGNOSIS — R112 Nausea with vomiting, unspecified: Secondary | ICD-10-CM | POA: Diagnosis present

## 2015-06-24 DIAGNOSIS — K219 Gastro-esophageal reflux disease without esophagitis: Secondary | ICD-10-CM | POA: Diagnosis not present

## 2015-06-24 DIAGNOSIS — I509 Heart failure, unspecified: Secondary | ICD-10-CM | POA: Diagnosis not present

## 2015-06-24 DIAGNOSIS — R51 Headache: Secondary | ICD-10-CM | POA: Insufficient documentation

## 2015-06-24 LAB — COMPREHENSIVE METABOLIC PANEL
ALT: 17 U/L (ref 17–63)
ANION GAP: 15 (ref 5–15)
AST: 27 U/L (ref 15–41)
Albumin: 4.6 g/dL (ref 3.5–5.0)
Alkaline Phosphatase: 92 U/L (ref 38–126)
BILIRUBIN TOTAL: 1.1 mg/dL (ref 0.3–1.2)
BUN: 29 mg/dL — AB (ref 6–20)
CO2: 27 mmol/L (ref 22–32)
Calcium: 9.4 mg/dL (ref 8.9–10.3)
Chloride: 100 mmol/L — ABNORMAL LOW (ref 101–111)
Creatinine, Ser: 1.66 mg/dL — ABNORMAL HIGH (ref 0.61–1.24)
GFR, EST AFRICAN AMERICAN: 40 mL/min — AB (ref >60)
GFR, EST NON AFRICAN AMERICAN: 34 mL/min — AB (ref >60)
Glucose, Bld: 213 mg/dL — ABNORMAL HIGH (ref 65–99)
POTASSIUM: 3.9 mmol/L (ref 3.5–5.1)
Sodium: 142 mmol/L (ref 135–145)
TOTAL PROTEIN: 8.3 g/dL — AB (ref 6.5–8.1)

## 2015-06-24 LAB — URINE MICROSCOPIC-ADD ON

## 2015-06-24 LAB — URINALYSIS, ROUTINE W REFLEX MICROSCOPIC
Bilirubin Urine: NEGATIVE
Glucose, UA: 100 mg/dL — AB
Hgb urine dipstick: NEGATIVE
KETONES UR: NEGATIVE mg/dL
LEUKOCYTES UA: NEGATIVE
NITRITE: NEGATIVE
PH: 7.5 (ref 5.0–8.0)
Specific Gravity, Urine: 1.014 (ref 1.005–1.030)

## 2015-06-24 LAB — CBC
HEMATOCRIT: 40.1 % (ref 39.0–52.0)
HEMOGLOBIN: 13.9 g/dL (ref 13.0–17.0)
MCH: 32.3 pg (ref 26.0–34.0)
MCHC: 34.7 g/dL (ref 30.0–36.0)
MCV: 93.3 fL (ref 78.0–100.0)
Platelets: 190 10*3/uL (ref 150–400)
RBC: 4.3 MIL/uL (ref 4.22–5.81)
RDW: 13.2 % (ref 11.5–15.5)
WBC: 10.5 10*3/uL (ref 4.0–10.5)

## 2015-06-24 LAB — LIPASE, BLOOD: Lipase: 30 U/L (ref 11–51)

## 2015-06-24 LAB — I-STAT TROPONIN, ED: Troponin i, poc: 0.04 ng/mL (ref 0.00–0.08)

## 2015-06-24 MED ORDER — HYDRALAZINE HCL 20 MG/ML IJ SOLN: 10.0000 mg | Freq: Once | INTRAMUSCULAR | Status: DC

## 2015-06-24 MED ORDER — ONDANSETRON HCL 4 MG/2ML IJ SOLN
4.0000 mg | Freq: Once | INTRAMUSCULAR | Status: AC
Start: 2015-06-24 — End: 2015-06-24
  Administered 2015-06-24: 4 mg via INTRAVENOUS
  Filled 2015-06-24: qty 2

## 2015-06-24 MED ORDER — ACETAMINOPHEN 325 MG PO TABS
650.0000 mg | ORAL_TABLET | Freq: Once | ORAL | Status: AC
  Administered 2015-06-24: 650 mg via ORAL
  Filled 2015-06-24: qty 2

## 2015-06-24 MED ORDER — ONDANSETRON 4 MG PO TBDP: ORAL_TABLET | ORAL | Status: DC

## 2015-06-24 MED ORDER — MORPHINE SULFATE (PF) 2 MG/ML IV SOLN
2.0000 mg | Freq: Once | INTRAVENOUS | Status: AC
  Administered 2015-06-24: 2 mg via INTRAVENOUS
  Filled 2015-06-24: qty 1

## 2015-06-24 MED ORDER — SODIUM CHLORIDE 0.9 % IV BOLUS (SEPSIS)
500.0000 mL | Freq: Once | INTRAVENOUS | Status: AC
  Administered 2015-06-24: 500 mL via INTRAVENOUS

## 2015-06-24 NOTE — ED Notes (Signed)
Patient c/o vomiting since last night. Patient states that he had 4 hard stools yesterday.

## 2015-06-24 NOTE — ED Notes (Signed)
Patient transported to CT 

## 2015-06-24 NOTE — ED Notes (Signed)
Bed: WA06 Expected date:  Expected time:  Means of arrival:  Comments: 

## 2015-06-24 NOTE — ED Provider Notes (Signed)
CSN: [id number]     Arrival date & time 06/24/15  0908 History   First MD Initiated Contact with Patient 06/24/15 1028     Chief Complaint  Patient presents with  . Emesis  . Headache     (Consider location/radiation/quality/duration/timing/severity/associated sxs/prior Treatment) HPI  82 year old male presents with a chief complaint of vomiting. Felt a vomiting last night around 8 PM. Vomited about 4 times. This morning he has been dry heaving. He has had abdominal pain that also started last night. This all started shortly after eating pizza. The abdominal pain is lower abdomen. Denies chest pain or shortness of breath. No urinary symptoms. No diarrhea. No blood in his stools. Patient states that very shortly after arrival about 1-1/2 hours ago he developed a frontal headache that has been progressively worsening. The headache is now an 8/10. No weakness or numbness. No blurry vision. He feels dizzy whenever he sits up or stands up but feels normal at rest.  Past Medical History  Diagnosis Date  . Diabetes mellitus type II   . Hyperlipidemia   . Hypertension   . Osteoporosis   . Prostatic hypertrophy     benign  . OSA (obstructive sleep apnea)   . Rotator cuff syndrome     chronic  . Elevated PSA   . Hypothyroidism   . Nephrolithiasis   . Thyroid nodule   . GERD (gastroesophageal reflux disease)   . Colitis, Clostridium difficile     presumed 11/08  . Dementia   . Anemia     NOS  . Vitamin B 12 deficiency   . BRADYCARDIA 06/10/2009  . COLON CANCER, HX OF 09/20/2006  . HYPOTHYROIDISM 01/14/2007  . DIABETES MELLITUS, TYPE II 09/20/2006  . HYPERLIPIDEMIA 09/20/2006  . ANEMIA-NOS 06/23/2008  . DEMENTIA 05/14/2007  . DEPRESSION 02/27/2007  . SLEEP APNEA, OBSTRUCTIVE 01/14/2007  . HYPERTENSION 09/20/2006  . GERD 01/14/2007  . OSTEOPOROSIS 10/08/2006  . PERIPHERAL EDEMA 09/03/2007  . RENAL INSUFFICIENCY 04/01/2010  . B12 deficiency 05/25/2010  . CLOSTRIDIUM DIFFICILE COLITIS  01/30/2007  . THYROID NODULE 01/14/2007  . CONSTIPATION, RECURRENT 01/06/2010  . BENIGN PROSTATIC HYPERTROPHY 10/08/2006  . ARTHRITIS 02/23/2007  . BACK PAIN, THORACIC REGION 06/04/2008  . COLONIC POLYPS, HX OF 10/08/2006  . NEPHROLITHIASIS, HX OF 01/14/2007  . Cancer of colon ([place]) dx'd 1998  . 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 DVT of lower limb, acute ([place]) 01/12/2011  . Sleep apnea     CPAP dependent  . Diastolic congestive heart failure ([place])   . Hypothyroidism   . Diabetes mellitus [facility])    Past Surgical History  Procedure Laterality Date  . Cholecystectomy  1999  . Inguinal herniorrhapy  1984  . Appendectomy  1998  . Colon resection  1998  . Skin cancer extraction      right ear-extensive scar  . Cataract extraction, bilateral    . Nm lexiscan myoview ltd  09/02/2013    Normal LV function and wall motion. EF 62%; rate dependent LBBB with Lexiscan, Low Risk fixed inferior defect suggestive of diaphragmatic attenuation and not infarct with normal wall motion.  . Transthoracic echocardiogram  08/03/2009    Normal LV size. Mild LVH. EF 60-65%. Gr 2 DD, mild MR.  . Esophagogastroduodenoscopy (egd) with propofol N/A 12/19/2013    Procedure: ESOPHAGOGASTRODUODENOSCOPY (EGD) WITH PROPOFOL;  Surgeon: Inda Castle, MD;  Location: [facility] ENDOSCOPY;  Service: Endoscopy;  Laterality: N/A;  . Savory dilation  N/A 12/19/2013    Procedure: SAVORY DILATION;  Surgeon: Inda Castle, MD;  Location: [facility];  Service: Endoscopy;  Laterality: N/A;  With Fluoroscopy  . Esophagogastroduodenoscopy N/A 12/30/2013    Procedure: ESOPHAGOGASTRODUODENOSCOPY (EGD);  Surgeon: Inda Castle, MD;  Location: [facility];  Service: Endoscopy;  Laterality: N/A;  . Savory dilation N/A 12/30/2013    Procedure: SAVORY DILATION;  Surgeon: Inda Castle, MD;  Location: [facility];  Service: Endoscopy;  Laterality: N/A;   Family History  Problem Relation Age of  Onset  . Diabetes Mother   . Stroke Father    Social History  Substance Use Topics  . Smoking status: Never Smoker   . Smokeless tobacco: Never Used  . Alcohol Use: No    Review of Systems  Constitutional: Negative for fever.  Eyes: Negative for visual disturbance.  Respiratory: Negative for shortness of breath.   Cardiovascular: Negative for chest pain.  Gastrointestinal: Positive for nausea, vomiting and abdominal pain. Negative for diarrhea.  Genitourinary: Negative for dysuria.  Neurological: Positive for dizziness and headaches. Negative for weakness and numbness.  All other systems reviewed and are negative.     Allergies  Biaxin; Other; and Sulfa antibiotics  Home Medications   Prior to Admission medications   Medication Sig Start Date End Date Taking? Authorizing Provider  acetaminophen (TYLENOL) 325 MG tablet Take 325 mg by mouth every 6 (six) hours as needed for mild pain, moderate pain or headache.    Historical Provider, MD  aspirin EC 81 MG EC tablet Take 1 tablet (81 mg total) by mouth daily. 10/21/14   Robbie Lis, MD  bisacodyl (DULCOLAX) 10 MG suppository Place 1 suppository (10 mg total) rectally daily as needed for moderate constipation. 10/21/14   Robbie Lis, MD  cholecalciferol (VITAMIN D) 1000 UNITS tablet Take 1,000 Units by mouth every morning.     Historical Provider, MD  citalopram (CELEXA) 20 MG tablet TAKE 1 TABLET BY MOUTH EVERY MORNING 03/15/15   Biagio Borg, MD  CREON 12000 units CPEP capsule TAKE 1 CAPSULE BY MOUTH THREE TIMES DAILY WITH MEALS 03/23/15   Biagio Borg, MD  famotidine (PEPCID) 20 MG tablet Take 1 tablet (20 mg total) by mouth daily. 08/20/14   Robbie Lis, MD  feeding supplement, ENSURE ENLIVE, (ENSURE ENLIVE) LIQD Take 237 mLs by mouth 2 (two) times daily between meals. 10/21/14   Robbie Lis, MD  furosemide (LASIX) 80 MG tablet TAKE 1 TABLET BY MOUTH TWO TIMES DAILY 05/10/15   Biagio Borg, MD  hydrALAZINE (APRESOLINE) 25 MG  tablet Take 1 tablet (25 mg total) by mouth 3 (three) times daily. 08/20/14   Robbie Lis, MD  isosorbide mononitrate (IMDUR) 30 MG 24 hr tablet TAKE 1 TABLET BY MOUTH EVERY MORNING 04/05/15   Biagio Borg, MD  JANUVIA 50 MG tablet TAKE 1 TABLET (50 MG TOTAL) BY MOUTH EVERY MORNING. 05/21/15   Biagio Borg, MD  lansoprazole (PREVACID) 30 MG capsule TAKE 1 CAPSULE BY MOUTH EVERY MORNING 04/05/15   Biagio Borg, MD  meclizine (ANTIVERT) 12.5 MG tablet TAKE 1 TABLET BY MOUTH 3 TIMES A DAY AS NEEDED FOR DIZZINESS 05/17/15   Biagio Borg, MD  meclizine (ANTIVERT) 12.5 MG tablet TAKE 1 TABLET BY MOUTH 3 TIMES A DAY AS NEEDED FOR DIZZINESS 05/24/15   Binnie Rail, MD  meloxicam (MOBIC) 7.5 MG tablet TAKE 1 TABLET BY MOUTH EVERY DAY 06/08/15   Jeneen Rinks  Quin Hoop, MD  OLANZapine (ZYPREXA) 5 MG tablet TAKE 1 TABLET BY MOUTH EVERY MORNING 03/15/15   Biagio Borg, MD  ondansetron (ZOFRAN) 4 MG tablet Take 1 tablet (4 mg total) by mouth every 8 (eight) hours as needed for nausea or vomiting. 04/12/15   Binnie Rail, MD  ONE TOUCH ULTRA TEST test strip USE TO CHECK BLOOD SUGAR ONCE A DAY AS DIRECTED 01/12/15   Biagio Borg, MD  potassium chloride (K-DUR) 10 MEQ tablet Take 1 tablet (10 mEq total) by mouth daily. 08/20/14   Robbie Lis, MD  QUEtiapine (SEROQUEL) 50 MG tablet TAKE 1 TABLET (50 MG TOTAL) BY MOUTH AT BEDTIME. Patient taking differently: Take 50 mg by mouth at bedtime.  08/20/14   Robbie Lis, MD  SYNTHROID 100 MCG tablet TAKE 1 TABLET BY MOUTH DAILY BEFORE BREAKFAST 04/05/15   Biagio Borg, MD  tamsulosin [facility]) 0.4 MG CAPS capsule TAKE 1 CAPSULE BY MOUTH AT BEDTIME 04/05/15   Biagio Borg, MD  tiZANidine (ZANAFLEX) 4 MG tablet Take 1 tablet (4 mg total) by mouth every 6 (six) hours as needed for muscle spasms. 12/29/14   Biagio Borg, MD   BP 220/87 mmHg  Pulse 67  Temp(Src) 97.7 F (36.5 C) (Oral)  Resp 17  Ht 5\' 9"  (1.753 m)  Wt 169 lb (76.658 kg)  BMI 24.95 kg/m2  SpO2 93% Physical Exam   Constitutional: He is oriented to person, place, and time. He appears well-developed and well-nourished.  HENT:  Head: Normocephalic and atraumatic.  Right Ear: External ear normal.  Left Ear: External ear normal.  Nose: Nose normal.  Mouth/Throat: Mucous membranes are dry.  Eyes: EOM are normal. Pupils are equal, round, and reactive to light. Right eye exhibits no discharge. Left eye exhibits no discharge.  Neck: Neck supple.  Cardiovascular: Normal rate, regular rhythm, normal heart sounds and intact distal pulses.   Pulmonary/Chest: Effort normal and breath sounds normal.  Abdominal: Soft. There is tenderness in the right upper quadrant and epigastric area.  Well healed surgical scars  Musculoskeletal: He exhibits no edema.  Neurological: He is alert and oriented to person, place, and time.  CN 2-12 grossly intact. 5/5 strength in all 4 extremities. Grossly normal sensation. Normal finger to nose  Skin: Skin is warm and dry.  Nursing note and vitals reviewed.   ED Course  Procedures (including critical care time) Labs Review Labs Reviewed  COMPREHENSIVE METABOLIC PANEL - Abnormal; Notable for the following:    Chloride 100 (*)    Glucose, Bld 213 (*)    BUN 29 (*)    Creatinine, Ser 1.66 (*)    Total Protein 8.3 (*)    GFR calc non Af Amer 34 (*)    GFR calc Af Amer 40 (*)    All other components within normal limits  URINALYSIS, ROUTINE W REFLEX MICROSCOPIC (NOT AT [facility]) - Abnormal; Notable for the following:    Glucose, UA 100 (*)    Protein, ur >300 (*)    All other components within normal limits  URINE MICROSCOPIC-ADD ON - Abnormal; Notable for the following:    Squamous Epithelial / LPF 0-5 (*)    Bacteria, UA RARE (*)    All other components within normal limits  LIPASE, BLOOD  CBC  I-STAT TROPOININ, ED    Imaging Review Ct Abdomen Pelvis Wo Contrast  06/24/2015  CLINICAL DATA:  Vomiting. EXAM: CT ABDOMEN AND PELVIS WITHOUT CONTRAST TECHNIQUE: Multidetector  CT imaging of the abdomen and pelvis was performed following the standard protocol without IV contrast. COMPARISON:  CT scan of May 24, 2013. FINDINGS: Visualized lung bases are unremarkable. No significant osseous abnormality is noted. Status post cholecystectomy. Stable right hepatic cyst is noted No focal abnormality is noted in the spleen or pancreas on these unenhanced images. Adrenal glands are unremarkable. Stable left renal cysts are noted. Stable small right renal cyst is noted. No hydronephrosis or renal obstruction is noted. No renal or ureteral calculi are noted. Atherosclerosis of abdominal aorta is noted without aneurysm formation. Status post partial right colectomy. No abnormal fluid collection is noted. There is no evidence of bowel obstruction. Urinary bladder appears normal stable mild prostatic enlargement is noted with associated calcifications. No significant adenopathy is noted. IMPRESSION: Stable bilateral renal cysts. Stable moderate prostatic enlargement. Atherosclerosis of abdominal aorta is noted without aneurysm formation. No acute abnormality seen in the abdomen or pelvis. Electronically Signed   By: Marijo Conception, M.D.   On: 06/24/2015 13:13   Ct Head Wo Contrast  06/24/2015  CLINICAL DATA:  Acute headache and vomiting EXAM: CT HEAD WITHOUT CONTRAST TECHNIQUE: Contiguous axial images were obtained from the base of the skull through the vertex without intravenous contrast. COMPARISON:  10/19/2014 FINDINGS: Skull and Sinuses:Negative for fracture or destructive process. Presumed mucous retention cyst in the right maxillary antrum, stable. Visualized orbits: Cataract resections.  No acute finding. Brain: No evidence of acute infarction, hemorrhage, hydrocephalus, or mass lesion/mass effect. Generalized atrophy, age congruent and stable. Mild microvascular ischemic changes in the cerebral white matter. IMPRESSION: No acute finding or change from 2016. Age congruent senescent  changes. Electronically Signed   By: Monte Fantasia M.D.   On: 06/24/2015 12:05   I have personally reviewed and evaluated these images and lab results as part of my medical decision-making.   EKG Interpretation   Date/Time:  Thursday June 24 2015 13:11:35 EDT Ventricular Rate:  63 PR Interval:  176 QRS Duration: 105 QT Interval:  497 QTC Calculation: 509 R Axis:   -31 Text Interpretation:  Sinus rhythm Abnormal R-wave progression, early  transition Left ventricular hypertrophy Prolonged QT interval no  significant change since Aug 2016 Confirmed by Regenia Skeeter  MD, Tylasia Fletchall ([id number])  on 06/24/2015 1:20:54 PM      MDM   Final diagnoses:  Nausea and vomiting in adult  Acute nonintractable headache, unspecified headache type  Essential hypertension    Patient's nausea resolved with zofran. CT shows no obvious cause for abdominal pain, likely a viral illness causing the vomiting. Headache likely from hypertension. He did not take his HTN meds due to vomiting this AM. After being given these he was observed in ED and his BP trended down. Headache now resolved. CT head unremarkable. Very low suspicion for missed SAH as headache was obtained within a couple hours (much less than 6) of headache onset. Likely from increased BP. Tolerating fluids. Highly doubt this is atypical ACS. D/c with anti-emeticis, f/u with PCP for better BP control.    Sherwood Gambler, MD 06/24/15 [phone]

## 2015-06-24 NOTE — ED Notes (Signed)
Attempt to collect labs unsuccessful  

## 2015-06-24 NOTE — ED Notes (Signed)
Bed: WA07 Expected date:  Expected time:  Means of arrival:  Comments: 

## 2015-07-01 ENCOUNTER — Ambulatory Visit (INDEPENDENT_AMBULATORY_CARE_PROVIDER_SITE_OTHER): Payer: Medicare Other | Admitting: Internal Medicine

## 2015-07-01 ENCOUNTER — Encounter: Payer: Self-pay | Admitting: Internal Medicine

## 2015-07-01 VITALS — BP 118/60 | HR 65 | Temp 98.2°F | Resp 20 | Wt 155.0 lb

## 2015-07-01 DIAGNOSIS — N183 Chronic kidney disease, stage 3 unspecified: Secondary | ICD-10-CM

## 2015-07-01 DIAGNOSIS — F0390 Unspecified dementia without behavioral disturbance: Secondary | ICD-10-CM | POA: Diagnosis not present

## 2015-07-01 DIAGNOSIS — I10 Essential (primary) hypertension: Secondary | ICD-10-CM

## 2015-07-01 DIAGNOSIS — E1122 Type 2 diabetes mellitus with diabetic chronic kidney disease: Secondary | ICD-10-CM

## 2015-07-01 DIAGNOSIS — R112 Nausea with vomiting, unspecified: Secondary | ICD-10-CM | POA: Diagnosis not present

## 2015-07-01 MED ORDER — MECLIZINE HCL 12.5 MG PO TABS: ORAL_TABLET | ORAL | Status: DC

## 2015-07-01 NOTE — Patient Instructions (Signed)
Please continue all other medications as before, and refills have been done if requested.  Please have the pharmacy call with any other refills you may need.  Please continue your efforts at being more active, low cholesterol diet, and weight control.  You are otherwise up to date with prevention measures today.  Please keep your appointments with your specialists as you may have planned  Please return in 6 months, or sooner if needed 

## 2015-07-01 NOTE — Progress Notes (Signed)
Subjective:    Patient ID: Nathan Blackwell, male    DOB: 10-24-1920, 82 y.o.   MRN: [id number]  HPI  Here to f/u recent episode nv, now resolved., Denies worsening reflux, abd pain, dysphagia, n/v, bowel change or blood.  Pt denies chest pain, increased sob or doe, wheezing, orthopnea, PND, increased LE swelling, palpitations, dizziness or syncope.  Pt denies new neurological symptoms such as new headache, or facial or extremity weakness or numbness   Pt denies polydipsia, polyuria. Dementia overall stable symptomatically, and not assoc with behavioral changes such as hallucinations, paranoia, or agitation. Past Medical History  Diagnosis Date  . Diabetes mellitus type II   . Hyperlipidemia   . Hypertension   . Osteoporosis   . Prostatic hypertrophy     benign  . OSA (obstructive sleep apnea)   . Rotator cuff syndrome     chronic  . Elevated PSA   . Hypothyroidism   . Nephrolithiasis   . Thyroid nodule   . GERD (gastroesophageal reflux disease)   . Colitis, Clostridium difficile     presumed 11/08  . Dementia   . Anemia     NOS  . Vitamin B 12 deficiency   . BRADYCARDIA 06/10/2009  . COLON CANCER, HX OF 09/20/2006  . HYPOTHYROIDISM 01/14/2007  . DIABETES MELLITUS, TYPE II 09/20/2006  . HYPERLIPIDEMIA 09/20/2006  . ANEMIA-NOS 06/23/2008  . DEMENTIA 05/14/2007  . DEPRESSION 02/27/2007  . SLEEP APNEA, OBSTRUCTIVE 01/14/2007  . HYPERTENSION 09/20/2006  . GERD 01/14/2007  . OSTEOPOROSIS 10/08/2006  . PERIPHERAL EDEMA 09/03/2007  . RENAL INSUFFICIENCY 04/01/2010  . B12 deficiency 05/25/2010  . CLOSTRIDIUM DIFFICILE COLITIS 01/30/2007  . THYROID NODULE 01/14/2007  . CONSTIPATION, RECURRENT 01/06/2010  . BENIGN PROSTATIC HYPERTROPHY 10/08/2006  . ARTHRITIS 02/23/2007  . BACK PAIN, THORACIC REGION 06/04/2008  . COLONIC POLYPS, HX OF 10/08/2006  . NEPHROLITHIASIS, HX OF 01/14/2007  . Cancer of colon ([place]) dx'd 1998  . 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 NEOP, MALIGNANT, GLOTTIS 09/20/2006    . DVT of lower limb, acute ([place]) 01/12/2011  . Sleep apnea     CPAP dependent  . Diastolic congestive heart failure ([place])   . Hypothyroidism   . Diabetes mellitus [facility])    Past Surgical History  Procedure Laterality Date  . Cholecystectomy  1999  . Inguinal herniorrhapy  1984  . Appendectomy  1998  . Colon resection  1998  . Skin cancer extraction      right ear-extensive scar  . Cataract extraction, bilateral    . Nm lexiscan myoview ltd  09/02/2013    Normal LV function and wall motion. EF 62%; rate dependent LBBB with Lexiscan, Low Risk fixed inferior defect suggestive of diaphragmatic attenuation and not infarct with normal wall motion.  . Transthoracic echocardiogram  08/03/2009    Normal LV size. Mild LVH. EF 60-65%. Gr 2 DD, mild MR.  . Esophagogastroduodenoscopy (egd) with propofol N/A 12/19/2013    Procedure: ESOPHAGOGASTRODUODENOSCOPY (EGD) WITH PROPOFOL;  Surgeon: Inda Castle, MD;  Location: [facility];  Service: Endoscopy;  Laterality: N/A;  . Savory dilation N/A 12/19/2013    Procedure: SAVORY DILATION;  Surgeon: Inda Castle, MD;  Location: [facility];  Service: Endoscopy;  Laterality: N/A;  With Fluoroscopy  . Esophagogastroduodenoscopy N/A 12/30/2013    Procedure: ESOPHAGOGASTRODUODENOSCOPY (EGD);  Surgeon: Inda Castle, MD;  Location: [facility];  Service: Endoscopy;  Laterality: N/A;  . Savory dilation N/A 12/30/2013    Procedure: SAVORY  DILATION;  Surgeon: Inda Castle, MD;  Location: [facility];  Service: Endoscopy;  Laterality: N/A;    reports that he has never smoked. He has never used smokeless tobacco. He reports that he does not drink alcohol or use illicit drugs. family history includes Diabetes in his mother; Stroke in his father. Allergies  Allergen Reactions  . Biaxin [Clarithromycin] Other (See Comments)    Unknown   . Other Other (See Comments)    Nephrologist has told patient not to eat many different foods (tomatoes, Green  Foods, etc).   . Sulfa Antibiotics Other (See Comments)    Unknown    Current Outpatient Prescriptions on File Prior to Visit  Medication Sig Dispense Refill  . aspirin EC 81 MG EC tablet Take 1 tablet (81 mg total) by mouth daily. 30 tablet 0  . bisacodyl (DULCOLAX) 10 MG suppository Place 1 suppository (10 mg total) rectally daily as needed for moderate constipation. 12 suppository 0  . cholecalciferol (VITAMIN D) 1000 UNITS tablet Take 1,000 Units by mouth every morning.     . citalopram (CELEXA) 20 MG tablet TAKE 1 TABLET BY MOUTH EVERY MORNING 90 tablet 0  . CREON 12000 units CPEP capsule TAKE 1 CAPSULE BY MOUTH THREE TIMES DAILY WITH MEALS 300 capsule 2  . famotidine (PEPCID) 20 MG tablet Take 1 tablet (20 mg total) by mouth daily. 30 tablet 0  . feeding supplement, ENSURE ENLIVE, (ENSURE ENLIVE) LIQD Take 237 mLs by mouth 2 (two) times daily between meals. 237 mL 12  . furosemide (LASIX) 80 MG tablet TAKE 1 TABLET BY MOUTH TWO TIMES DAILY 180 tablet 0  . hydrALAZINE (APRESOLINE) 25 MG tablet Take 1 tablet (25 mg total) by mouth 3 (three) times daily. 90 tablet 0  . isosorbide mononitrate (IMDUR) 30 MG 24 hr tablet TAKE 1 TABLET BY MOUTH EVERY MORNING 90 tablet 1  . JANUVIA 50 MG tablet TAKE 1 TABLET (50 MG TOTAL) BY MOUTH EVERY MORNING. 90 tablet 1  . lansoprazole (PREVACID) 30 MG capsule TAKE 1 CAPSULE BY MOUTH EVERY MORNING 90 capsule 1  . meloxicam (MOBIC) 7.5 MG tablet TAKE 1 TABLET BY MOUTH EVERY DAY 30 tablet 2  . OLANZapine (ZYPREXA) 5 MG tablet TAKE 1 TABLET BY MOUTH EVERY MORNING 90 tablet 0  . ondansetron (ZOFRAN ODT) 4 MG disintegrating tablet 4mg  ODT q8 hours prn nausea/vomiting 10 tablet 0  . ondansetron (ZOFRAN) 4 MG tablet Take 1 tablet (4 mg total) by mouth every 8 (eight) hours as needed for nausea or vomiting. 20 tablet 0  . ONE TOUCH ULTRA TEST test strip USE TO CHECK BLOOD SUGAR ONCE A DAY AS DIRECTED 100 each 11  . potassium chloride (K-DUR) 10 MEQ tablet Take 1  tablet (10 mEq total) by mouth daily. 30 tablet 0  . QUEtiapine (SEROQUEL) 50 MG tablet TAKE 1 TABLET (50 MG TOTAL) BY MOUTH AT BEDTIME. (Patient taking differently: Take 50 mg by mouth at bedtime. ) 30 tablet 0  . SYNTHROID 100 MCG tablet TAKE 1 TABLET BY MOUTH DAILY BEFORE BREAKFAST 90 tablet 1  . tamsulosin (FLOMAX) 0.4 MG CAPS capsule TAKE 1 CAPSULE BY MOUTH AT BEDTIME 90 capsule 1  . tiZANidine (ZANAFLEX) 4 MG tablet Take 1 tablet (4 mg total) by mouth every 6 (six) hours as needed for muscle spasms. 30 tablet 1   No current facility-administered medications on file prior to visit.   Review of Systems  Constitutional: Negative for unusual diaphoresis or night sweats HENT:  Negative for ear swelling or discharge Eyes: Negative for worsening visual haziness  Respiratory: Negative for choking and stridor.   Gastrointestinal: Negative for distension or worsening eructation Genitourinary: Negative for retention or change in urine volume.  Musculoskeletal: Negative for other MSK pain or swelling Skin: Negative for color change and worsening wound Neurological: Negative for tremors and numbness other than noted  Psychiatric/Behavioral: Negative for decreased concentration or agitation other than above       Objective:   Physical Exam BP 118/60 mmHg  Pulse 65  Temp(Src) 98.2 F (36.8 C) (Oral)  Resp 20  Wt 155 lb (70.308 kg)  SpO2 97% VS noted,  Constitutional: Pt appears in no apparent distress HENT: Head: NCAT.  Right Ear: External ear normal.  Left Ear: External ear normal.  Eyes: . Pupils are equal, round, and reactive to light. Conjunctivae and EOM are normal Neck: Normal range of motion. Neck supple.  Cardiovascular: Normal rate and regular rhythm.   Pulmonary/Chest: Effort normal and breath sounds without rales or wheezing.  Abd:  Soft, NT, ND, + BS Neurological: Pt is alert. At baseline confused , motor grossly intact Skin: Skin is warm. No rash, no LE edema Psychiatric:  Pt behavior is normal. No agitation.     Assessment & Plan:

## 2015-07-01 NOTE — Progress Notes (Signed)
Pre visit review using our clinic review tool, if applicable. No additional management support is needed unless otherwise documented below in the visit note. 

## 2015-07-04 NOTE — Assessment & Plan Note (Signed)
stable overall by history and exam, recent data reviewed with pt, and pt to continue medical treatment as before,  to f/u any worsening symptoms or concerns BP Readings from Last 3 Encounters:  07/01/15 118/60  06/24/15 168/84  04/12/15 164/82

## 2015-07-04 NOTE — Assessment & Plan Note (Signed)
Recurrent episodes in past, s/p GI evaluation, currently asympt, to continue same tx,  to f/u any worsening symptoms or concerns

## 2015-07-04 NOTE — Assessment & Plan Note (Signed)
stable overall by history and exam, recent data reviewed with pt, and pt to continue medical treatment as before,  to f/u any worsening symptoms or concerns Lab Results  Component Value Date   HGBA1C 6.9* 10/20/2014

## 2015-07-04 NOTE — Assessment & Plan Note (Signed)
stable overall by history and exam, recent data reviewed with pt, and pt to continue medical treatment as before,  to f/u any worsening symptoms or concerns Lab Results  Component Value Date   WBC 10.5 06/24/2015   HGB 13.9 06/24/2015   HCT 40.1 06/24/2015   PLT 190 06/24/2015   GLUCOSE 213* 06/24/2015   CHOL 208* 05/05/2014   TRIG 268.0* 05/05/2014   HDL 40.30 05/05/2014   LDLDIRECT 139.0 05/05/2014   LDLCALC 92 09/21/2011   ALT 17 06/24/2015   AST 27 06/24/2015   NA 142 06/24/2015   K 3.9 06/24/2015   CL 100* 06/24/2015   CREATININE 1.66* 06/24/2015   BUN 29* 06/24/2015   CO2 27 06/24/2015   TSH 3.134 10/20/2014   PSA 4.88* 01/14/2007   INR 1.12 08/18/2014   HGBA1C 6.9* 10/20/2014   MICROALBUR 6.3* 05/05/2014

## 2015-07-06 DIAGNOSIS — N184 Chronic kidney disease, stage 4 (severe): Secondary | ICD-10-CM | POA: Diagnosis not present

## 2015-07-14 DIAGNOSIS — I10 Essential (primary) hypertension: Secondary | ICD-10-CM | POA: Diagnosis not present

## 2015-07-14 DIAGNOSIS — D631 Anemia in chronic kidney disease: Secondary | ICD-10-CM | POA: Diagnosis not present

## 2015-07-14 DIAGNOSIS — N2581 Secondary hyperparathyroidism of renal origin: Secondary | ICD-10-CM | POA: Diagnosis not present

## 2015-07-14 DIAGNOSIS — N184 Chronic kidney disease, stage 4 (severe): Secondary | ICD-10-CM | POA: Diagnosis not present

## 2015-07-15 ENCOUNTER — Encounter: Payer: Self-pay | Admitting: Internal Medicine

## 2015-08-24 ENCOUNTER — Other Ambulatory Visit: Payer: Self-pay | Admitting: Internal Medicine

## 2015-08-25 DIAGNOSIS — I70293 Other atherosclerosis of native arteries of extremities, bilateral legs: Secondary | ICD-10-CM | POA: Diagnosis not present

## 2015-08-25 DIAGNOSIS — E1351 Other specified diabetes mellitus with diabetic peripheral angiopathy without gangrene: Secondary | ICD-10-CM | POA: Diagnosis not present

## 2015-08-25 DIAGNOSIS — L602 Onychogryphosis: Secondary | ICD-10-CM | POA: Diagnosis not present

## 2015-08-30 ENCOUNTER — Other Ambulatory Visit: Payer: Self-pay | Admitting: Internal Medicine

## 2015-08-31 ENCOUNTER — Other Ambulatory Visit: Payer: Self-pay | Admitting: *Deleted

## 2015-09-13 ENCOUNTER — Other Ambulatory Visit: Payer: Self-pay | Admitting: Internal Medicine

## 2015-09-14 ENCOUNTER — Other Ambulatory Visit: Payer: Self-pay

## 2015-09-14 ENCOUNTER — Other Ambulatory Visit: Payer: Self-pay | Admitting: *Deleted

## 2015-09-14 NOTE — Telephone Encounter (Signed)
Received fax pt requesting refill on Prednisone 10 mg take 1 three times a day after meals #21. Last filled 08/17/14. Fax back DENIED NOT MAINTENANCE WILL NEED OV FOR REFILL...Johny Chess

## 2015-09-27 ENCOUNTER — Other Ambulatory Visit: Payer: Self-pay | Admitting: Internal Medicine

## 2015-09-28 NOTE — Telephone Encounter (Signed)
Done erx 

## 2015-10-07 DIAGNOSIS — Z86018 Personal history of other benign neoplasm: Secondary | ICD-10-CM | POA: Diagnosis not present

## 2015-10-07 DIAGNOSIS — L821 Other seborrheic keratosis: Secondary | ICD-10-CM | POA: Diagnosis not present

## 2015-10-07 DIAGNOSIS — Q828 Other specified congenital malformations of skin: Secondary | ICD-10-CM | POA: Diagnosis not present

## 2015-10-07 DIAGNOSIS — D485 Neoplasm of uncertain behavior of skin: Secondary | ICD-10-CM | POA: Diagnosis not present

## 2015-10-07 DIAGNOSIS — Z85828 Personal history of other malignant neoplasm of skin: Secondary | ICD-10-CM | POA: Diagnosis not present

## 2015-10-07 DIAGNOSIS — D0422 Carcinoma in situ of skin of left ear and external auricular canal: Secondary | ICD-10-CM | POA: Diagnosis not present

## 2015-10-07 DIAGNOSIS — I87303 Chronic venous hypertension (idiopathic) without complications of bilateral lower extremity: Secondary | ICD-10-CM | POA: Diagnosis not present

## 2015-10-07 DIAGNOSIS — L0100 Impetigo, unspecified: Secondary | ICD-10-CM | POA: Diagnosis not present

## 2015-10-07 DIAGNOSIS — L219 Seborrheic dermatitis, unspecified: Secondary | ICD-10-CM | POA: Diagnosis not present

## 2015-10-27 DIAGNOSIS — L602 Onychogryphosis: Secondary | ICD-10-CM | POA: Diagnosis not present

## 2015-10-27 DIAGNOSIS — I70293 Other atherosclerosis of native arteries of extremities, bilateral legs: Secondary | ICD-10-CM | POA: Diagnosis not present

## 2015-10-27 DIAGNOSIS — E1351 Other specified diabetes mellitus with diabetic peripheral angiopathy without gangrene: Secondary | ICD-10-CM | POA: Diagnosis not present

## 2015-10-29 DIAGNOSIS — D0422 Carcinoma in situ of skin of left ear and external auricular canal: Secondary | ICD-10-CM | POA: Diagnosis not present

## 2015-11-14 ENCOUNTER — Other Ambulatory Visit: Payer: Self-pay | Admitting: Internal Medicine

## 2015-11-16 ENCOUNTER — Other Ambulatory Visit: Payer: Self-pay | Admitting: Internal Medicine

## 2015-11-29 ENCOUNTER — Other Ambulatory Visit: Payer: Self-pay | Admitting: Internal Medicine

## 2015-12-23 ENCOUNTER — Encounter: Payer: Self-pay | Admitting: Internal Medicine

## 2015-12-23 ENCOUNTER — Ambulatory Visit (INDEPENDENT_AMBULATORY_CARE_PROVIDER_SITE_OTHER): Payer: Medicare Other | Admitting: Internal Medicine

## 2015-12-23 ENCOUNTER — Other Ambulatory Visit (INDEPENDENT_AMBULATORY_CARE_PROVIDER_SITE_OTHER): Payer: Medicare Other

## 2015-12-23 VITALS — BP 128/82 | HR 88 | Temp 98.0°F | Resp 20 | Wt 170.0 lb

## 2015-12-23 DIAGNOSIS — E1122 Type 2 diabetes mellitus with diabetic chronic kidney disease: Secondary | ICD-10-CM

## 2015-12-23 DIAGNOSIS — N183 Chronic kidney disease, stage 3 unspecified: Secondary | ICD-10-CM

## 2015-12-23 DIAGNOSIS — E785 Hyperlipidemia, unspecified: Secondary | ICD-10-CM | POA: Diagnosis not present

## 2015-12-23 DIAGNOSIS — M79604 Pain in right leg: Secondary | ICD-10-CM

## 2015-12-23 DIAGNOSIS — I10 Essential (primary) hypertension: Secondary | ICD-10-CM | POA: Diagnosis not present

## 2015-12-23 DIAGNOSIS — R7989 Other specified abnormal findings of blood chemistry: Secondary | ICD-10-CM | POA: Diagnosis not present

## 2015-12-23 LAB — HEMOGLOBIN A1C: Hgb A1c MFr Bld: 7.2 % — ABNORMAL HIGH (ref 4.6–6.5)

## 2015-12-23 LAB — CBC WITH DIFFERENTIAL/PLATELET
BASOS ABS: 0 10*3/uL (ref 0.0–0.1)
Basophils Relative: 0.3 % (ref 0.0–3.0)
EOS ABS: 0.1 10*3/uL (ref 0.0–0.7)
Eosinophils Relative: 1.3 % (ref 0.0–5.0)
HCT: 35.5 % — ABNORMAL LOW (ref 39.0–52.0)
Hemoglobin: 11.8 g/dL — ABNORMAL LOW (ref 13.0–17.0)
LYMPHS ABS: 0.9 10*3/uL (ref 0.7–4.0)
LYMPHS PCT: 10 % — AB (ref 12.0–46.0)
MCHC: 33.3 g/dL (ref 30.0–36.0)
MCV: 95.6 fl (ref 78.0–100.0)
MONOS PCT: 4.5 % (ref 3.0–12.0)
Monocytes Absolute: 0.4 10*3/uL (ref 0.1–1.0)
NEUTROS ABS: 7.5 10*3/uL (ref 1.4–7.7)
NEUTROS PCT: 83.9 % — AB (ref 43.0–77.0)
PLATELETS: 240 10*3/uL (ref 150.0–400.0)
RBC: 3.71 Mil/uL — AB (ref 4.22–5.81)
RDW: 13.5 % (ref 11.5–15.5)
WBC: 9 10*3/uL (ref 4.0–10.5)

## 2015-12-23 LAB — LIPID PANEL
CHOL/HDL RATIO: 5
Cholesterol: 205 mg/dL — ABNORMAL HIGH (ref 0–200)
HDL: 42.6 mg/dL (ref >39.00)
NONHDL: 162.04
Triglycerides: 269 mg/dL — ABNORMAL HIGH (ref 0.0–149.0)
VLDL: 53.8 mg/dL — AB (ref 0.0–40.0)

## 2015-12-23 LAB — HEPATIC FUNCTION PANEL
ALT: 9 U/L (ref 0–53)
AST: 14 U/L (ref 0–37)
Albumin: 4.1 g/dL (ref 3.5–5.2)
Alkaline Phosphatase: 66 U/L (ref 39–117)
BILIRUBIN DIRECT: 0.1 mg/dL (ref 0.0–0.3)
BILIRUBIN TOTAL: 0.4 mg/dL (ref 0.2–1.2)
Total Protein: 7.3 g/dL (ref 6.0–8.3)

## 2015-12-23 LAB — BASIC METABOLIC PANEL
BUN: 45 mg/dL — ABNORMAL HIGH (ref 6–23)
CHLORIDE: 103 meq/L (ref 96–112)
CO2: 29 mEq/L (ref 19–32)
Calcium: 9.6 mg/dL (ref 8.4–10.5)
Creatinine, Ser: 2.07 mg/dL — ABNORMAL HIGH (ref 0.40–1.50)
GFR: 31.99 mL/min — AB (ref >60.00)
Glucose, Bld: 236 mg/dL — ABNORMAL HIGH (ref 70–99)
POTASSIUM: 3.9 meq/L (ref 3.5–5.1)
SODIUM: 144 meq/L (ref 135–145)

## 2015-12-23 LAB — TSH: TSH: 2.26 u[IU]/mL (ref 0.35–4.50)

## 2015-12-23 LAB — LDL CHOLESTEROL, DIRECT: LDL DIRECT: 138 mg/dL

## 2015-12-23 MED ORDER — TRAMADOL HCL 50 MG PO TABS: 50.0000 mg | ORAL_TABLET | Freq: Three times a day (TID) | ORAL | 2 refills | Status: DC | PRN

## 2015-12-23 NOTE — Progress Notes (Signed)
Subjective:    Patient ID: Nathan Blackwell, male    DOB: Jul 21, 1920, 82 y.o.   MRN: [id number]  HPI  Here to f/u, c/o mild, intermittent 1 wk Right leg pain, only with walking, and only when not using the walker, o/w nothing else makes better or worse, except also hurts after walking to try to go to bed at night.  No worsening LBP or radicular pain, knee pain, giveaways or falls. Pain is mostly prox anterior leg without rash, swelling.  Incidentally some improved today, so not sure now he needs much evaluation.  Pt denies chest pain, increased sob or doe, wheezing, orthopnea, PND, increased LE swelling, palpitations, dizziness or syncope.  Pt denies new neurological symptoms such as new headache, or facial or extremity weakness or numbness . Pt denies fever, wt loss, night sweats, loss of appetite, or other constitutional symptoms  Pt denies polydipsia, polyuria  Past Medical History:  Diagnosis Date  . Anemia    NOS  . ANEMIA-NOS 06/23/2008  . ARTHRITIS 02/23/2007  . B12 deficiency 05/25/2010  . BACK PAIN, THORACIC REGION 06/04/2008  . BENIGN PROSTATIC HYPERTROPHY 10/08/2006  . BRADYCARDIA 06/10/2009  . Cancer of colon ([place]) dx'd 1998  . 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 CLOSTRIDIUM DIFFICILE COLITIS 01/30/2007  . Colitis, Clostridium difficile    presumed 11/08  . COLON CANCER, HX OF 09/20/2006  . COLONIC POLYPS, HX OF 10/08/2006  . CONSTIPATION, RECURRENT 01/06/2010  . Dementia   . DEMENTIA 05/14/2007  . DEPRESSION 02/27/2007  . Diabetes mellitus ([place])   . Diabetes mellitus type II   . DIABETES MELLITUS, TYPE II 09/20/2006  . Diastolic congestive heart failure ([place])   . DVT of lower limb, acute ([place]) 01/12/2011  . Elevated PSA   . GERD 01/14/2007  . GERD (gastroesophageal reflux disease)   . Hyperlipidemia   . HYPERLIPIDEMIA 09/20/2006  . Hypertension   . HYPERTENSION 09/20/2006  . Hypothyroidism   . HYPOTHYROIDISM 01/14/2007  . Hypothyroidism   . NEOP, MALIGNANT, GLOTTIS 09/20/2006   . Nephrolithiasis   . NEPHROLITHIASIS, HX OF 01/14/2007  . OSA (obstructive sleep apnea)   . Osteoporosis   . OSTEOPOROSIS 10/08/2006  . PERIPHERAL EDEMA 09/03/2007  . Prostatic hypertrophy    benign  . RENAL INSUFFICIENCY 04/01/2010  . Rotator cuff syndrome    chronic  . Sleep apnea    CPAP dependent  . SLEEP APNEA, OBSTRUCTIVE 01/14/2007  . Thyroid nodule   . THYROID NODULE 01/14/2007  . Vitamin B 12 deficiency    Past Surgical History:  Procedure Laterality Date  . APPENDECTOMY  1998  . CATARACT EXTRACTION, BILATERAL    . CHOLECYSTECTOMY  1999  . COLON RESECTION  1998  . ESOPHAGOGASTRODUODENOSCOPY N/A 12/30/2013   Procedure: ESOPHAGOGASTRODUODENOSCOPY (EGD);  Surgeon: Inda Castle, MD;  Location: [facility];  Service: Endoscopy;  Laterality: N/A;  . ESOPHAGOGASTRODUODENOSCOPY (EGD) WITH PROPOFOL N/A 12/19/2013   Procedure: ESOPHAGOGASTRODUODENOSCOPY (EGD) WITH PROPOFOL;  Surgeon: Inda Castle, MD;  Location: [facility];  Service: Endoscopy;  Laterality: N/A;  . inguinal herniorrhapy  1984  . NM LEXISCAN MYOVIEW LTD  09/02/2013   Normal LV function and wall motion. EF 62%; rate dependent LBBB with Lexiscan, Low Risk fixed inferior defect suggestive of diaphragmatic attenuation and not infarct with normal wall motion.  Marland Kitchen SAVORY DILATION N/A 12/19/2013   Procedure: SAVORY DILATION;  Surgeon: Inda Castle, MD;  Location: [facility];  Service: Endoscopy;  Laterality: N/A;  With Fluoroscopy  .  SAVORY DILATION N/A 12/30/2013   Procedure: SAVORY DILATION;  Surgeon: Inda Castle, MD;  Location: [facility];  Service: Endoscopy;  Laterality: N/A;  . skin cancer extraction     right ear-extensive scar  . TRANSTHORACIC ECHOCARDIOGRAM  08/03/2009   Normal LV size. Mild LVH. EF 60-65%. Gr 2 DD, mild MR.    reports that he has never smoked. He has never used smokeless tobacco. He reports that he does not drink alcohol or use drugs. family history includes Diabetes in his  mother; Stroke in his father. Allergies  Allergen Reactions  . Biaxin [Clarithromycin] Other (See Comments)    Unknown   . Other Other (See Comments)    Nephrologist has told patient not to eat many different foods (tomatoes, Green Foods, etc).   . Sulfa Antibiotics Other (See Comments)    Unknown    Current Outpatient Prescriptions on File Prior to Visit  Medication Sig Dispense Refill  . aspirin EC 81 MG EC tablet Take 1 tablet (81 mg total) by mouth daily. 30 tablet 0  . bisacodyl (DULCOLAX) 10 MG suppository Place 1 suppository (10 mg total) rectally daily as needed for moderate constipation. 12 suppository 0  . cholecalciferol (VITAMIN D) 1000 UNITS tablet Take 1,000 Units by mouth every morning.     . citalopram (CELEXA) 20 MG tablet TAKE 1 TABLET BY MOUTH EVERY MORNING 90 tablet 0  . CREON 12000 units CPEP capsule TAKE 1 CAPSULE BY MOUTH THREE TIMES DAILY WITH MEALS 300 capsule 2  . famotidine (PEPCID) 20 MG tablet Take 1 tablet (20 mg total) by mouth daily. 30 tablet 0  . feeding supplement, ENSURE ENLIVE, (ENSURE ENLIVE) LIQD Take 237 mLs by mouth 2 (two) times daily between meals. 237 mL 12  . furosemide (LASIX) 80 MG tablet TAKE 1 TABLET BY MOUTH TWICE DAILY 180 tablet 0  . hydrALAZINE (APRESOLINE) 25 MG tablet Take 1 tablet (25 mg total) by mouth 3 (three) times daily. 90 tablet 0  . isosorbide mononitrate (IMDUR) 30 MG 24 hr tablet TAKE 1 TABLET BY MOUTH EVERY MORNING 90 tablet 1  . JANUVIA 50 MG tablet TAKE 1 TABLET (50 MG TOTAL) BY MOUTH EVERY MORNING. 90 tablet 1  . lansoprazole (PREVACID) 30 MG capsule TAKE 1 CAPSULE BY MOUTH EVERY MORNING 90 capsule 1  . meclizine (ANTIVERT) 12.5 MG tablet TAKE 1 TABLET BY MOUTH 3 TIMES A DAY AS NEEDED FOR DIZZINESS 30 tablet 5  . meloxicam (MOBIC) 7.5 MG tablet TAKE 1 TABLET BY MOUTH EVERY DAY 30 tablet 0  . OLANZapine (ZYPREXA) 5 MG tablet TAKE 1 TABLET BY MOUTH EVERY MORNING 90 tablet 0  . ondansetron (ZOFRAN) 4 MG tablet Take 1  tablet (4 mg total) by mouth every 8 (eight) hours as needed for nausea or vomiting. 20 tablet 0  . ONE TOUCH ULTRA TEST test strip USE TO CHECK BLOOD SUGAR ONCE A DAY AS DIRECTED 100 each 11  . potassium chloride (K-DUR) 10 MEQ tablet Take 1 tablet (10 mEq total) by mouth daily. 30 tablet 0  . QUEtiapine (SEROQUEL) 50 MG tablet TAKE 1 TABLET BY MOUTH DAILY AT BEDTIME 90 tablet 0  . SYNTHROID 100 MCG tablet TAKE 1 TABLET BY MOUTH DAILY BEFORE BREAKFAST 90 tablet 1  . tamsulosin (FLOMAX) 0.4 MG CAPS capsule TAKE 1 CAPSULE BY MOUTH AT BEDTIME 90 capsule 1  . tiZANidine (ZANAFLEX) 4 MG tablet Take 1 tablet (4 mg total) by mouth every 6 (six) hours as needed for muscle  spasms. 30 tablet 1   No current facility-administered medications on file prior to visit.      Review of Systems  Constitutional: Negative for unusual diaphoresis or night sweats HENT: Negative for ear swelling or discharge Eyes: Negative for worsening visual haziness  Respiratory: Negative for choking and stridor.   Gastrointestinal: Negative for distension or worsening eructation Genitourinary: Negative for retention or change in urine volume.  Musculoskeletal: Negative for other MSK pain or swelling Skin: Negative for color change and worsening wound Neurological: Negative for tremors and numbness other than noted  Psychiatric/Behavioral: Negative for decreased concentration or agitation other than above   All other neg per pt    Objective:   Physical Exam BP 128/82   Pulse 88   Temp 98 F (36.7 C) (Oral)   Resp 20   Wt 170 lb (77.1 kg)   SpO2 98%   BMI 25.10 kg/m  VS noted,  Constitutional: Pt appears in no apparent distress HENT: Head: NCAT.  Right Ear: External ear normal.  Left Ear: External ear normal.  Eyes: . Pupils are equal, round, and reactive to light. Conjunctivae and EOM are normal Neck: Normal range of motion. Neck supple.  Cardiovascular: Normal rate and regular rhythm.   Pulmonary/Chest:  Effort normal and breath sounds without rales or wheezing.  Abd:  Soft, NT, ND, + BS Neurological: Pt is alert. Not confused , motor grossly intact Skin: Skin is warm. No rash, no LE edema Psychiatric: Pt behavior is normal. No agitation.  Right hip flexion and int/ext rotation without pain, no groin or prox anterior leg tender, swelling or rash    Assessment & Plan:

## 2015-12-23 NOTE — Progress Notes (Signed)
Pre visit review using our clinic review tool, if applicable. No additional management support is needed unless otherwise documented below in the visit note. 

## 2015-12-23 NOTE — Patient Instructions (Addendum)
You had the flu shot today  Please take all new medication as prescribed - the pain medication  Please continue all other medications as before, and refills have been done if requested.  Please have the pharmacy call with any other refills you may need.  Please continue your efforts at being more active, low cholesterol diet, and weight control.  Please keep your appointments with your specialists as you may have planned  Please go to the LAB in the Basement (turn left off the elevator) for the tests to be done today  You will be contacted by phone if any changes need to be made immediately.  Otherwise, you will receive a letter about your results with an explanation, but please check with MyChart first.  Please remember to sign up for MyChart if you have not done so, as this will be important to you in the future with finding out test results, communicating by private email, and scheduling acute appointments online when needed.  Please return in 6 months, or sooner if needed

## 2015-12-24 LAB — URINALYSIS, ROUTINE W REFLEX MICROSCOPIC
BILIRUBIN URINE: NEGATIVE
HGB URINE DIPSTICK: NEGATIVE
KETONES UR: NEGATIVE
LEUKOCYTES UA: NEGATIVE
NITRITE: NEGATIVE
PH: 6 (ref 5.0–8.0)
RBC / HPF: NONE SEEN (ref 0–?)
Specific Gravity, Urine: 1.01 (ref 1.000–1.030)
TOTAL PROTEIN, URINE-UPE24: NEGATIVE
Urine Glucose: NEGATIVE
Urobilinogen, UA: 0.2 (ref 0.0–1.0)
WBC UA: NONE SEEN (ref 0–?)

## 2015-12-24 LAB — MICROALBUMIN / CREATININE URINE RATIO
CREATININE, U: 75.9 mg/dL
MICROALB UR: 5.2 mg/dL — AB (ref 0.0–1.9)
Microalb Creat Ratio: 6.9 mg/g (ref 0.0–30.0)

## 2015-12-25 ENCOUNTER — Other Ambulatory Visit: Payer: Self-pay | Admitting: Internal Medicine

## 2015-12-26 ENCOUNTER — Other Ambulatory Visit: Payer: Self-pay | Admitting: Internal Medicine

## 2015-12-26 NOTE — Assessment & Plan Note (Signed)
Etiology unclear, suspect MSK such as muscular or tendon related, improved today, ok for tylenol prn,  to f/u any worsening symptoms or concerns

## 2015-12-26 NOTE — Assessment & Plan Note (Signed)
stable overall by history and exam, recent data reviewed with pt, and pt to continue medical treatment as before,  to f/u any worsening symptoms or concerns Lab Results  Component Value Date   CREATININE 2.07 (H) 12/23/2015

## 2015-12-26 NOTE — Assessment & Plan Note (Signed)
stable overall by history and exam, recent data reviewed with pt, and pt to continue medical treatment as before,  to f/u any worsening symptoms or concerns Lab Results  Component Value Date   LDLCALC 92 09/21/2011    

## 2015-12-26 NOTE — Assessment & Plan Note (Signed)
stable overall by history and exam, recent data reviewed with pt, and pt to continue medical treatment as before,  to f/u any worsening symptoms or concerns Lab Results  Component Value Date   HGBA1C 7.2 (H) 12/23/2015

## 2015-12-26 NOTE — Assessment & Plan Note (Signed)
stable overall by history and exam, recent data reviewed with pt, and pt to continue medical treatment as before,  to f/u any worsening symptoms or concerns BP Readings from Last 3 Encounters:  12/23/15 128/82  07/01/15 118/60  06/24/15 168/84

## 2015-12-29 DIAGNOSIS — E1151 Type 2 diabetes mellitus with diabetic peripheral angiopathy without gangrene: Secondary | ICD-10-CM | POA: Diagnosis not present

## 2015-12-29 DIAGNOSIS — I739 Peripheral vascular disease, unspecified: Secondary | ICD-10-CM | POA: Diagnosis not present

## 2016-01-03 ENCOUNTER — Telehealth: Payer: Self-pay | Admitting: Internal Medicine

## 2016-01-03 NOTE — Telephone Encounter (Signed)
Spoke with pt to inform.  

## 2016-01-03 NOTE — Telephone Encounter (Signed)
Would advise to stop tramadol, add extra water to diet. If nausea or vomiting and unable to tolerate liquids or unable to pass gas needs ER visit. Otherwise can buy magnesium citrate otc and drink.

## 2016-01-03 NOTE — Telephone Encounter (Signed)
Please advise as Dr John is out of the office.  

## 2016-01-03 NOTE — Telephone Encounter (Signed)
[facility]  Patient Name: Nathan Blackwell  DOB: 07/20/1920    Initial Comment Caller states, he has an appointment at 1:30 pm- pain in his leg - took urine sample and on pain rx. - having constipation. Using suppository. Verified    Nurse Assessment  Nurse: Harlow Mares, RN, [name] Date/Time Eilene Ghazi Time): 01/03/2016 3:08:03 PM  Confirm and document reason for call. If symptomatic, describe symptoms. You must click the next button to save text entered. ---Unable to have a BM, and is uncomfortable. Has tried 2-3 suppositories. Has an appt tomorrow. Taking pain meds. Last BM was 2 days ago and was a firm stool. Taking tramadol.  Has the patient traveled out of the country within the last 30 days? ---Not Applicable  Does the patient have any new or worsening symptoms? ---Yes  Will a triage be completed? ---Yes  Related visit to physician within the last 2 weeks? ---Yes  Does the PT have any chronic conditions? (i.e. diabetes, asthma, etc.) ---Yes  List chronic conditions. ---diabetes; HTN  Is this a behavioral health or substance abuse call? ---No     Guidelines    Guideline Title Affirmed Question Affirmed Notes  Constipation Unable to have a bowel movement (BM) without laxative or enema    Final Disposition User   See PCP When Office is Open (within 3 days) Harlow Mares, RN, [place]    Referrals  REFERRED TO PCP OFFICE   Disagree/Comply: Leta Baptist

## 2016-01-04 ENCOUNTER — Ambulatory Visit (INDEPENDENT_AMBULATORY_CARE_PROVIDER_SITE_OTHER): Payer: Medicare Other | Admitting: Internal Medicine

## 2016-01-04 ENCOUNTER — Encounter: Payer: Self-pay | Admitting: Internal Medicine

## 2016-01-04 VITALS — BP 130/78 | HR 90 | Temp 98.4°F | Resp 20 | Wt 163.0 lb

## 2016-01-04 DIAGNOSIS — N183 Chronic kidney disease, stage 3 unspecified: Secondary | ICD-10-CM

## 2016-01-04 DIAGNOSIS — E1122 Type 2 diabetes mellitus with diabetic chronic kidney disease: Secondary | ICD-10-CM

## 2016-01-04 DIAGNOSIS — M79604 Pain in right leg: Secondary | ICD-10-CM | POA: Diagnosis not present

## 2016-01-04 DIAGNOSIS — I5032 Chronic diastolic (congestive) heart failure: Secondary | ICD-10-CM

## 2016-01-04 DIAGNOSIS — R079 Chest pain, unspecified: Secondary | ICD-10-CM

## 2016-01-04 DIAGNOSIS — I10 Essential (primary) hypertension: Secondary | ICD-10-CM

## 2016-01-04 NOTE — Progress Notes (Signed)
Subjective:    Patient ID: Nathan Blackwell, male    DOB: 09-02-1920, 82 y.o.   MRN: [id number]  HPI  Here to f/u with daughter and wife, c/o persistent RLE pain, worse mostly with walking without walker, but has had some otherwise as well since then with walking with walker, has had to use the tramadol fairly regularly, but caused constipation.  Pt denies increased sob or doe, wheezing, orthopnea, PND, increased LE swelling, palpitations, dizziness or syncope, but has had intermittent mild CP sharp pleuritic left upper chets without radiation or assoc symptoms, is nonexertional, nonpositional..  Pt denies new neurological symptoms such as new headache, or facial or extremity weakness or numbness. Past Medical History:  Diagnosis Date  . Anemia    NOS  . ANEMIA-NOS 06/23/2008  . ARTHRITIS 02/23/2007  . B12 deficiency 05/25/2010  . BACK PAIN, THORACIC REGION 06/04/2008  . BENIGN PROSTATIC HYPERTROPHY 10/08/2006  . BRADYCARDIA 06/10/2009  . Cancer of colon ([place]) dx'd 1998  . 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 CLOSTRIDIUM DIFFICILE COLITIS 01/30/2007  . Colitis, Clostridium difficile    presumed 11/08  . COLON CANCER, HX OF 09/20/2006  . COLONIC POLYPS, HX OF 10/08/2006  . CONSTIPATION, RECURRENT 01/06/2010  . Dementia   . DEMENTIA 05/14/2007  . DEPRESSION 02/27/2007  . Diabetes mellitus ([place])   . Diabetes mellitus type II   . DIABETES MELLITUS, TYPE II 09/20/2006  . Diastolic congestive heart failure ([place])   . DVT of lower limb, acute ([place]) 01/12/2011  . Elevated PSA   . GERD 01/14/2007  . GERD (gastroesophageal reflux disease)   . Hyperlipidemia   . HYPERLIPIDEMIA 09/20/2006  . Hypertension   . HYPERTENSION 09/20/2006  . Hypothyroidism   . HYPOTHYROIDISM 01/14/2007  . Hypothyroidism   . 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 Nephrolithiasis   . NEPHROLITHIASIS, HX OF 01/14/2007  . OSA (obstructive sleep apnea)   . Osteoporosis   . OSTEOPOROSIS 10/08/2006  . PERIPHERAL EDEMA 09/03/2007    . Prostatic hypertrophy    benign  . RENAL INSUFFICIENCY 04/01/2010  . Rotator cuff syndrome    chronic  . Sleep apnea    CPAP dependent  . SLEEP APNEA, OBSTRUCTIVE 01/14/2007  . Thyroid nodule   . THYROID NODULE 01/14/2007  . Vitamin B 12 deficiency    Past Surgical History:  Procedure Laterality Date  . APPENDECTOMY  1998  . CATARACT EXTRACTION, BILATERAL    . CHOLECYSTECTOMY  1999  . COLON RESECTION  1998  . ESOPHAGOGASTRODUODENOSCOPY N/A 12/30/2013   Procedure: ESOPHAGOGASTRODUODENOSCOPY (EGD);  Surgeon: Inda Castle, MD;  Location: [facility];  Service: Endoscopy;  Laterality: N/A;  . ESOPHAGOGASTRODUODENOSCOPY (EGD) WITH PROPOFOL N/A 12/19/2013   Procedure: ESOPHAGOGASTRODUODENOSCOPY (EGD) WITH PROPOFOL;  Surgeon: Inda Castle, MD;  Location: [facility];  Service: Endoscopy;  Laterality: N/A;  . inguinal herniorrhapy  1984  . NM LEXISCAN MYOVIEW LTD  09/02/2013   Normal LV function and wall motion. EF 62%; rate dependent LBBB with Lexiscan, Low Risk fixed inferior defect suggestive of diaphragmatic attenuation and not infarct with normal wall motion.  Marland Kitchen SAVORY DILATION N/A 12/19/2013   Procedure: SAVORY DILATION;  Surgeon: Inda Castle, MD;  Location: [facility];  Service: Endoscopy;  Laterality: N/A;  With Fluoroscopy  . SAVORY DILATION N/A 12/30/2013   Procedure: SAVORY DILATION;  Surgeon: Inda Castle, MD;  Location: [facility];  Service: Endoscopy;  Laterality: N/A;  . skin cancer extraction     right  ear-extensive scar  . TRANSTHORACIC ECHOCARDIOGRAM  08/03/2009   Normal LV size. Mild LVH. EF 60-65%. Gr 2 DD, mild MR.    reports that he has never smoked. He has never used smokeless tobacco. He reports that he does not drink alcohol or use drugs. family history includes Diabetes in his mother; Stroke in his father. Allergies  Allergen Reactions  . Biaxin [Clarithromycin] Other (See Comments)    Unknown   . Other Other (See Comments)     Nephrologist has told patient not to eat many different foods (tomatoes, Green [employer], etc).   . Sulfa Antibiotics Other (See Comments)    Unknown    Current Outpatient Prescriptions on File Prior to Visit  Medication Sig Dispense Refill  . aspirin EC 81 MG EC tablet Take 1 tablet (81 mg total) by mouth daily. 30 tablet 0  . bisacodyl (DULCOLAX) 10 MG suppository Place 1 suppository (10 mg total) rectally daily as needed for moderate constipation. 12 suppository 0  . cholecalciferol (VITAMIN D) 1000 UNITS tablet Take 1,000 Units by mouth every morning.     . citalopram (CELEXA) 20 MG tablet TAKE 1 TABLET BY MOUTH EVERY MORNING 90 tablet 0  . CREON 12000 units CPEP capsule TAKE 1 CAPSULE BY MOUTH THREE TIMES DAILY WITH MEALS 300 capsule 2  . famotidine (PEPCID) 20 MG tablet Take 1 tablet (20 mg total) by mouth daily. 30 tablet 0  . feeding supplement, ENSURE ENLIVE, (ENSURE ENLIVE) LIQD Take 237 mLs by mouth 2 (two) times daily between meals. 237 mL 12  . furosemide (LASIX) 80 MG tablet TAKE 1 TABLET BY MOUTH TWICE DAILY 180 tablet 0  . hydrALAZINE (APRESOLINE) 25 MG tablet Take 1 tablet (25 mg total) by mouth 3 (three) times daily. 90 tablet 0  . isosorbide mononitrate (IMDUR) 30 MG 24 hr tablet TAKE 1 TABLET BY MOUTH EVERY MORNING 90 tablet 1  . JANUVIA 50 MG tablet TAKE 1 TABLET (50 MG TOTAL) BY MOUTH EVERY MORNING. 90 tablet 1  . meclizine (ANTIVERT) 12.5 MG tablet TAKE 1 TABLET BY MOUTH 3 TIMES A DAY AS NEEDED FOR DIZZINESS 30 tablet 5  . meloxicam (MOBIC) 7.5 MG tablet TAKE 1 TABLET BY MOUTH EVERY DAY 30 tablet 0  . OLANZapine (ZYPREXA) 5 MG tablet TAKE 1 TABLET BY MOUTH EVERY MORNING 90 tablet 0  . ondansetron (ZOFRAN) 4 MG tablet Take 1 tablet (4 mg total) by mouth every 8 (eight) hours as needed for nausea or vomiting. 20 tablet 0  . ONE TOUCH ULTRA TEST test strip USE TO CHECK BLOOD SUGAR ONCE A DAY AS DIRECTED 100 each 11  . potassium chloride (K-DUR) 10 MEQ tablet Take 1 tablet (10  mEq total) by mouth daily. 30 tablet 0  . QUEtiapine (SEROQUEL) 50 MG tablet TAKE 1 TABLET BY MOUTH DAILY AT BEDTIME 90 tablet 0  . SYNTHROID 100 MCG tablet TAKE 1 TABLET BY MOUTH DAILY BEFORE BREAKFAST 90 tablet 1  . tamsulosin (FLOMAX) 0.4 MG CAPS capsule TAKE 1 CAPSULE BY MOUTH AT BEDTIME 90 capsule 1  . tiZANidine (ZANAFLEX) 4 MG tablet Take 1 tablet (4 mg total) by mouth every 6 (six) hours as needed for muscle spasms. 30 tablet 1  . traMADol (ULTRAM) 50 MG tablet Take 1 tablet (50 mg total) by mouth every 8 (eight) hours as needed. 60 tablet 2   No current facility-administered medications on file prior to visit.    Review of Systems limited by dementia  Constitutional: Negative for  unusual diaphoresis or night sweats HENT: Negative for ear swelling or discharge Eyes: Negative for worsening visual haziness  Respiratory: Negative for choking and stridor.   Gastrointestinal: Negative for distension or worsening eructation Genitourinary: Negative for retention or change in urine volume.  Musculoskeletal: Negative for other MSK pain or swelling Skin: Negative for color change and worsening wound Neurological: Negative for tremors and numbness other than noted  Psychiatric/Behavioral: Negative for decreased concentration or agitation other than above   All other system eng per pt    Objective:   Physical Exam BP 130/78   Pulse 90   Temp 98.4 F (36.9 C) (Oral)   Resp 20   Wt 163 lb (73.9 kg)   SpO2 98%   BMI 24.07 kg/m  VS noted,  Constitutional: Pt appears in no apparent distress HENT: Head: NCAT.  Right Ear: External ear normal.  Left Ear: External ear normal.  Eyes: . Pupils are equal, round, and reactive to light. Conjunctivae and EOM are normal Neck: Normal range of motion. Neck supple.  Cardiovascular: Normal rate and regular rhythm.   Pulmonary/Chest: Effort normal and breath sounds without rales or wheezing.  Neurological: Pt is alert. Not confused , motor grossly  intact Skin: Skin is warm. No rash, no LE edema, dorsalis pedis trace right, 1+ left Psychiatric: Pt behavior is normal. No agitation.   ECG today I have personally interpreted Sinus  Rhythm  -consider old anterior infarct.   -  Nonspecific T-abnormality.     Assessment & Plan:

## 2016-01-04 NOTE — Progress Notes (Signed)
Pre visit review using our clinic review tool, if applicable. No additional management support is needed unless otherwise documented below in the visit note. 

## 2016-01-04 NOTE — Patient Instructions (Signed)
Your EKG was OK today  Please continue all other medications as before, and refills have been done if requested.  Please have the pharmacy call with any other refills you may need.  Please continue your efforts at being more active, low cholesterol diet, and weight control.  Please keep your appointments with your specialists as you may have planned  Please see the PCC's to see about scheduling for the leg circulation test, and the stress test  Please return in 6 months, or sooner if needed

## 2016-01-05 ENCOUNTER — Encounter (HOSPITAL_COMMUNITY): Payer: Medicare Other

## 2016-01-05 ENCOUNTER — Telehealth: Payer: Self-pay

## 2016-01-05 NOTE — Telephone Encounter (Signed)
A user error has taken place.

## 2016-01-09 ENCOUNTER — Other Ambulatory Visit: Payer: Self-pay | Admitting: Internal Medicine

## 2016-01-10 DIAGNOSIS — R079 Chest pain, unspecified: Secondary | ICD-10-CM | POA: Insufficient documentation

## 2016-01-10 NOTE — Assessment & Plan Note (Signed)
Worsening, cant r/o vascular insufficiency, for LE arterail dopplers,  to f/u any worsening symptoms or concerns

## 2016-01-10 NOTE — Assessment & Plan Note (Signed)
stable overall by history and exam, recent data reviewed with pt, and pt to continue medical treatment as before,  to f/u any worsening symptoms or concerns BP Readings from Last 3 Encounters:  01/04/16 130/78  12/23/15 128/82  07/01/15 118/60

## 2016-01-10 NOTE — Assessment & Plan Note (Signed)
Atypical, ecg reviewed, doubt cardiac, most likely c/w msk,.declines further eval or referral,  to f/u any worsening symptoms or concerns

## 2016-01-10 NOTE — Assessment & Plan Note (Signed)
stable overall by history and exam, and pt to continue medical treatment as before,  to f/u any worsening symptoms or concerns 

## 2016-01-10 NOTE — Assessment & Plan Note (Signed)
stable overall by history and exam, recent data reviewed with pt, and pt to continue medical treatment as before,  to f/u any worsening symptoms or concerns Lab Results  Component Value Date   HGBA1C 7.2 (H) 12/23/2015

## 2016-01-11 ENCOUNTER — Telehealth (HOSPITAL_COMMUNITY): Payer: Self-pay | Admitting: *Deleted

## 2016-01-11 NOTE — Telephone Encounter (Signed)
Patient given detailed instructions per Myocardial Perfusion Study Information Sheet for the test on 01/13/16. Patient notified to arrive 15 minutes early and that it is imperative to arrive on time for appointment to keep from having the test rescheduled.  If you need to cancel or reschedule your appointment, please call the office within 24 hours of your appointment. Failure to do so may result in a cancellation of your appointment, and a $50 no show fee. Patient verbalized understanding. Hubbard Robinson, RN

## 2016-01-13 ENCOUNTER — Ambulatory Visit (HOSPITAL_COMMUNITY): Payer: Medicare Other | Attending: Cardiology

## 2016-01-13 ENCOUNTER — Other Ambulatory Visit: Payer: Self-pay | Admitting: Internal Medicine

## 2016-01-13 DIAGNOSIS — R9439 Abnormal result of other cardiovascular function study: Secondary | ICD-10-CM | POA: Diagnosis not present

## 2016-01-13 DIAGNOSIS — E119 Type 2 diabetes mellitus without complications: Secondary | ICD-10-CM | POA: Diagnosis not present

## 2016-01-13 DIAGNOSIS — I10 Essential (primary) hypertension: Secondary | ICD-10-CM | POA: Insufficient documentation

## 2016-01-13 DIAGNOSIS — N184 Chronic kidney disease, stage 4 (severe): Secondary | ICD-10-CM | POA: Diagnosis not present

## 2016-01-13 DIAGNOSIS — R079 Chest pain, unspecified: Secondary | ICD-10-CM | POA: Diagnosis not present

## 2016-01-13 DIAGNOSIS — N2581 Secondary hyperparathyroidism of renal origin: Secondary | ICD-10-CM | POA: Diagnosis not present

## 2016-01-13 DIAGNOSIS — N189 Chronic kidney disease, unspecified: Secondary | ICD-10-CM | POA: Diagnosis not present

## 2016-01-13 DIAGNOSIS — R0609 Other forms of dyspnea: Secondary | ICD-10-CM | POA: Insufficient documentation

## 2016-01-13 LAB — MYOCARDIAL PERFUSION IMAGING
LHR: 0.26
LVDIAVOL: 78 mL (ref 62–150)
LVSYSVOL: 31 mL
NUC STRESS TID: 0.97
Peak HR: 77 {beats}/min
Rest HR: 62 {beats}/min
SDS: 5
SRS: 9
SSS: 12

## 2016-01-13 MED ORDER — TECHNETIUM TC 99M TETROFOSMIN IV KIT
30.4000 | PACK | Freq: Once | INTRAVENOUS | Status: AC | PRN
  Administered 2016-01-13: 30.4 via INTRAVENOUS
  Filled 2016-01-13: qty 31

## 2016-01-13 MED ORDER — REGADENOSON 0.4 MG/5ML IV SOLN
0.4000 mg | Freq: Once | INTRAVENOUS | Status: AC
  Administered 2016-01-13: 0.4 mg via INTRAVENOUS

## 2016-01-13 MED ORDER — TECHNETIUM TC 99M TETROFOSMIN IV KIT
10.1000 | PACK | Freq: Once | INTRAVENOUS | Status: AC | PRN
  Administered 2016-01-13: 10.1 via INTRAVENOUS
  Filled 2016-01-13: qty 11

## 2016-01-16 ENCOUNTER — Other Ambulatory Visit: Payer: Self-pay | Admitting: Internal Medicine

## 2016-01-17 ENCOUNTER — Telehealth: Payer: Self-pay | Admitting: Cardiology

## 2016-01-17 NOTE — Progress Notes (Signed)
Cardiology Office Note   Date:  01/20/2016   ID:  Nathan Blackwell, DOB 1920-03-14, MRN [id number]  PCP:  [name], MD  Cardiologist:   Glenetta Hew   Chief Complaint  Patient presents with  . Establish Care      History of Present Illness: Nathan Blackwell is a 82 y.o. male who presents for evaluation of atypical chest pain and leg pain. Normally sees Dr Ellyn Hack   Seen by Dr Jenny Reichmann 01/04/16 Pain is sharp ? Pleuritic LU chest no radiation. Not exertional not positional RLE pain severe uses tramadol. Walks with walker No history  Of vascular disease.  CRF;s HTN elevated lipids  Noted history of DVT on lef with residual thrombus on dopplers 09/2013   Myovue 01/13/16   The left ventricular ejection fraction is normal (55-65%).  Nuclear stress EF: 60%.  There was no ST segment deviation noted during stress.  There is a small defect of moderate severity present in the apex location. There is a large defect of moderate severity present in the basal inferior, basal inferolateral, mid inferior, mid inferolateral and apical inferior location.These defects are partially reversible. In the setting of normal LVF with no wall motion abnormalities, most likely these defects are due to variations in diaphragmatic attenuation but cannot rule out a small area of ischemia.  This is an intermediate risk study.   Note compared to myovue 09/02/13 little change with previously described fixed inferior defect and then rate dependant LBBB   Carotids done 10/20/14 :  For dizziness [id number] RICA [id number] LICA  Echo:  EF [id number] mild LVH no significant vlave disease ABI's have been ordered but not done yet   Chest pain is atypical not exertional starts in epigastric area Leg pain 3 months is exertional in right leg below knee   Failing health with advanced dementia. Wife and he live with youngest Daughter Retired Scientist, forensic   Past Medical History:  Diagnosis Date  . Anemia    NOS  . ANEMIA-NOS  06/23/2008  . ARTHRITIS 02/23/2007  . B12 deficiency 05/25/2010  . BACK PAIN, THORACIC REGION 06/04/2008  . BENIGN PROSTATIC HYPERTROPHY 10/08/2006  . BRADYCARDIA 06/10/2009  . Cancer of colon ([place]) dx'd 1998  . 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 CLOSTRIDIUM DIFFICILE COLITIS 01/30/2007  . Colitis, Clostridium difficile    presumed 11/08  . COLON CANCER, HX OF 09/20/2006  . COLONIC POLYPS, HX OF 10/08/2006  . CONSTIPATION, RECURRENT 01/06/2010  . Dementia   . DEMENTIA 05/14/2007  . DEPRESSION 02/27/2007  . Diabetes mellitus ([place])   . Diabetes mellitus type II   . DIABETES MELLITUS, TYPE II 09/20/2006  . Diastolic congestive heart failure ([place])   . DVT of lower limb, acute ([place]) 01/12/2011  . Elevated PSA   . GERD 01/14/2007  . GERD (gastroesophageal reflux disease)   . Hyperlipidemia   . HYPERLIPIDEMIA 09/20/2006  . Hypertension   . HYPERTENSION 09/20/2006  . Hypothyroidism   . HYPOTHYROIDISM 01/14/2007  . Hypothyroidism   . 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 Nephrolithiasis   . NEPHROLITHIASIS, HX OF 01/14/2007  . OSA (obstructive sleep apnea)   . Osteoporosis   . OSTEOPOROSIS 10/08/2006  . PERIPHERAL EDEMA 09/03/2007  . Prostatic hypertrophy    benign  . RENAL INSUFFICIENCY 04/01/2010  . Rotator cuff syndrome    chronic  . Sleep apnea    CPAP dependent  . SLEEP APNEA, OBSTRUCTIVE 01/14/2007  . Thyroid nodule   .  THYROID NODULE 01/14/2007  . Vitamin B 12 deficiency     Past Surgical History:  Procedure Laterality Date  . APPENDECTOMY  1998  . CATARACT EXTRACTION, BILATERAL    . CHOLECYSTECTOMY  1999  . COLON RESECTION  1998  . ESOPHAGOGASTRODUODENOSCOPY N/A 12/30/2013   Procedure: ESOPHAGOGASTRODUODENOSCOPY (EGD);  Surgeon: Inda Castle, MD;  [facility];  Service: Endoscopy;  Laterality: N/A;  . ESOPHAGOGASTRODUODENOSCOPY (EGD) WITH PROPOFOL N/A 12/19/2013   Procedure: ESOPHAGOGASTRODUODENOSCOPY (EGD) WITH PROPOFOL;  Surgeon: Inda Castle, MD;   [facility];  Service: Endoscopy;  Laterality: N/A;  . inguinal herniorrhapy  1984  . NM LEXISCAN MYOVIEW LTD  09/02/2013   Normal LV function and wall motion. EF 62%; rate dependent LBBB with Lexiscan, Low Risk fixed inferior defect suggestive of diaphragmatic attenuation and not infarct with normal wall motion.  Marland Kitchen SAVORY DILATION N/A 12/19/2013   Procedure: SAVORY DILATION;  Surgeon: Inda Castle, MD;  [facility];  Service: Endoscopy;  Laterality: N/A;  With Fluoroscopy  . SAVORY DILATION N/A 12/30/2013   Procedure: SAVORY DILATION;  Surgeon: Inda Castle, MD;  [facility];  Service: Endoscopy;  Laterality: N/A;  . skin cancer extraction     right ear-extensive scar  . TRANSTHORACIC ECHOCARDIOGRAM  08/03/2009   Normal LV size. Mild LVH. EF 60-65%. Gr 2 DD, mild MR.     Current Outpatient Prescriptions  Medication Sig Dispense Refill  . aspirin EC 81 MG EC tablet Take 1 tablet (81 mg total) by mouth daily. 30 tablet 0  . bisacodyl (DULCOLAX) 10 MG suppository Place 1 suppository (10 mg total) rectally daily as needed for moderate constipation. 12 suppository 0  . cholecalciferol (VITAMIN D) 1000 UNITS tablet Take 1,000 Units by mouth every morning.     . citalopram (CELEXA) 20 MG tablet TAKE 1 TABLET BY MOUTH EVERY MORNING 90 tablet 0  . CREON 12000 units CPEP capsule TAKE 1 CAPSULE BY MOUTH THREE TIMES DAILY WITH MEALS 300 capsule 2  . famotidine (PEPCID) 20 MG tablet Take 1 tablet (20 mg total) by mouth daily. 30 tablet 0  . feeding supplement, ENSURE ENLIVE, (ENSURE ENLIVE) LIQD Take 237 mLs by mouth 2 (two) times daily between meals. 237 mL 12  . furosemide (LASIX) 80 MG tablet TAKE 1 TABLET BY MOUTH TWICE DAILY 180 tablet 0  . hydrALAZINE (APRESOLINE) 25 MG tablet Take 1 tablet (25 mg total) by mouth 3 (three) times daily. 90 tablet 0  . isosorbide mononitrate (IMDUR) 30 MG 24 hr tablet TAKE 1 TABLET BY MOUTH EVERY MORNING 90 tablet 1  .  JANUVIA 50 MG tablet TAKE 1 TABLET (50 MG TOTAL) BY MOUTH EVERY MORNING. 90 tablet 1  . lansoprazole (PREVACID) 30 MG capsule TAKE 1 CAPSULE BY MOUTH EVERY MORNING 90 capsule 1  . meclizine (ANTIVERT) 12.5 MG tablet TAKE 1 TABLET BY MOUTH 3 TIMES A DAY AS NEEDED FOR DIZZINESS 30 tablet 5  . meloxicam (MOBIC) 7.5 MG tablet TAKE 1 TABLET BY MOUTH EVERY DAY 30 tablet 0  . OLANZapine (ZYPREXA) 5 MG tablet TAKE 1 TABLET BY MOUTH EVERY MORNING 90 tablet 0  . ondansetron (ZOFRAN) 4 MG tablet Take 1 tablet (4 mg total) by mouth every 8 (eight) hours as needed for nausea or vomiting. 20 tablet 0  . ONE TOUCH ULTRA TEST test strip USE TO CHECK BLOOD SUGAR ONCE A DAY AS DIRECTED 100 each 11  . potassium chloride (K-DUR) 10 MEQ tablet Take 1 tablet (  10 mEq total) by mouth daily. 30 tablet 0  . QUEtiapine (SEROQUEL) 50 MG tablet TAKE 1 TABLET BY MOUTH DAILY AT BEDTIME 90 tablet 0  . SYNTHROID 100 MCG tablet TAKE 1 TABLET BY MOUTH DAILY BEFORE BREAKFAST 90 tablet 1  . tamsulosin (FLOMAX) 0.4 MG CAPS capsule TAKE 1 CAPSULE BY MOUTH AT BEDTIME 90 capsule 1  . tiZANidine (ZANAFLEX) 4 MG tablet Take 1 tablet (4 mg total) by mouth every 6 (six) hours as needed for muscle spasms. 30 tablet 1  . traMADol (ULTRAM) 50 MG tablet Take 1 tablet (50 mg total) by mouth every 8 (eight) hours as needed. 60 tablet 2   No current facility-administered medications for this visit.     Allergies:   Biaxin [clarithromycin]; Other; and Sulfa antibiotics    Social History:  The patient  reports that he has never smoked. He has never used smokeless tobacco. He reports that he does not drink alcohol or use drugs.   Family History:  The patient's family history includes Diabetes in his mother; Stroke in his father.    ROS:  Please see the history of present illness.   Otherwise, review of systems are positive for none.   All other systems are reviewed and negative.    PHYSICAL EXAM: VS:  BP (!) 142/60   Pulse 79   Ht 5' 9.5"  (1.765 m)   Wt 71.2 kg (157 lb)   SpO2 98%   BMI 22.85 kg/m  , BMI Body mass index is 22.85 kg/m. Affect appropriate Elderly white male  HEENT: tearing right eye  Neck supple with no adenopathy JVP normal no bruits no thyromegaly Lungs clear with no wheezing and good diaphragmatic motion Heart:  S1/S2 SEM  murmur, no rub, gallop or click PMI normal Abdomen: benighn, BS positve, no tenderness, no AAA no bruit.  No HSM or HJR Distal pulses intact with no bruits Plus one bilateral  edema Neuro non-focal Skin warm and dry No muscular weakness    EKG:  SR no acute ST changes poor R wave progression    Recent Labs: 12/23/2015: ALT 9; BUN 45; Creatinine, Ser 2.07; Hemoglobin 11.8; Platelets 240.0; Potassium 3.9; Sodium 144; TSH 2.26    Lipid Panel    Component Value Date/Time   CHOL 205 (H) 12/23/2015 0939   TRIG 269.0 (H) 12/23/2015 0939   HDL 42.60 12/23/2015 0939   CHOLHDL 5 12/23/2015 0939   VLDL 53.8 (H) 12/23/2015 0939   LDLCALC 92 09/21/2011 0903   LDLDIRECT 138.0 12/23/2015 0939      Wt Readings from Last 3 Encounters:  01/20/16 71.2 kg (157 lb)  01/18/16 70.8 kg (156 lb)  01/13/16 73.9 kg (163 lb)      Other studies Reviewed: Additional studies/ records that were reviewed today include: Notes DR Hilton Sinclair x 2 duplex ECG and labs Echo.    ASSESSMENT AND PLAN:  1.  Chest Pain atypical myovue with infarct no ischemia not much different than 2015 PRN nitro given age and dementia would not recommend cath 2. Edema continue current dose lasix 3. DVT history of on left distant can consider f/u duplex seeing [facility] 4. HTN  Well controlled.  Continue current medications and low sodium Dash type diet.   5 Leg pain has good pulses in right leg below knee doubt claudication ABI's ordered  6 Carotids 60-79% RICA f/u [facility]    Current medicines are reviewed at length with the patient today.  The patient does not  have concerns regarding medicines.  The  following changes have been made:  no change  Labs/ tests ordered today include: None  No orders of the defined types were placed in this encounter.    Disposition:   FU with Dr Ellyn Hack 3 months      Signed, Nathan Rouge, MD  01/20/2016 10:38 AM    [address], [place], [place]  [address] Phone: [phone]; Fax: [phone]

## 2016-01-17 NOTE — Telephone Encounter (Signed)
Pt's wife calling regarding pt's legs hurting so bad he can hardly sleep, thinks he needs to be seen sooner. He has LE doppler and ABI on 02-01-16, does he needs these tests done before being seen?

## 2016-01-17 NOTE — Telephone Encounter (Signed)
Spoke with wife at length RE: results of stress testing and his need to be seen for further evaluation.  Pt is denying any chest pain/SOB at this time.  Wife feels very uncomfortable waiting until December for his follow up as scheduled.  We were able to find an appointment for 11/16 with Dr Johnsie Cancel and rescheduled pt to that time.  Wife is aware and is very grateful.  She is aware pt will need to keep his appt as scheduled for the evaluation of leg pain and that those results will go to the MD who ordered them.  If pain in legs continues, changes or worsens she is aware to call PCP to discuss or be seen earlier.

## 2016-01-18 ENCOUNTER — Ambulatory Visit (INDEPENDENT_AMBULATORY_CARE_PROVIDER_SITE_OTHER): Payer: Medicare Other | Admitting: Internal Medicine

## 2016-01-18 ENCOUNTER — Encounter: Payer: Self-pay | Admitting: Internal Medicine

## 2016-01-18 VITALS — BP 132/74 | HR 73 | Temp 98.3°F | Resp 20 | Wt 156.0 lb

## 2016-01-18 DIAGNOSIS — R9439 Abnormal result of other cardiovascular function study: Secondary | ICD-10-CM

## 2016-01-18 DIAGNOSIS — N2581 Secondary hyperparathyroidism of renal origin: Secondary | ICD-10-CM | POA: Diagnosis not present

## 2016-01-18 DIAGNOSIS — N184 Chronic kidney disease, stage 4 (severe): Secondary | ICD-10-CM | POA: Diagnosis not present

## 2016-01-18 DIAGNOSIS — H61899 Other specified disorders of external ear, unspecified ear: Secondary | ICD-10-CM

## 2016-01-18 DIAGNOSIS — M25571 Pain in right ankle and joints of right foot: Secondary | ICD-10-CM | POA: Insufficient documentation

## 2016-01-18 DIAGNOSIS — H9390 Unspecified disorder of ear, unspecified ear: Secondary | ICD-10-CM | POA: Insufficient documentation

## 2016-01-18 DIAGNOSIS — L989 Disorder of the skin and subcutaneous tissue, unspecified: Secondary | ICD-10-CM

## 2016-01-18 DIAGNOSIS — I129 Hypertensive chronic kidney disease with stage 1 through stage 4 chronic kidney disease, or unspecified chronic kidney disease: Secondary | ICD-10-CM | POA: Diagnosis not present

## 2016-01-18 DIAGNOSIS — D631 Anemia in chronic kidney disease: Secondary | ICD-10-CM | POA: Diagnosis not present

## 2016-01-18 NOTE — Progress Notes (Signed)
Subjective:    Patient ID: Nathan Blackwell, male    DOB: 1920/03/12, 82 y.o.   MRN: [id number]  HPI  Here to f/u with wife, Pt denies further CP, increased sob or doe, wheezing, orthopnea, PND, increased LE swelling, palpitations, dizziness or syncope.  Pt denies new neurological symptoms such as new headache, or facial or extremity weakness or numbness  Dementia overall stable symptomatically, and not assoc with behavioral changes such as hallucinations, paranoia, or agitation.  Does have raised skin lesion large to right deltoid area, but also a second slightly raised only tan/grey lesion to right pinna.   Did have abnormal stress test, has f/u nov 14 with cardiology.  Also today with pain and swelling to right ankle only with ambulation, new onset without fever, trauma or hx of gout. Past Medical History:  Diagnosis Date  . Anemia    NOS  . ANEMIA-NOS 06/23/2008  . ARTHRITIS 02/23/2007  . B12 deficiency 05/25/2010  . BACK PAIN, THORACIC REGION 06/04/2008  . BENIGN PROSTATIC HYPERTROPHY 10/08/2006  . BRADYCARDIA 06/10/2009  . Cancer of colon ([place]) dx'd 1998  . 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 CLOSTRIDIUM DIFFICILE COLITIS 01/30/2007  . Colitis, Clostridium difficile    presumed 11/08  . COLON CANCER, HX OF 09/20/2006  . COLONIC POLYPS, HX OF 10/08/2006  . CONSTIPATION, RECURRENT 01/06/2010  . Dementia   . DEMENTIA 05/14/2007  . DEPRESSION 02/27/2007  . Diabetes mellitus ([place])   . Diabetes mellitus type II   . DIABETES MELLITUS, TYPE II 09/20/2006  . Diastolic congestive heart failure ([place])   . DVT of lower limb, acute ([place]) 01/12/2011  . Elevated PSA   . GERD 01/14/2007  . GERD (gastroesophageal reflux disease)   . Hyperlipidemia   . HYPERLIPIDEMIA 09/20/2006  . Hypertension   . HYPERTENSION 09/20/2006  . Hypothyroidism   . HYPOTHYROIDISM 01/14/2007  . Hypothyroidism   . 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 Nephrolithiasis   . NEPHROLITHIASIS, HX OF 01/14/2007  . OSA  (obstructive sleep apnea)   . Osteoporosis   . OSTEOPOROSIS 10/08/2006  . PERIPHERAL EDEMA 09/03/2007  . Prostatic hypertrophy    benign  . RENAL INSUFFICIENCY 04/01/2010  . Rotator cuff syndrome    chronic  . Sleep apnea    CPAP dependent  . SLEEP APNEA, OBSTRUCTIVE 01/14/2007  . Thyroid nodule   . THYROID NODULE 01/14/2007  . Vitamin B 12 deficiency    Past Surgical History:  Procedure Laterality Date  . APPENDECTOMY  1998  . CATARACT EXTRACTION, BILATERAL    . CHOLECYSTECTOMY  1999  . COLON RESECTION  1998  . ESOPHAGOGASTRODUODENOSCOPY N/A 12/30/2013   Procedure: ESOPHAGOGASTRODUODENOSCOPY (EGD);  Surgeon: Inda Castle, MD;  [facility];  Service: Endoscopy;  Laterality: N/A;  . ESOPHAGOGASTRODUODENOSCOPY (EGD) WITH PROPOFOL N/A 12/19/2013   Procedure: ESOPHAGOGASTRODUODENOSCOPY (EGD) WITH PROPOFOL;  Surgeon: Inda Castle, MD;  [facility];  Service: Endoscopy;  Laterality: N/A;  . inguinal herniorrhapy  1984  . NM LEXISCAN MYOVIEW LTD  09/02/2013   Normal LV function and wall motion. EF 62%; rate dependent LBBB with Lexiscan, Low Risk fixed inferior defect suggestive of diaphragmatic attenuation and not infarct with normal wall motion.  Marland Kitchen SAVORY DILATION N/A 12/19/2013   Procedure: SAVORY DILATION;  Surgeon: Inda Castle, MD;  [facility];  Service: Endoscopy;  Laterality: N/A;  With Fluoroscopy  . SAVORY DILATION N/A 12/30/2013   Procedure: SAVORY DILATION;  Surgeon: Inda Castle, MD;  [facility];  Service: Endoscopy;  Laterality: N/A;  . skin cancer extraction     right ear-extensive scar  . TRANSTHORACIC ECHOCARDIOGRAM  08/03/2009   Normal LV size. Mild LVH. EF 60-65%. Gr 2 DD, mild MR.    reports that he has never smoked. He has never used smokeless tobacco. He reports that he does not drink alcohol or use drugs. family history includes Diabetes in his mother; Stroke in his father. Allergies  Allergen Reactions  .  Biaxin [Clarithromycin] Other (See Comments)    Unknown   . Other Other (See Comments)    Nephrologist has told patient not to eat many different foods (tomatoes, [employer], etc).   . Sulfa Antibiotics Other (See Comments)    Unknown    Review of Systems  Constitutional: Negative for unusual diaphoresis or night sweats HENT: Negative for ear swelling or discharge Eyes: Negative for worsening visual haziness  Respiratory: Negative for choking and stridor.   Gastrointestinal: Negative for distension or worsening eructation Genitourinary: Negative for retention or change in urine volume.  Musculoskeletal: Negative for other MSK pain or swelling Skin: Negative for color change and worsening wound Neurological: Negative for tremors and numbness other than noted  Psychiatric/Behavioral: Negative for decreased concentration or agitation other than above   All other system neg per pt    Objective:   Physical Exam BP 132/74   Pulse 73   Temp 98.3 F (36.8 C) (Oral)   Resp 20   Wt 156 lb (70.8 kg)   SpO2 94%   BMI 23.04 kg/m  VS noted,  Constitutional: Pt appears in no apparent distress HENT: Head: NCAT.  Right Ear: External ear normal.  Left Ear: External ear normal.  Eyes: . Pupils are equal, round, and reactive to light. Conjunctivae and EOM are normal Neck: Normal range of motion. Neck supple.  Cardiovascular: Normal rate and regular rhythm.   Pulmonary/Chest: Effort normal and breath sounds without rales or wheezing.  Right ankle NT but with 1+ effusion, decreased ROM, foot and leg otherwise neurovasc intact Neurological: Pt is alert. At baseline confused , motor grossly intact Skin: Skin is warm. No rash, no LE edema, right deltoid lesion approx 1 cm NT, slightly raised grey somewhat scaly; also right pinna with upper post aspect < 1/2 cm minimally raised grey NT lesion Psychiatric: Pt behavior is normal. No agitation.     Assessment & Plan:

## 2016-01-18 NOTE — Progress Notes (Signed)
Pre visit review using our clinic review tool, if applicable. No additional management support is needed unless otherwise documented below in the visit note. 

## 2016-01-18 NOTE — Patient Instructions (Signed)
Please continue all other medications as before, and refills have been done if requested.  Please have the pharmacy call with any other refills you may need.  Please continue your efforts at being more active, low cholesterol diet, and weight control.  You are otherwise up to date with prevention measures today.  Please keep your appointments with your specialists as you may have planned - Dr Johnsie Cancel on Nov 16 (thur)  You will be contacted regarding the referral for: Dr Delman Cheadle, and Dr Tamala Julian

## 2016-01-20 ENCOUNTER — Encounter: Payer: Self-pay | Admitting: Cardiovascular Disease

## 2016-01-20 ENCOUNTER — Other Ambulatory Visit: Payer: Self-pay | Admitting: Internal Medicine

## 2016-01-20 ENCOUNTER — Ambulatory Visit (INDEPENDENT_AMBULATORY_CARE_PROVIDER_SITE_OTHER): Payer: Medicare Other | Admitting: Cardiovascular Disease

## 2016-01-20 VITALS — BP 142/60 | HR 79 | Ht 69.5 in | Wt 157.0 lb

## 2016-01-20 DIAGNOSIS — G3183 Dementia with Lewy bodies: Principal | ICD-10-CM

## 2016-01-20 DIAGNOSIS — Z7689 Persons encountering health services in other specified circumstances: Secondary | ICD-10-CM | POA: Diagnosis not present

## 2016-01-20 DIAGNOSIS — F0280 Dementia in other diseases classified elsewhere without behavioral disturbance: Secondary | ICD-10-CM

## 2016-01-20 DIAGNOSIS — I5032 Chronic diastolic (congestive) heart failure: Secondary | ICD-10-CM

## 2016-01-20 NOTE — Patient Instructions (Signed)
Medication Instructions:  Your physician recommends that you continue on your current medications as directed. Please refer to the Current Medication list given to you today.  Labwork: NONE  Testing/Procedures: NONE  Follow-Up: Your physician wants you to follow-up in: 3 months with Dr. Ellyn Hack.    If you need a refill on your cardiac medications before your next appointment, please call your pharmacy.

## 2016-01-21 ENCOUNTER — Other Ambulatory Visit: Payer: Self-pay

## 2016-01-21 NOTE — Patient Outreach (Signed)
[facility]) Care Management  01/21/2016  Nathan Blackwell 12/25/1920 [id number]      Telephone Screen  Referral Date: 01/21/16 Referral Source: MD office(Dr. Jenny Reichmann) Referral Reason: "dementia, gait disorder, TIA, HF"    Outreach attempt # 1  to patient. No answer at present and unable to leave message.     Plan: RN CM will make outreach attempt to patient within a week.  [name], RN,BSN,CCM [place] Management Telephonic Care Management Coordinator Direct Phone: [phone] Toll Free: [phone] Fax: [phone]

## 2016-01-23 NOTE — Assessment & Plan Note (Signed)
Has f/u scheduled with cardiology

## 2016-01-23 NOTE — Assessment & Plan Note (Signed)
Etiology unclear, ? Skin Malignancy - for derm referral

## 2016-01-23 NOTE — Assessment & Plan Note (Signed)
With mild effusion, likely djd related, for tylenol prn, refer Dr Smith/sport medicine

## 2016-01-24 ENCOUNTER — Other Ambulatory Visit: Payer: Self-pay

## 2016-01-24 NOTE — Patient Outreach (Signed)
[facility]) Care Management  01/24/2016  Nathan Blackwell Jul 29, 1920 [id number]   Telephone Screen  Referral Date: 01/21/16 Referral Source: MD office(Dr. Jenny Reichmann) Referral Reason: "dementia, gait disorder, TIA, HF"     Outreach attempt #2 to patient. No answer at present. RN CM left HIPAA compliant voicemail message along with contact info.      Plan: RN CM will make outreach attempt to patient within a week if no return call from patient.  [name], RN,BSN,CCM [name] Management Telephonic Care Management Coordinator Direct Phone: [phone] Toll Free: [phone] Fax: [phone]

## 2016-01-24 NOTE — Patient Outreach (Signed)
[facility]) Care Management  01/24/2016  Nathan Blackwell 15-Aug-1920 [id number]   Telephone Screen  Referral Date: 01/21/16 Referral Source: MD office(Dr. Jenny Blackwell) Referral Reason: "dementia, gait disorder, TIA, HF"  Voicemail message received from spouse. Return call placed to spouse. Screening completed.  Social: Patient resides in the home along with his spouse and adult dtr. He is independent with ADLs and most IADLs. Spouse drives patient to appts as he stopped driving over [id number] ago. Denies any falls within the last year. DME in the home include walker, BP monitor and cbg meter.  Conditions: Patient has PMH of anemia, arthritis, colon [id number]), dementia, depression, diabetes, CHF, GERD, HTN, HLD and renal insufficiency. Patient monitoring cbgs in the home daily. Spouse unable to recall values but states it's "normal." Last A1C 7.2(Oct 2017). He has BP monitor and is checking BP regularly at home and reported as being WNL. Spouse reports that patient spends most of the day sitting and sleeping in his recliner chair.   Medications: Per spouse patient taking about 14 meds. Denies any issues with affording and/or managing meds.  Appointments: Patient saw PCP on last week.  Flu vaccine: Per spouse he received it last month.  Advance Directives: Patient has living will.  Consent: Discussed and reviewed [facility] services. Spouse denies any RN,SW or pharmacy needs at this time. However, she was very appreciative of knowing about [facility] services in case the need arises in the future. They are agreeable to receiving additional [facility] info in the mail for future reference.  Plan: RN CM will notify [facility] administrative assistant of case status. RN CM will send Patient [facility] letter, brochure and magnet. RN CM will send MD case closure letter.    [name], RN,BSN,CCM [place] Management Telephonic Care Management Coordinator Direct Phone: [phone] Toll Free:  [phone] Fax: [phone]

## 2016-01-25 ENCOUNTER — Other Ambulatory Visit: Payer: Self-pay | Admitting: Internal Medicine

## 2016-01-31 ENCOUNTER — Ambulatory Visit: Payer: Self-pay

## 2016-02-01 ENCOUNTER — Ambulatory Visit (HOSPITAL_COMMUNITY)
Admission: RE | Admit: 2016-02-01 | Discharge: 2016-02-01 | Disposition: A | Discharge status: Home / Self Care | Payer: Medicare Other | Source: Ambulatory Visit | Attending: Vascular Surgery | Admitting: Vascular Surgery

## 2016-02-01 ENCOUNTER — Ambulatory Visit (HOSPITAL_COMMUNITY)
Admission: RE | Admit: 2016-02-01 | Discharge: 2016-02-01 | Disposition: A | Discharge status: Home / Self Care | Payer: Medicare Other | Source: Ambulatory Visit | Attending: Internal Medicine | Admitting: Internal Medicine

## 2016-02-01 DIAGNOSIS — M79604 Pain in right leg: Secondary | ICD-10-CM

## 2016-02-01 DIAGNOSIS — E119 Type 2 diabetes mellitus without complications: Secondary | ICD-10-CM | POA: Insufficient documentation

## 2016-02-01 DIAGNOSIS — L281 Prurigo nodularis: Secondary | ICD-10-CM | POA: Diagnosis not present

## 2016-02-01 DIAGNOSIS — Z23 Encounter for immunization: Secondary | ICD-10-CM | POA: Diagnosis not present

## 2016-02-01 DIAGNOSIS — D485 Neoplasm of uncertain behavior of skin: Secondary | ICD-10-CM | POA: Diagnosis not present

## 2016-02-04 ENCOUNTER — Ambulatory Visit (INDEPENDENT_AMBULATORY_CARE_PROVIDER_SITE_OTHER): Payer: Medicare Other | Admitting: Internal Medicine

## 2016-02-04 ENCOUNTER — Encounter: Payer: Self-pay | Admitting: Internal Medicine

## 2016-02-04 VITALS — BP 140/80 | HR 89 | Temp 98.0°F | Resp 20 | Wt 156.0 lb

## 2016-02-04 DIAGNOSIS — I10 Essential (primary) hypertension: Secondary | ICD-10-CM

## 2016-02-04 DIAGNOSIS — M25562 Pain in left knee: Secondary | ICD-10-CM | POA: Diagnosis not present

## 2016-02-04 DIAGNOSIS — I5032 Chronic diastolic (congestive) heart failure: Secondary | ICD-10-CM

## 2016-02-04 NOTE — Patient Instructions (Addendum)
Please continue all other medications as before, and refills have been done if requested.  Please have the pharmacy call with any other refills you may need.  Please keep your appointments with your specialists as you may have planned  Please be sure to see Dr Tamala Julian as planned  Please return in 6 months, or sooner if needed

## 2016-02-04 NOTE — Progress Notes (Signed)
Pre visit review using our clinic review tool, if applicable. No additional management support is needed unless otherwise documented below in the visit note. 

## 2016-02-05 DIAGNOSIS — M25562 Pain in left knee: Secondary | ICD-10-CM | POA: Insufficient documentation

## 2016-02-05 NOTE — Assessment & Plan Note (Signed)
Has left knee DJD by exam, likely mod to severe, but with ? Bakers cyst, declines knee u/s, will accept referral to Dr Smith/sport medicine, cont same med tx,  to f/u any worsening symptoms or concerns

## 2016-02-05 NOTE — Assessment & Plan Note (Signed)
stable overall by history and exam, recent data reviewed with pt, and pt to continue medical treatment as before,  to f/u any worsening symptoms or concerns BP Readings from Last 3 Encounters:  02/04/16 140/80  01/20/16 (!) 142/60  01/18/16 132/74

## 2016-02-05 NOTE — Progress Notes (Signed)
Subjective:    Patient ID: Nathan Blackwell, male    DOB: 30-Oct-1920, 82 y.o.   MRN: [id number]  HPI  Here to f/u; overall doing ok,  Pt denies chest pain, increasing sob or doe, wheezing, orthopnea, PND, increased LE swelling, palpitations, dizziness or syncope.  Pt denies new neurological symptoms such as new headache, or facial or extremity weakness or numbness.  Pt denies polydipsia, polyuria, or low sugar episode.   Pt denies new neurological symptoms such as new headache, or facial or extremity weakness or numbness.   Pt states overall good compliance with meds, mostly trying to follow appropriate diet, with wt overall stable,  but little exercise however.  Dementia overall stable symptomatically, and not assoc with behavioral changes such as hallucinations, paranoia, or agitation. Pt now states pain is primarily to post left knee x several months without overt knee swelling but has known degenerative changes.  No giveaways or falls and bilat LE swelling not worsening.   Past Medical History:  Diagnosis Date  . Anemia    NOS  . ANEMIA-NOS 06/23/2008  . ARTHRITIS 02/23/2007  . B12 deficiency 05/25/2010  . BACK PAIN, THORACIC REGION 06/04/2008  . BENIGN PROSTATIC HYPERTROPHY 10/08/2006  . BRADYCARDIA 06/10/2009  . Cancer of colon ([place]) dx'd 1998  . 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 CLOSTRIDIUM DIFFICILE COLITIS 01/30/2007  . Colitis, Clostridium difficile    presumed 11/08  . COLON CANCER, HX OF 09/20/2006  . COLONIC POLYPS, HX OF 10/08/2006  . CONSTIPATION, RECURRENT 01/06/2010  . Dementia   . DEMENTIA 05/14/2007  . DEPRESSION 02/27/2007  . Diabetes mellitus ([place])   . Diabetes mellitus type II   . DIABETES MELLITUS, TYPE II 09/20/2006  . Diastolic congestive heart failure ([place])   . DVT of lower limb, acute ([place]) 01/12/2011  . Elevated PSA   . GERD 01/14/2007  . GERD (gastroesophageal reflux disease)   . Hyperlipidemia   . HYPERLIPIDEMIA 09/20/2006  . Hypertension   . HYPERTENSION  09/20/2006  . Hypothyroidism   . HYPOTHYROIDISM 01/14/2007  . Hypothyroidism   . 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 Nephrolithiasis   . NEPHROLITHIASIS, HX OF 01/14/2007  . OSA (obstructive sleep apnea)   . Osteoporosis   . OSTEOPOROSIS 10/08/2006  . PERIPHERAL EDEMA 09/03/2007  . Prostatic hypertrophy    benign  . RENAL INSUFFICIENCY 04/01/2010  . Rotator cuff syndrome    chronic  . Sleep apnea    CPAP dependent  . SLEEP APNEA, OBSTRUCTIVE 01/14/2007  . Thyroid nodule   . THYROID NODULE 01/14/2007  . Vitamin B 12 deficiency    Past Surgical History:  Procedure Laterality Date  . APPENDECTOMY  1998  . CATARACT EXTRACTION, BILATERAL    . CHOLECYSTECTOMY  1999  . COLON RESECTION  1998  . ESOPHAGOGASTRODUODENOSCOPY N/A 12/30/2013   Procedure: ESOPHAGOGASTRODUODENOSCOPY (EGD);  Surgeon: Inda Castle, MD;  Location: [facility];  Service: Endoscopy;  Laterality: N/A;  . ESOPHAGOGASTRODUODENOSCOPY (EGD) WITH PROPOFOL N/A 12/19/2013   Procedure: ESOPHAGOGASTRODUODENOSCOPY (EGD) WITH PROPOFOL;  Surgeon: Inda Castle, MD;  Location: [facility];  Service: Endoscopy;  Laterality: N/A;  . inguinal herniorrhapy  1984  . NM LEXISCAN MYOVIEW LTD  09/02/2013   Normal LV function and wall motion. EF 62%; rate dependent LBBB with Lexiscan, Low Risk fixed inferior defect suggestive of diaphragmatic attenuation and not infarct with normal wall motion.  Marland Kitchen SAVORY DILATION N/A 12/19/2013   Procedure: SAVORY DILATION;  Surgeon: Inda Castle, MD;  Location: [facility];  Service: Endoscopy;  Laterality: N/A;  With Fluoroscopy  . SAVORY DILATION N/A 12/30/2013   Procedure: SAVORY DILATION;  Surgeon: Inda Castle, MD;  Location: [facility];  Service: Endoscopy;  Laterality: N/A;  . skin cancer extraction     right ear-extensive scar  . TRANSTHORACIC ECHOCARDIOGRAM  08/03/2009   Normal LV size. Mild LVH. EF 60-65%. Gr 2 DD, mild MR.    reports that he has never smoked. He has never  used smokeless tobacco. He reports that he does not drink alcohol or use drugs. family history includes Diabetes in his mother; Stroke in his father. Allergies  Allergen Reactions  . Biaxin [Clarithromycin] Other (See Comments)    Unknown   . Other Other (See Comments)    Nephrologist has told patient not to eat many different foods (tomatoes, Green Foods, etc).   . Sulfa Antibiotics Other (See Comments)    Unknown    Current Outpatient Prescriptions on File Prior to Visit  Medication Sig Dispense Refill  . aspirin EC 81 MG EC tablet Take 1 tablet (81 mg total) by mouth daily. 30 tablet 0  . bisacodyl (DULCOLAX) 10 MG suppository Place 1 suppository (10 mg total) rectally daily as needed for moderate constipation. 12 suppository 0  . cholecalciferol (VITAMIN D) 1000 UNITS tablet Take 1,000 Units by mouth every morning.     . citalopram (CELEXA) 20 MG tablet TAKE 1 TABLET BY MOUTH EVERY MORNING 90 tablet 0  . CREON 12000 units CPEP capsule TAKE 1 CAPSULE BY MOUTH THREE TIMES DAILY WITH MEALS 300 capsule 2  . famotidine (PEPCID) 20 MG tablet Take 1 tablet (20 mg total) by mouth daily. 30 tablet 0  . feeding supplement, ENSURE ENLIVE, (ENSURE ENLIVE) LIQD Take 237 mLs by mouth 2 (two) times daily between meals. 237 mL 12  . furosemide (LASIX) 80 MG tablet TAKE 1 TABLET BY MOUTH TWICE DAILY 180 tablet 0  . hydrALAZINE (APRESOLINE) 25 MG tablet Take 1 tablet (25 mg total) by mouth 3 (three) times daily. 90 tablet 0  . isosorbide mononitrate (IMDUR) 30 MG 24 hr tablet TAKE 1 TABLET BY MOUTH EVERY MORNING 90 tablet 1  . JANUVIA 50 MG tablet TAKE 1 TABLET (50 MG TOTAL) BY MOUTH EVERY MORNING. 90 tablet 1  . lansoprazole (PREVACID) 30 MG capsule TAKE 1 CAPSULE BY MOUTH EVERY MORNING 90 capsule 1  . meclizine (ANTIVERT) 12.5 MG tablet TAKE 1 TABLET BY MOUTH 3 TIMES A DAY AS NEEDED FOR DIZZINESS 30 tablet 5  . meloxicam (MOBIC) 7.5 MG tablet TAKE 1 TABLET BY MOUTH EVERY DAY 30 tablet 0  .  OLANZapine (ZYPREXA) 5 MG tablet TAKE 1 TABLET BY MOUTH EVERY MORNING 90 tablet 0  . ondansetron (ZOFRAN) 4 MG tablet Take 1 tablet (4 mg total) by mouth every 8 (eight) hours as needed for nausea or vomiting. 20 tablet 0  . ONE TOUCH ULTRA TEST test strip USE TO CHECK BLOOD SUGAR ONCE A DAY AS DIRECTED 100 each 11  . potassium chloride (K-DUR) 10 MEQ tablet Take 1 tablet (10 mEq total) by mouth daily. 30 tablet 0  . QUEtiapine (SEROQUEL) 50 MG tablet TAKE 1 TABLET BY MOUTH DAILY AT BEDTIME 90 tablet 0  . SYNTHROID 100 MCG tablet TAKE 1 TABLET BY MOUTH DAILY BEFORE BREAKFAST 90 tablet 1  . tamsulosin (FLOMAX) 0.4 MG CAPS capsule TAKE 1 CAPSULE BY MOUTH AT BEDTIME 90 capsule 1  . tiZANidine (ZANAFLEX) 4 MG tablet Take 1  tablet (4 mg total) by mouth every 6 (six) hours as needed for muscle spasms. 30 tablet 1  . traMADol (ULTRAM) 50 MG tablet Take 1 tablet (50 mg total) by mouth every 8 (eight) hours as needed. 60 tablet 2   No current facility-administered medications on file prior to visit.    Review of Systems  Constitutional: Negative for unusual diaphoresis or night sweats HENT: Negative for ear swelling or discharge Eyes: Negative for worsening visual haziness  Respiratory: Negative for choking and stridor.   Gastrointestinal: Negative for distension or worsening eructation Genitourinary: Negative for retention or change in urine volume.  Musculoskeletal: Negative for other MSK pain or swelling Skin: Negative for color change and worsening wound Neurological: Negative for tremors and numbness other than noted  Psychiatric/Behavioral: Negative for decreased concentration or agitation other than above   All other system neg per pt    Objective:   Physical Exam BP 140/80   Pulse 89   Temp 98 F (36.7 C) (Oral)   Resp 20   Wt 156 lb (70.8 kg)   SpO2 92%   BMI 22.71 kg/m  VS noted,  Constitutional: Pt appears in no apparent distress HENT: Head: NCAT.  Right Ear: External ear  normal.  Left Ear: External ear normal.  Eyes: . Pupils are equal, round, and reactive to light. Conjunctivae and EOM are normal Neck: Normal range of motion. Neck supple.  Cardiovascular: Normal rate and regular rhythm.   Pulmonary/Chest: Effort normal and breath sounds without rales or wheezing.  Neurological: Pt is alert. At baseline confused , motor grossly intact Skin: Skin is warm. No rash, stable trace to 1+ bilat LE edema to near knees Left knee with bony degenerative changes, and post knee tender fullness -  ? Cyst like but no specific mass or tendon involvement I can appreciate Psychiatric: Pt behavior is normal. No agitation.     Assessment & Plan:

## 2016-02-05 NOTE — Assessment & Plan Note (Signed)
stable overall by history and exam, recent data reviewed with pt, and pt to continue medical treatment as before,  to f/u any worsening symptoms or concerns Lab Results  Component Value Date   WBC 9.0 12/23/2015   HGB 11.8 (L) 12/23/2015   HCT 35.5 (L) 12/23/2015   PLT 240.0 12/23/2015   GLUCOSE 236 (H) 12/23/2015   CHOL 205 (H) 12/23/2015   TRIG 269.0 (H) 12/23/2015   HDL 42.60 12/23/2015   LDLDIRECT 138.0 12/23/2015   LDLCALC 92 09/21/2011   ALT 9 12/23/2015   AST 14 12/23/2015   NA 144 12/23/2015   K 3.9 12/23/2015   CL 103 12/23/2015   CREATININE 2.07 (H) 12/23/2015   BUN 45 (H) 12/23/2015   CO2 29 12/23/2015   TSH 2.26 12/23/2015   PSA 4.88 (H) 01/14/2007   INR 1.12 08/18/2014   HGBA1C 7.2 (H) 12/23/2015   MICROALBUR 5.2 (H) 12/23/2015

## 2016-02-06 ENCOUNTER — Other Ambulatory Visit: Payer: Self-pay | Admitting: Internal Medicine

## 2016-02-08 ENCOUNTER — Ambulatory Visit: Payer: Medicare Other | Admitting: Cardiology
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
## 2016-02-17 ENCOUNTER — Encounter: Payer: Self-pay | Admitting: Family Medicine

## 2016-02-17 ENCOUNTER — Ambulatory Visit (INDEPENDENT_AMBULATORY_CARE_PROVIDER_SITE_OTHER): Payer: Medicare Other | Admitting: Family Medicine

## 2016-02-17 DIAGNOSIS — M1712 Unilateral primary osteoarthritis, left knee: Secondary | ICD-10-CM | POA: Diagnosis not present

## 2016-02-17 MED ORDER — GABAPENTIN 100 MG PO CAPS: 100.0000 mg | ORAL_CAPSULE | Freq: Every day | ORAL | 3 refills | Status: DC

## 2016-02-17 NOTE — Assessment & Plan Note (Signed)
Patient given injection and tolerated the procedure well. We discussed icing regimen and home exercises. We discussed which activities doing which ones to avoid. Patient will given trial of topical anti-inflammatories and warned the potential side effect. Worsening symptoms he could be a candidate for viscous supplementation. Patient is not a candidate for replacement. Encourage patient to follow-up with primary care provider as well due to the edema noted.

## 2016-02-17 NOTE — Patient Instructions (Signed)
Good to see you  Ice 20 minutes 2 times daily. Usually after activity and before bed. Injected knee today  Gabapentin 100-200mg  at night Iron 65mg  with 500mg  of vitamin C 3 times a week may help with energy and the night time pain  Try to put a pillow under the feet as well with sleeping to help with the swelling See me again in 4 weeks just in case Happy holidays!

## 2016-02-29 NOTE — Progress Notes (Signed)
Nathan Blackwell, [place] [address] Phone: [phone] Subjective:     CC: Right ankle pain  Left knee pain f/u  [id number]  Nathan Blackwell is a 82 y.o. male coming in with complaint of right ankle pain  Patient was seen by primary care provider. Patient did have mild effusion at that time. Patient was seen by me diagnosed with more arthritic changes as well as dependent edema.Continues to have swelling.  Left knee does have arthritic changes. Was given injection 2 weeks ago. Work for proximal he one week and pain is worsened again. Having increasing instability.      Past Medical History:  Diagnosis Date  . Anemia    NOS  . ANEMIA-NOS 06/23/2008  . ARTHRITIS 02/23/2007  . B12 deficiency 05/25/2010  . BACK PAIN, THORACIC REGION 06/04/2008  . BENIGN PROSTATIC HYPERTROPHY 10/08/2006  . BRADYCARDIA 06/10/2009  . Cancer of colon ([place]) dx'd 1998  . 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 CLOSTRIDIUM DIFFICILE COLITIS 01/30/2007  . Colitis, Clostridium difficile    presumed 11/08  . COLON CANCER, HX OF 09/20/2006  . COLONIC POLYPS, HX OF 10/08/2006  . CONSTIPATION, RECURRENT 01/06/2010  . Dementia   . DEMENTIA 05/14/2007  . DEPRESSION 02/27/2007  . Diabetes mellitus ([place])   . Diabetes mellitus type II   . DIABETES MELLITUS, TYPE II 09/20/2006  . Diastolic congestive heart failure ([place])   . DVT of lower limb, acute ([place]) 01/12/2011  . Elevated PSA   . GERD 01/14/2007  . GERD (gastroesophageal reflux disease)   . Hyperlipidemia   . HYPERLIPIDEMIA 09/20/2006  . Hypertension   . HYPERTENSION 09/20/2006  . Hypothyroidism   . HYPOTHYROIDISM 01/14/2007  . Hypothyroidism   . 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 Nephrolithiasis   . NEPHROLITHIASIS, HX OF 01/14/2007  . OSA (obstructive sleep apnea)   . Osteoporosis   . OSTEOPOROSIS 10/08/2006  . PERIPHERAL EDEMA 09/03/2007  . Prostatic hypertrophy    benign  . RENAL INSUFFICIENCY 04/01/2010   . Rotator cuff syndrome    chronic  . Sleep apnea    CPAP dependent  . SLEEP APNEA, OBSTRUCTIVE 01/14/2007  . Thyroid nodule   . THYROID NODULE 01/14/2007  . Vitamin B 12 deficiency    Past Surgical History:  Procedure Laterality Date  . APPENDECTOMY  1998  . CATARACT EXTRACTION, BILATERAL    . CHOLECYSTECTOMY  1999  . COLON RESECTION  1998  . ESOPHAGOGASTRODUODENOSCOPY N/A 12/30/2013   Procedure: ESOPHAGOGASTRODUODENOSCOPY (EGD);  Surgeon: Inda Castle, MD;  Location: [facility];  Service: Endoscopy;  Laterality: N/A;  . ESOPHAGOGASTRODUODENOSCOPY (EGD) WITH PROPOFOL N/A 12/19/2013   Procedure: ESOPHAGOGASTRODUODENOSCOPY (EGD) WITH PROPOFOL;  Surgeon: Inda Castle, MD;  Location: [facility];  Service: Endoscopy;  Laterality: N/A;  . inguinal herniorrhapy  1984  . NM LEXISCAN MYOVIEW LTD  09/02/2013   Normal LV function and wall motion. EF 62%; rate dependent LBBB with Lexiscan, Low Risk fixed inferior defect suggestive of diaphragmatic attenuation and not infarct with normal wall motion.  Marland Kitchen SAVORY DILATION N/A 12/19/2013   Procedure: SAVORY DILATION;  Surgeon: Inda Castle, MD;  Location: [facility];  Service: Endoscopy;  Laterality: N/A;  With Fluoroscopy  . SAVORY DILATION N/A 12/30/2013   Procedure: SAVORY DILATION;  Surgeon: Inda Castle, MD;  Location: [facility];  Service: Endoscopy;  Laterality: N/A;  . skin cancer extraction     right ear-extensive scar  . TRANSTHORACIC  ECHOCARDIOGRAM  08/03/2009   Normal LV size. Mild LVH. EF 60-65%. Gr 2 DD, mild MR.   Social History   Social History  . Marital status: Married    Spouse name: N/A  . Number of children: 2  . Years of education: N/A   Occupational History  . retired Retired    Therapist, sports  .      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   Social History Main Topics  . Smoking status: Never Smoker  . Smokeless tobacco: Never  Used  . Alcohol use No  . Drug use: No  . Sexual activity: No   Other Topics Concern  . None   Social History Narrative   He and his wife Nathan Blackwell are the process of moving to an independent living center - [facility]. This will allow them to have 3 meals a day and available activities they come along with being in a retirement community.   He never smoked. Does not take alcohol.   He is a retired Scientist, product/process development.   Family history of Alcoholism/Addiction.   Allergies  Allergen Reactions  . Biaxin [Clarithromycin] Other (See Comments)    Unknown   . Other Other (See Comments)    Nephrologist has told patient not to eat many different foods (tomatoes, Green Foods, etc).   . Sulfa Antibiotics Other (See Comments)    Unknown    Family History  Problem Relation Age of Onset  . Stroke Father   . Diabetes Mother     Past medical history, social, surgical and family history all reviewed in electronic medical record.  No pertanent information unless stated regarding to the chief complaint.   Review of Systems: No headache, visual changes, nausea, vomiting, diarrhea, constipation, dizziness, abdominal pain, skin rash, fevers, chills, night sweats, weight loss, swollen lymph nodes, body aches, joint swelling, muscle aches, chest pain, shortness of breath, mood changes. Does have difficult he with memory.   Objective  Blood pressure 136/64, pulse 81, height 5' 9.5" (1.765 m), weight 155 lb (70.3 kg), SpO2 98 %. Systems examined below as of 03/01/16   General: No apparent distress alert and oriented x2 mood and affect normal, dressed appropriately. Elderly HEENT: Pupils equal, extraocular movements intact  Respiratory: Patient's speak in full sentences but is moderately short of breath at rest Cardiovascular: 2+ lower extremity edema, non tender, no erythema  Skin: Warm dry intact with no signs of infection or rash on extremities or on axial skeleton.  Abdomen: Soft nontender  Neuro:  Cranial nerves II through XII are intact, neurovascularly intact in all extremities with 2+ DTRs and 1+ pulses but symmetric.  Lymph: No lymphadenopathy of posterior or anterior cervical chain or axillae bilaterally.  Gait antalgic slow shuffling gait. Uses the aid of a walker MSK:  Non tender with full range of motion and good stability and symmetric strength and tone of shoulders, elbows, wrist, hip, and bilaterally. Severe arthritic of multiple joints Ankle: Right 2+ pitting edema up to midcalf bilaterally no change  Range of motion is symmetric Strength is 5/5 in all directions. Stable lateral and medial ligaments; squeeze test and kleiger test unremarkable; Talar dome nontender; No pain at base of 5th MT; No tenderness over cuboid; No tenderness over N spot or navicular prominence No tenderness on posterior aspects of lateral and medial malleolus No sign of peroneal tendon subluxations or tenderness to palpation Negative tarsal tunnel tinel's Able to  walk 4 steps.  Knee: Left Muscle atrophy noted with mild valgus deformity effusion noted of the left knee Tender to palpation over the medial joint line Lacks last 5 of flexion and extension Instability noted with valgus force Negative Mcmurray's, Apley's, and Thessalonian tests. Non painful patellar compression. Patellar glide without crepitus. Patellar and quadriceps tendons unremarkable. Hamstring and quadriceps is weak but symmetric. Arthritic changes of the right knee but nontender No change from previous exam.  After informed written and verbal consent, patient was seated on exam table. Left knee was prepped with alcohol swab and utilizing anterolateral approach, patient's left knee space was injected with a prefilled Monovisc syringe. Used a 21-gauge 2 inch chain or. Patient tolerated the procedure well without immediate complications.     Impression and Recommendations:     This case required medical decision making of  moderate complexity.      Note: This dictation was prepared with Dragon dictation along with smaller phrase technology. Any transcriptional errors that result from this process are unintentional.

## 2016-03-01 ENCOUNTER — Encounter: Payer: Self-pay | Admitting: Family Medicine

## 2016-03-01 ENCOUNTER — Ambulatory Visit (INDEPENDENT_AMBULATORY_CARE_PROVIDER_SITE_OTHER): Payer: Medicare Other | Admitting: Family Medicine

## 2016-03-01 DIAGNOSIS — M1712 Unilateral primary osteoarthritis, left knee: Secondary | ICD-10-CM

## 2016-03-01 NOTE — Assessment & Plan Note (Signed)
Given Monovisc today. Patient failed all other conservative therapy. Not a surgical candidate secondary to patient's other health risks. Patient responds hopefully will be well to this. Discussed custom brace which they will consider. Continue the aid of a walker. Can try topical anti-inflammatories if needed. Icing regimen. Follow-up again in 4 weeks.

## 2016-03-01 NOTE — Patient Instructions (Addendum)
Good to see you  Nathan Blackwell is your friend.  Stay active.  We can consider a custom brace as well.  We injected you with monovisc today and can take up to 2 weeks to work.  This we can do every 6 months.  Stay active.  See me again in 4 weeks  For your better half please get wart removing cream and apply daily for next week with you covering with a band aid every day

## 2016-03-05 ENCOUNTER — Other Ambulatory Visit: Payer: Self-pay | Admitting: Internal Medicine

## 2016-03-07 DIAGNOSIS — L281 Prurigo nodularis: Secondary | ICD-10-CM | POA: Diagnosis not present

## 2016-03-07 DIAGNOSIS — L219 Seborrheic dermatitis, unspecified: Secondary | ICD-10-CM | POA: Diagnosis not present

## 2016-03-14 ENCOUNTER — Ambulatory Visit (INDEPENDENT_AMBULATORY_CARE_PROVIDER_SITE_OTHER): Payer: Medicare Other | Admitting: Family Medicine

## 2016-03-14 ENCOUNTER — Encounter: Payer: Self-pay | Admitting: Family Medicine

## 2016-03-14 DIAGNOSIS — M1712 Unilateral primary osteoarthritis, left knee: Secondary | ICD-10-CM | POA: Diagnosis not present

## 2016-03-14 DIAGNOSIS — L602 Onychogryphosis: Secondary | ICD-10-CM | POA: Diagnosis not present

## 2016-03-14 DIAGNOSIS — E1351 Other specified diabetes mellitus with diabetic peripheral angiopathy without gangrene: Secondary | ICD-10-CM | POA: Diagnosis not present

## 2016-03-14 NOTE — Progress Notes (Signed)
[facility] [place], [place] [address] Phone: [phone] Subjective:     CC: Right ankle pain  Left knee pain f/u  [id number]  Nathan Blackwell is a 83 y.o. male coming in with complaint of right ankle pain  Patient was seen by primary care provider.   Left knee does have arthritic changes. Patient was given viscous supplementation to half weeks ago. Patient was to continue with conservative therapy. Patient states Possibly worsening symptoms at this time. Patient states that he is having increasing instability. Patient is accompanied with daughter today stating that she did not know what the plan was in with the treatment options were. Patient's wife does have Alzheimer's. Patient has been trying to remain active but is finding it difficult secondary to pain. Seems to be going down both legs as well. Has had significant pedal edema previously.      Past Medical History:  Diagnosis Date  . Anemia    NOS  . ANEMIA-NOS 06/23/2008  . ARTHRITIS 02/23/2007  . B12 deficiency 05/25/2010  . BACK PAIN, THORACIC REGION 06/04/2008  . BENIGN PROSTATIC HYPERTROPHY 10/08/2006  . BRADYCARDIA 06/10/2009  . Cancer of colon ([place]) dx'd 1998  . 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 CLOSTRIDIUM DIFFICILE COLITIS 01/30/2007  . Colitis, Clostridium difficile    presumed 11/08  . COLON CANCER, HX OF 09/20/2006  . COLONIC POLYPS, HX OF 10/08/2006  . CONSTIPATION, RECURRENT 01/06/2010  . Dementia   . DEMENTIA 05/14/2007  . DEPRESSION 02/27/2007  . Diabetes mellitus ([place])   . Diabetes mellitus type II   . DIABETES MELLITUS, TYPE II 09/20/2006  . Diastolic congestive heart failure ([place])   . DVT of lower limb, acute ([place]) 01/12/2011  . Elevated PSA   . GERD 01/14/2007  . GERD (gastroesophageal reflux disease)   . Hyperlipidemia   . HYPERLIPIDEMIA 09/20/2006  . Hypertension   . HYPERTENSION 09/20/2006  . Hypothyroidism   . HYPOTHYROIDISM 01/14/2007  .  Hypothyroidism   . 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 Nephrolithiasis   . NEPHROLITHIASIS, HX OF 01/14/2007  . OSA (obstructive sleep apnea)   . Osteoporosis   . OSTEOPOROSIS 10/08/2006  . PERIPHERAL EDEMA 09/03/2007  . Prostatic hypertrophy    benign  . RENAL INSUFFICIENCY 04/01/2010  . Rotator cuff syndrome    chronic  . Sleep apnea    CPAP dependent  . SLEEP APNEA, OBSTRUCTIVE 01/14/2007  . Thyroid nodule   . THYROID NODULE 01/14/2007  . Vitamin B 12 deficiency    Past Surgical History:  Procedure Laterality Date  . APPENDECTOMY  1998  . CATARACT EXTRACTION, BILATERAL    . CHOLECYSTECTOMY  1999  . COLON RESECTION  1998  . ESOPHAGOGASTRODUODENOSCOPY N/A 12/30/2013   Procedure: ESOPHAGOGASTRODUODENOSCOPY (EGD);  Surgeon: Inda Castle, MD;  Location: [facility];  Service: Endoscopy;  Laterality: N/A;  . ESOPHAGOGASTRODUODENOSCOPY (EGD) WITH PROPOFOL N/A 12/19/2013   Procedure: ESOPHAGOGASTRODUODENOSCOPY (EGD) WITH PROPOFOL;  Surgeon: Inda Castle, MD;  Location: [facility];  Service: Endoscopy;  Laterality: N/A;  . inguinal herniorrhapy  1984  . NM LEXISCAN MYOVIEW LTD  09/02/2013   Normal LV function and wall motion. EF 62%; rate dependent LBBB with Lexiscan, Low Risk fixed inferior defect suggestive of diaphragmatic attenuation and not infarct with normal wall motion.  Marland Kitchen SAVORY DILATION N/A 12/19/2013   Procedure: SAVORY DILATION;  Surgeon: Inda Castle, MD;  Location: [facility];  Service: Endoscopy;  Laterality: N/A;  With Fluoroscopy  . SAVORY DILATION N/A 12/30/2013   Procedure: SAVORY DILATION;  Surgeon: Inda Castle, MD;  Location: [facility];  Service: Endoscopy;  Laterality: N/A;  . skin cancer extraction     right ear-extensive scar  . TRANSTHORACIC ECHOCARDIOGRAM  08/03/2009   Normal LV size. Mild LVH. EF 60-65%. Gr 2 DD, mild MR.   Social History   Social History  . Marital status: Married    Spouse name: N/A  . Number of children: 2  .  Years of education: N/A   Occupational History  . retired Retired    Therapist, sports  .      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   Social History Main Topics  . Smoking status: Never Smoker  . Smokeless tobacco: Never Used  . Alcohol use No  . Drug use: No  . Sexual activity: No   Other Topics Concern  . None   Social History Narrative   He and his wife Nathan Blackwell are the process of moving to an independent living center - [facility]. This will allow them to have 3 meals a day and available activities they come along with being in a retirement community.   He never smoked. Does not take alcohol.   He is a retired Scientist, product/process development.   Family history of Alcoholism/Addiction.   Allergies  Allergen Reactions  . Biaxin [Clarithromycin] Other (See Comments)    Unknown   . Other Other (See Comments)    Nephrologist has told patient not to eat many different foods (tomatoes, Green Foods, etc).   . Sulfa Antibiotics Other (See Comments)    Unknown    Family History  Problem Relation Age of Onset  . Stroke Father   . Diabetes Mother     Past medical history, social, surgical and family history all reviewed in electronic medical record.  No pertanent information unless stated regarding to the chief complaint.   Review of systems positive for joint and muscle aches as well as difficulty with memory.   Objective  Blood pressure (!) 172/80, pulse 76, height 5' 9.5" (1.765 m), weight 153 lb (69.4 kg), SpO2 99 %.   Systems examined below as of 03/14/16 General: NAD A&O x3 mood, affect normal  HEENT: Pupils equal, extraocular movements intact no nystagmus Respiratory: not short of breath at rest or with speaking Cardiovascular: No lower extremity edema, non tender Skin: Warm dry intact with no signs of infection or rash on extremities or on axial skeleton. Abdomen: Soft nontender, no masses Neuro: Cranial nerves   intact, neurovascularly intact in all extremities with 2+ DTRs and 2+ pulses. Lymph: No lymphadenopathy appreciated today    Gait antalgic slow shuffling gait. Uses the aid of a walker MSK:  MILD tender with full range of motion and good stability and symmetric strength and tone of shoulders, elbows, wrist, hip, and bilaterally. Severe arthritic of multiple joints Ankle: Bilateral 2+ pitting edema up to midcalf bilaterally no change  Appears to be nontender on exam. Mild limitation of range of motion bilaterally by 2-5 in all planes.  Knee: Left Continued atrophy noted no effusion noted Tender to palpation over the medial joint line possibly worse than previous exam Lacks last 5 of flexion and extension Instability noted with valgus force Negative Mcmurray's, Apley's, and Thessalonian tests. Non painful patellar compression. Patellar glide with mild crepitus. Patellar and quadriceps tendons unremarkable. Hamstring and quadriceps is weak but  symmetric. Arthritic changes of the right knee but nontender Mild worsening symptoms and previous exam.       Impression and Recommendations:     This case required medical decision making of moderate complexity.      Note: This dictation was prepared with Dragon dictation along with smaller phrase technology. Any transcriptional errors that result from this process are unintentional.

## 2016-03-14 NOTE — Assessment & Plan Note (Addendum)
Worsening symptoms at this time. We discussed icing regimen and home exercises. We discussed which activities patient will be fitted in a brace to maintain to try to help with the stability. Encourage him to continue to ambulate with the aid of a walker. Discussed the importance of avoiding DPL edema that he is getting. We discussed mobility issues. Decline formal physical therapy. Return again in 4 weeks. Worsening symptoms we'll consider aspiration. Spent  25 minutes with patient face-to-face and had greater than 50% of counseling including as described above in assessment and plan.

## 2016-03-14 NOTE — Patient Instructions (Addendum)
Good to see yo u pennsaid pinkie amount topically 2 times daily as needed.  Ice 20 minutes 2 times daily. Usually after activity and before bed. Try to elevate feet when sitting sometimes to see if it will help Try brace with walking Compresson socks of some kind can help with the pain and swelling as well.  See me again in 4 weeks and we can inject the knee again if we need to.

## 2016-03-16 ENCOUNTER — Ambulatory Visit: Payer: Medicare Other | Admitting: Family Medicine

## 2016-03-17 ENCOUNTER — Inpatient Hospital Stay (HOSPITAL_COMMUNITY)
Admission: EM | Admit: 2016-03-17 | Discharge: 2016-03-23 | DRG: 193 | Disposition: A | Discharge status: Skilled Nursing Facility | Payer: Medicare Other | Attending: Internal Medicine | Admitting: Internal Medicine

## 2016-03-17 ENCOUNTER — Encounter (HOSPITAL_COMMUNITY): Payer: Self-pay

## 2016-03-17 ENCOUNTER — Emergency Department (HOSPITAL_COMMUNITY): Payer: Medicare Other

## 2016-03-17 DIAGNOSIS — E870 Hyperosmolality and hypernatremia: Secondary | ICD-10-CM | POA: Diagnosis present

## 2016-03-17 DIAGNOSIS — E1122 Type 2 diabetes mellitus with diabetic chronic kidney disease: Secondary | ICD-10-CM | POA: Diagnosis present

## 2016-03-17 DIAGNOSIS — D72829 Elevated white blood cell count, unspecified: Secondary | ICD-10-CM | POA: Diagnosis present

## 2016-03-17 DIAGNOSIS — E440 Moderate protein-calorie malnutrition: Secondary | ICD-10-CM | POA: Diagnosis not present

## 2016-03-17 DIAGNOSIS — I214 Non-ST elevation (NSTEMI) myocardial infarction: Secondary | ICD-10-CM

## 2016-03-17 DIAGNOSIS — N183 Chronic kidney disease, stage 3 unspecified: Secondary | ICD-10-CM | POA: Diagnosis present

## 2016-03-17 DIAGNOSIS — M79605 Pain in left leg: Secondary | ICD-10-CM | POA: Diagnosis not present

## 2016-03-17 DIAGNOSIS — I10 Essential (primary) hypertension: Secondary | ICD-10-CM | POA: Diagnosis present

## 2016-03-17 DIAGNOSIS — R001 Bradycardia, unspecified: Secondary | ICD-10-CM | POA: Diagnosis present

## 2016-03-17 DIAGNOSIS — E860 Dehydration: Secondary | ICD-10-CM | POA: Diagnosis not present

## 2016-03-17 DIAGNOSIS — Z85038 Personal history of other malignant neoplasm of large intestine: Secondary | ICD-10-CM

## 2016-03-17 DIAGNOSIS — I82409 Acute embolism and thrombosis of unspecified deep veins of unspecified lower extremity: Secondary | ICD-10-CM | POA: Diagnosis present

## 2016-03-17 DIAGNOSIS — N400 Enlarged prostate without lower urinary tract symptoms: Secondary | ICD-10-CM | POA: Diagnosis present

## 2016-03-17 DIAGNOSIS — Z86718 Personal history of other venous thrombosis and embolism: Secondary | ICD-10-CM

## 2016-03-17 DIAGNOSIS — F0390 Unspecified dementia without behavioral disturbance: Secondary | ICD-10-CM | POA: Diagnosis present

## 2016-03-17 DIAGNOSIS — M1990 Unspecified osteoarthritis, unspecified site: Secondary | ICD-10-CM | POA: Diagnosis present

## 2016-03-17 DIAGNOSIS — I5030 Unspecified diastolic (congestive) heart failure: Secondary | ICD-10-CM | POA: Diagnosis present

## 2016-03-17 DIAGNOSIS — Z9049 Acquired absence of other specified parts of digestive tract: Secondary | ICD-10-CM

## 2016-03-17 DIAGNOSIS — K219 Gastro-esophageal reflux disease without esophagitis: Secondary | ICD-10-CM | POA: Diagnosis present

## 2016-03-17 DIAGNOSIS — J1000 Influenza due to other identified influenza virus with unspecified type of pneumonia: Principal | ICD-10-CM | POA: Diagnosis present

## 2016-03-17 DIAGNOSIS — R140 Abdominal distension (gaseous): Secondary | ICD-10-CM

## 2016-03-17 DIAGNOSIS — I130 Hypertensive heart and chronic kidney disease with heart failure and stage 1 through stage 4 chronic kidney disease, or unspecified chronic kidney disease: Secondary | ICD-10-CM | POA: Diagnosis present

## 2016-03-17 DIAGNOSIS — R404 Transient alteration of awareness: Secondary | ICD-10-CM | POA: Diagnosis not present

## 2016-03-17 DIAGNOSIS — Z8521 Personal history of malignant neoplasm of larynx: Secondary | ICD-10-CM

## 2016-03-17 DIAGNOSIS — J111 Influenza due to unidentified influenza virus with other respiratory manifestations: Secondary | ICD-10-CM

## 2016-03-17 DIAGNOSIS — J9601 Acute respiratory failure with hypoxia: Secondary | ICD-10-CM | POA: Diagnosis present

## 2016-03-17 DIAGNOSIS — N179 Acute kidney failure, unspecified: Secondary | ICD-10-CM | POA: Diagnosis not present

## 2016-03-17 DIAGNOSIS — E875 Hyperkalemia: Secondary | ICD-10-CM | POA: Diagnosis present

## 2016-03-17 DIAGNOSIS — G4733 Obstructive sleep apnea (adult) (pediatric): Secondary | ICD-10-CM | POA: Diagnosis present

## 2016-03-17 DIAGNOSIS — R778 Other specified abnormalities of plasma proteins: Secondary | ICD-10-CM | POA: Diagnosis present

## 2016-03-17 DIAGNOSIS — Z79899 Other long term (current) drug therapy: Secondary | ICD-10-CM

## 2016-03-17 DIAGNOSIS — E876 Hypokalemia: Secondary | ICD-10-CM | POA: Diagnosis present

## 2016-03-17 DIAGNOSIS — Y92002 Bathroom of unspecified non-institutional (private) residence single-family (private) house as the place of occurrence of the external cause: Secondary | ICD-10-CM

## 2016-03-17 DIAGNOSIS — J984 Other disorders of lung: Secondary | ICD-10-CM | POA: Diagnosis not present

## 2016-03-17 DIAGNOSIS — Z6821 Body mass index (BMI) 21.0-21.9, adult: Secondary | ICD-10-CM

## 2016-03-17 DIAGNOSIS — R0902 Hypoxemia: Secondary | ICD-10-CM | POA: Diagnosis not present

## 2016-03-17 DIAGNOSIS — I5032 Chronic diastolic (congestive) heart failure: Secondary | ICD-10-CM | POA: Diagnosis present

## 2016-03-17 DIAGNOSIS — E039 Hypothyroidism, unspecified: Secondary | ICD-10-CM | POA: Diagnosis present

## 2016-03-17 DIAGNOSIS — R531 Weakness: Secondary | ICD-10-CM | POA: Diagnosis not present

## 2016-03-17 DIAGNOSIS — R339 Retention of urine, unspecified: Secondary | ICD-10-CM | POA: Diagnosis present

## 2016-03-17 DIAGNOSIS — J101 Influenza due to other identified influenza virus with other respiratory manifestations: Secondary | ICD-10-CM | POA: Diagnosis present

## 2016-03-17 DIAGNOSIS — I2510 Atherosclerotic heart disease of native coronary artery without angina pectoris: Secondary | ICD-10-CM | POA: Diagnosis present

## 2016-03-17 DIAGNOSIS — R7989 Other specified abnormal findings of blood chemistry: Secondary | ICD-10-CM | POA: Diagnosis present

## 2016-03-17 DIAGNOSIS — Z7952 Long term (current) use of systemic steroids: Secondary | ICD-10-CM

## 2016-03-17 DIAGNOSIS — K5900 Constipation, unspecified: Secondary | ICD-10-CM | POA: Diagnosis present

## 2016-03-17 DIAGNOSIS — R1314 Dysphagia, pharyngoesophageal phase: Secondary | ICD-10-CM | POA: Diagnosis present

## 2016-03-17 DIAGNOSIS — R4182 Altered mental status, unspecified: Secondary | ICD-10-CM | POA: Diagnosis not present

## 2016-03-17 DIAGNOSIS — I248 Other forms of acute ischemic heart disease: Secondary | ICD-10-CM | POA: Diagnosis present

## 2016-03-17 DIAGNOSIS — W182XXA Fall in (into) shower or empty bathtub, initial encounter: Secondary | ICD-10-CM | POA: Diagnosis present

## 2016-03-17 DIAGNOSIS — Z66 Do not resuscitate: Secondary | ICD-10-CM | POA: Diagnosis present

## 2016-03-17 DIAGNOSIS — R109 Unspecified abdominal pain: Secondary | ICD-10-CM

## 2016-03-17 DIAGNOSIS — Z7982 Long term (current) use of aspirin: Secondary | ICD-10-CM

## 2016-03-17 DIAGNOSIS — Z823 Family history of stroke: Secondary | ICD-10-CM

## 2016-03-17 DIAGNOSIS — L8990 Pressure ulcer of unspecified site, unspecified stage: Secondary | ICD-10-CM | POA: Insufficient documentation

## 2016-03-17 DIAGNOSIS — Z833 Family history of diabetes mellitus: Secondary | ICD-10-CM

## 2016-03-17 LAB — CBC
HEMATOCRIT: 33.6 % — AB (ref 39.0–52.0)
Hemoglobin: 11 g/dL — ABNORMAL LOW (ref 13.0–17.0)
MCH: 30.8 pg (ref 26.0–34.0)
MCHC: 32.7 g/dL (ref 30.0–36.0)
MCV: 94.1 fL (ref 78.0–100.0)
Platelets: 230 10*3/uL (ref 150–400)
RBC: 3.57 MIL/uL — ABNORMAL LOW (ref 4.22–5.81)
RDW: 13.7 % (ref 11.5–15.5)
WBC: 18.1 10*3/uL — AB (ref 4.0–10.5)

## 2016-03-17 LAB — BASIC METABOLIC PANEL
Anion gap: 14 (ref 5–15)
BUN: 66 mg/dL — AB (ref 6–20)
CHLORIDE: 100 mmol/L — AB (ref 101–111)
CO2: 29 mmol/L (ref 22–32)
Calcium: 9 mg/dL (ref 8.9–10.3)
Creatinine, Ser: 2.82 mg/dL — ABNORMAL HIGH (ref 0.61–1.24)
GFR calc Af Amer: 21 mL/min — ABNORMAL LOW (ref >60)
GFR calc non Af Amer: 18 mL/min — ABNORMAL LOW (ref >60)
Glucose, Bld: 186 mg/dL — ABNORMAL HIGH (ref 65–99)
POTASSIUM: 3.6 mmol/L (ref 3.5–5.1)
SODIUM: 143 mmol/L (ref 135–145)

## 2016-03-17 LAB — I-STAT TROPONIN, ED: Troponin i, poc: 0.17 ng/mL (ref 0.00–0.08)

## 2016-03-17 LAB — I-STAT CG4 LACTIC ACID, ED: Lactic Acid, Venous: 2.39 mmol/L (ref 0.5–1.9)

## 2016-03-17 LAB — CBG MONITORING, ED: Glucose-Capillary: 181 mg/dL — ABNORMAL HIGH (ref 65–99)

## 2016-03-17 NOTE — ED Notes (Signed)
Per EMS- Pt has had progressive lethargy, unable to ambulate for 1 day. Has had chronic left knee issues. Wound on right upper shoulder. Alert and oriented.

## 2016-03-17 NOTE — ED Notes (Addendum)
Attempted lab draw x 2 but unsuccessful, RN,Rick made aware.

## 2016-03-18 ENCOUNTER — Emergency Department (HOSPITAL_COMMUNITY): Payer: Medicare Other

## 2016-03-18 ENCOUNTER — Encounter (HOSPITAL_COMMUNITY): Payer: Self-pay | Admitting: Internal Medicine

## 2016-03-18 ENCOUNTER — Inpatient Hospital Stay (HOSPITAL_COMMUNITY): Payer: Medicare Other

## 2016-03-18 DIAGNOSIS — R339 Retention of urine, unspecified: Secondary | ICD-10-CM | POA: Diagnosis present

## 2016-03-18 DIAGNOSIS — I214 Non-ST elevation (NSTEMI) myocardial infarction: Secondary | ICD-10-CM | POA: Diagnosis present

## 2016-03-18 DIAGNOSIS — R2689 Other abnormalities of gait and mobility: Secondary | ICD-10-CM | POA: Diagnosis not present

## 2016-03-18 DIAGNOSIS — I5032 Chronic diastolic (congestive) heart failure: Secondary | ICD-10-CM | POA: Diagnosis not present

## 2016-03-18 DIAGNOSIS — F0390 Unspecified dementia without behavioral disturbance: Secondary | ICD-10-CM | POA: Diagnosis not present

## 2016-03-18 DIAGNOSIS — R066 Hiccough: Secondary | ICD-10-CM | POA: Diagnosis not present

## 2016-03-18 DIAGNOSIS — E860 Dehydration: Secondary | ICD-10-CM | POA: Diagnosis present

## 2016-03-18 DIAGNOSIS — Z86718 Personal history of other venous thrombosis and embolism: Secondary | ICD-10-CM | POA: Diagnosis not present

## 2016-03-18 DIAGNOSIS — R748 Abnormal levels of other serum enzymes: Secondary | ICD-10-CM

## 2016-03-18 DIAGNOSIS — R1314 Dysphagia, pharyngoesophageal phase: Secondary | ICD-10-CM | POA: Diagnosis not present

## 2016-03-18 DIAGNOSIS — N400 Enlarged prostate without lower urinary tract symptoms: Secondary | ICD-10-CM | POA: Diagnosis present

## 2016-03-18 DIAGNOSIS — G4733 Obstructive sleep apnea (adult) (pediatric): Secondary | ICD-10-CM

## 2016-03-18 DIAGNOSIS — J984 Other disorders of lung: Secondary | ICD-10-CM | POA: Diagnosis not present

## 2016-03-18 DIAGNOSIS — E875 Hyperkalemia: Secondary | ICD-10-CM | POA: Diagnosis present

## 2016-03-18 DIAGNOSIS — R112 Nausea with vomiting, unspecified: Secondary | ICD-10-CM | POA: Diagnosis not present

## 2016-03-18 DIAGNOSIS — Y92002 Bathroom of unspecified non-institutional (private) residence single-family (private) house as the place of occurrence of the external cause: Secondary | ICD-10-CM | POA: Diagnosis not present

## 2016-03-18 DIAGNOSIS — J9601 Acute respiratory failure with hypoxia: Secondary | ICD-10-CM | POA: Diagnosis present

## 2016-03-18 DIAGNOSIS — L8990 Pressure ulcer of unspecified site, unspecified stage: Secondary | ICD-10-CM | POA: Insufficient documentation

## 2016-03-18 DIAGNOSIS — R609 Edema, unspecified: Secondary | ICD-10-CM | POA: Diagnosis not present

## 2016-03-18 DIAGNOSIS — E440 Moderate protein-calorie malnutrition: Secondary | ICD-10-CM | POA: Diagnosis not present

## 2016-03-18 DIAGNOSIS — J1000 Influenza due to other identified influenza virus with unspecified type of pneumonia: Secondary | ICD-10-CM | POA: Diagnosis present

## 2016-03-18 DIAGNOSIS — R509 Fever, unspecified: Secondary | ICD-10-CM | POA: Diagnosis not present

## 2016-03-18 DIAGNOSIS — R278 Other lack of coordination: Secondary | ICD-10-CM | POA: Diagnosis not present

## 2016-03-18 DIAGNOSIS — R600 Localized edema: Secondary | ICD-10-CM

## 2016-03-18 DIAGNOSIS — K219 Gastro-esophageal reflux disease without esophagitis: Secondary | ICD-10-CM | POA: Diagnosis present

## 2016-03-18 DIAGNOSIS — I10 Essential (primary) hypertension: Secondary | ICD-10-CM | POA: Diagnosis not present

## 2016-03-18 DIAGNOSIS — W182XXA Fall in (into) shower or empty bathtub, initial encounter: Secondary | ICD-10-CM | POA: Diagnosis present

## 2016-03-18 DIAGNOSIS — J101 Influenza due to other identified influenza virus with other respiratory manifestations: Secondary | ICD-10-CM

## 2016-03-18 DIAGNOSIS — E1122 Type 2 diabetes mellitus with diabetic chronic kidney disease: Secondary | ICD-10-CM | POA: Diagnosis not present

## 2016-03-18 DIAGNOSIS — M6281 Muscle weakness (generalized): Secondary | ICD-10-CM | POA: Diagnosis not present

## 2016-03-18 DIAGNOSIS — J1108 Influenza due to unidentified influenza virus with specified pneumonia: Secondary | ICD-10-CM | POA: Diagnosis not present

## 2016-03-18 DIAGNOSIS — I248 Other forms of acute ischemic heart disease: Secondary | ICD-10-CM | POA: Diagnosis present

## 2016-03-18 DIAGNOSIS — I130 Hypertensive heart and chronic kidney disease with heart failure and stage 1 through stage 4 chronic kidney disease, or unspecified chronic kidney disease: Secondary | ICD-10-CM | POA: Diagnosis present

## 2016-03-18 DIAGNOSIS — R140 Abdominal distension (gaseous): Secondary | ICD-10-CM | POA: Diagnosis not present

## 2016-03-18 DIAGNOSIS — E870 Hyperosmolality and hypernatremia: Secondary | ICD-10-CM | POA: Diagnosis present

## 2016-03-18 DIAGNOSIS — Z85038 Personal history of other malignant neoplasm of large intestine: Secondary | ICD-10-CM | POA: Diagnosis not present

## 2016-03-18 DIAGNOSIS — I2510 Atherosclerotic heart disease of native coronary artery without angina pectoris: Secondary | ICD-10-CM | POA: Diagnosis present

## 2016-03-18 DIAGNOSIS — N179 Acute kidney failure, unspecified: Secondary | ICD-10-CM | POA: Diagnosis present

## 2016-03-18 DIAGNOSIS — Z8521 Personal history of malignant neoplasm of larynx: Secondary | ICD-10-CM | POA: Diagnosis not present

## 2016-03-18 DIAGNOSIS — Z66 Do not resuscitate: Secondary | ICD-10-CM | POA: Diagnosis present

## 2016-03-18 DIAGNOSIS — N183 Chronic kidney disease, stage 3 (moderate): Secondary | ICD-10-CM | POA: Diagnosis not present

## 2016-03-18 DIAGNOSIS — R531 Weakness: Secondary | ICD-10-CM | POA: Diagnosis not present

## 2016-03-18 DIAGNOSIS — K5900 Constipation, unspecified: Secondary | ICD-10-CM | POA: Diagnosis present

## 2016-03-18 LAB — CBC
HCT: 31.5 % — ABNORMAL LOW (ref 39.0–52.0)
Hemoglobin: 10.3 g/dL — ABNORMAL LOW (ref 13.0–17.0)
MCH: 30.7 pg (ref 26.0–34.0)
MCHC: 32.7 g/dL (ref 30.0–36.0)
MCV: 94 fL (ref 78.0–100.0)
PLATELETS: 210 10*3/uL (ref 150–400)
RBC: 3.35 MIL/uL — AB (ref 4.22–5.81)
RDW: 13.8 % (ref 11.5–15.5)
WBC: 15 10*3/uL — ABNORMAL HIGH (ref 4.0–10.5)

## 2016-03-18 LAB — COMPREHENSIVE METABOLIC PANEL
ALK PHOS: 80 U/L (ref 38–126)
ALT: 20 U/L (ref 17–63)
AST: 37 U/L (ref 15–41)
Albumin: 3.2 g/dL — ABNORMAL LOW (ref 3.5–5.0)
Anion gap: 14 (ref 5–15)
BUN: 67 mg/dL — AB (ref 6–20)
CALCIUM: 8.5 mg/dL — AB (ref 8.9–10.3)
CO2: 28 mmol/L (ref 22–32)
CREATININE: 2.8 mg/dL — AB (ref 0.61–1.24)
Chloride: 101 mmol/L (ref 101–111)
GFR calc Af Amer: 21 mL/min — ABNORMAL LOW (ref >60)
GFR calc non Af Amer: 18 mL/min — ABNORMAL LOW (ref >60)
GLUCOSE: 161 mg/dL — AB (ref 65–99)
Potassium: 3.5 mmol/L (ref 3.5–5.1)
SODIUM: 143 mmol/L (ref 135–145)
Total Bilirubin: 0.6 mg/dL (ref 0.3–1.2)
Total Protein: 6.7 g/dL (ref 6.5–8.1)

## 2016-03-18 LAB — ECHOCARDIOGRAM COMPLETE
CHL CUP DOP CALC LVOT VTI: 22.4 cm
CHL CUP MV DEC (S): 384
E decel time: 384 msec
E/e' ratio: 7.7
FS: 26 % — AB (ref 28–44)
HEIGHTINCHES: 70 in
IVS/LV PW RATIO, ED: 0.83
LA ID, A-P, ES: 30 mm
LA diam index: 1.63 cm/m2
LA vol A4C: 45.3 ml
LA vol index: 25.2 mL/m2
LA vol: 46.3 mL
LDCA: 2.54 cm2
LEFT ATRIUM END SYS DIAM: 30 mm
LV E/e' medial: 7.7
LV E/e'average: 7.7
LV PW d: 11.5 mm — AB (ref 0.6–1.1)
LV TDI E'MEDIAL: 5.98
LV e' LATERAL: 9.57 cm/s
LVOT peak vel: 101 cm/s
LVOTD: 18 mm
LVOTSV: 57 mL
MV Peak grad: 2 mmHg
MV pk A vel: 130 m/s
MV pk E vel: 73.7 m/s
RV TAPSE: 18 mm
TDI e' lateral: 9.57
WEIGHTICAEL: 2370.39 [oz_av]

## 2016-03-18 LAB — PROTIME-INR
INR: 1.1
Prothrombin Time: 14.3 seconds (ref 11.4–15.2)

## 2016-03-18 LAB — LACTIC ACID, PLASMA
LACTIC ACID, VENOUS: 1.6 mmol/L (ref 0.5–1.9)
LACTIC ACID, VENOUS: 1.4 mmol/L (ref 0.5–1.9)
Lactic Acid, Venous: 1.4 mmol/L (ref 0.5–1.9)

## 2016-03-18 LAB — HEPARIN LEVEL (UNFRACTIONATED)
HEPARIN UNFRACTIONATED: 0.58 [IU]/mL (ref 0.30–0.70)
Heparin Unfractionated: 0.57 IU/mL (ref 0.30–0.70)

## 2016-03-18 LAB — CK: Total CK: 718 U/L — ABNORMAL HIGH (ref 49–397)

## 2016-03-18 LAB — INFLUENZA PANEL BY PCR (TYPE A & B)
Influenza A By PCR: POSITIVE — AB
Influenza B By PCR: NEGATIVE

## 2016-03-18 LAB — STREP PNEUMONIAE URINARY ANTIGEN: STREP PNEUMO URINARY ANTIGEN: NEGATIVE

## 2016-03-18 LAB — TSH: TSH: 1.155 u[IU]/mL (ref 0.350–4.500)

## 2016-03-18 LAB — TROPONIN I
TROPONIN I: 0.12 ng/mL — AB (ref <0.03)
Troponin I: 0.13 ng/mL (ref <0.03)
Troponin I: 0.1 ng/mL (ref <0.03)

## 2016-03-18 LAB — APTT: aPTT: 31 seconds (ref 24–36)

## 2016-03-18 LAB — CREATININE, URINE, RANDOM: CREATININE, URINE: 133.83 mg/dL

## 2016-03-18 LAB — MRSA PCR SCREENING: MRSA BY PCR: NEGATIVE

## 2016-03-18 LAB — PROCALCITONIN: Procalcitonin: 20.51 ng/mL

## 2016-03-18 LAB — PHOSPHORUS: Phosphorus: 4.1 mg/dL (ref 2.5–4.6)

## 2016-03-18 LAB — MAGNESIUM: Magnesium: 2.2 mg/dL (ref 1.7–2.4)

## 2016-03-18 LAB — SODIUM, URINE, RANDOM

## 2016-03-18 MED ORDER — BISACODYL 10 MG RE SUPP: 10.0000 mg | Freq: Every day | RECTAL | Status: DC | PRN

## 2016-03-18 MED ORDER — CITALOPRAM HYDROBROMIDE 20 MG PO TABS
20.0000 mg | ORAL_TABLET | Freq: Every morning | ORAL | Status: DC
  Administered 2016-03-20 – 2016-03-23 (×4): 20 mg via ORAL
  Filled 2016-03-18 (×5): qty 1

## 2016-03-18 MED ORDER — VANCOMYCIN HCL IN DEXTROSE 1-5 GM/200ML-% IV SOLN: 1000.0000 mg | INTRAVENOUS | Status: DC

## 2016-03-18 MED ORDER — PANTOPRAZOLE SODIUM 20 MG PO TBEC
20.0000 mg | DELAYED_RELEASE_TABLET | Freq: Every day | ORAL | Status: DC
  Administered 2016-03-20 – 2016-03-23 (×4): 20 mg via ORAL
  Filled 2016-03-18 (×6): qty 1

## 2016-03-18 MED ORDER — OSELTAMIVIR PHOSPHATE 30 MG PO CAPS
30.0000 mg | ORAL_CAPSULE | Freq: Every day | ORAL | Status: AC
  Administered 2016-03-20 – 2016-03-22 (×3): 30 mg via ORAL
  Filled 2016-03-18 (×5): qty 1

## 2016-03-18 MED ORDER — PIPERACILLIN-TAZOBACTAM IN DEX 2-0.25 GM/50ML IV SOLN
2.2500 g | Freq: Four times a day (QID) | INTRAVENOUS | Status: DC
  Administered 2016-03-18 – 2016-03-21 (×13): 2.25 g via INTRAVENOUS
  Filled 2016-03-18 (×17): qty 50

## 2016-03-18 MED ORDER — OSELTAMIVIR PHOSPHATE 75 MG PO CAPS
75.0000 mg | ORAL_CAPSULE | Freq: Two times a day (BID) | ORAL | Status: DC
  Filled 2016-03-18 (×2): qty 1

## 2016-03-18 MED ORDER — HEPARIN (PORCINE) IN NACL 100-0.45 UNIT/ML-% IJ SOLN
800.0000 [IU]/h | INTRAMUSCULAR | Status: DC
  Administered 2016-03-18 – 2016-03-19 (×2): 800 [IU]/h via INTRAVENOUS
  Filled 2016-03-18 (×3): qty 250

## 2016-03-18 MED ORDER — QUETIAPINE FUMARATE 25 MG PO TABS
50.0000 mg | ORAL_TABLET | Freq: Every day | ORAL | Status: DC
  Administered 2016-03-19 – 2016-03-22 (×4): 50 mg via ORAL
  Filled 2016-03-18 (×3): qty 2
  Filled 2016-03-18: qty 1

## 2016-03-18 MED ORDER — HEPARIN BOLUS VIA INFUSION
2000.0000 [IU] | Freq: Once | INTRAVENOUS | Status: AC
  Administered 2016-03-18: 2000 [IU] via INTRAVENOUS
  Filled 2016-03-18: qty 2000

## 2016-03-18 MED ORDER — PREDNISONE 10 MG PO TABS
10.0000 mg | ORAL_TABLET | Freq: Two times a day (BID) | ORAL | Status: DC
  Administered 2016-03-20 – 2016-03-23 (×6): 10 mg via ORAL
  Filled 2016-03-18 (×8): qty 1

## 2016-03-18 MED ORDER — HEPARIN BOLUS VIA INFUSION
2000.0000 [IU] | Freq: Once | INTRAVENOUS | Status: DC
  Filled 2016-03-18: qty 2000

## 2016-03-18 MED ORDER — ENSURE ENLIVE PO LIQD
237.0000 mL | Freq: Two times a day (BID) | ORAL | Status: DC
  Administered 2016-03-21 – 2016-03-23 (×2): 237 mL via ORAL

## 2016-03-18 MED ORDER — OLANZAPINE 5 MG PO TABS
5.0000 mg | ORAL_TABLET | Freq: Every morning | ORAL | Status: DC
  Administered 2016-03-20 – 2016-03-23 (×4): 5 mg via ORAL
  Filled 2016-03-18 (×5): qty 1

## 2016-03-18 MED ORDER — DEXTROSE 5 % IV SOLN: 1.0000 g | Freq: Once | INTRAVENOUS | Status: DC

## 2016-03-18 MED ORDER — VANCOMYCIN HCL IN DEXTROSE 1-5 GM/200ML-% IV SOLN
1000.0000 mg | Freq: Once | INTRAVENOUS | Status: AC
  Administered 2016-03-18: 1000 mg via INTRAVENOUS
  Filled 2016-03-18: qty 200

## 2016-03-18 MED ORDER — SODIUM CHLORIDE 0.9% FLUSH
3.0000 mL | Freq: Two times a day (BID) | INTRAVENOUS | Status: DC
  Administered 2016-03-18 – 2016-03-22 (×4): 3 mL via INTRAVENOUS

## 2016-03-18 MED ORDER — ONDANSETRON HCL 4 MG PO TABS: 4.0000 mg | ORAL_TABLET | Freq: Four times a day (QID) | ORAL | Status: DC | PRN

## 2016-03-18 MED ORDER — ISOSORBIDE MONONITRATE ER 30 MG PO TB24
30.0000 mg | ORAL_TABLET | Freq: Every morning | ORAL | Status: DC
  Filled 2016-03-18 (×3): qty 1

## 2016-03-18 MED ORDER — PIPERACILLIN-TAZOBACTAM 3.375 G IVPB 30 MIN
3.3750 g | Freq: Once | INTRAVENOUS | Status: AC
  Administered 2016-03-18: 3.375 g via INTRAVENOUS
  Filled 2016-03-18: qty 50

## 2016-03-18 MED ORDER — TAMSULOSIN HCL 0.4 MG PO CAPS
0.4000 mg | ORAL_CAPSULE | Freq: Every day | ORAL | Status: DC
  Administered 2016-03-18 – 2016-03-22 (×5): 0.4 mg via ORAL
  Filled 2016-03-18 (×5): qty 1

## 2016-03-18 MED ORDER — CEFTRIAXONE SODIUM 1 G IJ SOLR
1.0000 g | Freq: Once | INTRAMUSCULAR | Status: DC
  Administered 2016-03-18: 1 g via INTRAVENOUS
  Filled 2016-03-18: qty 10

## 2016-03-18 MED ORDER — ASPIRIN EC 81 MG PO TBEC
81.0000 mg | DELAYED_RELEASE_TABLET | Freq: Every day | ORAL | Status: DC
  Administered 2016-03-23: 81 mg via ORAL
  Filled 2016-03-18 (×5): qty 1

## 2016-03-18 MED ORDER — ASPIRIN 81 MG PO CHEW
324.0000 mg | CHEWABLE_TABLET | Freq: Once | ORAL | Status: AC
  Administered 2016-03-18: 324 mg via ORAL
  Filled 2016-03-18: qty 4

## 2016-03-18 MED ORDER — TRAMADOL HCL 50 MG PO TABS: 50.0000 mg | ORAL_TABLET | Freq: Three times a day (TID) | ORAL | Status: DC | PRN

## 2016-03-18 MED ORDER — PANCRELIPASE (LIP-PROT-AMYL) 12000-38000 UNITS PO CPEP
12000.0000 [IU] | ORAL_CAPSULE | Freq: Three times a day (TID) | ORAL | Status: DC
  Administered 2016-03-20 – 2016-03-23 (×9): 12000 [IU] via ORAL
  Filled 2016-03-18 (×18): qty 1

## 2016-03-18 MED ORDER — LEVOTHYROXINE SODIUM 100 MCG PO TABS
100.0000 ug | ORAL_TABLET | Freq: Every day | ORAL | Status: DC
  Administered 2016-03-20 – 2016-03-23 (×4): 100 ug via ORAL
  Filled 2016-03-18 (×5): qty 1

## 2016-03-18 MED ORDER — ACETAMINOPHEN 325 MG PO TABS
650.0000 mg | ORAL_TABLET | Freq: Four times a day (QID) | ORAL | Status: DC | PRN
  Administered 2016-03-18: 650 mg via ORAL
  Filled 2016-03-18: qty 2

## 2016-03-18 MED ORDER — HEPARIN (PORCINE) IN NACL 100-0.45 UNIT/ML-% IJ SOLN
800.0000 [IU]/h | INTRAMUSCULAR | Status: DC
  Filled 2016-03-18: qty 250

## 2016-03-18 MED ORDER — ONDANSETRON HCL 4 MG/2ML IJ SOLN
4.0000 mg | Freq: Four times a day (QID) | INTRAMUSCULAR | Status: DC | PRN
  Administered 2016-03-19: 4 mg via INTRAVENOUS
  Filled 2016-03-18: qty 2

## 2016-03-18 MED ORDER — SODIUM CHLORIDE 0.9 % IV SOLN
INTRAVENOUS | Status: DC
  Administered 2016-03-19: 05:00:00 via INTRAVENOUS

## 2016-03-18 MED ORDER — SODIUM CHLORIDE 0.9 % IV SOLN
INTRAVENOUS | Status: AC
  Administered 2016-03-18 (×2): via INTRAVENOUS

## 2016-03-18 MED ORDER — POLYETHYLENE GLYCOL 3350 17 G PO PACK: 17.0000 g | PACK | Freq: Every day | ORAL | Status: DC | PRN

## 2016-03-18 MED ORDER — ALBUTEROL SULFATE (2.5 MG/3ML) 0.083% IN NEBU: 2.5000 mg | INHALATION_SOLUTION | RESPIRATORY_TRACT | Status: DC | PRN

## 2016-03-18 MED ORDER — ACETAMINOPHEN 650 MG RE SUPP: 650.0000 mg | Freq: Four times a day (QID) | RECTAL | Status: DC | PRN

## 2016-03-18 MED ORDER — GUAIFENESIN ER 600 MG PO TB12
600.0000 mg | ORAL_TABLET | Freq: Two times a day (BID) | ORAL | Status: DC
  Administered 2016-03-18 – 2016-03-23 (×5): 600 mg via ORAL
  Filled 2016-03-18 (×9): qty 1

## 2016-03-18 MED ORDER — TIZANIDINE HCL 4 MG PO TABS: 4.0000 mg | ORAL_TABLET | Freq: Four times a day (QID) | ORAL | Status: DC | PRN

## 2016-03-18 MED ORDER — FAMOTIDINE 20 MG PO TABS
20.0000 mg | ORAL_TABLET | Freq: Every day | ORAL | Status: DC
Start: 2016-03-18 — End: 2016-03-23
  Administered 2016-03-20 – 2016-03-23 (×4): 20 mg via ORAL
  Filled 2016-03-18 (×5): qty 1

## 2016-03-18 NOTE — Progress Notes (Signed)
PHARMACY NOTE:  ANTIMICROBIAL RENAL DOSAGE ADJUSTMENT  Current antimicrobial regimen includes a mismatch between antimicrobial dosage and estimated renal function.  As per policy approved by the Pharmacy & Therapeutics and Medical Executive Committees, the antimicrobial dosage will be adjusted accordingly.  Current antimicrobial dosage:  Tamiflu 75 mg bid  Indication: Influenza   Renal Function: CrCl~16.1 ml/min  Estimated Creatinine Clearance: 16.1 mL/min (by C-G formula based on SCr of 2.82 mg/dL (H)). []      On intermittent HD, scheduled: []      On CRRT    Antimicrobial dosage has been changed to:  Tamiflu 30 mg daily  Thank you for allowing pharmacy to be a part of this patient's care.  Dorrene German, [facility] 03/18/2016 3:59 AM

## 2016-03-18 NOTE — Progress Notes (Signed)
  Echocardiogram 2D Echocardiogram has been performed.  Nathan Blackwell 03/18/2016, 9:09 AM

## 2016-03-18 NOTE — Progress Notes (Signed)
Pharmacy Antibiotic Note  Nathan Blackwell is a 83 y.o. male c/o progressive lethargy admitted on 03/17/2016 with sepsis.  Pharmacy has been consulted for zosyn/vancomycin dosing.  Plan: Zosyn 3.375 Gm x1 then 2.25 Gm IV q6h Vancomycin 1 Gm IV q48h  VT=15-20 mg/L F/u Scr/cultures/levels     Temp (24hrs), Avg:98.6 F (37 C), Min:98.6 F (37 C), Max:98.6 F (37 C)   Recent Labs Lab 03/17/16 2308 03/17/16 2348 03/18/16 0113  WBC 18.1*  --   --   CREATININE 2.82*  --   --   LATICACIDVEN  --  2.39* 1.6    Estimated Creatinine Clearance: 16.1 mL/min (by C-G formula based on SCr of 2.82 mg/dL (H)).    Allergies  Allergen Reactions  . Biaxin [Clarithromycin] Other (See Comments)    Unknown   . Other Other (See Comments)    Nephrologist has told patient not to eat many different foods (tomatoes, [facility] [employer], etc).   . Sulfa Antibiotics Other (See Comments)    Unknown     Antimicrobials this admission: 1/13 rocephin >> x1 ED 1/13 vancomycin >>  1/13 zosyn >>  Dose adjustments this admission:   Microbiology results:  BCx:   UCx:    Sputum:    MRSA PCR:   Thank you for allowing pharmacy to be a part of this patient's care.  Dorrene German 03/18/2016 2:55 AM

## 2016-03-18 NOTE — Consult Note (Addendum)
Primary cardiologist: Dr Glenetta Hew Consulting cardiologist: Dr Carlyle Dolly Requesting physician: Dr Lonny Prude Indication: elevated troponin  Clinical Summary Nathan Blackwell is a 83 y.o.male long history of atypical chest pain with myoview 01/2016 without clear ischemia, rate dependent LBBB, HL, HTN, DM2, CKD 3, bradycardia, documented DNR status admitted with weakness and cough. Admitted for sepsis secondary to influenza A. Patient is lethargic and not able to give history.    K 3.6 Cr 2.8 (baseline 1.6-2), Hgb 11, WBC 18.1, Influenza A positive, lactic acid 2.39 Trop 0.17-->0.13-->pending CXR pneumonia Echo pending EKG SR, no specific ischemic changes Allergies  Allergen Reactions  . Biaxin [Clarithromycin] Other (See Comments)    Unknown   . Other Other (See Comments)    Nephrologist has told patient not to eat many different foods (tomatoes, Green [employer], etc).   . Sulfa Antibiotics Other (See Comments)    Unknown     Medications Scheduled Medications: . aspirin EC  81 mg Oral Daily  . citalopram  20 mg Oral q morning - 10a  . famotidine  20 mg Oral Daily  . feeding supplement (ENSURE ENLIVE)  237 mL Oral BID BM  . guaiFENesin  600 mg Oral BID  . isosorbide mononitrate  30 mg Oral q morning - 10a  . levothyroxine  100 mcg Oral QAC breakfast  . lipase/protease/amylase  12,000 Units Oral TID WC  . [START ON 03/19/2016] OLANZapine  5 mg Oral q morning - 10a  . oseltamivir  30 mg Oral Daily  . pantoprazole  20 mg Oral Daily  . piperacillin-tazobactam (ZOSYN)  IV  2.25 g Intravenous Q6H  . predniSONE  10 mg Oral BID WC  . [START ON 03/19/2016] QUEtiapine  50 mg Oral QHS  . sodium chloride flush  3 mL Intravenous Q12H  . tamsulosin  0.4 mg Oral QHS  . [START ON 03/20/2016] vancomycin  1,000 mg Intravenous Q48H     Infusions: . sodium chloride 75 mL/hr at 03/18/16 0600  . heparin 800 Units/hr (03/18/16 0600)     PRN Medications:  acetaminophen **OR**  acetaminophen, albuterol, bisacodyl, ondansetron **OR** ondansetron (ZOFRAN) IV, polyethylene glycol, tiZANidine, traMADol   Past Medical History:  Diagnosis Date  . Anemia    NOS  . ANEMIA-NOS 06/23/2008  . ARTHRITIS 02/23/2007  . B12 deficiency 05/25/2010  . BACK PAIN, THORACIC REGION 06/04/2008  . BENIGN PROSTATIC HYPERTROPHY 10/08/2006  . BRADYCARDIA 06/10/2009  . Cancer of colon ([place]) dx'd 1998  . 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 CLOSTRIDIUM DIFFICILE COLITIS 01/30/2007  . Colitis, Clostridium difficile    presumed 11/08  . COLON CANCER, HX OF 09/20/2006  . COLONIC POLYPS, HX OF 10/08/2006  . CONSTIPATION, RECURRENT 01/06/2010  . Dementia   . DEMENTIA 05/14/2007  . DEPRESSION 02/27/2007  . Diabetes mellitus ([place])   . Diabetes mellitus type II   . DIABETES MELLITUS, TYPE II 09/20/2006  . Diastolic congestive heart failure ([place])   . DVT of lower limb, acute ([place]) 01/12/2011  . Elevated PSA   . GERD 01/14/2007  . GERD (gastroesophageal reflux disease)   . Hyperlipidemia   . HYPERLIPIDEMIA 09/20/2006  . Hypertension   . HYPERTENSION 09/20/2006  . Hypothyroidism   . HYPOTHYROIDISM 01/14/2007  . Hypothyroidism   . 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 Nephrolithiasis   . NEPHROLITHIASIS, HX OF 01/14/2007  . OSA (obstructive sleep apnea)   . Osteoporosis   . OSTEOPOROSIS 10/08/2006  . PERIPHERAL EDEMA 09/03/2007  . Prostatic  hypertrophy    benign  . RENAL INSUFFICIENCY 04/01/2010  . Rotator cuff syndrome    chronic  . Sleep apnea    CPAP dependent  . SLEEP APNEA, OBSTRUCTIVE 01/14/2007  . Thyroid nodule   . THYROID NODULE 01/14/2007  . Vitamin B 12 deficiency     Past Surgical History:  Procedure Laterality Date  . APPENDECTOMY  1998  . CATARACT EXTRACTION, BILATERAL    . CHOLECYSTECTOMY  1999  . COLON RESECTION  1998  . ESOPHAGOGASTRODUODENOSCOPY N/A 12/30/2013   Procedure: ESOPHAGOGASTRODUODENOSCOPY (EGD);  Surgeon: Inda Castle, MD;  Location: [facility];   Service: Endoscopy;  Laterality: N/A;  . ESOPHAGOGASTRODUODENOSCOPY (EGD) WITH PROPOFOL N/A 12/19/2013   Procedure: ESOPHAGOGASTRODUODENOSCOPY (EGD) WITH PROPOFOL;  Surgeon: Inda Castle, MD;  Location: [facility];  Service: Endoscopy;  Laterality: N/A;  . inguinal herniorrhapy  1984  . NM LEXISCAN MYOVIEW LTD  09/02/2013   Normal LV function and wall motion. EF 62%; rate dependent LBBB with Lexiscan, Low Risk fixed inferior defect suggestive of diaphragmatic attenuation and not infarct with normal wall motion.  Marland Kitchen SAVORY DILATION N/A 12/19/2013   Procedure: SAVORY DILATION;  Surgeon: Inda Castle, MD;  Location: [facility];  Service: Endoscopy;  Laterality: N/A;  With Fluoroscopy  . SAVORY DILATION N/A 12/30/2013   Procedure: SAVORY DILATION;  Surgeon: Inda Castle, MD;  Location: [facility];  Service: Endoscopy;  Laterality: N/A;  . skin cancer extraction     right ear-extensive scar  . TRANSTHORACIC ECHOCARDIOGRAM  08/03/2009   Normal LV size. Mild LVH. EF 60-65%. Gr 2 DD, mild MR.    Family History  Problem Relation Age of Onset  . Stroke Father   . Diabetes Mother     Social History Nathan Blackwell reports that he has never smoked. He has never used smokeless tobacco. [name] reports that he does not drink alcohol.  Review of Systems Unable to collect, patient is lethargic    Physical Examination Blood pressure (!) 105/46, pulse 75, temperature 98.6 F (37 C), temperature source Oral, resp. rate 12, height 5\' 10"  (1.778 m), weight 148 lb 2.4 oz (67.2 kg), SpO2 95 %.  Intake/Output Summary (Last 24 hours) at 03/18/16 0814 Last data filed at 03/18/16 0600  Gross per 24 hour  Intake           465.53 ml  Output              300 ml  Net           165.53 ml   General: lethargic, NAD  HEENT: sclera clear, throat clear  Cardiovascular: RRR, no m/r/g, no jvd  Respiratory: coarse breathsounds bilaterally  GI: abdomen soft, NT, ND  MSK: 1+ bilateral LE  edema  Neuro: no focal deficits. Slow to answer questions.   Psych: appropriate affect  Lab Results  Basic Metabolic Panel:  Recent Labs Lab 03/17/16 2308 03/18/16 0337  NA 143 143  K 3.6 3.5  CL 100* 101  CO2 29 28  GLUCOSE 186* 161*  BUN 66* 67*  CREATININE 2.82* 2.80*  CALCIUM 9.0 8.5*  MG  --  2.2  PHOS  --  4.1    Liver Function Tests:  Recent Labs Lab 03/18/16 0337  AST 37  ALT 20  ALKPHOS 80  BILITOT 0.6  PROT 6.7  ALBUMIN 3.2*    CBC:  Recent Labs Lab 03/17/16 2308 03/18/16 0337  WBC 18.1* 15.0*  HGB 11.0* 10.3*  HCT 33.6* 31.5*  MCV 94.1 94.0  PLT 230 210    Cardiac Enzymes:  Recent Labs Lab 03/18/16 0116  CKTOTAL 718*  TROPONINI 0.13*    BNP: Invalid input(s): POCBNP     Impression/Recommendations  1. Influenza sepsis - management per primary team  2. Elevated troponin - istat trop 0.17, lab troponin 0.13. EKG without acute ischemic changes - mild troponin in setting of influenza sepsis. Fairly recent myoview without clear ischemia - suspect demand ischemia in setting of sepsis. If troponin this AM continues to trend down can d/c heparin. Troponin appears to be trending down however may be some difference between istat and lab trop, would f/u AM troponin and if stable or trending down stop heparin. - would be high risk for cath at any point given advanced age and renal dysfunction, I think he would have to be a high risk ACS presentation to even consider cath, which is not the current situation.   We will f/u echo and troponin trend. No further cardiac recs at thist itme   Carlyle Dolly, M.D.

## 2016-03-18 NOTE — Progress Notes (Signed)
Patient seen and examined at bedside, patient admitted after midnight, please see earlier detailed admission note by Dr. Roel Cluck. Briefly, patient presented generalized weakness and decreased by mouth intake. He has been getting more and more lethargic. He is found to have influenza A infection. Concern for possible pneumonia as well. Treating with antibiotics and Tamiflu. Troponin elevated and trending down with no ischemic changes on EKG patient is on heparin with plan to stop this. Will obtain a d-dimer for initial concern of possible DVT.  Cordelia Poche, MD [facility] 03/18/2016, 1:29 PM Pager: [phone]

## 2016-03-18 NOTE — Progress Notes (Signed)
ANTICOAGULATION CONSULT NOTE - Follow Up  Pharmacy Consult for IV heparin Indication: chest pain/ACS  Allergies  Allergen Reactions  . Biaxin [Clarithromycin] Other (See Comments)    Unknown   . Other Other (See Comments)    Nephrologist has told patient not to eat many different foods (tomatoes, [employer], etc).   . Sulfa Antibiotics Other (See Comments)    Unknown     Patient Measurements: Height: 5\' 10"  (177.8 cm) Weight: 148 lb 2.4 oz (67.2 kg) IBW/kg (Calculated) : 73 Heparin Dosing Weight: 69.4 (TBW)  Vital Signs: Temp: 99.5 F (37.5 C) (01/13 1940) Temp Source: Oral (01/13 1940) BP: 143/99 (01/13 2000)  Labs:  Recent Labs  03/17/16 2308 03/18/16 0033 03/18/16 0116 03/18/16 0337 03/18/16 0724 03/18/16 1107 03/18/16 1534 03/18/16 2005  HGB 11.0*  --   --  10.3*  --   --   --   --   HCT 33.6*  --   --  31.5*  --   --   --   --   PLT 230  --   --  210  --   --   --   --   APTT  --  31  --   --   --   --   --   --   LABPROT  --  14.3  --   --   --   --   --   --   INR  --  1.10  --   --   --   --   --   --   HEPARINUNFRC  --   --   --   --   --  0.58  --  0.57  CREATININE 2.82*  --   --  2.80*  --   --   --   --   CKTOTAL  --   --  718*  --   --   --   --   --   TROPONINI  --   --  0.13*  --  0.12*  --  0.10*  --     Estimated Creatinine Clearance: 15.7 mL/min (by C-G formula based on SCr of 2.8 mg/dL (H)).   Medical History: Past Medical History:  Diagnosis Date  . Anemia    NOS  . ANEMIA-NOS 06/23/2008  . ARTHRITIS 02/23/2007  . B12 deficiency 05/25/2010  . BACK PAIN, THORACIC REGION 06/04/2008  . BENIGN PROSTATIC HYPERTROPHY 10/08/2006  . BRADYCARDIA 06/10/2009  . Cancer of colon ([place]) dx'd 1998  . 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 CLOSTRIDIUM DIFFICILE COLITIS 01/30/2007  . Colitis, Clostridium difficile    presumed 11/08  . COLON CANCER, HX OF 09/20/2006  . COLONIC POLYPS, HX OF 10/08/2006  . CONSTIPATION, RECURRENT 01/06/2010  . Dementia    . DEMENTIA 05/14/2007  . DEPRESSION 02/27/2007  . Diabetes mellitus ([place])   . Diabetes mellitus type II   . DIABETES MELLITUS, TYPE II 09/20/2006  . Diastolic congestive heart failure ([place])   . DVT of lower limb, acute ([place]) 01/12/2011  . Elevated PSA   . GERD 01/14/2007  . GERD (gastroesophageal reflux disease)   . Hyperlipidemia   . HYPERLIPIDEMIA 09/20/2006  . Hypertension   . HYPERTENSION 09/20/2006  . Hypothyroidism   . HYPOTHYROIDISM 01/14/2007  . Hypothyroidism   . 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 Nephrolithiasis   . NEPHROLITHIASIS, HX OF 01/14/2007  . OSA (obstructive sleep apnea)   . Osteoporosis   .  OSTEOPOROSIS 10/08/2006  . PERIPHERAL EDEMA 09/03/2007  . Prostatic hypertrophy    benign  . RENAL INSUFFICIENCY 04/01/2010  . Rotator cuff syndrome    chronic  . Sleep apnea    CPAP dependent  . SLEEP APNEA, OBSTRUCTIVE 01/14/2007  . Thyroid nodule   . THYROID NODULE 01/14/2007  . Vitamin B 12 deficiency     Medications:  Scheduled:  . aspirin EC  81 mg Oral Daily  . citalopram  20 mg Oral q morning - 10a  . famotidine  20 mg Oral Daily  . feeding supplement (ENSURE ENLIVE)  237 mL Oral BID BM  . guaiFENesin  600 mg Oral BID  . isosorbide mononitrate  30 mg Oral q morning - 10a  . levothyroxine  100 mcg Oral QAC breakfast  . lipase/protease/amylase  12,000 Units Oral TID WC  . [START ON 03/19/2016] OLANZapine  5 mg Oral q morning - 10a  . oseltamivir  30 mg Oral Daily  . pantoprazole  20 mg Oral Daily  . piperacillin-tazobactam (ZOSYN)  IV  2.25 g Intravenous Q6H  . predniSONE  10 mg Oral BID WC  . [START ON 03/19/2016] QUEtiapine  50 mg Oral QHS  . sodium chloride flush  3 mL Intravenous Q12H  . tamsulosin  0.4 mg Oral QHS  . [START ON 03/20/2016] vancomycin  1,000 mg Intravenous Q48H   Infusions:  . sodium chloride 75 mL/hr at 03/18/16 2000  . heparin 800 Units/hr (03/18/16 2000)    Assessment: 18 yoM c/o progressive lethargy.  Now with elevated  troponin=0.17.  IV heparin per Rx for ACS.   Today, 03/18/16  Second heparin level also therapeutic on 800units/hr  Hgb, Plts stable  No reported bleeding  Goal of Therapy:  Heparin level 0.3-0.7 units/ml Monitor platelets by anticoagulation protocol: Yes   Plan:  1) Continue current IV heparin rate of 800 units/hr 2) Daily CBC and heparin level 3) Per cards, if troponin continues to trend downward can discontinue heparin  Romeo Rabon, PharmD, pager [phone]. 03/18/2016,8:59 PM.

## 2016-03-18 NOTE — ED Provider Notes (Signed)
[place] DEPT Provider Note   CSN: [id number] Arrival date & time: 03/17/16  2032     History   Chief Complaint Chief Complaint  Patient presents with  . Weakness    HPI Nathan Blackwell is a 83 y.o. male.  HPI Pt is a 83 y/o male that comes in with cc of weakness. Pt has hx of DM, CAD, CHF, DVT. He lives with family, and is very independent. However, the last 3-4 days pt has gotten weaker. Pt had a fall earlier today, and this evening he was unable to get up. Pt has no complains. He has been coughing x 2 days, and its been dry. No fevers or chills at home. Pt did have contact with someone with flu in the house.  Past Medical History:  Diagnosis Date  . Anemia    NOS  . ANEMIA-NOS 06/23/2008  . ARTHRITIS 02/23/2007  . B12 deficiency 05/25/2010  . BACK PAIN, THORACIC REGION 06/04/2008  . BENIGN PROSTATIC HYPERTROPHY 10/08/2006  . BRADYCARDIA 06/10/2009  . Cancer of colon ([place]) dx'd 1998  . 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 CLOSTRIDIUM DIFFICILE COLITIS 01/30/2007  . Colitis, Clostridium difficile    presumed 11/08  . COLON CANCER, HX OF 09/20/2006  . COLONIC POLYPS, HX OF 10/08/2006  . CONSTIPATION, RECURRENT 01/06/2010  . Dementia   . DEMENTIA 05/14/2007  . DEPRESSION 02/27/2007  . Diabetes mellitus ([place])   . Diabetes mellitus type II   . DIABETES MELLITUS, TYPE II 09/20/2006  . Diastolic congestive heart failure ([place])   . DVT of lower limb, acute ([place]) 01/12/2011  . Elevated PSA   . GERD 01/14/2007  . GERD (gastroesophageal reflux disease)   . Hyperlipidemia   . HYPERLIPIDEMIA 09/20/2006  . Hypertension   . HYPERTENSION 09/20/2006  . Hypothyroidism   . HYPOTHYROIDISM 01/14/2007  . Hypothyroidism   . 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 Nephrolithiasis   . NEPHROLITHIASIS, HX OF 01/14/2007  . OSA (obstructive sleep apnea)   . Osteoporosis   . OSTEOPOROSIS 10/08/2006  . PERIPHERAL EDEMA 09/03/2007  . Prostatic hypertrophy    benign  . RENAL INSUFFICIENCY  04/01/2010  . Rotator cuff syndrome    chronic  . Sleep apnea    CPAP dependent  . SLEEP APNEA, OBSTRUCTIVE 01/14/2007  . Thyroid nodule   . THYROID NODULE 01/14/2007  . Vitamin B 12 deficiency     Patient Active Problem List   Diagnosis Date Noted  . Degenerative arthritis of left knee 02/17/2016  . Left knee pain 02/05/2016  . Right ankle pain 01/18/2016  . Skin lesion of right arm 01/18/2016  . Lesion of ear 01/18/2016  . Abnormal stress test 01/18/2016  . Chest pain 01/10/2016  . Right leg pain 12/23/2015  . Thyroid activity decreased   . Malnutrition of moderate degree ([place]) 10/20/2014  . Hypertensive urgency 10/19/2014  . Elevated troponin 10/19/2014  . Gait disorder 09/26/2014  . Pneumonia, viral   . Hypokalemia 08/18/2014  . Diastolic congestive heart failure ([place])   . Weakness 08/17/2014  . Nausea vomiting and diarrhea 08/17/2014  . Multifactorial gait disorder 06/24/2014  . Dysphagia, pharyngoesophageal phase 12/18/2013  . Esophageal web 12/18/2013  . Thrush 10/30/2013  . Ringworm 10/30/2013  . Cough 08/25/2013  . Exertional chest pain 08/22/2013  . Nausea and vomiting 05/25/2013  . Unspecified constipation 05/02/2013  . Shuffling gait 12/17/2012  . Recurrent falls 09/05/2012  . Orthostatic hypotension 08/06/2012  . Epigastric abdominal tenderness 07/14/2012  .  Dysphagia, unspecified(787.20) 07/14/2012  . Pain with swallowing 06/28/2011  . Preventative health care 02/12/2011  . Back pain 01/23/2011  . Anticoagulation monitoring, INR range 2-3 01/08/2011  . CKD (chronic kidney disease), stage III 01/08/2011  . H/O Deep venous thrombosis - left femoral vein 01/06/2011  . B12 deficiency 05/25/2010  . Stage 3 chronic renal impairment associated with type 2 diabetes mellitus ([place]) 04/01/2010  . CONSTIPATION, RECURRENT 01/06/2010  . HEMATOCHEZIA 01/06/2010  . BRADYCARDIA 06/10/2009  . FATIGUE 05/18/2009  . HIP PAIN, RIGHT 04/01/2009  . TINNITUS 10/13/2008    . ANEMIA-NOS 06/23/2008  . Loss of weight 06/23/2008  . Other B-complex deficiencies 06/04/2008  . PARESTHESIA 06/04/2008  . PERIPHERAL EDEMA 09/03/2007  . OLECRANON BURSITIS, RIGHT 05/24/2007  . BURSITIS, RIGHT KNEE 05/24/2007  . Dementia 05/14/2007  . Depression 02/27/2007  . ARTHRITIS 02/23/2007  . DIZZINESS 01/30/2007  . THYROID NODULE 01/14/2007  . Hypothyroidism 01/14/2007  . OSA (obstructive sleep apnea) 01/14/2007  . GERD 01/14/2007  . ROTATOR CUFF SYNDROME, LEFT 01/14/2007  . PSA, INCREASED 01/14/2007  . NEPHROLITHIASIS, HX OF 01/14/2007  . BENIGN PROSTATIC HYPERTROPHY 10/08/2006  . OSTEOPOROSIS 10/08/2006  . COLONIC POLYPS, HX OF 10/08/2006  . 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 Diabetes ([place]) 09/20/2006  . Hyperlipidemia 09/20/2006  . Essential hypertension 09/20/2006  . COLON CANCER, HX OF 09/20/2006    Past Surgical History:  Procedure Laterality Date  . APPENDECTOMY  1998  . CATARACT EXTRACTION, BILATERAL    . CHOLECYSTECTOMY  1999  . COLON RESECTION  1998  . ESOPHAGOGASTRODUODENOSCOPY N/A 12/30/2013   Procedure: ESOPHAGOGASTRODUODENOSCOPY (EGD);  Surgeon: Inda Castle, MD;  Location: [facility];  Service: Endoscopy;  Laterality: N/A;  . ESOPHAGOGASTRODUODENOSCOPY (EGD) WITH PROPOFOL N/A 12/19/2013   Procedure: ESOPHAGOGASTRODUODENOSCOPY (EGD) WITH PROPOFOL;  Surgeon: Inda Castle, MD;  Location: [facility];  Service: Endoscopy;  Laterality: N/A;  . inguinal herniorrhapy  1984  . NM LEXISCAN MYOVIEW LTD  09/02/2013   Normal LV function and wall motion. EF 62%; rate dependent LBBB with Lexiscan, Low Risk fixed inferior defect suggestive of diaphragmatic attenuation and not infarct with normal wall motion.  Marland Kitchen SAVORY DILATION N/A 12/19/2013   Procedure: SAVORY DILATION;  Surgeon: Inda Castle, MD;  Location: [facility];  Service: Endoscopy;  Laterality: N/A;  With Fluoroscopy  . SAVORY DILATION N/A 12/30/2013   Procedure: SAVORY DILATION;   Surgeon: Inda Castle, MD;  Location: [facility];  Service: Endoscopy;  Laterality: N/A;  . skin cancer extraction     right ear-extensive scar  . TRANSTHORACIC ECHOCARDIOGRAM  08/03/2009   Normal LV size. Mild LVH. EF 60-65%. Gr 2 DD, mild MR.       Home Medications    Prior to Admission medications   Medication Sig Start Date End Date Taking? Authorizing Provider  aspirin EC 81 MG EC tablet Take 1 tablet (81 mg total) by mouth daily. 10/21/14  Yes Robbie Lis, MD  cholecalciferol (VITAMIN D) 1000 UNITS tablet Take 1,000 Units by mouth every morning.    Yes Historical Provider, MD  citalopram (CELEXA) 20 MG tablet TAKE 1 TABLET BY MOUTH EVERY MORNING 09/14/15  Yes Biagio Borg, MD  CREON 12000 units CPEP capsule TAKE 1 CAPSULE BY MOUTH THREE TIMES DAILY WITH MEALS 03/23/15  Yes Biagio Borg, MD  docusate sodium (COLACE) 100 MG capsule Take 100 mg by mouth.   Yes Historical Provider, MD  famotidine (PEPCID) 20 MG tablet Take 1 tablet (20 mg total)  by mouth daily. 08/20/14  Yes Robbie Lis, MD  feeding supplement, ENSURE ENLIVE, (ENSURE ENLIVE) LIQD Take 237 mLs by mouth 2 (two) times daily between meals. 10/21/14  Yes Robbie Lis, MD  furosemide (LASIX) 80 MG tablet TAKE 1 TABLET BY MOUTH TWICE DAILY Patient taking differently: TAKE 1 TABLET EVERY MORNING; 1 TABLET EVERY AFTERNOON AT 2 PM 02/07/16  Yes Biagio Borg, MD  hydrALAZINE (APRESOLINE) 25 MG tablet Take 1 tablet (25 mg total) by mouth 3 (three) times daily. 08/20/14  Yes Robbie Lis, MD  isosorbide mononitrate (IMDUR) 30 MG 24 hr tablet TAKE 1 TABLET BY MOUTH EVERY MORNING 01/17/16  Yes Biagio Borg, MD  JANUVIA 50 MG tablet TAKE 1 TABLET (50 MG TOTAL) BY MOUTH EVERY MORNING. 11/16/15  Yes Biagio Borg, MD  lansoprazole (PREVACID) 30 MG capsule TAKE 1 CAPSULE BY MOUTH EVERY MORNING 01/10/16  Yes Biagio Borg, MD  levofloxacin (LEVAQUIN) 500 MG tablet Take 500 mg by mouth daily.   Yes Historical Provider, MD  meclizine  (ANTIVERT) 12.5 MG tablet TAKE 1 TABLET BY MOUTH 3 TIMES A DAY AS NEEDED FOR DIZZINESS 07/01/15  Yes Biagio Borg, MD  meloxicam (MOBIC) 7.5 MG tablet TAKE 1 TABLET BY MOUTH EVERY DAY 03/07/16  Yes Biagio Borg, MD  OLANZapine (ZYPREXA) 5 MG tablet TAKE 1 TABLET BY MOUTH EVERY MORNING 03/07/16  Yes Biagio Borg, MD  ondansetron (ZOFRAN) 4 MG tablet Take 1 tablet (4 mg total) by mouth every 8 (eight) hours as needed for nausea or vomiting. 04/12/15  Yes Binnie Rail, MD  ONE TOUCH ULTRA TEST test strip USE TO CHECK BLOOD SUGAR ONCE A DAY AS DIRECTED 01/12/15  Yes Biagio Borg, MD  potassium chloride (K-DUR) 10 MEQ tablet Take 1 tablet (10 mEq total) by mouth daily. 08/20/14  Yes Robbie Lis, MD  predniSONE (DELTASONE) 10 MG tablet Take 10 mg by mouth 3 (three) times daily.   Yes Historical Provider, MD  QUEtiapine (SEROQUEL) 50 MG tablet TAKE 1 TABLET BY MOUTH DAILY AT BEDTIME 09/28/15  Yes Biagio Borg, MD  SYNTHROID 100 MCG tablet TAKE 1 TABLET BY MOUTH DAILY BEFORE BREAKFAST 12/27/15  Yes Biagio Borg, MD  tamsulosin (FLOMAX) 0.4 MG CAPS capsule TAKE 1 CAPSULE BY MOUTH AT BEDTIME 12/27/15  Yes Biagio Borg, MD  tiZANidine (ZANAFLEX) 4 MG tablet Take 1 tablet (4 mg total) by mouth every 6 (six) hours as needed for muscle spasms. 12/29/14  Yes Biagio Borg, MD  traMADol (ULTRAM) 50 MG tablet Take 1 tablet (50 mg total) by mouth every 8 (eight) hours as needed. 12/23/15  Yes Biagio Borg, MD  bisacodyl (DULCOLAX) 10 MG suppository Place 1 suppository (10 mg total) rectally daily as needed for moderate constipation. 10/21/14   Robbie Lis, MD  gabapentin (NEURONTIN) 100 MG capsule Take 1-2 capsules (100-200 mg total) by mouth at bedtime. 02/17/16   Lyndal Pulley, DO    Family History Family History  Problem Relation Age of Onset  . Stroke Father   . Diabetes Mother     Social History Social History  Substance Use Topics  . Smoking status: Never Smoker  . Smokeless tobacco: Never Used  . Alcohol  use No     Allergies   Biaxin [clarithromycin]; Other; and Sulfa antibiotics   Review of Systems Review of Systems   ROS 10 Systems reviewed and are negative for acute change except as  noted in the HPI.    Physical Exam Updated Vital Signs BP 120/60 (BP Location: Right Arm)   Pulse 79   Temp 98.6 F (37 C) (Oral)   Resp 17   SpO2 91%   Physical Exam  Constitutional: He is oriented to person, place, and time. He appears well-developed.  HENT:  Head: Normocephalic and atraumatic.  Eyes: Conjunctivae and EOM are normal. Pupils are equal, round, and reactive to light.  Neck: Normal range of motion. Neck supple.  Cardiovascular: Normal rate, regular rhythm and normal heart sounds.   Pulmonary/Chest: Effort normal and breath sounds normal. No respiratory distress. He has no wheezes. He has no rales.  Abdominal: Soft. Bowel sounds are normal. He exhibits no distension. There is no tenderness. There is no rebound and no guarding.  Neurological: He is alert and oriented to person, place, and time.  Skin: Skin is warm.  Nursing note and vitals reviewed.    ED Treatments / Results  Labs (all labs ordered are listed, but only abnormal results are displayed) Labs Reviewed  BASIC METABOLIC PANEL - Abnormal; Notable for the following:       Result Value   Chloride 100 (*)    Glucose, Bld 186 (*)    BUN 66 (*)    Creatinine, Ser 2.82 (*)    GFR calc non Af Amer 18 (*)    GFR calc Af Amer 21 (*)    All other components within normal limits  CBC - Abnormal; Notable for the following:    WBC 18.1 (*)    RBC 3.57 (*)    Hemoglobin 11.0 (*)    HCT 33.6 (*)    All other components within normal limits  INFLUENZA PANEL BY PCR (TYPE A & B, H1N1) - Abnormal; Notable for the following:    Influenza A By PCR POSITIVE (*)    All other components within normal limits  CBG MONITORING, ED - Abnormal; Notable for the following:    Glucose-Capillary 181 (*)    All other components  within normal limits  I-STAT TROPOININ, ED - Abnormal; Notable for the following:    Troponin i, poc 0.17 (*)    All other components within normal limits  I-STAT CG4 LACTIC ACID, ED - Abnormal; Notable for the following:    Lactic Acid, Venous 2.39 (*)    All other components within normal limits  URINE CULTURE  CULTURE, BLOOD (ROUTINE X 2)  CULTURE, BLOOD (ROUTINE X 2)  URINALYSIS, ROUTINE W REFLEX MICROSCOPIC  APTT  PROTIME-INR  CALCITONIN    EKG  EKG Interpretation  Date/Time:  Friday March 17 2016 22:58:43 EST Ventricular Rate:  75 PR Interval:    QRS Duration: 89 QT Interval:  399 QTC Calculation: 446 R Axis:   -32 Text Interpretation:  Sinus rhythm Supraventricular bigeminy Abnormal R-wave progression, early transition LVH with secondary repolarization abnormality No acute changes Confirmed by Kathrynn Humble, MD, Thelma Comp [phone]) on 03/18/2016 12:03:27 AM       Radiology Ct Head Wo Contrast  Result Date: 03/17/2016 CLINICAL DATA:  Altered mental status.  Lethargy. EXAM: CT HEAD WITHOUT CONTRAST TECHNIQUE: Contiguous axial images were obtained from the base of the skull through the vertex without intravenous contrast. COMPARISON:  06/24/2015 FINDINGS: Brain: Diffuse cerebral atrophy. Ventricular dilatation consistent with central atrophy. Low-attenuation changes in the deep white matter consistent with small vessel ischemia. No abnormal extra-axial fluid collections. No mass effect or midline shift. Gray-white matter junctions are distinct. Basal cisterns are not effaced.  No acute intracranial hemorrhage. Vascular: Vascular calcifications. Skull: Normal. Negative for fracture or focal lesion. Sinuses/Orbits: Mild mucosal thickening in the paranasal sinuses with retention cyst in the right maxillary antrum. Mastoid air cells are not opacified. Other: No significant changes since prior study. IMPRESSION: No acute intracranial abnormalities. Chronic atrophy and small vessel ischemic  changes. Electronically Signed   By: Lucienne Capers M.D.   On: 03/17/2016 23:57    Procedures Procedures (including critical care time)  CRITICAL CARE Performed by: Varney Biles   Total critical care time: 50 minutes  Critical care time was exclusive of separately billable procedures and treating other patients.  Critical care was necessary to treat or prevent imminent or life-threatening deterioration.  Critical care was time spent personally by me on the following activities: development of treatment plan with patient and/or surrogate as well as nursing, discussions with consultants, evaluation of patient's response to treatment, examination of patient, obtaining history from patient or surrogate, ordering and performing treatments and interventions, ordering and review of laboratory studies, ordering and review of radiographic studies, pulse oximetry and re-evaluation of patient's condition.   Medications Ordered in ED Medications  cefTRIAXone (ROCEPHIN) 1 g in dextrose 5 % 50 mL IVPB (1 g Intravenous New Bag/Given 03/18/16 0056)  heparin bolus via infusion 2,000 Units (not administered)  heparin ADULT infusion 100 units/mL (25000 units/254mL sodium chloride 0.45%) (not administered)  oseltamivir (TAMIFLU) capsule 75 mg (not administered)  aspirin chewable tablet 324 mg (324 mg Oral Given 03/18/16 0015)     Initial Impression / Assessment and Plan / ED Course  I have reviewed the triage vital signs and the nursing notes.  Pertinent labs & imaging results that were available during my care of the patient were reviewed by me and considered in my medical decision making (see chart for details).  Clinical Course     Pt comes in with cc of weakness, confusion. We initiated workup focusing on infection, ACS and to r.o brain bleed as there is hx of fall. Pt's trop is elevated. Heparin was started  -however, later on his flu came back positive - so heparin was discontinued. We  initiated heparin as pt has known CAD. Labs also show AKI. At this time medicine is admitting. Pt has received a dose of antibiotics -  In the ER due to elevated WC and lactate - but the antibiotics can be discontinued if the pending results come back normal.  CODE STATUS: DNR. We didn't talk intubation status at the time I saw the patient.  Final Clinical Impressions(s) / ED Diagnoses   Final diagnoses:  AKI (acute kidney injury) ([place])  Dehydration  NSTEMI (non-ST elevated myocardial infarction) ([place])  Influenza  Hypoxia    New Prescriptions New Prescriptions   No medications on file     Varney Biles, MD 03/18/16 0115

## 2016-03-18 NOTE — ED Notes (Signed)
Staff asked the pt if he could provide a urine sample, pt states that he doesn't have to go right now.

## 2016-03-18 NOTE — H&P (Signed)
Nathan Blackwell [id number] DOB: August 14, 1920 DOA: 03/17/2016     PCP: [name], MD   Outpatient Specialists: [facility] [place]   Patient coming from:    home Lives  With family    Chief Complaint: generalized weakness  HPI: Nathan Blackwell is a 83 y.o. male with medical history significant of CAD, HTN, DM2,CKD,hx of DVT 0000000, GERD, diastolic CHF, OSA on C Pap    Presented with generalized weakness,decreased PO intake cough for the past few days. Lately he has been having some knee pain and ankle pain which have contributed to his inability to ambulate he was diagnosed with arthritis and treated with injections. His baseline patient is fairly active Day he had a fall in the bathtub unsure if he hit his head. Family reports he is getting more and more lethargic and for the past one they have hardly able to get up and walk around. Family reports that everybody in the household had a flu  Examination has been having trouble urinating. He denies any shortness of breath   Regarding pertinent Chronic problems: Examination has no history of coronary artery disease currently on medical management has history of diabetes not on insulin takes Januvia. At his baseline patient able to ambulate. Patient has history of sleep apnea requiring C Pap   IN ER:  Temp (24hrs), Avg:98.6 F (37 C), Min:98.6 F (37 C), Max:98.6 F (37 C)      RRR 17 satting 91% on 2 L PP 120/16 Lactic acid 2.39 troponin 0.17 Influenza PCR obtained positive Creatinine 2.82 which is up from baseline of 2.0 WBC 18.1 hemoglobin 11 which is at baseline CT head negative for hemorrhage Chest x-ray showing infiltrate worrisome for pneumonia  Following Medications were ordered in ER: Medications  aspirin chewable tablet 324 mg (not administered)     ER provider discussed case with: Cardiology   Hospitalist was called for admission for sepsis secondary to influenza and possibly secondary infection  Review of Systems:      Pertinent positives include: chills, fatigue, shortness of breath at rest.  Constitutional:  No weight loss, night sweats, Fevers,  weight loss  HEENT:  No headaches, Difficulty swallowing,Tooth/dental problems,Sore throat,  No sneezing, itching, ear ache, nasal congestion, post nasal drip,  Cardio-vascular:  No chest pain, Orthopnea, PND, anasarca, dizziness, palpitations.no Bilateral lower extremity swelling  GI:  No heartburn, indigestion, abdominal pain, nausea, vomiting, diarrhea, change in bowel habits, loss of appetite, melena, blood in stool, hematemesis Resp:  no  No dyspnea on exertion, No excess mucus, no productive cough, No non-productive cough, No coughing up of blood.No change in color of mucus.No wheezing. Skin:  no rash or lesions. No jaundice GU:  no dysuria, change in color of urine, no urgency or frequency. No straining to urinate.  No flank pain.  Musculoskeletal:  No joint pain or no joint swelling. No decreased range of motion. No back pain.  Psych:  No change in mood or affect. No depression or anxiety. No memory loss.  Neuro: no localizing neurological complaints, no tingling, no weakness, no double vision, no gait abnormality, no slurred speech, no confusion  As per HPI otherwise 10 point review of systems negative.   Past Medical History: Past Medical History:  Diagnosis Date  . Anemia    NOS  . ANEMIA-NOS 06/23/2008  . ARTHRITIS 02/23/2007  . B12 deficiency 05/25/2010  . BACK PAIN, THORACIC REGION 06/04/2008  . BENIGN PROSTATIC HYPERTROPHY 10/08/2006  . BRADYCARDIA 06/10/2009  .  Cancer of colon ([place]) dx'd 1998  . 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 CLOSTRIDIUM DIFFICILE COLITIS 01/30/2007  . Colitis, Clostridium difficile    presumed 11/08  . COLON CANCER, HX OF 09/20/2006  . COLONIC POLYPS, HX OF 10/08/2006  . CONSTIPATION, RECURRENT 01/06/2010  . Dementia   . DEMENTIA 05/14/2007  . DEPRESSION 02/27/2007  . Diabetes mellitus ([place])   . Diabetes  mellitus type II   . DIABETES MELLITUS, TYPE II 09/20/2006  . Diastolic congestive heart failure ([place])   . DVT of lower limb, acute ([place]) 01/12/2011  . Elevated PSA   . GERD 01/14/2007  . GERD (gastroesophageal reflux disease)   . Hyperlipidemia   . HYPERLIPIDEMIA 09/20/2006  . Hypertension   . HYPERTENSION 09/20/2006  . Hypothyroidism   . HYPOTHYROIDISM 01/14/2007  . Hypothyroidism   . 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 Nephrolithiasis   . NEPHROLITHIASIS, HX OF 01/14/2007  . OSA (obstructive sleep apnea)   . Osteoporosis   . OSTEOPOROSIS 10/08/2006  . PERIPHERAL EDEMA 09/03/2007  . Prostatic hypertrophy    benign  . RENAL INSUFFICIENCY 04/01/2010  . Rotator cuff syndrome    chronic  . Sleep apnea    CPAP dependent  . SLEEP APNEA, OBSTRUCTIVE 01/14/2007  . Thyroid nodule   . THYROID NODULE 01/14/2007  . Vitamin B 12 deficiency    Past Surgical History:  Procedure Laterality Date  . APPENDECTOMY  1998  . CATARACT EXTRACTION, BILATERAL    . CHOLECYSTECTOMY  1999  . COLON RESECTION  1998  . ESOPHAGOGASTRODUODENOSCOPY N/A 12/30/2013   Procedure: ESOPHAGOGASTRODUODENOSCOPY (EGD);  Surgeon: Inda Castle, MD;  Location: [facility];  Service: Endoscopy;  Laterality: N/A;  . ESOPHAGOGASTRODUODENOSCOPY (EGD) WITH PROPOFOL N/A 12/19/2013   Procedure: ESOPHAGOGASTRODUODENOSCOPY (EGD) WITH PROPOFOL;  Surgeon: Inda Castle, MD;  Location: [facility];  Service: Endoscopy;  Laterality: N/A;  . inguinal herniorrhapy  1984  . NM LEXISCAN MYOVIEW LTD  09/02/2013   Normal LV function and wall motion. EF 62%; rate dependent LBBB with Lexiscan, Low Risk fixed inferior defect suggestive of diaphragmatic attenuation and not infarct with normal wall motion.  Marland Kitchen SAVORY DILATION N/A 12/19/2013   Procedure: SAVORY DILATION;  Surgeon: Inda Castle, MD;  Location: [facility];  Service: Endoscopy;  Laterality: N/A;  With Fluoroscopy  . SAVORY DILATION N/A 12/30/2013   Procedure: SAVORY  DILATION;  Surgeon: Inda Castle, MD;  Location: [facility];  Service: Endoscopy;  Laterality: N/A;  . skin cancer extraction     right ear-extensive scar  . TRANSTHORACIC ECHOCARDIOGRAM  08/03/2009   Normal LV size. Mild LVH. EF 60-65%. Gr 2 DD, mild MR.     Social History:  Ambulatory   independently or cane     reports that he has never smoked. He has never used smokeless tobacco. He reports that he does not drink alcohol or use drugs.  Allergies:   Allergies  Allergen Reactions  . Biaxin [Clarithromycin] Other (See Comments)    Unknown   . Other Other (See Comments)    Nephrologist has told patient not to eat many different foods (tomatoes, [employer], etc).   . Sulfa Antibiotics Other (See Comments)    Unknown        Family History:   Family History  Problem Relation Age of Onset  . Stroke Father   . Diabetes Mother     Medications: Prior to Admission medications   Medication Sig Start Date End Date Taking? Authorizing  Provider  aspirin EC 81 MG EC tablet Take 1 tablet (81 mg total) by mouth daily. 10/21/14  Yes Robbie Lis, MD  cholecalciferol (VITAMIN D) 1000 UNITS tablet Take 1,000 Units by mouth every morning.    Yes Historical Provider, MD  citalopram (CELEXA) 20 MG tablet TAKE 1 TABLET BY MOUTH EVERY MORNING 09/14/15  Yes Biagio Borg, MD  CREON 12000 units CPEP capsule TAKE 1 CAPSULE BY MOUTH THREE TIMES DAILY WITH MEALS 03/23/15  Yes Biagio Borg, MD  docusate sodium (COLACE) 100 MG capsule Take 100 mg by mouth.   Yes Historical Provider, MD  famotidine (PEPCID) 20 MG tablet Take 1 tablet (20 mg total) by mouth daily. 08/20/14  Yes Robbie Lis, MD  feeding supplement, ENSURE ENLIVE, (ENSURE ENLIVE) LIQD Take 237 mLs by mouth 2 (two) times daily between meals. 10/21/14  Yes Robbie Lis, MD  furosemide (LASIX) 80 MG tablet TAKE 1 TABLET BY MOUTH TWICE DAILY Patient taking differently: TAKE 1 TABLET EVERY MORNING; 1 TABLET EVERY AFTERNOON AT 2 PM  02/07/16  Yes Biagio Borg, MD  hydrALAZINE (APRESOLINE) 25 MG tablet Take 1 tablet (25 mg total) by mouth 3 (three) times daily. 08/20/14  Yes Robbie Lis, MD  isosorbide mononitrate (IMDUR) 30 MG 24 hr tablet TAKE 1 TABLET BY MOUTH EVERY MORNING 01/17/16  Yes Biagio Borg, MD  JANUVIA 50 MG tablet TAKE 1 TABLET (50 MG TOTAL) BY MOUTH EVERY MORNING. 11/16/15  Yes Biagio Borg, MD  lansoprazole (PREVACID) 30 MG capsule TAKE 1 CAPSULE BY MOUTH EVERY MORNING 01/10/16  Yes Biagio Borg, MD  levofloxacin (LEVAQUIN) 500 MG tablet Take 500 mg by mouth daily.   Yes Historical Provider, MD  meclizine (ANTIVERT) 12.5 MG tablet TAKE 1 TABLET BY MOUTH 3 TIMES A DAY AS NEEDED FOR DIZZINESS 07/01/15  Yes Biagio Borg, MD  meloxicam (MOBIC) 7.5 MG tablet TAKE 1 TABLET BY MOUTH EVERY DAY 03/07/16  Yes Biagio Borg, MD  OLANZapine (ZYPREXA) 5 MG tablet TAKE 1 TABLET BY MOUTH EVERY MORNING 03/07/16  Yes Biagio Borg, MD  ondansetron (ZOFRAN) 4 MG tablet Take 1 tablet (4 mg total) by mouth every 8 (eight) hours as needed for nausea or vomiting. 04/12/15  Yes Binnie Rail, MD  ONE TOUCH ULTRA TEST test strip USE TO CHECK BLOOD SUGAR ONCE A DAY AS DIRECTED 01/12/15  Yes Biagio Borg, MD  potassium chloride (K-DUR) 10 MEQ tablet Take 1 tablet (10 mEq total) by mouth daily. 08/20/14  Yes Robbie Lis, MD  predniSONE (DELTASONE) 10 MG tablet Take 10 mg by mouth 3 (three) times daily.   Yes Historical Provider, MD  QUEtiapine (SEROQUEL) 50 MG tablet TAKE 1 TABLET BY MOUTH DAILY AT BEDTIME 09/28/15  Yes Biagio Borg, MD  SYNTHROID 100 MCG tablet TAKE 1 TABLET BY MOUTH DAILY BEFORE BREAKFAST 12/27/15  Yes Biagio Borg, MD  tamsulosin (FLOMAX) 0.4 MG CAPS capsule TAKE 1 CAPSULE BY MOUTH AT BEDTIME 12/27/15  Yes Biagio Borg, MD  tiZANidine (ZANAFLEX) 4 MG tablet Take 1 tablet (4 mg total) by mouth every 6 (six) hours as needed for muscle spasms. 12/29/14  Yes Biagio Borg, MD  traMADol (ULTRAM) 50 MG tablet Take 1 tablet (50 mg total)  by mouth every 8 (eight) hours as needed. 12/23/15  Yes Biagio Borg, MD  bisacodyl (DULCOLAX) 10 MG suppository Place 1 suppository (10 mg total) rectally daily as needed for  moderate constipation. 10/21/14   Robbie Lis, MD  gabapentin (NEURONTIN) 100 MG capsule Take 1-2 capsules (100-200 mg total) by mouth at bedtime. 02/17/16   Lyndal Pulley, DO    Physical Exam: Patient Vitals for the past 24 hrs:  BP Temp Temp src Pulse Resp SpO2  03/17/16 2335 120/60 - - 79 17 91 %  03/17/16 2049 119/62 98.6 F (37 C) Oral 84 10 93 %  03/17/16 2044 - - - - - 94 %    1. General:  in No Acute distress 2. Psychological: Lethargic but  Oriented 3. Head/ENT:  Dry Mucous Membranes                          Head Non traumatic, neck supple                           Poor Dentition 4. SKIN:   decreased Skin turgor,  Skin clean Dry and intact no rash 5. Heart: Regular rate and rhythm no  Murmur, Rub or gallop 6. Lungs: Coarse breath sounds no wheezes or crackles   7. Abdomen: Soft,  non-tender,  distended 8. Lower extremities: no clubbing, cyanosis,2+ edema left worse than right per family patient has been told this is secondary to arthritis 9. Neurologically Grossly intact, moving all 4 extremities equally  10. MSK: Normal range of motion   body mass index is unknown because there is no height or weight on file.  Labs on Admission:   Labs on Admission: I have personally reviewed following labs and imaging studies  CBC:  Recent Labs Lab 03/17/16 2308  WBC 18.1*  HGB 11.0*  HCT 33.6*  MCV 94.1  PLT 123456   Basic Metabolic Panel:  Recent Labs Lab 03/17/16 2308  NA 143  K 3.6  CL 100*  CO2 29  GLUCOSE 186*  BUN 66*  CREATININE 2.82*  CALCIUM 9.0   GFR: Estimated Creatinine Clearance: 16.1 mL/min (by C-G formula based on SCr of 2.82 mg/dL (H)). Liver Function Tests: No results for input(s): AST, ALT, ALKPHOS, BILITOT, PROT, ALBUMIN in the last 168 hours. No results for input(s):  LIPASE, AMYLASE in the last 168 hours. No results for input(s): AMMONIA in the last 168 hours. Coagulation Profile: No results for input(s): INR, PROTIME in the last 168 hours. Cardiac Enzymes: No results for input(s): CKTOTAL, CKMB, CKMBINDEX, TROPONINI in the last 168 hours. BNP (last 3 results) No results for input(s): PROBNP in the last 8760 hours. HbA1C: No results for input(s): HGBA1C in the last 72 hours. CBG:  Recent Labs Lab 03/17/16 2331  GLUCAP 181*   Lipid Profile: No results for input(s): CHOL, HDL, LDLCALC, TRIG, CHOLHDL, LDLDIRECT in the last 72 hours. Thyroid Function Tests: No results for input(s): TSH, T4TOTAL, FREET4, T3FREE, THYROIDAB in the last 72 hours. Anemia Panel: No results for input(s): VITAMINB12, FOLATE, FERRITIN, TIBC, IRON, RETICCTPCT in the last 72 hours.  Sepsis Labs: @LABRCNTIP (procalcitonin:4,lacticidven:4) )No results found for this or any previous visit (from the past 240 hour(s)).    UA  ordered  Lab Results  Component Value Date   HGBA1C 7.2 (H) 12/23/2015    Estimated Creatinine Clearance: 16.1 mL/min (by C-G formula based on SCr of 2.82 mg/dL (H)).  BNP (last 3 results) No results for input(s): PROBNP in the last 8760 hours.   ECG REPORT  Independently reviewed Rate: 76  Rhythm: [place] ST&T Change: No acute  ischemic changes   QTC 446  There were no vitals filed for this visit.   Cultures:    Component Value Date/Time   SDES URINE, RANDOM 08/18/2014 0315   SPECREQUEST NONE 08/18/2014 0315   CULT  08/18/2014 0149    NO GROWTH 5 DAYS Performed at [facility] 08/19/2014 FINAL 08/18/2014 0315     Radiological Exams on Admission: Ct Head Wo Contrast  Result Date: 03/17/2016 CLINICAL DATA:  Altered mental status.  Lethargy. EXAM: CT HEAD WITHOUT CONTRAST TECHNIQUE: Contiguous axial images were obtained from the base of the skull through the vertex without intravenous contrast. COMPARISON:   06/24/2015 FINDINGS: Brain: Diffuse cerebral atrophy. Ventricular dilatation consistent with central atrophy. Low-attenuation changes in the deep white matter consistent with small vessel ischemia. No abnormal extra-axial fluid collections. No mass effect or midline shift. Gray-white matter junctions are distinct. Basal cisterns are not effaced. No acute intracranial hemorrhage. Vascular: Vascular calcifications. Skull: Normal. Negative for fracture or focal lesion. Sinuses/Orbits: Mild mucosal thickening in the paranasal sinuses with retention cyst in the right maxillary antrum. Mastoid air cells are not opacified. Other: No significant changes since prior study. IMPRESSION: No acute intracranial abnormalities. Chronic atrophy and small vessel ischemic changes. Electronically Signed   By: Lucienne Capers M.D.   On: 03/17/2016 23:57    Chart has been reviewed    Assessment/Plan  83 y.o. male with medical history significant of CAD, HTN, DM2,CKD,hx of DVT 0000000, GERD, diastolic CHF, OSA on C Pap  being admitted for  sepsis secondary to influenza and possibly secondary infection and elevated troponin    Present on Admission: . Influenza A - supportive management will start Tamiflu . CKD (chronic kidney disease), stage III acute on chronic renal failure likely secondary to dehydration   Sepsis - secondary to influenza versus pneumonia will cover with broad-spectrum antibiotics and administrating fluids obtain blood cultures and MRSA screening is negative we'll need to adjust antibiotic coverage  Community-acquired pneumonia likely sequela of influenza cover for sepsis obtain blood cultures . Diastolic congestive heart failure (HCC) hold Lasix while appears to be dehydrated . Dysphagia, pharyngoesophageal phase aspiration precautions . Elevated troponin/NSTEMI- salt cardiology continue to cycle cardiac enzymes currently chest pain-free. Given significant elevation will initiate heparin can be  discontinued next 24 hours area discussed with family who agrees at this point patient would not be good candidate for cardiac catheterization given acute renal failure and acute illness. Order echo gram . Essential hypertension allow some permissive hypertension and monitor for any sign of hypotension . H/O Deep venous thrombosis - left femoral vein,  remote for now on heparin for prophylaxis . Malnutrition of moderate degree ([place]) continue ensure   . OSA (obstructive sleep apnea) continue CPAP . Stage 3 chronic renal impairment associated with type 2 diabetes mellitus ([place]) obtain urine studies rehydrate and follow creatinine Leg edema given decreased movement will check for DVT Diabetes mellitus hold Januvia order sliding scale Abdominal distention patient have had constipation as well as urinary retention will obtain KUB to evaluate for stool burden Urinary retention - will Place Foley catheter follow creatinine History of arthritis on steroids likely causing leukocytosis. We'll not stop abruptly to avoid adrenal insufficiency try to wean down if possible Dehydration will rehydrate patient mental status or rarely has improved since he been an [facility] Other plan as per orders.  DVT prophylaxis:  Heparin  Code Status:   DNR/DNI as per family   Family Communication:  Family   at  Bedside  plan of care was discussed with   Daughter   Disposition Plan:    To home once workup is complete and patient is stable                         Would benefit from PT/OT eval prior to DC ordered                                                Consults called: emailed cardiology  Admission status:    inpatient       Level of care    tele          I have spent a total of 57 min on this admission    Hiawatha Dressel 03/18/2016, 1:47 AM    [facility]  Pager [phone]   after 2 AM please page floor coverage PA If 7AM-7PM, please contact the day team taking care of the patient   [email or web address]  Password [email or web address]

## 2016-03-18 NOTE — Progress Notes (Signed)
RT spoke with pt pertaining to CPAP.  Pt seems confused and is unable to tell me if he uses CPAP or not.  Pt currently on room air and tolerating well at this time.  RT will hold CPAP at this time.  RT discussed with RN, RT to monitor and assess as needed.

## 2016-03-18 NOTE — Progress Notes (Signed)
ANTICOAGULATION CONSULT NOTE - Initial Consult  Pharmacy Consult for IV heparin Indication: chest pain/ACS  Allergies  Allergen Reactions  . Biaxin [Clarithromycin] Other (See Comments)    Unknown   . Other Other (See Comments)    Nephrologist has told patient not to eat many different foods (tomatoes, [employer], etc).   . Sulfa Antibiotics Other (See Comments)    Unknown     Patient Measurements:   Heparin Dosing Weight: 69.4 (TBW)  Vital Signs: Temp: 98.6 F (37 C) (01/12 2049) Temp Source: Oral (01/12 2049) BP: 120/60 (01/12 2335) Pulse Rate: 79 (01/12 2335)  Labs:  Recent Labs  03/17/16 2308  HGB 11.0*  HCT 33.6*  PLT 230  CREATININE 2.82*    Estimated Creatinine Clearance: 16.1 mL/min (by C-G formula based on SCr of 2.82 mg/dL (H)).   Medical History: Past Medical History:  Diagnosis Date  . Anemia    NOS  . ANEMIA-NOS 06/23/2008  . ARTHRITIS 02/23/2007  . B12 deficiency 05/25/2010  . BACK PAIN, THORACIC REGION 06/04/2008  . BENIGN PROSTATIC HYPERTROPHY 10/08/2006  . BRADYCARDIA 06/10/2009  . Cancer of colon ([place]) dx'd 1998  . 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 CLOSTRIDIUM DIFFICILE COLITIS 01/30/2007  . Colitis, Clostridium difficile    presumed 11/08  . COLON CANCER, HX OF 09/20/2006  . COLONIC POLYPS, HX OF 10/08/2006  . CONSTIPATION, RECURRENT 01/06/2010  . Dementia   . DEMENTIA 05/14/2007  . DEPRESSION 02/27/2007  . Diabetes mellitus ([place])   . Diabetes mellitus type II   . DIABETES MELLITUS, TYPE II 09/20/2006  . Diastolic congestive heart failure ([place])   . DVT of lower limb, acute ([place]) 01/12/2011  . Elevated PSA   . GERD 01/14/2007  . GERD (gastroesophageal reflux disease)   . Hyperlipidemia   . HYPERLIPIDEMIA 09/20/2006  . Hypertension   . HYPERTENSION 09/20/2006  . Hypothyroidism   . HYPOTHYROIDISM 01/14/2007  . Hypothyroidism   . 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 Nephrolithiasis   . NEPHROLITHIASIS, HX OF 01/14/2007  . OSA  (obstructive sleep apnea)   . Osteoporosis   . OSTEOPOROSIS 10/08/2006  . PERIPHERAL EDEMA 09/03/2007  . Prostatic hypertrophy    benign  . RENAL INSUFFICIENCY 04/01/2010  . Rotator cuff syndrome    chronic  . Sleep apnea    CPAP dependent  . SLEEP APNEA, OBSTRUCTIVE 01/14/2007  . Thyroid nodule   . THYROID NODULE 01/14/2007  . Vitamin B 12 deficiency     Medications:  Scheduled:   Infusions:  . cefTRIAXone (ROCEPHIN)  IV      Assessment: 30 yoM c/o progressive lethargy.  Now with elevated troponin=0.17.  IV heparin per Rx for ACS.   Goal of Therapy:  Heparin level 0.3-0.7 units/ml Monitor platelets by anticoagulation protocol: Yes   Plan:  Baseline coags:  aPtt and PT/INR STAT Heparin 2000 unit bolus x1 now Then heparin drip at 800 units/hr Daily CBC/HL Check 1st HL in 8 hours  Dorrene German 03/18/2016,12:30 AM

## 2016-03-18 NOTE — Progress Notes (Signed)
ANTICOAGULATION CONSULT NOTE - Follow Up  Pharmacy Consult for IV heparin Indication: chest pain/ACS  Allergies  Allergen Reactions  . Biaxin [Clarithromycin] Other (See Comments)    Unknown   . Other Other (See Comments)    Nephrologist has told patient not to eat many different foods (tomatoes, [employer], etc).   . Sulfa Antibiotics Other (See Comments)    Unknown     Patient Measurements: Height: 5\' 10"  (177.8 cm) Weight: 148 lb 2.4 oz (67.2 kg) IBW/kg (Calculated) : 73 Heparin Dosing Weight: 69.4 (TBW)  Vital Signs: Temp: 98 F (36.7 C) (01/13 0800) Temp Source: Oral (01/13 0800) BP: 105/46 (01/13 0500) Pulse Rate: 75 (01/13 0200)  Labs:  Recent Labs  03/17/16 2308 03/18/16 0033 03/18/16 0116 03/18/16 0337 03/18/16 0724 03/18/16 1107  HGB 11.0*  --   --  10.3*  --   --   HCT 33.6*  --   --  31.5*  --   --   PLT 230  --   --  210  --   --   APTT  --  31  --   --   --   --   LABPROT  --  14.3  --   --   --   --   INR  --  1.10  --   --   --   --   HEPARINUNFRC  --   --   --   --   --  0.58  CREATININE 2.82*  --   --  2.80*  --   --   CKTOTAL  --   --  718*  --   --   --   TROPONINI  --   --  0.13*  --  0.12*  --     Estimated Creatinine Clearance: 15.7 mL/min (by C-G formula based on SCr of 2.8 mg/dL (H)).   Medical History: Past Medical History:  Diagnosis Date  . Anemia    NOS  . ANEMIA-NOS 06/23/2008  . ARTHRITIS 02/23/2007  . B12 deficiency 05/25/2010  . BACK PAIN, THORACIC REGION 06/04/2008  . BENIGN PROSTATIC HYPERTROPHY 10/08/2006  . BRADYCARDIA 06/10/2009  . Cancer of colon ([place]) dx'd 1998  . 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 CLOSTRIDIUM DIFFICILE COLITIS 01/30/2007  . Colitis, Clostridium difficile    presumed 11/08  . COLON CANCER, HX OF 09/20/2006  . COLONIC POLYPS, HX OF 10/08/2006  . CONSTIPATION, RECURRENT 01/06/2010  . Dementia   . DEMENTIA 05/14/2007  . DEPRESSION 02/27/2007  . Diabetes mellitus ([place])   . Diabetes mellitus  type II   . DIABETES MELLITUS, TYPE II 09/20/2006  . Diastolic congestive heart failure ([place])   . DVT of lower limb, acute ([place]) 01/12/2011  . Elevated PSA   . GERD 01/14/2007  . GERD (gastroesophageal reflux disease)   . Hyperlipidemia   . HYPERLIPIDEMIA 09/20/2006  . Hypertension   . HYPERTENSION 09/20/2006  . Hypothyroidism   . HYPOTHYROIDISM 01/14/2007  . Hypothyroidism   . 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 Nephrolithiasis   . NEPHROLITHIASIS, HX OF 01/14/2007  . OSA (obstructive sleep apnea)   . Osteoporosis   . OSTEOPOROSIS 10/08/2006  . PERIPHERAL EDEMA 09/03/2007  . Prostatic hypertrophy    benign  . RENAL INSUFFICIENCY 04/01/2010  . Rotator cuff syndrome    chronic  . Sleep apnea    CPAP dependent  . SLEEP APNEA, OBSTRUCTIVE 01/14/2007  . Thyroid nodule   . THYROID NODULE 01/14/2007  .  Vitamin B 12 deficiency     Medications:  Scheduled:  . aspirin EC  81 mg Oral Daily  . citalopram  20 mg Oral q morning - 10a  . famotidine  20 mg Oral Daily  . feeding supplement (ENSURE ENLIVE)  237 mL Oral BID BM  . guaiFENesin  600 mg Oral BID  . isosorbide mononitrate  30 mg Oral q morning - 10a  . levothyroxine  100 mcg Oral QAC breakfast  . lipase/protease/amylase  12,000 Units Oral TID WC  . [START ON 03/19/2016] OLANZapine  5 mg Oral q morning - 10a  . oseltamivir  30 mg Oral Daily  . pantoprazole  20 mg Oral Daily  . piperacillin-tazobactam (ZOSYN)  IV  2.25 g Intravenous Q6H  . predniSONE  10 mg Oral BID WC  . [START ON 03/19/2016] QUEtiapine  50 mg Oral QHS  . sodium chloride flush  3 mL Intravenous Q12H  . tamsulosin  0.4 mg Oral QHS  . [START ON 03/20/2016] vancomycin  1,000 mg Intravenous Q48H   Infusions:  . sodium chloride 75 mL/hr at 03/18/16 0600  . heparin 800 Units/hr (03/18/16 0600)    Assessment: 71 yoM c/o progressive lethargy.  Now with elevated troponin=0.17.  IV heparin per Rx for ACS.   Today, 03/18/16  First heparin level therapeutic on  current rate of 800 units/hr  Hgb, Plts stable  No reported bleeding  Goal of Therapy:  Heparin level 0.3-0.7 units/ml Monitor platelets by anticoagulation protocol: Yes   Plan:  1) Continue current IV heparin rate of 800 units/hr 2) Recheck heparin level in 8 hours to confirm continued therapeutic level at current rate 3) Daily CBC and heparin level 4) Per cards, if troponin continues to trend downward can discontinue heparin   Adrian Saran, PharmD, BCPS Pager [phone] 03/18/2016 12:07 PM

## 2016-03-18 NOTE — ED Notes (Signed)
Notified EDP,Nanavati,MD. Pt. I-stat CG4 Lactic acid 2.39 and RN,Rick made aware.

## 2016-03-18 NOTE — Progress Notes (Signed)
**  Preliminary report by tech**  Left lower extremity venous duplex complete. There is no evidence of deep or superficial vein thrombosis involving the left lower extremity. All visualized vessels appear patent and compressible. There is no evidence of a Baker's cyst on the left. Results were given to the patient's nurse, [place].  03/18/16 3:27 PM Nathan Blackwell RVT

## 2016-03-19 ENCOUNTER — Inpatient Hospital Stay (HOSPITAL_COMMUNITY): Payer: Medicare Other

## 2016-03-19 DIAGNOSIS — E1122 Type 2 diabetes mellitus with diabetic chronic kidney disease: Secondary | ICD-10-CM

## 2016-03-19 DIAGNOSIS — I214 Non-ST elevation (NSTEMI) myocardial infarction: Secondary | ICD-10-CM

## 2016-03-19 LAB — BASIC METABOLIC PANEL
Anion gap: 12 (ref 5–15)
BUN: 61 mg/dL — AB (ref 6–20)
CALCIUM: 7.8 mg/dL — AB (ref 8.9–10.3)
CO2: 27 mmol/L (ref 22–32)
Chloride: 109 mmol/L (ref 101–111)
Creatinine, Ser: 2.53 mg/dL — ABNORMAL HIGH (ref 0.61–1.24)
GFR calc Af Amer: 24 mL/min — ABNORMAL LOW (ref >60)
GFR, EST NON AFRICAN AMERICAN: 20 mL/min — AB (ref >60)
GLUCOSE: 115 mg/dL — AB (ref 65–99)
POTASSIUM: 2.9 mmol/L — AB (ref 3.5–5.1)
Sodium: 148 mmol/L — ABNORMAL HIGH (ref 135–145)

## 2016-03-19 LAB — CBC
HEMATOCRIT: 28.6 % — AB (ref 39.0–52.0)
HEMOGLOBIN: 9.4 g/dL — AB (ref 13.0–17.0)
MCH: 31.6 pg (ref 26.0–34.0)
MCHC: 32.9 g/dL (ref 30.0–36.0)
MCV: 96.3 fL (ref 78.0–100.0)
Platelets: 194 10*3/uL (ref 150–400)
RBC: 2.97 MIL/uL — ABNORMAL LOW (ref 4.22–5.81)
RDW: 14.3 % (ref 11.5–15.5)
WBC: 11.3 10*3/uL — ABNORMAL HIGH (ref 4.0–10.5)

## 2016-03-19 LAB — URINE CULTURE: CULTURE: NO GROWTH

## 2016-03-19 LAB — BLOOD GAS, ARTERIAL
Acid-Base Excess: 3.7 mmol/L — ABNORMAL HIGH (ref 0.0–2.0)
Bicarbonate: 27.3 mmol/L (ref 20.0–28.0)
Drawn by: 103701
O2 Content: 3 L/min
O2 SAT: 95.4 %
PCO2 ART: 39.2 mmHg (ref 32.0–48.0)
PO2 ART: 78.8 mmHg — AB (ref 83.0–108.0)
Patient temperature: 98.6
pH, Arterial: 7.457 — ABNORMAL HIGH (ref 7.350–7.450)

## 2016-03-19 LAB — HEMOGLOBIN A1C
HEMOGLOBIN A1C: 7.1 % — AB (ref 4.8–5.6)
Mean Plasma Glucose: 157 mg/dL

## 2016-03-19 LAB — HEPARIN LEVEL (UNFRACTIONATED): HEPARIN UNFRACTIONATED: 0.55 [IU]/mL (ref 0.30–0.70)

## 2016-03-19 MED ORDER — HYDRALAZINE HCL 20 MG/ML IJ SOLN
5.0000 mg | Freq: Four times a day (QID) | INTRAMUSCULAR | Status: DC | PRN
  Administered 2016-03-19 – 2016-03-21 (×2): 5 mg via INTRAVENOUS
  Filled 2016-03-19 (×2): qty 1

## 2016-03-19 MED ORDER — ENOXAPARIN SODIUM 30 MG/0.3ML ~~LOC~~ SOLN
30.0000 mg | SUBCUTANEOUS | Status: DC
  Administered 2016-03-19 – 2016-03-23 (×5): 30 mg via SUBCUTANEOUS
  Filled 2016-03-19 (×5): qty 0.3

## 2016-03-19 MED ORDER — KCL IN DEXTROSE-NACL 40-5-0.45 MEQ/L-%-% IV SOLN
INTRAVENOUS | Status: DC
  Administered 2016-03-19 – 2016-03-21 (×3): via INTRAVENOUS
  Filled 2016-03-19 (×5): qty 1000

## 2016-03-19 NOTE — Progress Notes (Signed)
Mild troponin elevation in setting of influenza sepsis that has peaked and now trending down. . Echo with normal LVEF, no WMAsSuspect demand ischemia. Advanced age, poor functional status, poor renal function would make patient a poor cath candidate at any point. Would not pursue ischemic testing, recommend continued medical management of sepsis. Can stop heparin. We will sign off inpatient care.   Zandra Abts MD

## 2016-03-19 NOTE — Progress Notes (Signed)
PROGRESS NOTE    Nathan Blackwell  [id number] DOB: Feb 12, 1921 DOA: 03/17/2016 PCP: [name], MD   Brief Narrative: Nathan Blackwell is a 83 y.o. male with a history of CAD, hypertension, diabetes mellitus type 2, CKD, history of DVT, GERD, diastolic CHF, OSA on CPAP. Patient presented with dries weakness and found to have a flow is a infection and possible concurrent community acquired pneumonia.   Assessment & Plan:   Active Problems:   Dementia   OSA (obstructive sleep apnea)   Essential hypertension   Stage 3 chronic renal impairment associated with type 2 diabetes mellitus (HCC)   H/O Deep venous thrombosis - left femoral vein   CKD (chronic kidney disease), stage III   Dysphagia, pharyngoesophageal phase   Diastolic congestive heart failure (HCC)   Elevated troponin   Malnutrition of moderate degree (HCC)   Influenza A   NSTEMI (non-ST elevated myocardial infarction) (HCC)   Pressure injury of skin   Influenza A infection -continue Tamiflu  Acute on CKD stage 3 Baseline creatinine of about 1.6-2. 2.82 on admission and trending down. -Continue IV hydration -Encourage by mouth intake as tolerated  Community acquired pneumonia -continue vancomycin and zosyn  Acute respiratory failure with hypoxia Likely secondary to CAP and influenza. -ABG  Essential hypertension Elevated blood pressure. -hydralazine PRN for SBP >180  Nausea vomiting Abdominal distension -KUB  Diastolic heart failure Grade 1 diastolic. Last EF of 55% seen on echo from 03/18/2016.  Malnutrition of moderate severity  OSA -continue CPAP  Leg edema LE ultrasound negative for DVT -elevate legs  Dementia -continue Zyprexa -continue Celexa  BPH -conitnue Flomax  Elevated troponin Likely secondary to demand ischemia from sepsis picture. Cardiology was initially consult did and agrees. No further intervention.   DVT prophylaxis: Lovenox  Code Status: DO NOT RESUSCITATE  Family  Communication: None at bedside  Disposition Plan: Discharge pending clinical improvement    Consultants:   Cardiology  Procedures:   Echocardiogram (03/19/2016)  Antimicrobials:  Ankle mycin  Zosyn  Tamiflu    Subjective: Patient reports abdominal pain today.  Objective: Vitals:   03/19/16 0321 03/19/16 0400 03/19/16 0500 03/19/16 0653  BP:  (!) 153/50 (!) 145/54   Pulse:      Resp:  16 17   Temp: 99.6 F (37.6 C)   97.5 F (36.4 C)  TempSrc: Axillary   Oral  SpO2:  95% 97%   Weight:      Height:        Intake/Output Summary (Last 24 hours) at 03/19/16 0804 Last data filed at 03/19/16 0756  Gross per 24 hour  Intake          1462.88 ml  Output             1550 ml  Net           -87.12 ml   Filed Weights   03/18/16 0400  Weight: 67.2 kg (148 lb 2.4 oz)    Examination:  General exam: Appears calm and comfortable Respiratory system: Clear to auscultation. Respiratory effort normal. Cardiovascular system: S1 & S2 heard, RRR. No murmurs, rubs, gallops or clicks. Gastrointestinal system: Abdomen is nondistended, soft and nontender. No organomegaly or masses felt. Normal bowel sounds heard. Central nervous system: Alert when woken up. Hearing is impaired. Extremities: Bilateral edema, left greater than right. No calf tenderness Skin: No cyanosis. No rashes Psychiatry: Could not assess Judgement and insight. Mood & affect flat.     Data Reviewed: I  have personally reviewed following labs and imaging studies  CBC:  Recent Labs Lab 03/17/16 2308 03/18/16 0337 03/19/16 0317  WBC 18.1* 15.0* 11.3*  HGB 11.0* 10.3* 9.4*  HCT 33.6* 31.5* 28.6*  MCV 94.1 94.0 96.3  PLT 230 210 [id number]   Basic Metabolic Panel:  Recent Labs Lab 03/17/16 2308 03/18/16 0337  NA 143 143  K 3.6 3.5  CL 100* 101  CO2 29 28  GLUCOSE 186* 161*  BUN 66* 67*  CREATININE 2.82* 2.80*  CALCIUM 9.0 8.5*  MG  --  2.2  PHOS  --  4.1   GFR: Estimated Creatinine Clearance:  15.7 mL/min (by C-G formula based on SCr of 2.8 mg/dL (H)). Liver Function Tests:  Recent Labs Lab 03/18/16 0337  AST 37  ALT 20  ALKPHOS 80  BILITOT 0.6  PROT 6.7  ALBUMIN 3.2*   No results for input(s): LIPASE, AMYLASE in the last 168 hours. No results for input(s): AMMONIA in the last 168 hours. Coagulation Profile:  Recent Labs Lab 03/18/16 0033  INR 1.10   Cardiac Enzymes:  Recent Labs Lab 03/18/16 0116 03/18/16 0724 03/18/16 1534  CKTOTAL 718*  --   --   TROPONINI 0.13* 0.12* 0.10*   BNP (last 3 results) No results for input(s): PROBNP in the last 8760 hours. HbA1C: No results for input(s): HGBA1C in the last 72 hours. CBG:  Recent Labs Lab 03/17/16 2331  GLUCAP 181*   Lipid Profile: No results for input(s): CHOL, HDL, LDLCALC, TRIG, CHOLHDL, LDLDIRECT in the last 72 hours. Thyroid Function Tests:  Recent Labs  03/18/16 0337  TSH 1.155   Anemia Panel: No results for input(s): VITAMINB12, FOLATE, FERRITIN, TIBC, IRON, RETICCTPCT in the last 72 hours. Sepsis Labs:  Recent Labs Lab 03/17/16 2348 03/18/16 0113 03/18/16 0116 03/18/16 0337 03/18/16 0724  PROCALCITON  --   --  20.51  --   --   LATICACIDVEN 2.39* 1.6  --  1.4 1.4    Recent Results (from the past 240 hour(s))  MRSA PCR Screening     Status: None   Collection Time: 03/18/16  3:42 AM  Result Value Ref Range Status   MRSA by PCR NEGATIVE NEGATIVE Final    Comment:        The GeneXpert MRSA Assay (FDA approved for NASAL specimens only), is one component of a comprehensive MRSA colonization surveillance program. It is not intended to diagnose MRSA infection nor to guide or monitor treatment for MRSA infections.          Radiology Studies: Dg Chest 2 View  Result Date: 03/18/2016 CLINICAL DATA:  Acute onset of progressive lethargy. Initial encounter. EXAM: CHEST  2 VIEW COMPARISON:  Chest radiograph performed 10/19/2014 FINDINGS: The lungs are well-aerated. Patchy  right-sided airspace opacification raises concern for pneumonia. There is no evidence of pleural effusion or pneumothorax. The heart is borderline enlarged. No acute osseous abnormalities are seen. IMPRESSION: 1. Patchy right-sided airspace opacification raises concern for pneumonia. 2. Borderline cardiomegaly. Electronically Signed   By: Garald Balding M.D.   On: 03/18/2016 01:16   Dg Abd 1 View  Result Date: 03/18/2016 CLINICAL DATA:  Abdominal distention.  History of colon cancer. EXAM: ABDOMEN - 1 VIEW COMPARISON:  CT abdomen pelvis 06/24/2015 FINDINGS: There is gas and stool seen within the colon. Anastomotic staple line is seen in the right lower quadrant. There is a paucity of small bowel gas. IMPRESSION: Paucity of small bowel gas without radiographic evidence of obstruction. Electronically Signed  By: Ulyses Jarred M.D.   On: 03/18/2016 05:46   Ct Head Wo Contrast  Result Date: 03/17/2016 CLINICAL DATA:  Altered mental status.  Lethargy. EXAM: CT HEAD WITHOUT CONTRAST TECHNIQUE: Contiguous axial images were obtained from the base of the skull through the vertex without intravenous contrast. COMPARISON:  06/24/2015 FINDINGS: Brain: Diffuse cerebral atrophy. Ventricular dilatation consistent with central atrophy. Low-attenuation changes in the deep white matter consistent with small vessel ischemia. No abnormal extra-axial fluid collections. No mass effect or midline shift. Gray-white matter junctions are distinct. Basal cisterns are not effaced. No acute intracranial hemorrhage. Vascular: Vascular calcifications. Skull: Normal. Negative for fracture or focal lesion. Sinuses/Orbits: Mild mucosal thickening in the paranasal sinuses with retention cyst in the right maxillary antrum. Mastoid air cells are not opacified. Other: No significant changes since prior study. IMPRESSION: No acute intracranial abnormalities. Chronic atrophy and small vessel ischemic changes. Electronically Signed   By: Lucienne Capers M.D.   On: 03/17/2016 23:57        Scheduled Meds: . aspirin EC  81 mg Oral Daily  . citalopram  20 mg Oral q morning - 10a  . enoxaparin (LOVENOX) injection  30 mg Subcutaneous Q24H  . famotidine  20 mg Oral Daily  . feeding supplement (ENSURE ENLIVE)  237 mL Oral BID BM  . guaiFENesin  600 mg Oral BID  . isosorbide mononitrate  30 mg Oral q morning - 10a  . levothyroxine  100 mcg Oral QAC breakfast  . lipase/protease/amylase  12,000 Units Oral TID WC  . OLANZapine  5 mg Oral q morning - 10a  . oseltamivir  30 mg Oral Daily  . pantoprazole  20 mg Oral Daily  . piperacillin-tazobactam (ZOSYN)  IV  2.25 g Intravenous Q6H  . predniSONE  10 mg Oral BID WC  . QUEtiapine  50 mg Oral QHS  . sodium chloride flush  3 mL Intravenous Q12H  . tamsulosin  0.4 mg Oral QHS  . [START ON 03/20/2016] vancomycin  1,000 mg Intravenous Q48H   Continuous Infusions:   LOS: 1 day     Cordelia Poche [facility] 03/19/2016, 8:04 AM Pager: [phone]  If 7PM-7AM, please contact night-coverage [email or web address] Password [email or web address] 03/19/2016, 8:04 AM

## 2016-03-19 NOTE — Progress Notes (Signed)
MD paged and made aware of pt elevated BP. RN will continue to monitor.

## 2016-03-19 NOTE — Progress Notes (Signed)
PT Cancellation Note  Patient Details Name: CATHAN GASCOIGNE MRN: [id number] DOB: [birth year]   Cancelled Treatment:    Reason Eval/Treat Not Completed: Medical issues which prohibited therapy (pt having nausea and vomiting. Will re-attempt later today or tomorrow. )   Philomena Doheny 03/19/2016, 10:17 AM

## 2016-03-19 NOTE — Progress Notes (Signed)
Per MD request, pt given water and applesauce to evaluate swallowing. Pt. Began coughing when giving both. RN will continue to monitor and maintain NPO status.

## 2016-03-19 NOTE — Progress Notes (Signed)
Morning meds not given d/t pt. Remaining NPO status and emesis episode. MD made aware. RN will continue to monitor.

## 2016-03-20 LAB — BASIC METABOLIC PANEL
ANION GAP: 12 (ref 5–15)
BUN: 55 mg/dL — ABNORMAL HIGH (ref 6–20)
CO2: 27 mmol/L (ref 22–32)
Calcium: 8 mg/dL — ABNORMAL LOW (ref 8.9–10.3)
Chloride: 112 mmol/L — ABNORMAL HIGH (ref 101–111)
Creatinine, Ser: 2.07 mg/dL — ABNORMAL HIGH (ref 0.61–1.24)
GFR calc Af Amer: 30 mL/min — ABNORMAL LOW (ref >60)
GFR, EST NON AFRICAN AMERICAN: 26 mL/min — AB (ref >60)
GLUCOSE: 215 mg/dL — AB (ref 65–99)
POTASSIUM: 3.3 mmol/L — AB (ref 3.5–5.1)
Sodium: 151 mmol/L — ABNORMAL HIGH (ref 135–145)

## 2016-03-20 LAB — CBC
HEMATOCRIT: 31.2 % — AB (ref 39.0–52.0)
HEMOGLOBIN: 10.1 g/dL — AB (ref 13.0–17.0)
MCH: 31.9 pg (ref 26.0–34.0)
MCHC: 32.4 g/dL (ref 30.0–36.0)
MCV: 98.4 fL (ref 78.0–100.0)
Platelets: 219 10*3/uL (ref 150–400)
RBC: 3.17 MIL/uL — AB (ref 4.22–5.81)
RDW: 14.4 % (ref 11.5–15.5)
WBC: 7 10*3/uL (ref 4.0–10.5)

## 2016-03-20 LAB — MAGNESIUM: Magnesium: 2.4 mg/dL (ref 1.7–2.4)

## 2016-03-20 LAB — PROCALCITONIN: Procalcitonin: 5.35 ng/mL

## 2016-03-20 MED ORDER — ORAL CARE MOUTH RINSE
15.0000 mL | Freq: Two times a day (BID) | OROMUCOSAL | Status: DC
  Administered 2016-03-20 – 2016-03-23 (×5): 15 mL via OROMUCOSAL

## 2016-03-20 MED ORDER — CHLORHEXIDINE GLUCONATE 0.12 % MT SOLN
15.0000 mL | Freq: Two times a day (BID) | OROMUCOSAL | Status: DC
  Administered 2016-03-20 – 2016-03-23 (×7): 15 mL via OROMUCOSAL
  Filled 2016-03-20 (×6): qty 15

## 2016-03-20 MED ORDER — RESOURCE THICKENUP CLEAR PO POWD
ORAL | Status: DC | PRN
  Filled 2016-03-20: qty 125

## 2016-03-20 NOTE — Evaluation (Signed)
Physical Therapy Evaluation Patient Details Name: Nathan Blackwell MRN: [id number] DOB: Jun 12, 1920 Today's Date: 03/20/2016   History of Present Illness  83 y.o. male with medical history significant of CAD, HTN, DM2,CKD,hx of DVT 0000000, GERD, diastolic CHF, OSA on C Pap. Dx of flu  Clinical Impression  Pt admitted with above diagnosis. Pt currently with functional limitations due to the deficits listed below (see PT Problem List). Pt ambulated 8' with RW, distance limited by fatigue, SaO2 99% on RA walking. Will likely need ST-SNF for post acute rehab.  Pt will benefit from skilled PT to increase their independence and safety with mobility to allow discharge to the venue listed below.       Follow Up Recommendations SNF;Supervision for mobility/OOB    Equipment Recommendations  None recommended by PT    Recommendations for Other Services       Precautions / Restrictions Precautions Precautions: Fall Precaution Comments: recent fall, cant recall exactly when Restrictions Weight Bearing Restrictions: No      Mobility  Bed Mobility Overal bed mobility: Needs Assistance Bed Mobility: Supine to Sit     Supine to sit: Mod assist     General bed mobility comments: assist to raise trunk  Transfers Overall transfer level: Needs assistance Equipment used: Rolling walker (2 wheeled) Transfers: Sit to/from Stand Sit to Stand: Min assist;From elevated surface         General transfer comment: VCs hand placement, min A to rise  Ambulation/Gait Ambulation/Gait assistance: Min guard;+2 safety/equipment Ambulation Distance (Feet): 8 Feet Assistive device: Rolling walker (2 wheeled) Gait Pattern/deviations: Step-through pattern;Decreased step length - right;Decreased step length - left;Trunk flexed   Gait velocity interpretation: at or above normal speed for age/gender General Gait Details: SaO2 99% RA with walking, distance limited by fatigue, VCs to lift head, min/guard for  safety  Stairs            Wheelchair Mobility    Modified Rankin (Stroke Patients Only)       Balance Overall balance assessment: Needs assistance;History of Falls Sitting-balance support: Feet supported;Single extremity supported Sitting balance-Leahy Scale: Fair     Standing balance support: Bilateral upper extremity supported Standing balance-Leahy Scale: Poor                               Pertinent Vitals/Pain Pain Assessment: No/denies pain    Home Living Family/patient expects to be discharged to:: Unsure Living Arrangements: Children;Spouse/significant other Available Help at Discharge: Family;Available 24 hours/day Type of Home: House Home Access: Stairs to enter   [employer] of Steps: stair lift Home Layout: Able to live on main level with bedroom/bathroom Home Equipment: Walker - 2 wheels;Walker - 4 wheels Additional Comments: lives with wife and daughter, walked with walker    Prior Function Level of Independence: Independent with assistive device(s)         Comments: ambulates wtih RW, uses rollator for going out     Hand Dominance        Extremity/Trunk Assessment   Upper Extremity Assessment Upper Extremity Assessment: Defer to OT evaluation    Lower Extremity Assessment Lower Extremity Assessment: Overall WFL for tasks assessed (knee ext AROM -10* B, strength +4/5)    Cervical / Trunk Assessment Cervical / Trunk Assessment: Kyphotic (forward head)  Communication   Communication: HOH  Cognition Arousal/Alertness: Awake/alert Behavior During Therapy: WFL for tasks assessed/performed Overall Cognitive Status: Within Functional Limits for tasks assessed  General Comments      Exercises     Assessment/Plan    PT Assessment Patient needs continued PT services  PT Problem List Decreased activity tolerance;Decreased balance;Decreased mobility          PT Treatment  Interventions Gait training;DME instruction;Functional mobility training;Therapeutic exercise;Therapeutic activities;Balance training;Patient/family education    PT Goals (Current goals can be found in the Care Plan section)  Acute Rehab PT Goals Patient Stated Goal: return to PLOF Time For Goal Achievement: 04/03/16 Potential to Achieve Goals: Good    Frequency Min 3X/week   Barriers to discharge        Co-evaluation               End of Session Equipment Utilized During Treatment: Gait belt Activity Tolerance: Patient tolerated treatment well Patient left: in chair;with call bell/phone within reach;with chair alarm set Nurse Communication: Mobility status         Time: [id number] PT Time Calculation (min) (ACUTE ONLY): 26 min   Charges:   PT Evaluation $PT Eval Moderate Complexity: 1 Procedure PT Treatments $Gait Training: 8-22 mins   PT G Codes:        Philomena Doheny 03/20/2016, 12:41 PM [phone]

## 2016-03-20 NOTE — Progress Notes (Signed)
Placed pt on Cpap full face mask with 2 lpm bled in oxygen.  Pt tolerating well at this time.

## 2016-03-20 NOTE — Progress Notes (Signed)
RN received call from telemetry reporting pt. Having 45 beat run of idioventricular rate. MD made aware. RN will continue to monitor.

## 2016-03-20 NOTE — Progress Notes (Signed)
PROGRESS NOTE    POLLARD NOU  [id number] DOB: [birth year] DOA: 03/17/2016 PCP: [name], MD   Brief Narrative: Nathan Blackwell is a 83 y.o. male with a history of CAD, hypertension, diabetes mellitus type 2, CKD, history of DVT, GERD, diastolic CHF, OSA on CPAP. Patient presented with dries weakness and found to have a flow is a infection and possible concurrent community acquired pneumonia.   Assessment & Plan:   Active Problems:   Dementia   OSA (obstructive sleep apnea)   Essential hypertension   Stage 3 chronic renal impairment associated with type 2 diabetes mellitus (HCC)   H/O Deep venous thrombosis - left femoral vein   CKD (chronic kidney disease), stage III   Dysphagia, pharyngoesophageal phase   Diastolic congestive heart failure (HCC)   Elevated troponin   Malnutrition of moderate degree (HCC)   Influenza A   NSTEMI (non-ST elevated myocardial infarction) (HCC)   Pressure injury of skin   Influenza A infection -continue Tamiflu (renally dose). First day received was 1/15 secondary to altered mental status  Acute on CKD stage 3 Baseline creatinine of about 1.6-2. 2.82 on admission and trending down. -Continue IV hydration -Encourage by mouth intake as tolerated  Community acquired pneumonia -continue vancomycin and zosyn  Acute respiratory failure with hypoxia Likely secondary to CAP and influenza.  Hypernatremia Secondary to decreased oral intake. Currently on D5 1/2NS fluids at a slow rate secondary to history of diastolic CHF -continue IV hydration and may need to switch to D5 water if continuing to worsen  Hyperkalemia -treating with potassium containing IV fluids secondary to unstable PO intake -repeat BMP -check magnesium  Essential hypertension Elevated blood pressure. -hydralazine PRN for SBP >180  Nausea vomiting Abdominal distension Likely secondary to influenza. No episodes overnight. KUB without obstruction.  Chronic diastolic  heart failure Grade 1 diastolic. Last EF of 55% seen on echo from 03/18/2016. Not in exacerbation. Stable. -daily weights -in/outs  Malnutrition of moderate severity  Bradycardia Chronic and currently asymptomatic. Repeat EKG to evaluate for heart block  OSA -continue CPAP  Leg edema LE ultrasound negative for DVT -elevate legs  Dementia -continue Zyprexa -continue Celexa  BPH -conitnue Flomax  Elevated troponin Likely secondary to demand ischemia from sepsis picture. Cardiology was initially consult did and agrees. No further intervention.   DVT prophylaxis: Lovenox  Code Status: DO NOT RESUSCITATE  Family Communication: None at bedside  Disposition Plan: Discharge pending clinical improvement. Expect transfer out of stepdown today to telemetry   Consultants:   Cardiology  Procedures:   Echocardiogram (03/19/2016)  Antimicrobials:  Ankle mycin  Zosyn  Tamiflu    Subjective: Patient reports feeling better today. No pain today.  Objective: Vitals:   03/20/16 0100 03/20/16 0200 03/20/16 0400 03/20/16 0800  BP: (!) 145/48 (!) 136/48 (!) 126/46   Pulse:      Resp: 13 12 13    Temp:    98.1 F (36.7 C)  TempSrc:    Oral  SpO2: 94% 95% 96%   Weight:      Height:        Intake/Output Summary (Last 24 hours) at 03/20/16 0941 Last data filed at 03/20/16 0902  Gross per 24 hour  Intake           1622.5 ml  Output              725 ml  Net            897.5 ml  Filed Weights   03/18/16 0400  Weight: 67.2 kg (148 lb 2.4 oz)    Examination:  General exam: Appears calm and comfortable Respiratory system: Clear to auscultation. Respiratory effort normal. Cardiovascular system: S1 & S2 heard, RRR. No murmurs, rubs, gallops or clicks. Gastrointestinal system: Abdomen is slightly distended, soft and nontender.  Normal bowel sounds heard. Central nervous system: Alert when woken up. Hearing is impaired. Extremities: Bilateral edema, left greater than  right. No calf tenderness Skin: No cyanosis. No rashes Psychiatry: Alert and oriented x3. Judgement and insight intact. Mood & affect slightly flat.    Data Reviewed: I have personally reviewed following labs and imaging studies  CBC:  Recent Labs Lab 03/17/16 2308 03/18/16 0337 03/19/16 0317 03/20/16 0803  WBC 18.1* 15.0* 11.3* 7.0  HGB 11.0* 10.3* 9.4* 10.1*  HCT 33.6* 31.5* 28.6* 31.2*  MCV 94.1 94.0 96.3 98.4  PLT 230 210 194 [id number]   Basic Metabolic Panel:  Recent Labs Lab 03/17/16 2308 03/18/16 0337 03/19/16 0317 03/20/16 0803  NA 143 143 148* 151*  K 3.6 3.5 2.9* 3.3*  CL 100* 101 109 112*  CO2 29 28 27 27   GLUCOSE 186* 161* 115* 215*  BUN 66* 67* 61* 55*  CREATININE 2.82* 2.80* 2.53* 2.07*  CALCIUM 9.0 8.5* 7.8* 8.0*  MG  --  2.2  --  2.4  PHOS  --  4.1  --   --    GFR: Estimated Creatinine Clearance: 21.2 mL/min (by C-G formula based on SCr of 2.07 mg/dL (H)). Liver Function Tests:  Recent Labs Lab 03/18/16 0337  AST 37  ALT 20  ALKPHOS 80  BILITOT 0.6  PROT 6.7  ALBUMIN 3.2*   No results for input(s): LIPASE, AMYLASE in the last 168 hours. No results for input(s): AMMONIA in the last 168 hours. Coagulation Profile:  Recent Labs Lab 03/18/16 0033  INR 1.10   Cardiac Enzymes:  Recent Labs Lab 03/18/16 0116 03/18/16 0724 03/18/16 1534  CKTOTAL 718*  --   --   TROPONINI 0.13* 0.12* 0.10*   BNP (last 3 results) No results for input(s): PROBNP in the last 8760 hours. HbA1C:  Recent Labs  03/18/16 0337  HGBA1C 7.1*   CBG:  Recent Labs Lab 03/17/16 2331  GLUCAP 181*   Lipid Profile: No results for input(s): CHOL, HDL, LDLCALC, TRIG, CHOLHDL, LDLDIRECT in the last 72 hours. Thyroid Function Tests:  Recent Labs  03/18/16 0337  TSH 1.155   Anemia Panel: No results for input(s): VITAMINB12, FOLATE, FERRITIN, TIBC, IRON, RETICCTPCT in the last 72 hours. Sepsis Labs:  Recent Labs Lab 03/17/16 2348 03/18/16 0113  03/18/16 0116 03/18/16 0337 03/18/16 0724 03/20/16 0803  PROCALCITON  --   --  20.51  --   --  5.35  LATICACIDVEN 2.39* 1.6  --  1.4 1.4  --     Recent Results (from the past 240 hour(s))  Blood culture (routine x 2)     Status: None (Preliminary result)   Collection Time: 03/18/16 12:33 AM  Result Value Ref Range Status   Specimen Description BLOOD RIGHT ANTECUBITAL  Final   Special Requests BOTTLES DRAWN AEROBIC AND ANAEROBIC 5CC EACH  Final   Culture   Final    NO GROWTH 1 DAY Performed at [facility]    Report Status PENDING  Incomplete  Blood culture (routine x 2)     Status: None (Preliminary result)   Collection Time: 03/18/16  1:11 AM  Result Value Ref Range Status  Specimen Description BLOOD LEFT ANTECUBITAL  Final   Special Requests BOTTLES DRAWN AEROBIC AND ANAEROBIC 5CC EACH  Final   Culture   Final    NO GROWTH 1 DAY Performed at [facility]    Report Status PENDING  Incomplete  MRSA PCR Screening     Status: None   Collection Time: 03/18/16  3:42 AM  Result Value Ref Range Status   MRSA by PCR NEGATIVE NEGATIVE Final    Comment:        The GeneXpert MRSA Assay (FDA approved for NASAL specimens only), is one component of a comprehensive MRSA colonization surveillance program. It is not intended to diagnose MRSA infection nor to guide or monitor treatment for MRSA infections.   Urine culture     Status: None   Collection Time: 03/18/16  4:28 AM  Result Value Ref Range Status   Specimen Description URINE, RANDOM  Final   Special Requests NONE  Final   Culture NO GROWTH Performed at [facility]   Final   Report Status 03/19/2016 FINAL  Final         Radiology Studies: Dg Abd Portable 1v  Result Date: 03/19/2016 CLINICAL DATA:  Nausea and vomiting with abdominal distention. EXAM: PORTABLE ABDOMEN - 1 VIEW COMPARISON:  03/18/2016. FINDINGS: Gaseous distention of the colon is improved. Cholecystectomy clips RIGHT upper  quadrant. Within limits for assessment on supine portable abdomen, no visible obstruction or free air. IMPRESSION: Negative. Electronically Signed   By: Staci Righter M.D.   On: 03/19/2016 13:43        Scheduled Meds: . aspirin EC  81 mg Oral Daily  . chlorhexidine  15 mL Mouth Rinse BID  . citalopram  20 mg Oral q morning - 10a  . enoxaparin (LOVENOX) injection  30 mg Subcutaneous Q24H  . famotidine  20 mg Oral Daily  . feeding supplement (ENSURE ENLIVE)  237 mL Oral BID BM  . guaiFENesin  600 mg Oral BID  . isosorbide mononitrate  30 mg Oral q morning - 10a  . levothyroxine  100 mcg Oral QAC breakfast  . lipase/protease/amylase  12,000 Units Oral TID WC  . mouth rinse  15 mL Mouth Rinse q12n4p  . OLANZapine  5 mg Oral q morning - 10a  . oseltamivir  30 mg Oral Daily  . pantoprazole  20 mg Oral Daily  . piperacillin-tazobactam (ZOSYN)  IV  2.25 g Intravenous Q6H  . predniSONE  10 mg Oral BID WC  . QUEtiapine  50 mg Oral QHS  . sodium chloride flush  3 mL Intravenous Q12H  . tamsulosin  0.4 mg Oral QHS  . vancomycin  1,000 mg Intravenous Q48H   Continuous Infusions: . dextrose 5 % and 0.45 % NaCl with KCl 40 mEq/L 75 mL/hr at 03/20/16 0243     LOS: 2 days     Cordelia Poche [facility] 03/20/2016, 9:41 AM Pager: [phone]  If 7PM-7AM, please contact night-coverage [email or web address] Password [facility] 03/20/2016, 9:41 AM

## 2016-03-20 NOTE — Progress Notes (Signed)
SLP Cancellation Note  Patient Details Name: Nathan Blackwell MRN: [id number] DOB: May 07, 1920   Cancelled treatment:       Reason Eval/Treat Not Completed: Patient at procedure or test/unavailable (with PT). Will continue efforts.  Celia B. Quentin Ore [facility], CCC-SLP [id number]  Shonna Chock 03/20/2016, 11:21 AM

## 2016-03-20 NOTE — Evaluation (Signed)
Clinical/Bedside Swallow Evaluation Patient Details  Name: GER PALADINO MRN: [id number] Date of Birth: [birth year]  Today's Date: 03/20/2016 Time: SLP Start Time (ACUTE ONLY): 53 SLP Stop Time (ACUTE ONLY): 1250 SLP Time Calculation (min) (ACUTE ONLY): 30 min  Past Medical History:  Past Medical History:  Diagnosis Date  . Anemia    NOS  . ANEMIA-NOS 06/23/2008  . ARTHRITIS 02/23/2007  . B12 deficiency 05/25/2010  . BACK PAIN, THORACIC REGION 06/04/2008  . BENIGN PROSTATIC HYPERTROPHY 10/08/2006  . BRADYCARDIA 06/10/2009  . Cancer of colon ([place]) dx'd 1998  . 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 CLOSTRIDIUM DIFFICILE COLITIS 01/30/2007  . Colitis, Clostridium difficile    presumed 11/08  . COLON CANCER, HX OF 09/20/2006  . COLONIC POLYPS, HX OF 10/08/2006  . CONSTIPATION, RECURRENT 01/06/2010  . Dementia   . DEMENTIA 05/14/2007  . DEPRESSION 02/27/2007  . Diabetes mellitus ([place])   . Diabetes mellitus type II   . DIABETES MELLITUS, TYPE II 09/20/2006  . Diastolic congestive heart failure ([place])   . DVT of lower limb, acute ([place]) 01/12/2011  . Elevated PSA   . GERD 01/14/2007  . GERD (gastroesophageal reflux disease)   . Hyperlipidemia   . HYPERLIPIDEMIA 09/20/2006  . Hypertension   . HYPERTENSION 09/20/2006  . Hypothyroidism   . HYPOTHYROIDISM 01/14/2007  . Hypothyroidism   . 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 Nephrolithiasis   . NEPHROLITHIASIS, HX OF 01/14/2007  . OSA (obstructive sleep apnea)   . Osteoporosis   . OSTEOPOROSIS 10/08/2006  . PERIPHERAL EDEMA 09/03/2007  . Prostatic hypertrophy    benign  . RENAL INSUFFICIENCY 04/01/2010  . Rotator cuff syndrome    chronic  . Sleep apnea    CPAP dependent  . SLEEP APNEA, OBSTRUCTIVE 01/14/2007  . Thyroid nodule   . THYROID NODULE 01/14/2007  . Vitamin B 12 deficiency    Past Surgical History:  Past Surgical History:  Procedure Laterality Date  . APPENDECTOMY  1998  . CATARACT EXTRACTION, BILATERAL    .  CHOLECYSTECTOMY  1999  . COLON RESECTION  1998  . ESOPHAGOGASTRODUODENOSCOPY N/A 12/30/2013   Procedure: ESOPHAGOGASTRODUODENOSCOPY (EGD);  Surgeon: Inda Castle, MD;  Location: [facility];  Service: Endoscopy;  Laterality: N/A;  . ESOPHAGOGASTRODUODENOSCOPY (EGD) WITH PROPOFOL N/A 12/19/2013   Procedure: ESOPHAGOGASTRODUODENOSCOPY (EGD) WITH PROPOFOL;  Surgeon: Inda Castle, MD;  Location: [facility];  Service: Endoscopy;  Laterality: N/A;  . inguinal herniorrhapy  1984  . NM LEXISCAN MYOVIEW LTD  09/02/2013   Normal LV function and wall motion. EF 62%; rate dependent LBBB with Lexiscan, Low Risk fixed inferior defect suggestive of diaphragmatic attenuation and not infarct with normal wall motion.  Marland Kitchen SAVORY DILATION N/A 12/19/2013   Procedure: SAVORY DILATION;  Surgeon: Inda Castle, MD;  Location: [facility]COPY;  Service: Endoscopy;  Laterality: N/A;  With Fluoroscopy  . SAVORY DILATION N/A 12/30/2013   Procedure: SAVORY DILATION;  Surgeon: Inda Castle, MD;  Location: [facility];  Service: Endoscopy;  Laterality: N/A;  . skin cancer extraction     right ear-extensive scar  . TRANSTHORACIC ECHOCARDIOGRAM  08/03/2009   Normal LV size. Mild LVH. EF 60-65%. Gr 2 DD, mild MR.   HPI:  83 year old male admitted 03/17/16 due to generalized weakness, poor po intake, cough, lethargy. PMH significant for dementia, vocal fold CA, CAD, hypertension, diabetes mellitus type 2, CKD, history of DVT, GERD, diastolic CHF, OSA on CPAP. BSE ordered to evaluate swallow function  and safety. RN reports wet voice quality after episode of vomiting.   Assessment / Plan / Recommendation Clinical Impression  Pt presents with adequate oral motor strength and function. Hoarse voice quality. No overt s/s aspiration immediately after po trials noted, however, pt exhibited a delayed congested cough several minutes after po trials. Pt has had MBS and BSE in the past, with recommendation for soft solids and  thin liquids. Given advanced age, dementia, and CXR results (right opacity), pt is at elevated risk for aspiration. Recommend completion of MBS to objectively assess swallow function and safety, and to identify least restrictive diet. Will continue with conservative diet of Dys2/nectar thick liquids until MBS can be completed.     Aspiration Risk  Moderate aspiration risk    Diet Recommendation Dysphagia 2 (Fine chop);Nectar-thick liquid   Liquid Administration via: Cup;Straw Medication Administration: Whole meds with liquid Supervision: Staff to assist with self feeding;Full supervision/cueing for compensatory strategies Compensations: Minimize environmental distractions;Slow rate;Small sips/bites Postural Changes: Seated upright at 90 degrees;Remain upright for at least 30 minutes after po intake    Other  Recommendations Oral Care Recommendations: Oral care QID Other Recommendations: Have oral suction available   Follow up Recommendations 24 hour supervision/assistance      Frequency and Duration  (pending MBS results)          Prognosis Prognosis for Safe Diet Advancement: Fair Barriers to Reach Goals: Cognitive deficits      Swallow Study   General Date of Onset: 03/17/16 HPI: 83 year old male admitted 03/17/16 due to generalized weakness, poor po intake, cough, lethargy. PMH significant for dementia, vocal fold CA, CAD, hypertension, diabetes mellitus type 2, CKD, history of DVT, GERD, diastolic CHF, OSA on CPAP. BSE ordered to evaluate swallow function and safety. RN reports wet voice quality after episode of vomiting. Type of Study: Bedside Swallow Evaluation Previous Swallow Assessment: MBS 11/2013 - primary esophageal dysphagia, esophageal web. BSE 08/2014 -  D3/thin Diet Prior to this Study: Regular;Thin liquids Temperature Spikes Noted: Yes (102.9 03/18/16) Respiratory Status: Room air History of Recent Intubation: No Behavior/Cognition: Alert;Cooperative;Requires  cueing;Distractible Oral Cavity Assessment: Within Functional Limits Oral Care Completed by SLP: Yes Oral Cavity - Dentition: Adequate natural dentition;Dentures, top Vision: Functional for self-feeding Self-Feeding Abilities: Able to feed self Patient Positioning: Upright in chair Baseline Vocal Quality: Hoarse Volitional Cough: Cognitively unable to elicit Volitional Swallow: Unable to elicit    Oral/Motor/Sensory Function Overall Oral Motor/Sensory Function: Within functional limits   Ice Chips Ice chips: Within functional limits Presentation: Spoon   Thin Liquid Thin Liquid: Impaired Presentation: Straw;Cup Pharyngeal  Phase Impairments: Suspected delayed Swallow;Cough - Delayed    Nectar Thick Nectar Thick Liquid: Not tested   Honey Thick Honey Thick Liquid: Not tested   Puree Puree: Impaired Presentation: Spoon Pharyngeal Phase Impairments: Suspected delayed Swallow;Cough - Delayed   Solid   GO   Solid: Not tested       Iola Turri B. Quentin Ore [facility], [place] [phone]  Shonna Chock 03/20/2016,1:45 PM

## 2016-03-21 ENCOUNTER — Inpatient Hospital Stay (HOSPITAL_COMMUNITY): Payer: Medicare Other

## 2016-03-21 LAB — BASIC METABOLIC PANEL
Anion gap: 12 (ref 5–15)
BUN: 40 mg/dL — AB (ref 6–20)
CO2: 23 mmol/L (ref 22–32)
CREATININE: 1.71 mg/dL — AB (ref 0.61–1.24)
Calcium: 8.4 mg/dL — ABNORMAL LOW (ref 8.9–10.3)
Chloride: 113 mmol/L — ABNORMAL HIGH (ref 101–111)
GFR calc Af Amer: 38 mL/min — ABNORMAL LOW (ref >60)
GFR calc non Af Amer: 33 mL/min — ABNORMAL LOW (ref >60)
GLUCOSE: 223 mg/dL — AB (ref 65–99)
Potassium: 3.8 mmol/L (ref 3.5–5.1)
SODIUM: 148 mmol/L — AB (ref 135–145)

## 2016-03-21 MED ORDER — PIPERACILLIN-TAZOBACTAM 3.375 G IVPB
3.3750 g | Freq: Three times a day (TID) | INTRAVENOUS | Status: DC
  Administered 2016-03-21 – 2016-03-22 (×2): 3.375 g via INTRAVENOUS
  Filled 2016-03-21 (×2): qty 50

## 2016-03-21 NOTE — NC FL2 (Signed)
[place] LEVEL OF CARE SCREENING TOOL     IDENTIFICATION  Patient Name: Nathan Blackwell Birthdate: 02/13/1921 Sex: male Admission Date (Current Location): 03/17/2016  [facility] and [place] Number:  Herbalist and Address:  [facility],  [address], [place]      Provider Number: [id number]  Attending Physician Name and Address:  Mariel Aloe, MD  Relative Name and Phone Number:       Current Level of Care: Hospital Recommended Level of Care: [place] Prior Approval Number:    Date Approved/Denied:   PASRR Number:   [id number]  Discharge Plan: SNF    Current Diagnoses: Patient Active Problem List   Diagnosis Date Noted  . Influenza A 03/18/2016  . NSTEMI (non-ST elevated myocardial infarction) ([place]) 03/18/2016  . Pressure injury of skin 03/18/2016  . Degenerative arthritis of left knee 02/17/2016  . Left knee pain 02/05/2016  . Right ankle pain 01/18/2016  . Skin lesion of right arm 01/18/2016  . Lesion of ear 01/18/2016  . Abnormal stress test 01/18/2016  . Chest pain 01/10/2016  . Right leg pain 12/23/2015  . Thyroid activity decreased   . Malnutrition of moderate degree ([place]) 10/20/2014  . Hypertensive urgency 10/19/2014  . Elevated troponin 10/19/2014  . Gait disorder 09/26/2014  . Pneumonia, viral   . Hypokalemia 08/18/2014  . Diastolic congestive heart failure ([place])   . Weakness 08/17/2014  . Nausea vomiting and diarrhea 08/17/2014  . Multifactorial gait disorder 06/24/2014  . Dysphagia, pharyngoesophageal phase 12/18/2013  . Esophageal web 12/18/2013  . Thrush 10/30/2013  . Ringworm 10/30/2013  . Cough 08/25/2013  . Exertional chest pain 08/22/2013  . Nausea and vomiting 05/25/2013  . Unspecified constipation 05/02/2013  . Shuffling gait 12/17/2012  . Recurrent falls 09/05/2012  . Orthostatic hypotension 08/06/2012  . Epigastric abdominal tenderness 07/14/2012  . Dysphagia,  unspecified(787.20) 07/14/2012  . Pain with swallowing 06/28/2011  . Preventative health care 02/12/2011  . Back pain 01/23/2011  . Anticoagulation monitoring, INR range 2-3 01/08/2011  . CKD (chronic kidney disease), stage III 01/08/2011  . H/O Deep venous thrombosis - left femoral vein 01/06/2011  . B12 deficiency 05/25/2010  . Stage 3 chronic renal impairment associated with type 2 diabetes mellitus ([place]) 04/01/2010  . CONSTIPATION, RECURRENT 01/06/2010  . HEMATOCHEZIA 01/06/2010  . BRADYCARDIA 06/10/2009  . FATIGUE 05/18/2009  . HIP PAIN, RIGHT 04/01/2009  . TINNITUS 10/13/2008  . ANEMIA-NOS 06/23/2008  . Loss of weight 06/23/2008  . Other B-complex deficiencies 06/04/2008  . PARESTHESIA 06/04/2008  . PERIPHERAL EDEMA 09/03/2007  . OLECRANON BURSITIS, RIGHT 05/24/2007  . BURSITIS, RIGHT KNEE 05/24/2007  . Dementia 05/14/2007  . Depression 02/27/2007  . ARTHRITIS 02/23/2007  . DIZZINESS 01/30/2007  . THYROID NODULE 01/14/2007  . Hypothyroidism 01/14/2007  . OSA (obstructive sleep apnea) 01/14/2007  . GERD 01/14/2007  . ROTATOR CUFF SYNDROME, LEFT 01/14/2007  . PSA, INCREASED 01/14/2007  . NEPHROLITHIASIS, HX OF 01/14/2007  . BENIGN PROSTATIC HYPERTROPHY 10/08/2006  . OSTEOPOROSIS 10/08/2006  . COLONIC POLYPS, HX OF 10/08/2006  . 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 Diabetes ([place]) 09/20/2006  . Hyperlipidemia 09/20/2006  . Essential hypertension 09/20/2006  . COLON CANCER, HX OF 09/20/2006    Orientation RESPIRATION BLADDER Height & Weight     Situation, Self  Normal Incontinent Weight: 156 lb 4.9 oz (70.9 kg) Height:  5\' 10"  (177.8 cm)  BEHAVIORAL SYMPTOMS/MOOD NEUROLOGICAL BOWEL NUTRITION STATUS  Continent Diet (DYS 2)  AMBULATORY STATUS COMMUNICATION OF NEEDS Skin   Limited Assist Verbally Other (Comment) (Stage 1 pressure ulcer-buttocks, Stage 2 pressure injury-Shoulder, unstageable pressure injust-leg)                       Personal Care  Assistance Level of Assistance  Bathing, Dressing, Feeding Bathing Assistance: Limited assistance Feeding assistance: Independent Dressing Assistance: Limited assistance     Functional Limitations [place]  PT (By licensed PT), OT (By licensed OT)     PT Frequency: 5 OT Frequency: 5            Contractures      Additional Factors Info  Code Status Code Status Info: DNR Allergies Info: BIAXIN CLARITHROMYCIN, OTHER, SULFA ANTIBIOTICS            Current Medications (03/21/2016):  This is the current hospital active medication list Current Facility-Administered Medications  Medication Dose Route Frequency Provider Last Rate Last Dose  . acetaminophen (TYLENOL) tablet 650 mg  650 mg Oral Q6H PRN Toy Baker, MD   650 mg at 03/18/16 2319   Or  . acetaminophen (TYLENOL) suppository 650 mg  650 mg Rectal Q6H PRN Toy Baker, MD      . albuterol (PROVENTIL) (2.5 MG/3ML) 0.083% nebulizer solution 2.5 mg  2.5 mg Nebulization Q2H PRN Toy Baker, MD      . aspirin EC tablet 81 mg  81 mg Oral Daily Toy Baker, MD   Stopped at 03/18/16 1000  . bisacodyl (DULCOLAX) suppository 10 mg  10 mg Rectal Daily PRN Toy Baker, MD      . chlorhexidine (PERIDEX) 0.12 % solution 15 mL  15 mL Mouth Rinse BID Mariel Aloe, MD   15 mL at 03/21/16 1058  . citalopram (CELEXA) tablet 20 mg  20 mg Oral q morning - 10a Toy Baker, MD   20 mg at 03/21/16 1058  . dextrose 5 % and 0.45 % NaCl with KCl 40 mEq/L infusion   Intravenous Continuous Mariel Aloe, MD   Stopped at 03/21/16 1800  . enoxaparin (LOVENOX) injection 30 mg  30 mg Subcutaneous Q24H Mariel Aloe, MD   30 mg at 03/21/16 1058  . famotidine (PEPCID) tablet 20 mg  20 mg Oral Daily Toy Baker, MD   20 mg at 03/21/16 1058  . feeding supplement (ENSURE ENLIVE) (ENSURE ENLIVE) liquid 237 mL  237 mL Oral BID BM Toy Baker, MD   237 mL at  03/21/16 1511  . guaiFENesin (MUCINEX) 12 hr tablet 600 mg  600 mg Oral BID Toy Baker, MD   600 mg at 03/18/16 2133  . hydrALAZINE (APRESOLINE) injection 5 mg  5 mg Intravenous Q6H PRN Mariel Aloe, MD   5 mg at 03/21/16 0529  . isosorbide mononitrate (IMDUR) 24 hr tablet 30 mg  30 mg Oral q morning - 10a Toy Baker, MD   Stopped at 03/18/16 1000  . levothyroxine (SYNTHROID, LEVOTHROID) tablet 100 mcg  100 mcg Oral QAC breakfast Toy Baker, MD   100 mcg at 03/21/16 0758  . lipase/protease/amylase (CREON) capsule 12,000 Units  12,000 Units Oral TID WC Toy Baker, MD   12,000 Units at 03/21/16 1505  . MEDLINE mouth rinse  15 mL Mouth Rinse q12n4p Mariel Aloe, MD   15 mL at 03/21/16 1506  . OLANZapine (ZYPREXA) tablet 5 mg  5 mg Oral q morning - 10a Toy Baker, MD   5 mg at 03/21/16 1058  . ondansetron (ZOFRAN) tablet 4 mg  4 mg Oral Q6H PRN Toy Baker, MD       Or  . ondansetron (ZOFRAN) injection 4 mg  4 mg Intravenous Q6H PRN Toy Baker, MD   4 mg at 03/19/16 1011  . oseltamivir (TAMIFLU) capsule 30 mg  30 mg Oral Daily Dorrene German, RPH   30 mg at 03/21/16 1058  . pantoprazole (PROTONIX) EC tablet 20 mg  20 mg Oral Daily Toy Baker, MD   20 mg at 03/21/16 1058  . piperacillin-tazobactam (ZOSYN) IVPB 3.375 g  3.375 g Intravenous Q8H Drew A Wofford, RPH      . polyethylene glycol (MIRALAX / GLYCOLAX) packet 17 g  17 g Oral Daily PRN Toy Baker, MD      . predniSONE (DELTASONE) tablet 10 mg  10 mg Oral BID WC Toy Baker, MD   10 mg at 03/21/16 0758  . QUEtiapine (SEROQUEL) tablet 50 mg  50 mg Oral QHS Toy Baker, MD   50 mg at 03/20/16 2104  . [place]   Oral PRN Mariel Aloe, MD      . sodium chloride flush (NS) 0.9 % injection 3 mL  3 mL Intravenous Q12H Toy Baker, MD   3 mL at 03/19/16 0956  . tamsulosin (FLOMAX) capsule 0.4 mg  0.4 mg Oral QHS Toy Baker, MD   0.4  mg at 03/20/16 2103  . tiZANidine (ZANAFLEX) tablet 4 mg  4 mg Oral Q6H PRN Toy Baker, MD      . traMADol (ULTRAM) tablet 50 mg  50 mg Oral Q8H PRN Toy Baker, MD         Discharge Medications: Please see discharge summary for a list of discharge medications.  Relevant Imaging Results:  Relevant Lab Results:   Additional Information SS#: [id number]  Weston Anna, LCSW

## 2016-03-21 NOTE — Progress Notes (Signed)
Pharmacy Antibiotic Note  Nathan Blackwell is a 83 y.o. male c/o progressive lethargy admitted on 03/17/2016 with sepsis.  Pharmacy has been consulted for zosyn/vancomycin dosing; today is D4 of therapy.  Plan:  Adjust Zosyn to 3.375 g IV q8 hrs by extended infusion  Consider narrowing to Rocephin or, if tolerating orals, to Augmentin (provides aspiration coverage) or Ceftin to complete CAP tx  Tamiflu dose appropriate for renal function   Height: 5\' 10"  (177.8 cm) Weight: 148 lb 2.4 oz (67.2 kg) IBW/kg (Calculated) : 73  Temp (24hrs), Avg:97.9 F (36.6 C), Min:97.7 F (36.5 C), Max:98.1 F (36.7 C)   Recent Labs Lab 03/17/16 2308 03/17/16 2348 03/18/16 0113 03/18/16 0337 03/18/16 0724 03/19/16 0317 03/20/16 0803 03/21/16 0906  WBC 18.1*  --   --  15.0*  --  11.3* 7.0  --   CREATININE 2.82*  --   --  2.80*  --  2.53* 2.07* 1.71*  LATICACIDVEN  --  2.39* 1.6 1.4 1.4  --   --   --     Estimated Creatinine Clearance: 25.7 mL/min (by C-G formula based on SCr of 1.71 mg/dL (H)).    Allergies  Allergen Reactions  . Biaxin [Clarithromycin] Other (See Comments)    Unknown   . Other Other (See Comments)    Nephrologist has told patient not to eat many different foods (tomatoes, [employer], etc).   . Sulfa Antibiotics Other (See Comments)    Unknown     Antimicrobials this admission:  1/12 rocephin >> x1 Ed 1/13 zosyn >>  1/13 vancomycin >> 1/15 1/13 tamiflu >> rx adjusted for renal function  Dose adjustments this admission:   Microbiology results:  1/13 BCx: NGTD 1/13 UCx: NG-F 1/13 MRSA PCR: neg 1/13 strep pneumo ur ag: neg 1/12 flu: positive A  Thank you for allowing pharmacy to be a part of this patient's care.  Reuel Boom, PharmD, BCPS Pager: [phone] 03/21/2016, 2:51 PM

## 2016-03-21 NOTE — Progress Notes (Signed)
Modified Barium Swallow Progress Note  Patient Details  Name: Nathan Blackwell MRN: [id number] Date of Birth: 1920/10/01  Today's Date: 03/21/2016  Modified Barium Swallow completed.  Full report located under Chart Review in the Imaging Section.  Brief recommendations include the following:  Clinical Impression  Pt presents with a mild oropharyngeal dysphagia marked by prolonged oral preparation with decreased lingual manipulation; swallow delay that triggers at level of pyriforms for all liquids.  There is consistent penetration to the vocal folds with consumption of thin and nectar-thick liquids due to anatomical curvature of cervical spine. Larynx/upper trachea are oriented in nearly horizontal position - this leads to consistent spilling of liquids into the larynx during the swallow.  Upon completion of swallow (descent of larynx), liquids are generally ejected from the larynx and there is no aspiration.  Trace barium noted at anterior commissure of vocal cords.  Pt could not follow instructions for compensatory strategies.  Given no difference in function for thin and nectar-thick liquids, recommend pt remain on thins; continue dysphagia 2 diet.  Given anatomical presentation, with acute medical decline or MS changes, pt will be at a high aspiration risk.  Recommend SLP f/u for safety and family education.     Swallow Evaluation Recommendations       SLP Diet Recommendations: Dysphagia 2 (Fine chop) solids;Thin liquid   Liquid Administration via: Cup   Medication Administration: Crushed with puree   Supervision: Full assist for feeding   Compensations: Minimize environmental distractions;Slow rate;Small sips/bites   Postural Changes: Remain semi-upright after after feeds/meals (Comment)            Juan Quam Laurice 03/21/2016,2:41 PM

## 2016-03-21 NOTE — Progress Notes (Signed)
PROGRESS NOTE    Nathan Blackwell  [id number] DOB: April 09, 1920 DOA: 03/17/2016 PCP: [name], MD   Brief Narrative: Nathan Blackwell is a 83 y.o. male with a history of CAD, hypertension, diabetes mellitus type 2, CKD, history of DVT, GERD, diastolic CHF, OSA on CPAP. Patient presented with dries weakness and found to have a flow is a infection and possible concurrent community acquired pneumonia.   Assessment & Plan:   Active Problems:   Dementia   OSA (obstructive sleep apnea)   Essential hypertension   Stage 3 chronic renal impairment associated with type 2 diabetes mellitus (HCC)   H/O Deep venous thrombosis - left femoral vein   CKD (chronic kidney disease), stage III   Dysphagia, pharyngoesophageal phase   Diastolic congestive heart failure (HCC)   Elevated troponin   Malnutrition of moderate degree (HCC)   Influenza A   NSTEMI (non-ST elevated myocardial infarction) (HCC)   Pressure injury of skin   Influenza A infection -continue Tamiflu (renally dose)  Acute on CKD stage 3 Baseline creatinine of about 1.6-2. 2.82 on admission and trending down. -Continue IV hydration -Encourage by mouth intake as tolerated  Community acquired pneumonia Bacterial vs influenza. Sputum culture still not back -Discontinue vancomycin -Continue zosyn -sputum culture -Plan to transition to oral medication in AM  Acute respiratory failure with hypoxia Likely secondary to CAP and influenza.  Hypernatremia Secondary to decreased oral intake. Currently on D5 1/2NS fluids at a slow rate secondary to history of diastolic CHF -continue IV hydration and may need to switch to D5 water if continuing to worsen -await BMP for today  Hypokalemia Awaiting BMP for today. -discontinue potassium containing fluids if normal -if still low, supplement by mouth  Essential hypertension Elevated blood pressure. -hydralazine PRN for SBP >180  Nausea vomiting Abdominal distension Likely  secondary to influenza. No episodes overnight. KUB without obstruction.  Chronic diastolic heart failure Grade 1 diastolic. Last EF of 55% seen on echo from 03/18/2016. Not in exacerbation. Stable. -daily weights -in/outs  Malnutrition of moderate severity  Bradycardia Chronic and currently asymptomatic. Repeat EKG to evaluate for heart block  OSA -continue CPAP  Leg edema LE ultrasound negative for DVT -elevate legs  Dementia -continue Zyprexa -continue Celexa  BPH -conitnue Flomax  Elevated troponin Likely secondary to demand ischemia from sepsis picture. Cardiology was initially consult did and agrees. No further intervention.   DVT prophylaxis: Lovenox  Code Status: DO NOT RESUSCITATE  Family Communication: None at bedside (called family but no response and have not called back) Disposition Plan: Discharge to SNF in 1-2 days   Consultants:   Cardiology  Procedures:   Echocardiogram (03/19/2016)  Antimicrobials:  Ankle mycin  Zosyn  Tamiflu    Subjective: Patient reports feeling better today. No pain today.  Objective: Vitals:   03/20/16 1225 03/20/16 1530 03/20/16 2036 03/21/16 0529  BP:  (!) 155/66 (!) 172/61 (!) 186/63  Pulse:  (!) 53 (!) 50 (!) 47  Resp:  20 20 20   Temp:  97.9 F (36.6 C) 97.7 F (36.5 C) 98.1 F (36.7 C)  TempSrc:  Oral Oral Oral  SpO2: 99% 94% 94% 93%  Weight:      Height:        Intake/Output Summary (Last 24 hours) at 03/21/16 0744 Last data filed at 03/21/16 0554  Gross per 24 hour  Intake             1397 ml  Output  1285 ml  Net              112 ml   Filed Weights   03/18/16 0400  Weight: 67.2 kg (148 lb 2.4 oz)    Examination:  General exam: Appears calm and comfortable Respiratory system: Diminished breath sounds to auscultation. Respiratory effort normal. Cardiovascular system: S1 & S2 heard, RRR. No murmurs, rubs, gallops or clicks. Gastrointestinal system: Abdomen is slightly  distended, soft and nontender.  Normal bowel sounds heard. Central nervous system: Alert when woken up. Hearing is impaired. Extremities: Bilateral edema. No calf tenderness Skin: No cyanosis. No rashes Psychiatry: Alert and oriented x3. Judgement and insight intact. Mood & affect slightly flat.    Data Reviewed: I have personally reviewed following labs and imaging studies  CBC:  Recent Labs Lab 03/17/16 2308 03/18/16 0337 03/19/16 0317 03/20/16 0803  WBC 18.1* 15.0* 11.3* 7.0  HGB 11.0* 10.3* 9.4* 10.1*  HCT 33.6* 31.5* 28.6* 31.2*  MCV 94.1 94.0 96.3 98.4  PLT 230 210 194 [id number]   Basic Metabolic Panel:  Recent Labs Lab 03/17/16 2308 03/18/16 0337 03/19/16 0317 03/20/16 0803  NA 143 143 148* 151*  K 3.6 3.5 2.9* 3.3*  CL 100* 101 109 112*  CO2 29 28 27 27   GLUCOSE 186* 161* 115* 215*  BUN 66* 67* 61* 55*  CREATININE 2.82* 2.80* 2.53* 2.07*  CALCIUM 9.0 8.5* 7.8* 8.0*  MG  --  2.2  --  2.4  PHOS  --  4.1  --   --    GFR: Estimated Creatinine Clearance: 21.2 mL/min (by C-G formula based on SCr of 2.07 mg/dL (H)). Liver Function Tests:  Recent Labs Lab 03/18/16 0337  AST 37  ALT 20  ALKPHOS 80  BILITOT 0.6  PROT 6.7  ALBUMIN 3.2*   No results for input(s): LIPASE, AMYLASE in the last 168 hours. No results for input(s): AMMONIA in the last 168 hours. Coagulation Profile:  Recent Labs Lab 03/18/16 0033  INR 1.10   Cardiac Enzymes:  Recent Labs Lab 03/18/16 0116 03/18/16 0724 03/18/16 1534  CKTOTAL 718*  --   --   TROPONINI 0.13* 0.12* 0.10*   BNP (last 3 results) No results for input(s): PROBNP in the last 8760 hours. HbA1C: No results for input(s): HGBA1C in the last 72 hours. CBG:  Recent Labs Lab 03/17/16 2331  GLUCAP 181*   Lipid Profile: No results for input(s): CHOL, HDL, LDLCALC, TRIG, CHOLHDL, LDLDIRECT in the last 72 hours. Thyroid Function Tests: No results for input(s): TSH, T4TOTAL, FREET4, T3FREE, THYROIDAB in the last  72 hours. Anemia Panel: No results for input(s): VITAMINB12, FOLATE, FERRITIN, TIBC, IRON, RETICCTPCT in the last 72 hours. Sepsis Labs:  Recent Labs Lab 03/17/16 2348 03/18/16 0113 03/18/16 0116 03/18/16 0337 03/18/16 0724 03/20/16 0803  PROCALCITON  --   --  20.51  --   --  5.35  LATICACIDVEN 2.39* 1.6  --  1.4 1.4  --     Recent Results (from the past 240 hour(s))  Blood culture (routine x 2)     Status: None (Preliminary result)   Collection Time: 03/18/16 12:33 AM  Result Value Ref Range Status   Specimen Description BLOOD RIGHT ANTECUBITAL  Final   Special Requests BOTTLES DRAWN AEROBIC AND ANAEROBIC 5CC EACH  Final   Culture   Final    NO GROWTH 2 DAYS Performed at [facility]    Report Status PENDING  Incomplete  Blood culture (routine x 2)  Status: None (Preliminary result)   Collection Time: 03/18/16  1:11 AM  Result Value Ref Range Status   Specimen Description BLOOD LEFT ANTECUBITAL  Final   Special Requests BOTTLES DRAWN AEROBIC AND ANAEROBIC 5CC EACH  Final   Culture   Final    NO GROWTH 2 DAYS Performed at [facility]    Report Status PENDING  Incomplete  MRSA PCR Screening     Status: None   Collection Time: 03/18/16  3:42 AM  Result Value Ref Range Status   MRSA by PCR NEGATIVE NEGATIVE Final    Comment:        The GeneXpert MRSA Assay (FDA approved for NASAL specimens only), is one component of a comprehensive MRSA colonization surveillance program. It is not intended to diagnose MRSA infection nor to guide or monitor treatment for MRSA infections.   Urine culture     Status: None   Collection Time: 03/18/16  4:28 AM  Result Value Ref Range Status   Specimen Description URINE, RANDOM  Final   Special Requests NONE  Final   Culture NO GROWTH Performed at [facility]   Final   Report Status 03/19/2016 FINAL  Final         Radiology Studies: Dg Abd Portable 1v  Result Date: 03/19/2016 CLINICAL DATA:   Nausea and vomiting with abdominal distention. EXAM: PORTABLE ABDOMEN - 1 VIEW COMPARISON:  03/18/2016. FINDINGS: Gaseous distention of the colon is improved. Cholecystectomy clips RIGHT upper quadrant. Within limits for assessment on supine portable abdomen, no visible obstruction or free air. IMPRESSION: Negative. Electronically Signed   By: Staci Righter M.D.   On: 03/19/2016 13:43        Scheduled Meds: . aspirin EC  81 mg Oral Daily  . chlorhexidine  15 mL Mouth Rinse BID  . citalopram  20 mg Oral q morning - 10a  . enoxaparin (LOVENOX) injection  30 mg Subcutaneous Q24H  . famotidine  20 mg Oral Daily  . feeding supplement (ENSURE ENLIVE)  237 mL Oral BID BM  . guaiFENesin  600 mg Oral BID  . isosorbide mononitrate  30 mg Oral q morning - 10a  . levothyroxine  100 mcg Oral QAC breakfast  . lipase/protease/amylase  12,000 Units Oral TID WC  . mouth rinse  15 mL Mouth Rinse q12n4p  . OLANZapine  5 mg Oral q morning - 10a  . oseltamivir  30 mg Oral Daily  . pantoprazole  20 mg Oral Daily  . piperacillin-tazobactam (ZOSYN)  IV  2.25 g Intravenous Q6H  . predniSONE  10 mg Oral BID WC  . QUEtiapine  50 mg Oral QHS  . sodium chloride flush  3 mL Intravenous Q12H  . tamsulosin  0.4 mg Oral QHS   Continuous Infusions: . dextrose 5 % and 0.45 % NaCl with KCl 40 mEq/L 75 mL/hr at 03/21/16 0537     LOS: 3 days     Cordelia Poche [facility] 03/21/2016, 7:44 AM Pager: [phone]  If 7PM-7AM, please contact night-coverage [email or web address] Password [email or web address] 03/21/2016, 7:44 AM

## 2016-03-21 NOTE — Progress Notes (Signed)
OT Cancellation Note  Patient Details Name: Nathan Blackwell MRN: [id number] DOB: 09-Feb-1921   Cancelled Treatment:    Reason Eval/Treat Not Completed: Patient at procedure or test/ unavailable  Will check on pt later in day or next day Kari Baars, [place]  [facility], Thereasa Parkin 03/21/2016, 1:25 PM

## 2016-03-22 DIAGNOSIS — F0390 Unspecified dementia without behavioral disturbance: Secondary | ICD-10-CM

## 2016-03-22 DIAGNOSIS — I10 Essential (primary) hypertension: Secondary | ICD-10-CM

## 2016-03-22 DIAGNOSIS — I5032 Chronic diastolic (congestive) heart failure: Secondary | ICD-10-CM

## 2016-03-22 LAB — CBC WITH DIFFERENTIAL/PLATELET
BASOS ABS: 0 10*3/uL (ref 0.0–0.1)
Basophils Relative: 0 %
Eosinophils Absolute: 0 10*3/uL (ref 0.0–0.7)
Eosinophils Relative: 0 %
HEMATOCRIT: 36.3 % — AB (ref 39.0–52.0)
HEMOGLOBIN: 12.5 g/dL — AB (ref 13.0–17.0)
LYMPHS PCT: 10 %
Lymphs Abs: 1 10*3/uL (ref 0.7–4.0)
MCH: 32 pg (ref 26.0–34.0)
MCHC: 34.4 g/dL (ref 30.0–36.0)
MCV: 92.8 fL (ref 78.0–100.0)
MONO ABS: 0.6 10*3/uL (ref 0.1–1.0)
MONOS PCT: 6 %
NEUTROS ABS: 8.2 10*3/uL — AB (ref 1.7–7.7)
NEUTROS PCT: 84 %
Platelets: 227 10*3/uL (ref 150–400)
RBC: 3.91 MIL/uL — ABNORMAL LOW (ref 4.22–5.81)
RDW: 13.6 % (ref 11.5–15.5)
WBC: 9.9 10*3/uL (ref 4.0–10.5)

## 2016-03-22 LAB — BASIC METABOLIC PANEL
ANION GAP: 11 (ref 5–15)
BUN: 32 mg/dL — ABNORMAL HIGH (ref 6–20)
CHLORIDE: 112 mmol/L — AB (ref 101–111)
CO2: 23 mmol/L (ref 22–32)
Calcium: 8.9 mg/dL (ref 8.9–10.3)
Creatinine, Ser: 1.5 mg/dL — ABNORMAL HIGH (ref 0.61–1.24)
GFR calc non Af Amer: 38 mL/min — ABNORMAL LOW (ref >60)
GFR, EST AFRICAN AMERICAN: 44 mL/min — AB (ref >60)
GLUCOSE: 254 mg/dL — AB (ref 65–99)
Potassium: 4.1 mmol/L (ref 3.5–5.1)
Sodium: 146 mmol/L — ABNORMAL HIGH (ref 135–145)

## 2016-03-22 LAB — PROCALCITONIN: Procalcitonin: 1.24 ng/mL

## 2016-03-22 MED ORDER — AMOXICILLIN-POT CLAVULANATE 500-125 MG PO TABS
500.0000 mg | ORAL_TABLET | Freq: Two times a day (BID) | ORAL | Status: DC
  Administered 2016-03-22 – 2016-03-23 (×3): 500 mg via ORAL
  Filled 2016-03-22 (×4): qty 1

## 2016-03-22 MED ORDER — DEXTROSE 5 % IV SOLN
INTRAVENOUS | Status: DC
  Administered 2016-03-22: 17:00:00 via INTRAVENOUS

## 2016-03-22 MED ORDER — OSELTAMIVIR PHOSPHATE 30 MG PO CAPS
30.0000 mg | ORAL_CAPSULE | Freq: Every day | ORAL | Status: DC
  Filled 2016-03-22: qty 1

## 2016-03-22 NOTE — Progress Notes (Signed)
Physical Therapy Treatment Patient Details Name: Nathan Blackwell MRN: [id number] DOB: 1920/12/02 Today's Date: 03/22/2016    History of Present Illness 83 y.o. male with medical history significant of CAD, HTN, DM2,CKD,hx of DVT 0000000, GERD, diastolic CHF, OSA on C Pap. Dx of flu    PT Comments    The patient was  Attempting to exit his recliner. Assisted back into recliner. Assisted to [facility] and short shuffling ambulation x 10' with 2 assist. Continue PT.  Follow Up Recommendations  SNF;Supervision for mobility/OOB     Equipment Recommendations  None recommended by PT    Recommendations for Other Services       Precautions / Restrictions Precautions Precautions: Fall Precaution Comments: recent fall, cant recall exactly when Restrictions Weight Bearing Restrictions: No    Mobility  Bed Mobility           Sit to supine: Supervision      Transfers   Equipment used: Rolling walker (2 wheeled)   Sit to Stand: Min assist;From elevated surface         General transfer comment: VCs hand placement, min A to rise  Ambulation/Gait Ambulation/Gait assistance: Min guard;+2 safety/equipment Ambulation Distance (Feet): 10 Feet Assistive device: Rolling walker (2 wheeled) Gait Pattern/deviations: Step-to pattern;Step-through pattern Gait velocity: slow   General Gait Details: much assistance with the RW for turns, multimodal cues for backing to recliner.   Stairs            Wheelchair Mobility    Modified Rankin (Stroke Patients Only)       Balance Overall balance assessment: Needs assistance;History of Falls   Sitting balance-Leahy Scale: Fair     Standing balance support: Bilateral upper extremity supported Standing balance-Leahy Scale: Poor                      Cognition Arousal/Alertness: Awake/alert Behavior During Therapy: WFL for tasks assessed/performed Overall Cognitive Status: No family/caregiver present to determine baseline  cognitive functioning                 General Comments: did not talk much.  Pt was attempting to get out of chair when we arrived. Cued for safety:  follows one step commands. Patient stated" I guess they won't go to schoole today"    Exercises      General Comments        Pertinent Vitals/Pain Pain Assessment: Faces Faces Pain Scale: No hurt    Home Living Family/patient expects to be discharged to:: Unsure Living Arrangements: Children;Spouse/significant other             Additional Comments: lives with wife and daughter, walked with walker    Prior Function Level of Independence: Independent with assistive device(s)      Comments: ambulates wtih RW, uses rollator for going out   PT Goals (current goals can now be found in the care plan section) Acute Rehab PT Goals Patient Stated Goal: return to PLOF Progress towards PT goals: Progressing toward goals    Frequency           PT Plan Current plan remains appropriate    Co-evaluation PT/OT/SLP Co-Evaluation/Treatment: Yes Reason for Co-Treatment: For patient/therapist safety PT goals addressed during session: Mobility/safety with mobility OT goals addressed during session: ADL's and self-care     End of Session   Activity Tolerance: Patient tolerated treatment well Patient left: in bed;with call bell/phone within reach;with bed alarm set     Time: [id number] PT Time  Calculation (min) (ACUTE ONLY): 24 min  Charges:  $Gait Training: 8-22 mins                    G Codes:      Claretha Cooper 03/22/2016, 1:26 PM Tresa Endo PT [phone]

## 2016-03-22 NOTE — Progress Notes (Signed)
Speech Language Pathology Treatment: Dysphagia  Patient Details Name: Nathan Blackwell MRN: [id number] DOB: [birth year] Today's Date: 03/22/2016 Time: [id number] SLP Time Calculation (min) (ACUTE ONLY): 20 min  Assessment / Plan / Recommendation Clinical Impression  Pt was seen during lunch. Pt required assistance/set up prior to self feeding (cutting spaghetti). Pt able to self feed, and was noted to take small bites at slow rate. Intermittent cough noted throughout meal. Safe swallow precautions were posted at [facility], specifically noting NO STRAWS, 1:1 SUPERVISION, HIGH ASPIRATION RISK. Given advanced dementia and inability to follow verbal commands, pt appropriateness and safety for po intake is likely to be highly variable, and po should be provided ONLY when pt is awake and alert. No family present to complete education at this time. ST will continue to follow acutely for assessment of diet tolerance and education with staff/family.    HPI HPI: 83 year old male admitted 03/17/16 due to generalized weakness, poor po intake, cough, lethargy. PMH significant for dementia, vocal fold CA, CAD, hypertension, diabetes mellitus type 2, CKD, history of DVT, GERD, diastolic CHF, OSA on CPAP. BSE ordered to evaluate swallow function and safety. RN reports wet voice quality after episode of vomiting. MBS completed for more definitive swallow dx.. Recommendation for dys 2/thin liquid, 1:1 supervision during meals      SLP Plan  Continue with current plan of care     Recommendations  Diet recommendations: Dysphagia 2 (fine chop);Thin liquid Liquids provided via: No straw;Cup Medication Administration: Crushed with puree Supervision: Full supervision/cueing for compensatory strategies;Staff to assist with self feeding Compensations: Minimize environmental distractions;Slow rate;Small sips/bites Postural Changes and/or Swallow Maneuvers: Seated upright 90 degrees;Upright 30-60 min after meal         Oral  Care Recommendations: Oral care QID Follow up Recommendations: 24 hour supervision/assistance Plan: Continue with current plan of care       [facility], CCC-SLP [id number]  Shonna Chock 03/22/2016, 11:33 AM

## 2016-03-22 NOTE — Evaluation (Signed)
Occupational Therapy Evaluation Patient Details Name: Nathan Blackwell MRN: [id number] DOB: 1920/07/25 Today's Date: 03/22/2016    History of Present Illness 83 y.o. male with medical history significant of CAD, HTN, DM2,CKD,hx of DVT 0000000, GERD, diastolic CHF, OSA on C Pap. Dx of flu   Clinical Impression   Pt was admitted for the above. He will benefit from continued OT to increase safety and independence with adls.  Goals are for min guard to min A in acute setting.  He currently needs min +2 assistance for transfers    Follow Up Recommendations  SNF;Supervision/Assistance - 24 hour    Equipment Recommendations  None recommended by OT    Recommendations for Other Services       Precautions / Restrictions Precautions Precautions: Fall Precaution Comments: recent fall, cant recall exactly when Restrictions Weight Bearing Restrictions: No      Mobility Bed Mobility           Sit to supine: Supervision      Transfers   Equipment used: Rolling walker (2 wheeled)   Sit to Stand: Min assist;From elevated surface         General transfer comment: VCs hand placement, min A to rise    Balance     Sitting balance-Leahy Scale: Fair       Standing balance-Leahy Scale: Poor                              ADL Overall ADL's : Needs assistance/impaired     Grooming: Wash/dry hands;Set up;Sitting                   Toilet Transfer: Minimal assistance;BSC;RW;+2 for safety/equipment   Toileting- Clothing Manipulation and Hygiene: Total assistance;+2 for safety/equipment;Sit to/from stand         General ADL Comments: pt was attempting to get out of the chair.  Had small amount of feces when he got up, but he did not have BM when on 3:1.  Fatiques quickly. sats 93% on RA.  Based on clinical judgment, pt needs max A for LB adls     Vision     Perception     Praxis      Pertinent Vitals/Pain Pain Assessment: Faces Faces Pain Scale:  No hurt     Hand Dominance     Extremity/Trunk Assessment Upper Extremity Assessment Upper Extremity Assessment: Generalized weakness           Communication Communication Communication: HOH   Cognition Arousal/Alertness: Awake/alert Behavior During Therapy: WFL for tasks assessed/performed Overall Cognitive Status: No family/caregiver present to determine baseline cognitive functioning                 General Comments: did not talk much.  Pt was attempting to get out of chair when we arrived. Cued for safety:  follows one step commands   General Comments       Exercises       Shoulder Instructions      Home Living Family/patient expects to be discharged to:: Unsure Living Arrangements: Children;Spouse/significant other                               Additional Comments: lives with wife and daughter, walked with walker      Prior Functioning/Environment Level of Independence: Independent with assistive device(s)        Comments: ambulates  wtih RW, uses rollator for going out        OT Problem List: Decreased strength;Decreased activity tolerance;Impaired balance (sitting and/or standing);Decreased knowledge of use of DME or AE;Decreased safety awareness   OT Treatment/Interventions: Self-care/ADL training;DME and/or AE instruction;Patient/family education;Balance training;Therapeutic activities    OT Goals(Current goals can be found in the care plan section) Acute Rehab OT Goals Patient Stated Goal: return to PLOF OT Goal Formulation: With patient Time For Goal Achievement: 03/29/16 Potential to Achieve Goals: Good ADL Goals Pt Will Perform Grooming: with min guard assist;standing Pt Will Transfer to Toilet: with min guard assist;ambulating;bedside commode Pt Will Perform Toileting - Clothing Manipulation and hygiene: with min guard assist;sit to/from stand Additional ADL Goal #1: pt will complete  LB ADL with min A, sit to stand  OT  Frequency: Min 2X/week   Barriers to D/C:            Co-evaluation PT/OT/SLP Co-Evaluation/Treatment: Yes Reason for Co-Treatment: For patient/therapist safety PT goals addressed during session: Mobility/safety with mobility OT goals addressed during session: ADL's and self-care      End of Session    Activity Tolerance: Patient limited by fatigue Patient left: in bed;with call bell/phone within reach;with bed alarm set   Time: [id number] OT Time Calculation (min): 23 min Charges:  OT General Charges $OT Visit: 1 Procedure OT Evaluation $OT Eval Moderate Complexity: 1 Procedure G-Codes:    Tericka Devincenzi April 17, 2016, 12:12 PM Lesle Chris, OTR/L [phone] 17-Apr-2016

## 2016-03-22 NOTE — Progress Notes (Addendum)
Inpatient Diabetes Program Recommendations  AACE/ADA: New Consensus Statement on Inpatient Glycemic Control (2015)  Target Ranges:  Prepandial:   less than 140 mg/dL      Peak postprandial:   less than 180 mg/dL (1-2 hours)      Critically ill patients:  140 - 180 mg/dL   Results for Nathan Blackwell, Nathan Blackwell (MRN [id number]) as of 03/22/2016 10:32  Ref. Range 03/20/2016 08:03 03/21/2016 09:06  Glucose Latest Ref Range: 65 - 99 mg/dL 215 (H) 223 (H)    Admit with: Sepsis/ Influenza  History: DM2  Home DM Meds: Januvia 50 mg daily  Current Insulin Orders: None     MD- Patient with History of DM per H&P notes.  No orders for CBG checks at present.  Please consider placing orders for Novolog Sensitive Correction Scale/ SSI (0-9 units) TID AC + HS     --Will follow patient during hospitalization--  Wyn Quaker RN, MSN, CDE Diabetes Coordinator Inpatient Glycemic Control Team Team Pager: [phone] (8a-5p)

## 2016-03-22 NOTE — Progress Notes (Signed)
Patient family chose SNF bed at [facility]. LCSWA arranged for patient daughter to completed paperwork by e-mail. Per physician patient is not ready for discharge today, possibly 1/17.

## 2016-03-22 NOTE — Progress Notes (Signed)
PROGRESS NOTE    [name]  [id number] DOB: 1920-12-10 DOA: 03/17/2016 PCP: [name], MD   Brief Narrative: Nathan Blackwell is a 83 y.o. male with a history of CAD, hypertension, diabetes mellitus type 2, CKD, history of DVT, GERD, diastolic CHF, OSA on CPAP. Patient presented with dries weakness and found to have a flow is a infection and possible concurrent community acquired pneumonia.   Assessment & Plan:   Active Problems:   Dementia   OSA (obstructive sleep apnea)   Essential hypertension   Stage 3 chronic renal impairment associated with type 2 diabetes mellitus (HCC)   H/O Deep venous thrombosis - left femoral vein   CKD (chronic kidney disease), stage III   Dysphagia, pharyngoesophageal phase   Diastolic congestive heart failure (HCC)   Elevated troponin   Malnutrition of moderate degree (HCC)   Influenza A   NSTEMI (non-ST elevated myocardial infarction) (HCC)   Pressure injury of skin   Influenza A infection -continue Tamiflu (renally dose) to complete the course.   Acute on CKD stage 3 Baseline creatinine of about 1.6-2. 2.82 on admission and trending down. -Continue IV hydration for another 24 hours.  -Encourage by mouth intake as tolerated  Community acquired pneumonia; received IV vancomycin initially and zosyn, completed IV zosyn till 1/16, change to augmentin to complete the course.    Acute respiratory failure with hypoxia Likely secondary to CAP and influenza. Improving. Wean him off oxygen as able.   Hypernatremia Secondary to decreased oral intake. Currently on D5 1/2NS fluids at a slow rate secondary to history of diastolic CHF -change to dextrose 5% fluids today. Improving.   Hypokalemia Repleted.   Essential hypertension Well controlled resume imdur and prn hydralazine.   Nausea vomiting Abdominal distension Likely secondary to influenza. No episodes overnight. KUB without obstruction.  Chronic diastolic heart failure Grade  1 diastolic. Last EF of 55% seen on echo from 03/18/2016. Not in exacerbation. Stable. -daily weights Appears to be compensated.   Malnutrition of moderate severity  Bradycardia Chronic and currently asymptomatic  OSA -continue CPAP  Leg edema LE ultrasound negative for DVT -elevate legs  Dementia -continue Zyprexa -continue Celexa  BPH -conitnue Flomax  Elevated troponin Likely secondary to demand ischemia from sepsis picture. Cardiology was initially consult did and agrees. No further intervention.   DVT prophylaxis: Lovenox  Code Status: DO NOT RESUSCITATE  Family Communication: None at bedside (called family but no response and have not called back) Disposition Plan: Discharge to SNF in 1-2 days   Consultants:   Cardiology  Procedures:   Echocardiogram (03/19/2016)  Antimicrobials:  Vancomycin.   Zosyn  Tamiflu    Subjective: No new complaints.   Objective: Vitals:   03/21/16 1500 03/21/16 1828 03/21/16 2109 03/22/16 0547  BP: 108/84  (!) 197/56 (!) 170/88  Pulse: (!) 56  65 71  Resp: 20  18 20   Temp: 97.3 F (36.3 C)  97.4 F (36.3 C) 98 F (36.7 C)  TempSrc: Oral  Oral Oral  SpO2: 94%  93% 91%  Weight:  70.9 kg (156 lb 4.9 oz)    Height:        Intake/Output Summary (Last 24 hours) at 03/22/16 1250 Last data filed at 03/22/16 0857  Gross per 24 hour  Intake          1800.75 ml  Output             1350 ml  Net  450.75 ml   Filed Weights   03/18/16 0400 03/21/16 1828  Weight: 67.2 kg (148 lb 2.4 oz) 70.9 kg (156 lb 4.9 oz)    Examination:  General exam: Appears calm and comfortable Respiratory system: Diminished breath sounds to auscultation. Respiratory effort normal. No wheezing or rhonchi.  Cardiovascular system: S1 & S2 heard, RRR. No murmurs, rubs, gallops or clicks. Gastrointestinal system: Abdomen is slightly distended, soft and nontender.  Normal bowel sounds heard. Central nervous system: Alert and  comfortable. Marland Kitchen Hearing is impaired. Extremities: Bilateral edema. No calf tenderness Skin: No cyanosis. No rashes     Data Reviewed: I have personally reviewed following labs and imaging studies  CBC:  Recent Labs Lab 03/17/16 2308 03/18/16 0337 03/19/16 0317 03/20/16 0803 03/22/16 1128  WBC 18.1* 15.0* 11.3* 7.0 9.9  NEUTROABS  --   --   --   --  8.2*  HGB 11.0* 10.3* 9.4* 10.1* 12.5*  HCT 33.6* 31.5* 28.6* 31.2* 36.3*  MCV 94.1 94.0 96.3 98.4 92.8  PLT 230 210 194 219 [id number]   Basic Metabolic Panel:  Recent Labs Lab 03/18/16 0337 03/19/16 0317 03/20/16 0803 03/21/16 0906 03/22/16 1128  NA 143 148* 151* 148* 146*  K 3.5 2.9* 3.3* 3.8 4.1  CL 101 109 112* 113* 112*  CO2 28 27 27 23 23   GLUCOSE 161* 115* 215* 223* 254*  BUN 67* 61* 55* 40* 32*  CREATININE 2.80* 2.53* 2.07* 1.71* 1.50*  CALCIUM 8.5* 7.8* 8.0* 8.4* 8.9  MG 2.2  --  2.4  --   --   PHOS 4.1  --   --   --   --    GFR: Estimated Creatinine Clearance: 30.9 mL/min (by C-G formula based on SCr of 1.5 mg/dL (H)). Liver Function Tests:  Recent Labs Lab 03/18/16 0337  AST 37  ALT 20  ALKPHOS 80  BILITOT 0.6  PROT 6.7  ALBUMIN 3.2*   No results for input(s): LIPASE, AMYLASE in the last 168 hours. No results for input(s): AMMONIA in the last 168 hours. Coagulation Profile:  Recent Labs Lab 03/18/16 0033  INR 1.10   Cardiac Enzymes:  Recent Labs Lab 03/18/16 0116 03/18/16 0724 03/18/16 1534  CKTOTAL 718*  --   --   TROPONINI 0.13* 0.12* 0.10*   BNP (last 3 results) No results for input(s): PROBNP in the last 8760 hours. HbA1C: No results for input(s): HGBA1C in the last 72 hours. CBG:  Recent Labs Lab 03/17/16 2331  GLUCAP 181*   Lipid Profile: No results for input(s): CHOL, HDL, LDLCALC, TRIG, CHOLHDL, LDLDIRECT in the last 72 hours. Thyroid Function Tests: No results for input(s): TSH, T4TOTAL, FREET4, T3FREE, THYROIDAB in the last 72 hours. Anemia Panel: No results for  input(s): VITAMINB12, FOLATE, FERRITIN, TIBC, IRON, RETICCTPCT in the last 72 hours. Sepsis Labs:  Recent Labs Lab 03/17/16 2348 03/18/16 0113 03/18/16 0116 03/18/16 0337 03/18/16 0724 03/20/16 0803 03/22/16 0640  PROCALCITON  --   --  20.51  --   --  5.35 1.24  LATICACIDVEN 2.39* 1.6  --  1.4 1.4  --   --     Recent Results (from the past 240 hour(s))  Blood culture (routine x 2)     Status: None (Preliminary result)   Collection Time: 03/18/16 12:33 AM  Result Value Ref Range Status   Specimen Description BLOOD RIGHT ANTECUBITAL  Final   Special Requests BOTTLES DRAWN AEROBIC AND ANAEROBIC [facility]  Final   Culture   Final  NO GROWTH 3 DAYS Performed at [facility], [place] [address], [place], [place] [address]    Report Status PENDING  Incomplete  Blood culture (routine x 2)     Status: None (Preliminary result)   Collection Time: 03/18/16  1:11 AM  Result Value Ref Range Status   Specimen Description BLOOD LEFT ANTECUBITAL  Final   Special Requests BOTTLES DRAWN AEROBIC AND ANAEROBIC 5CC EACH  Final   Culture   Final    NO GROWTH 3 DAYS Performed at [facility], [place] [address], [place], [place] [address]    Report Status PENDING  Incomplete  MRSA PCR Screening     Status: None   Collection Time: 03/18/16  3:42 AM  Result Value Ref Range Status   MRSA by PCR NEGATIVE NEGATIVE Final    Comment:        The GeneXpert MRSA Assay (FDA approved for NASAL specimens only), is one component of a comprehensive MRSA colonization surveillance program. It is not intended to diagnose MRSA infection nor to guide or monitor treatment for MRSA infections.   Urine culture     Status: None   Collection Time: 03/18/16  4:28 AM  Result Value Ref Range Status   Specimen Description URINE, RANDOM  Final   Special Requests NONE  Final   Culture NO GROWTH Performed at [facility]   Final   Report Status 03/19/2016 FINAL  Final         Radiology  Studies: No results found.      Scheduled Meds: . amoxicillin-clavulanate  500 mg Oral BID  . aspirin EC  81 mg Oral Daily  . chlorhexidine  15 mL Mouth Rinse BID  . citalopram  20 mg Oral q morning - 10a  . enoxaparin (LOVENOX) injection  30 mg Subcutaneous Q24H  . famotidine  20 mg Oral Daily  . feeding supplement (ENSURE ENLIVE)  237 mL Oral BID BM  . guaiFENesin  600 mg Oral BID  . isosorbide mononitrate  30 mg Oral q morning - 10a  . levothyroxine  100 mcg Oral QAC breakfast  . lipase/protease/amylase  12,000 Units Oral TID WC  . mouth rinse  15 mL Mouth Rinse q12n4p  . OLANZapine  5 mg Oral q morning - 10a  . oseltamivir  30 mg Oral Daily  . pantoprazole  20 mg Oral Daily  . predniSONE  10 mg Oral BID WC  . QUEtiapine  50 mg Oral QHS  . sodium chloride flush  3 mL Intravenous Q12H  . tamsulosin  0.4 mg Oral QHS   Continuous Infusions: . dextrose 5 % and 0.45 % NaCl with KCl 40 mEq/L Stopped (03/21/16 1800)     LOS: 4 days     Ura Yingling [facility] 03/22/2016, 12:50 PM Pager: [phone]   If 7PM-7AM, please contact night-coverage [email or web address] Password [email or web address] 03/22/2016, 12:50 PM

## 2016-03-23 ENCOUNTER — Other Ambulatory Visit: Payer: Self-pay | Admitting: *Deleted

## 2016-03-23 DIAGNOSIS — J189 Pneumonia, unspecified organism: Secondary | ICD-10-CM | POA: Diagnosis not present

## 2016-03-23 DIAGNOSIS — E871 Hypo-osmolality and hyponatremia: Secondary | ICD-10-CM | POA: Diagnosis not present

## 2016-03-23 DIAGNOSIS — Z6823 Body mass index (BMI) 23.0-23.9, adult: Secondary | ICD-10-CM | POA: Diagnosis not present

## 2016-03-23 DIAGNOSIS — D649 Anemia, unspecified: Secondary | ICD-10-CM | POA: Diagnosis present

## 2016-03-23 DIAGNOSIS — R278 Other lack of coordination: Secondary | ICD-10-CM | POA: Diagnosis not present

## 2016-03-23 DIAGNOSIS — J1108 Influenza due to unidentified influenza virus with specified pneumonia: Secondary | ICD-10-CM | POA: Diagnosis not present

## 2016-03-23 DIAGNOSIS — Z9049 Acquired absence of other specified parts of digestive tract: Secondary | ICD-10-CM | POA: Diagnosis not present

## 2016-03-23 DIAGNOSIS — R1314 Dysphagia, pharyngoesophageal phase: Secondary | ICD-10-CM

## 2016-03-23 DIAGNOSIS — Z823 Family history of stroke: Secondary | ICD-10-CM | POA: Diagnosis not present

## 2016-03-23 DIAGNOSIS — F0390 Unspecified dementia without behavioral disturbance: Secondary | ICD-10-CM | POA: Diagnosis not present

## 2016-03-23 DIAGNOSIS — E1122 Type 2 diabetes mellitus with diabetic chronic kidney disease: Secondary | ICD-10-CM | POA: Diagnosis not present

## 2016-03-23 DIAGNOSIS — R066 Hiccough: Secondary | ICD-10-CM | POA: Diagnosis not present

## 2016-03-23 DIAGNOSIS — E876 Hypokalemia: Secondary | ICD-10-CM | POA: Diagnosis present

## 2016-03-23 DIAGNOSIS — Z7982 Long term (current) use of aspirin: Secondary | ICD-10-CM | POA: Diagnosis not present

## 2016-03-23 DIAGNOSIS — R509 Fever, unspecified: Secondary | ICD-10-CM | POA: Diagnosis not present

## 2016-03-23 DIAGNOSIS — Z85038 Personal history of other malignant neoplasm of large intestine: Secondary | ICD-10-CM | POA: Diagnosis not present

## 2016-03-23 DIAGNOSIS — I130 Hypertensive heart and chronic kidney disease with heart failure and stage 1 through stage 4 chronic kidney disease, or unspecified chronic kidney disease: Secondary | ICD-10-CM | POA: Diagnosis present

## 2016-03-23 DIAGNOSIS — E1140 Type 2 diabetes mellitus with diabetic neuropathy, unspecified: Secondary | ICD-10-CM | POA: Diagnosis not present

## 2016-03-23 DIAGNOSIS — H8110 Benign paroxysmal vertigo, unspecified ear: Secondary | ICD-10-CM | POA: Diagnosis not present

## 2016-03-23 DIAGNOSIS — E870 Hyperosmolality and hypernatremia: Secondary | ICD-10-CM | POA: Diagnosis not present

## 2016-03-23 DIAGNOSIS — R5381 Other malaise: Secondary | ICD-10-CM | POA: Diagnosis not present

## 2016-03-23 DIAGNOSIS — E860 Dehydration: Secondary | ICD-10-CM | POA: Diagnosis present

## 2016-03-23 DIAGNOSIS — E039 Hypothyroidism, unspecified: Secondary | ICD-10-CM | POA: Diagnosis not present

## 2016-03-23 DIAGNOSIS — G4733 Obstructive sleep apnea (adult) (pediatric): Secondary | ICD-10-CM | POA: Diagnosis present

## 2016-03-23 DIAGNOSIS — I5032 Chronic diastolic (congestive) heart failure: Secondary | ICD-10-CM | POA: Diagnosis not present

## 2016-03-23 DIAGNOSIS — M129 Arthropathy, unspecified: Secondary | ICD-10-CM | POA: Diagnosis not present

## 2016-03-23 DIAGNOSIS — J101 Influenza due to other identified influenza virus with other respiratory manifestations: Secondary | ICD-10-CM | POA: Diagnosis not present

## 2016-03-23 DIAGNOSIS — F0391 Unspecified dementia with behavioral disturbance: Secondary | ICD-10-CM | POA: Diagnosis not present

## 2016-03-23 DIAGNOSIS — R079 Chest pain, unspecified: Secondary | ICD-10-CM | POA: Diagnosis not present

## 2016-03-23 DIAGNOSIS — R030 Elevated blood-pressure reading, without diagnosis of hypertension: Secondary | ICD-10-CM | POA: Diagnosis not present

## 2016-03-23 DIAGNOSIS — R531 Weakness: Secondary | ICD-10-CM | POA: Diagnosis not present

## 2016-03-23 DIAGNOSIS — K5901 Slow transit constipation: Secondary | ICD-10-CM | POA: Diagnosis not present

## 2016-03-23 DIAGNOSIS — E440 Moderate protein-calorie malnutrition: Secondary | ICD-10-CM | POA: Diagnosis present

## 2016-03-23 DIAGNOSIS — N179 Acute kidney failure, unspecified: Secondary | ICD-10-CM | POA: Diagnosis not present

## 2016-03-23 DIAGNOSIS — M6281 Muscle weakness (generalized): Secondary | ICD-10-CM | POA: Diagnosis not present

## 2016-03-23 DIAGNOSIS — J9601 Acute respiratory failure with hypoxia: Secondary | ICD-10-CM | POA: Diagnosis not present

## 2016-03-23 DIAGNOSIS — Z79899 Other long term (current) drug therapy: Secondary | ICD-10-CM | POA: Diagnosis not present

## 2016-03-23 DIAGNOSIS — N400 Enlarged prostate without lower urinary tract symptoms: Secondary | ICD-10-CM | POA: Diagnosis not present

## 2016-03-23 DIAGNOSIS — M069 Rheumatoid arthritis, unspecified: Secondary | ICD-10-CM | POA: Diagnosis not present

## 2016-03-23 DIAGNOSIS — I4581 Long QT syndrome: Secondary | ICD-10-CM | POA: Diagnosis present

## 2016-03-23 DIAGNOSIS — Z66 Do not resuscitate: Secondary | ICD-10-CM | POA: Diagnosis present

## 2016-03-23 DIAGNOSIS — Z8521 Personal history of malignant neoplasm of larynx: Secondary | ICD-10-CM | POA: Diagnosis not present

## 2016-03-23 DIAGNOSIS — Z833 Family history of diabetes mellitus: Secondary | ICD-10-CM | POA: Diagnosis not present

## 2016-03-23 DIAGNOSIS — N183 Chronic kidney disease, stage 3 (moderate): Secondary | ICD-10-CM | POA: Diagnosis not present

## 2016-03-23 DIAGNOSIS — R2689 Other abnormalities of gait and mobility: Secondary | ICD-10-CM | POA: Diagnosis not present

## 2016-03-23 DIAGNOSIS — L89210 Pressure ulcer of right hip, unstageable: Secondary | ICD-10-CM | POA: Diagnosis not present

## 2016-03-23 DIAGNOSIS — I10 Essential (primary) hypertension: Secondary | ICD-10-CM | POA: Diagnosis not present

## 2016-03-23 DIAGNOSIS — I2510 Atherosclerotic heart disease of native coronary artery without angina pectoris: Secondary | ICD-10-CM | POA: Diagnosis not present

## 2016-03-23 LAB — BASIC METABOLIC PANEL
Anion gap: 11 (ref 5–15)
BUN: 29 mg/dL — AB (ref 6–20)
CO2: 22 mmol/L (ref 22–32)
CREATININE: 1.24 mg/dL (ref 0.61–1.24)
Calcium: 8.6 mg/dL — ABNORMAL LOW (ref 8.9–10.3)
Chloride: 109 mmol/L (ref 101–111)
GFR calc Af Amer: 56 mL/min — ABNORMAL LOW (ref >60)
GFR, EST NON AFRICAN AMERICAN: 48 mL/min — AB (ref >60)
Glucose, Bld: 260 mg/dL — ABNORMAL HIGH (ref 65–99)
POTASSIUM: 4.3 mmol/L (ref 3.5–5.1)
SODIUM: 142 mmol/L (ref 135–145)

## 2016-03-23 LAB — CULTURE, BLOOD (ROUTINE X 2)
CULTURE: NO GROWTH
CULTURE: NO GROWTH

## 2016-03-23 MED ORDER — GUAIFENESIN ER 600 MG PO TB12: 600.0000 mg | ORAL_TABLET | Freq: Two times a day (BID) | ORAL | Status: DC

## 2016-03-23 MED ORDER — OSELTAMIVIR PHOSPHATE 30 MG PO CAPS
30.0000 mg | ORAL_CAPSULE | Freq: Two times a day (BID) | ORAL | Status: DC
  Administered 2016-03-23: 30 mg via ORAL
  Filled 2016-03-23 (×2): qty 1

## 2016-03-23 MED ORDER — TRAMADOL HCL 50 MG PO TABS: 50.0000 mg | ORAL_TABLET | Freq: Three times a day (TID) | ORAL | 0 refills | Status: DC | PRN

## 2016-03-23 MED ORDER — ALBUTEROL SULFATE (2.5 MG/3ML) 0.083% IN NEBU: 2.5000 mg | INHALATION_SOLUTION | RESPIRATORY_TRACT | 12 refills | Status: AC | PRN

## 2016-03-23 MED ORDER — AMOXICILLIN-POT CLAVULANATE 500-125 MG PO TABS: 500.0000 mg | ORAL_TABLET | Freq: Two times a day (BID) | ORAL | 0 refills | Status: DC

## 2016-03-23 MED ORDER — RESOURCE THICKENUP CLEAR PO POWD: ORAL | Status: DC

## 2016-03-23 MED ORDER — FUROSEMIDE 80 MG PO TABS: 40.0000 mg | ORAL_TABLET | Freq: Two times a day (BID) | ORAL | 0 refills | Status: DC

## 2016-03-23 MED ORDER — OSELTAMIVIR PHOSPHATE 30 MG PO CAPS: 30.0000 mg | ORAL_CAPSULE | Freq: Two times a day (BID) | ORAL | 0 refills | Status: DC

## 2016-03-23 NOTE — Care Management Note (Signed)
Case Management Note  Patient Details  Name: Nathan Blackwell MRN: [id number] Date of Birth: 07/06/1920  Subjective/Objective:                    Action/Plan:d/c SNF.   Expected Discharge Date:  03/23/16               Expected Discharge Plan:  Skilled Nursing Facility  In-House Referral:  Clinical Social Work  Discharge planning Services     Post Acute Care Choice:    Choice offered to:     DME Arranged:    DME Agency:     HH Arranged:    [place] Agency:     Status of Service:     If discussed at [employer], dates discussed:    Additional Comments:  Dessa Phi, RN 03/23/2016, 1:31 PM

## 2016-03-23 NOTE — Progress Notes (Signed)
Speech Language Pathology Treatment: Dysphagia  Patient Details Name: Nathan Blackwell MRN: [id number] DOB: 06/01/1920 Today's Date: 03/23/2016 Time: 1035-1045 SLP Time Calculation (min) (ACUTE ONLY): 10 min  Assessment / Plan / Recommendation Clinical Impression  Pt demonstrates appropriate arousal for PO. SLp repositioned pt and completed oral care, also removing upper partial, cleaning and replacing. Pt was able to self feed, but sips do result in immediate coughing. Given results of MBS, best method is precautions and oral care as different texture do no improve risk. Discussed with daughter and encouraged educating future caregivers regarding positioning and oral care. Daughter understands the risk of aspiration and is agreeable to this plan of care. No further acute SLP intervention needed as education complete. Will sign off.    HPI HPI: 83 year old male admitted 03/17/16 due to generalized weakness, poor po intake, cough, lethargy. PMH significant for dementia, vocal fold CA, CAD, hypertension, diabetes mellitus type 2, CKD, history of DVT, GERD, diastolic CHF, OSA on CPAP. BSE ordered to evaluate swallow function and safety. RN reports wet voice quality after episode of vomiting. MBS completed for more definitive swallow dx.. Recommendation for dys 2/thin liquid, 1:1 supervision during meals      SLP Plan  All goals met     Recommendations  Diet recommendations: Dysphagia 2 (fine chop);Thin liquid Liquids provided via: No straw;Cup Medication Administration: Crushed with puree Supervision: Full supervision/cueing for compensatory strategies;Staff to assist with self feeding Compensations: Minimize environmental distractions;Slow rate;Small sips/bites Postural Changes and/or Swallow Maneuvers: Seated upright 90 degrees;Upright 30-60 min after meal                Oral Care Recommendations: Oral care QID Follow up Recommendations: Skilled Nursing facility Plan: All goals  met       GO                , MA CCC-SLP [phone]  ,  [place] 03/23/2016, 11:13 AM    

## 2016-03-23 NOTE — Progress Notes (Signed)
PHARMACY NOTE:  ANTIMICROBIAL RENAL DOSAGE ADJUSTMENT  Current antimicrobial regimen includes a mismatch between antimicrobial dosage and estimated renal function.  As per policy approved by the Pharmacy & Therapeutics and Medical Executive Committees, the antimicrobial dosage will be adjusted accordingly.  Current antimicrobial dosage:  Oseltamivir 30 mg daily (will finish 5-day course the evening of 1/19)  Indication: Influenza A infetion  Renal Function:   Estimated Creatinine Clearance: 36.3 mL/min (by C-G formula based on SCr of 1.24 mg/dL). []      On intermittent HD, scheduled: []      On CRRT    Antimicrobial dosage has been changed to:  Oseltamivr 30 mg PO BID today and tomorrow to complete 5-day course   Thank you for allowing pharmacy to be a part of this patient's care.  Clayburn Pert, PharmD, BCPS Pager: [phone] 03/23/2016  10:47 AM

## 2016-03-23 NOTE — Discharge Summary (Addendum)
Physician Discharge Summary  Nathan Blackwell [id number] DOB: 10/25/1920 DOA: 03/17/2016  PCP: [name], MD  Admit date: 03/17/2016 Discharge date: 03/23/2016  Admitted From: SNF Disposition:  SNF  Recommendations for Outpatient Follow-up:  1. Follow up with PCP in 1-2 weeks 2. Please obtain BMP/CBC in one week     Discharge Condition:stable CODE STATUS:DNR Diet recommendation:  Dysphagia 2 diet  Brief/Interim Summary: Nathan Blackwell is a 83 y.o. male with a history of CAD, hypertension, diabetes mellitus type 2, CKD, history of DVT, GERD, diastolic CHF, OSA on CPAP. Patient presented with dries weakness and found to have a flow is a infection and possible concurrent community acquired pneumonia.  Discharge Diagnoses:  Active Problems:   Dementia   OSA (obstructive sleep apnea)   Essential hypertension   Stage 3 chronic renal impairment associated with type 2 diabetes mellitus (HCC)   H/O Deep venous thrombosis - left femoral vein   CKD (chronic kidney disease), stage III   Dysphagia, pharyngoesophageal phase   Diastolic congestive heart failure (HCC)   Elevated troponin   Malnutrition of moderate degree (HCC)   Influenza A   Pressure injury of skin   Influenza A infection -continue Tamiflu (renally dose) to complete the course.   Acute on CKD stage 3 Baseline creatinine of about 1.6-2. 2.82 on admission and trending down. -Continue IV hydration for another 24 hours.  -Encourage by mouth intake as tolerated  Community acquired pneumonia; received IV vancomycin initially and zosyn, completed IV zosyn till 1/16, changed to augmentin to complete the course.    Acute respiratory failure with hypoxia Likely secondary to CAP and influenza. Improving. Weaned him off oxygen as able.   Hypernatremia Secondary to decreased oral intake. Currently on D5 1/2NS fluids at a slow rate secondary to history of diastolic CHF -change to dextrose 5% fluids. Much improved.    Hypokalemia Repleted.   Essential hypertension Well controlled resume imdur and prn hydralazine.   Nausea vomiting Abdominal distension Likely secondary to influenza. No episodes overnight. KUB without obstruction.  Chronic diastolic heart failure Grade 1 diastolic. Last EF of 55% seen on echo from 03/18/2016. Not in exacerbation. Stable. -daily weights Appears to be compensated.   Malnutrition of moderate severity  Bradycardia Chronic and currently asymptomatic  OSA -continue CPAP  Leg edema LE ultrasound negative for DVT -elevate legs  Dementia -continue Zyprexa -continue Celexa  BPH -conitnue Flomax  Elevated troponin Likely secondary to demand ischemia from sepsis picture. Cardiology was initially consult did and agrees. No further intervention.NSTEMI ruled out.  Discharge Instructions  Discharge Instructions    Discharge instructions    Complete by:  As directed    Please follow up with PCP in one week.  We have decreased your lasix from 80 mg bid to 40 mg BID starting from next week, please see PCP prior to starting the new reduced dose of lasix.     Allergies as of 03/23/2016      Reactions   Biaxin [clarithromycin] Other (See Comments)   Unknown   Other Other (See Comments)   Nephrologist has told patient not to eat many different foods (tomatoes, [employer], etc).    Sulfa Antibiotics Other (See Comments)   Unknown      Medication List    STOP taking these medications   levofloxacin 500 MG tablet Commonly known as:  LEVAQUIN   meloxicam 7.5 MG tablet Commonly known as:  MOBIC     TAKE these medications   albuterol (  2.5 MG/3ML) 0.083% nebulizer solution Commonly known as:  PROVENTIL Take 3 mLs (2.5 mg total) by nebulization every 2 (two) hours as needed for wheezing.   amoxicillin-clavulanate 500-125 MG tablet Commonly known as:  AUGMENTIN Take 1 tablet (500 mg total) by mouth 2 (two) times daily.   aspirin 81 MG EC  tablet Take 1 tablet (81 mg total) by mouth daily.   bisacodyl 10 MG suppository Commonly known as:  DULCOLAX Place 1 suppository (10 mg total) rectally daily as needed for moderate constipation.   cholecalciferol 1000 units tablet Commonly known as:  VITAMIN D Take 1,000 Units by mouth every morning.   citalopram 20 MG tablet Commonly known as:  CELEXA TAKE 1 TABLET BY MOUTH EVERY MORNING   CREON 12000 units Cpep capsule Generic drug:  lipase/protease/amylase TAKE 1 CAPSULE BY MOUTH THREE TIMES DAILY WITH MEALS   docusate sodium 100 MG capsule Commonly known as:  COLACE Take 100 mg by mouth.   famotidine 20 MG tablet Commonly known as:  PEPCID Take 1 tablet (20 mg total) by mouth daily.   feeding supplement (ENSURE ENLIVE) Liqd Take 237 mLs by mouth 2 (two) times daily between meals.   furosemide 80 MG tablet Commonly known as:  LASIX Take 0.5 tablets (40 mg total) by mouth 2 (two) times daily. Start taking on:  03/27/2016 What changed:  See the new instructions.   gabapentin 100 MG capsule Commonly known as:  NEURONTIN Take 1-2 capsules (100-200 mg total) by mouth at bedtime.   guaiFENesin 600 MG 12 hr tablet Commonly known as:  MUCINEX Take 1 tablet (600 mg total) by mouth 2 (two) times daily.   hydrALAZINE 25 MG tablet Commonly known as:  APRESOLINE Take 1 tablet (25 mg total) by mouth 3 (three) times daily.   isosorbide mononitrate 30 MG 24 hr tablet Commonly known as:  IMDUR TAKE 1 TABLET BY MOUTH EVERY MORNING   JANUVIA 50 MG tablet Generic drug:  sitaGLIPtin TAKE 1 TABLET (50 MG TOTAL) BY MOUTH EVERY MORNING.   lansoprazole 30 MG capsule Commonly known as:  PREVACID TAKE 1 CAPSULE BY MOUTH EVERY MORNING   meclizine 12.5 MG tablet Commonly known as:  ANTIVERT TAKE 1 TABLET BY MOUTH 3 TIMES A DAY AS NEEDED FOR DIZZINESS   OLANZapine 5 MG tablet Commonly known as:  ZYPREXA TAKE 1 TABLET BY MOUTH EVERY MORNING   ondansetron 4 MG tablet Commonly  known as:  ZOFRAN Take 1 tablet (4 mg total) by mouth every 8 (eight) hours as needed for nausea or vomiting.   ONE TOUCH ULTRA TEST test strip Generic drug:  glucose blood USE TO CHECK BLOOD SUGAR ONCE A DAY AS DIRECTED   oseltamivir 30 MG capsule Commonly known as:  TAMIFLU Take 1 capsule (30 mg total) by mouth 2 (two) times daily.   potassium chloride 10 MEQ tablet Commonly known as:  K-DUR Take 1 tablet (10 mEq total) by mouth daily.   predniSONE 10 MG tablet Commonly known as:  DELTASONE Take 10 mg by mouth 3 (three) times daily.   QUEtiapine 50 MG tablet Commonly known as:  SEROQUEL TAKE 1 TABLET BY MOUTH DAILY AT BEDTIME   RESOURCE THICKENUP CLEAR Powd Use as recommended.   SYNTHROID 100 MCG tablet Generic drug:  levothyroxine TAKE 1 TABLET BY MOUTH DAILY BEFORE BREAKFAST   tamsulosin 0.4 MG Caps capsule Commonly known as:  FLOMAX TAKE 1 CAPSULE BY MOUTH AT BEDTIME   tiZANidine 4 MG tablet Commonly known as:  ZANAFLEX Take 1 tablet (4 mg total) by mouth every 6 (six) hours as needed for muscle spasms.   traMADol 50 MG tablet Commonly known as:  ULTRAM Take 1 tablet (50 mg total) by mouth every 8 (eight) hours as needed.       Allergies  Allergen Reactions  . Biaxin [Clarithromycin] Other (See Comments)    Unknown   . Other Other (See Comments)    Nephrologist has told patient not to eat many different foods (tomatoes, [employer], etc).   . Sulfa Antibiotics Other (See Comments)    Unknown     Consultations:  Cardiology.    Procedures/Studies: Dg Chest 2 View  Result Date: 03/18/2016 CLINICAL DATA:  Acute onset of progressive lethargy. Initial encounter. EXAM: CHEST  2 VIEW COMPARISON:  Chest radiograph performed 10/19/2014 FINDINGS: The lungs are well-aerated. Patchy right-sided airspace opacification raises concern for pneumonia. There is no evidence of pleural effusion or pneumothorax. The heart is borderline enlarged. No acute osseous  abnormalities are seen. IMPRESSION: 1. Patchy right-sided airspace opacification raises concern for pneumonia. 2. Borderline cardiomegaly. Electronically Signed   By: Garald Balding M.D.   On: 03/18/2016 01:16   Dg Abd 1 View  Result Date: 03/18/2016 CLINICAL DATA:  Abdominal distention.  History of colon cancer. EXAM: ABDOMEN - 1 VIEW COMPARISON:  CT abdomen pelvis 06/24/2015 FINDINGS: There is gas and stool seen within the colon. Anastomotic staple line is seen in the right lower quadrant. There is a paucity of small bowel gas. IMPRESSION: Paucity of small bowel gas without radiographic evidence of obstruction. Electronically Signed   By: Ulyses Jarred M.D.   On: 03/18/2016 05:46   Ct Head Wo Contrast  Result Date: 03/17/2016 CLINICAL DATA:  Altered mental status.  Lethargy. EXAM: CT HEAD WITHOUT CONTRAST TECHNIQUE: Contiguous axial images were obtained from the base of the skull through the vertex without intravenous contrast. COMPARISON:  06/24/2015 FINDINGS: Brain: Diffuse cerebral atrophy. Ventricular dilatation consistent with central atrophy. Low-attenuation changes in the deep white matter consistent with small vessel ischemia. No abnormal extra-axial fluid collections. No mass effect or midline shift. Gray-white matter junctions are distinct. Basal cisterns are not effaced. No acute intracranial hemorrhage. Vascular: Vascular calcifications. Skull: Normal. Negative for fracture or focal lesion. Sinuses/Orbits: Mild mucosal thickening in the paranasal sinuses with retention cyst in the right maxillary antrum. Mastoid air cells are not opacified. Other: No significant changes since prior study. IMPRESSION: No acute intracranial abnormalities. Chronic atrophy and small vessel ischemic changes. Electronically Signed   By: Lucienne Capers M.D.   On: 03/17/2016 23:57   Dg Abd Portable 1v  Result Date: 03/19/2016 CLINICAL DATA:  Nausea and vomiting with abdominal distention. EXAM: PORTABLE ABDOMEN -  1 VIEW COMPARISON:  03/18/2016. FINDINGS: Gaseous distention of the colon is improved. Cholecystectomy clips RIGHT upper quadrant. Within limits for assessment on supine portable abdomen, no visible obstruction or free air. IMPRESSION: Negative. Electronically Signed   By: Staci Righter M.D.   On: 03/19/2016 13:43     Subjective: No complaints  Discharge Exam: Vitals:   03/22/16 2223 03/23/16 0500  BP: (!) 193/70 113/70  Pulse: (!) 52 (!) 51  Resp: 20   Temp: 97.9 F (36.6 C) 97.5 F (36.4 C)   Vitals:   03/22/16 1434 03/22/16 2223 03/23/16 0500 03/23/16 0559  BP: (!) 149/79 (!) 193/70 113/70   Pulse: 62 (!) 52 (!) 51   Resp: 20 20    Temp: 97.8 F (36.6 C) 97.9 F (  36.6 C) 97.5 F (36.4 C)   TempSrc: Oral Oral Axillary   SpO2: 94% 95% 99%   Weight:    68.9 kg (151 lb 14.4 oz)  Height:        General: Pt is alert, awake, not in acute distress Cardiovascular: RRR, S1/S2 +, no rubs, no gallops Respiratory: CTA bilaterally, no wheezing, no rhonchi Abdominal: Soft, NT, ND, bowel sounds + Extremities: no edema, no cyanosis    The results of significant diagnostics from this hospitalization (including imaging, microbiology, ancillary and laboratory) are listed below for reference.     Microbiology: Recent Results (from the past 240 hour(s))  Blood culture (routine x 2)     Status: None (Preliminary result)   Collection Time: 03/18/16 12:33 AM  Result Value Ref Range Status   Specimen Description BLOOD RIGHT ANTECUBITAL  Final   Special Requests BOTTLES DRAWN AEROBIC AND ANAEROBIC 5CC EACH  Final   Culture   Final    NO GROWTH 4 DAYS Performed at [facility], [address], [place], [place] [address]    Report Status PENDING  Incomplete  Blood culture (routine x 2)     Status: None (Preliminary result)   Collection Time: 03/18/16  1:11 AM  Result Value Ref Range Status   Specimen Description BLOOD LEFT ANTECUBITAL  Final   Special Requests BOTTLES DRAWN  AEROBIC AND ANAEROBIC 5CC EACH  Final   Culture   Final    NO GROWTH 4 DAYS Performed at [facility], [place] [address], [place], [place] [address]    Report Status PENDING  Incomplete  MRSA PCR Screening     Status: None   Collection Time: 03/18/16  3:42 AM  Result Value Ref Range Status   MRSA by PCR NEGATIVE NEGATIVE Final    Comment:        The GeneXpert MRSA Assay (FDA approved for NASAL specimens only), is one component of a comprehensive MRSA colonization surveillance program. It is not intended to diagnose MRSA infection nor to guide or monitor treatment for MRSA infections.   Urine culture     Status: None   Collection Time: 03/18/16  4:28 AM  Result Value Ref Range Status   Specimen Description URINE, RANDOM  Final   Special Requests NONE  Final   Culture NO GROWTH Performed at [facility]   Final   Report Status 03/19/2016 FINAL  Final     Labs: BNP (last 3 results) No results for input(s): BNP in the last 8760 hours. Basic Metabolic Panel:  Recent Labs Lab 03/18/16 0337 03/19/16 0317 03/20/16 0803 03/21/16 0906 03/22/16 1128 03/23/16 0909  NA 143 148* 151* 148* 146* 142  K 3.5 2.9* 3.3* 3.8 4.1 4.3  CL 101 109 112* 113* 112* 109  CO2 28 27 27 23 23 22   GLUCOSE 161* 115* 215* 223* 254* 260*  BUN 67* 61* 55* 40* 32* 29*  CREATININE 2.80* 2.53* 2.07* 1.71* 1.50* 1.24  CALCIUM 8.5* 7.8* 8.0* 8.4* 8.9 8.6*  MG 2.2  --  2.4  --   --   --   PHOS 4.1  --   --   --   --   --    Liver Function Tests:  Recent Labs Lab 03/18/16 0337  AST 37  ALT 20  ALKPHOS 80  BILITOT 0.6  PROT 6.7  ALBUMIN 3.2*   No results for input(s): LIPASE, AMYLASE in the last 168 hours. No results for input(s): AMMONIA  in the last 168 hours. CBC:  Recent Labs Lab 03/17/16 2308 03/18/16 0337 03/19/16 0317 03/20/16 0803 03/22/16 1128  WBC 18.1* 15.0* 11.3* 7.0 9.9  NEUTROABS  --   --   --   --  8.2*  HGB 11.0* 10.3* 9.4* 10.1* 12.5*  HCT 33.6* 31.5*  28.6* 31.2* 36.3*  MCV 94.1 94.0 96.3 98.4 92.8  PLT 230 210 194 219 227   Cardiac Enzymes:  Recent Labs Lab 03/18/16 0116 03/18/16 0724 03/18/16 1534  CKTOTAL 718*  --   --   TROPONINI 0.13* 0.12* 0.10*   BNP: Invalid input(s): POCBNP CBG:  Recent Labs Lab 03/17/16 2331  GLUCAP 181*   D-Dimer No results for input(s): DDIMER in the last 72 hours. Hgb A1c No results for input(s): HGBA1C in the last 72 hours. Lipid Profile No results for input(s): CHOL, HDL, LDLCALC, TRIG, CHOLHDL, LDLDIRECT in the last 72 hours. Thyroid function studies No results for input(s): TSH, T4TOTAL, T3FREE, THYROIDAB in the last 72 hours.  Invalid input(s): FREET3 Anemia work up No results for input(s): VITAMINB12, FOLATE, FERRITIN, TIBC, IRON, RETICCTPCT in the last 72 hours. Urinalysis    Component Value Date/Time   COLORURINE YELLOW 12/23/2015 0939   APPEARANCEUR CLEAR 12/23/2015 0939   LABSPEC 1.010 12/23/2015 0939   PHURINE 6.0 12/23/2015 0939   GLUCOSEU NEGATIVE 12/23/2015 0939   HGBUR NEGATIVE 12/23/2015 0939   BILIRUBINUR NEGATIVE 12/23/2015 0939   BILIRUBINUR neg 12/29/2014 1913   KETONESUR NEGATIVE 12/23/2015 0939   PROTEINUR >300 (A) 06/24/2015 1126   UROBILINOGEN 0.2 12/23/2015 0939   NITRITE NEGATIVE 12/23/2015 0939   LEUKOCYTESUR NEGATIVE 12/23/2015 0939   Sepsis Labs Invalid input(s): PROCALCITONIN,  WBC,  LACTICIDVEN Microbiology Recent Results (from the past 240 hour(s))  Blood culture (routine x 2)     Status: None (Preliminary result)   Collection Time: 03/18/16 12:33 AM  Result Value Ref Range Status   Specimen Description BLOOD RIGHT ANTECUBITAL  Final   Special Requests BOTTLES DRAWN AEROBIC AND ANAEROBIC 5CC EACH  Final   Culture   Final    NO GROWTH 4 DAYS Performed at [facility], [place] [address], [place], [place] [address]    Report Status PENDING  Incomplete  Blood culture (routine x 2)     Status: None (Preliminary result)   Collection Time:  03/18/16  1:11 AM  Result Value Ref Range Status   Specimen Description BLOOD LEFT ANTECUBITAL  Final   Special Requests BOTTLES DRAWN AEROBIC AND ANAEROBIC 5CC EACH  Final   Culture   Final    NO GROWTH 4 DAYS Performed at [facility], [place] [address], [place], [place] [address]    Report Status PENDING  Incomplete  MRSA PCR Screening     Status: None   Collection Time: 03/18/16  3:42 AM  Result Value Ref Range Status   MRSA by PCR NEGATIVE NEGATIVE Final    Comment:        The GeneXpert MRSA Assay (FDA approved for NASAL specimens only), is one component of a comprehensive MRSA colonization surveillance program. It is not intended to diagnose MRSA infection nor to guide or monitor treatment for MRSA infections.   Urine culture     Status: None   Collection Time: 03/18/16  4:28 AM  Result Value Ref Range Status   Specimen Description URINE, RANDOM  Final   Special Requests NONE  Final   Culture NO GROWTH Performed at [facility]   Final  Report Status 03/19/2016 FINAL  Final     Time coordinating discharge: Over 30 minutes  SIGNED:   Hosie Poisson, MD  [facility] 03/23/2016, 12:30 PM Pager   If 7PM-7AM, please contact night-coverage [email or web address] Password [email or web address]

## 2016-03-23 NOTE — Progress Notes (Signed)
Pt being discharged to [facility]. Discharge instructions were put in packet. Report called to [facility], LPN. Currently awaiting [facility] pick up. No questions or concerns at this time.  Nathan Blackwell W Kennieth Plotts, RN

## 2016-03-23 NOTE — Progress Notes (Signed)
Inpatient Diabetes Program Recommendations  AACE/ADA: New Consensus Statement on Inpatient Glycemic Control (2015)  Target Ranges:  Prepandial:   less than 140 mg/dL      Peak postprandial:   less than 180 mg/dL (1-2 hours)      Critically ill patients:  140 - 180 mg/dL   Lab Results  Component Value Date   GLUCAP 181 (H) 03/17/2016   HGBA1C 7.1 (H) 03/18/2016   Results for TRUMON, BERDING (MRN [id number]) as of 03/23/2016 13:41  Ref. Range 03/22/2016 11:28 03/23/2016 09:09  Glucose Latest Ref Range: 65 - 99 mg/dL 254 (H) 260 (H)   Noted patient with History of DM per H&P notes.  No orders for CBG checks at present.  Please consider placing orders for Novolog Sensitive Correction Scale (0-9 units TID AC and 0-5 units QHS) while receiving steroids.  Thank you,  Windy Carina, RN, MSN Diabetes Coordinator Inpatient Diabetes Program [phone] (Team Pager)

## 2016-03-23 NOTE — Telephone Encounter (Signed)
[facility] #[phone] Fax: [phone] 

## 2016-03-23 NOTE — Clinical Social Work Placement (Addendum)
   CLINICAL SOCIAL WORK PLACEMENT  NOTE  Date:  03/23/2016  Patient Details  Name: Nathan Blackwell MRN: [id number] Date of Birth: 05-28-1920  Clinical Social Work is seeking post-discharge placement for this patient at the [place] level of care (*CSW will initial, date and re-position this form in  chart as items are completed):  Yes   Patient/family provided with [place] Work Department's list of facilities offering this level of care within the geographic area requested by the patient (or if unable, by the patient's family).  Yes   Patient/family informed of their freedom to choose among providers that offer the needed level of care, that participate in Medicare, Medicaid or managed care program needed by the patient, have an available bed and are willing to accept the patient.  Yes   Patient/family informed of [facility]'s ownership interest in [facility] and [facility], as well as of the fact that they are under no obligation to receive care at these facilities.  PASRR submitted to EDS on       PASRR number received on       Existing PASRR number confirmed on 03/21/16     FL2 transmitted to all facilities in geographic area requested by pt/family on       FL2 transmitted to all facilities within larger geographic area on 03/21/16     Patient informed that his/her managed care company has contracts with or will negotiate with certain facilities, including the following:  [employer]     Yes   Patient/family informed of bed offers received.  Patient chooses bed at [facility]     Physician recommends and patient chooses bed at [facility]    Patient to be transferred to [facility] on  .  Patient to be transferred to facility by [facility]     Patient family notified on   of transfer. 03/23/2016  Name of family member notified:     Daughter   PHYSICIAN       Additional Comment:     _______________________________________________ Lia Hopping, LCSW 03/23/2016, 9:38 AM

## 2016-03-24 ENCOUNTER — Telehealth: Payer: Self-pay | Admitting: *Deleted

## 2016-03-24 ENCOUNTER — Non-Acute Institutional Stay (SKILLED_NURSING_FACILITY): Payer: Medicare Other | Admitting: Adult Health

## 2016-03-24 ENCOUNTER — Encounter: Payer: Self-pay | Admitting: Adult Health

## 2016-03-24 DIAGNOSIS — R531 Weakness: Secondary | ICD-10-CM

## 2016-03-24 DIAGNOSIS — F0391 Unspecified dementia with behavioral disturbance: Secondary | ICD-10-CM | POA: Diagnosis not present

## 2016-03-24 DIAGNOSIS — E870 Hyperosmolality and hypernatremia: Secondary | ICD-10-CM

## 2016-03-24 DIAGNOSIS — I10 Essential (primary) hypertension: Secondary | ICD-10-CM | POA: Diagnosis not present

## 2016-03-24 DIAGNOSIS — N183 Chronic kidney disease, stage 3 unspecified: Secondary | ICD-10-CM

## 2016-03-24 DIAGNOSIS — K5901 Slow transit constipation: Secondary | ICD-10-CM

## 2016-03-24 DIAGNOSIS — J101 Influenza due to other identified influenza virus with other respiratory manifestations: Secondary | ICD-10-CM | POA: Diagnosis not present

## 2016-03-24 DIAGNOSIS — N179 Acute kidney failure, unspecified: Secondary | ICD-10-CM | POA: Diagnosis not present

## 2016-03-24 DIAGNOSIS — M069 Rheumatoid arthritis, unspecified: Secondary | ICD-10-CM

## 2016-03-24 DIAGNOSIS — I5032 Chronic diastolic (congestive) heart failure: Secondary | ICD-10-CM | POA: Diagnosis not present

## 2016-03-24 DIAGNOSIS — F339 Major depressive disorder, recurrent, unspecified: Secondary | ICD-10-CM

## 2016-03-24 DIAGNOSIS — J9601 Acute respiratory failure with hypoxia: Secondary | ICD-10-CM | POA: Diagnosis not present

## 2016-03-24 DIAGNOSIS — N400 Enlarged prostate without lower urinary tract symptoms: Secondary | ICD-10-CM

## 2016-03-24 DIAGNOSIS — K219 Gastro-esophageal reflux disease without esophagitis: Secondary | ICD-10-CM

## 2016-03-24 DIAGNOSIS — J189 Pneumonia, unspecified organism: Secondary | ICD-10-CM

## 2016-03-24 DIAGNOSIS — E876 Hypokalemia: Secondary | ICD-10-CM

## 2016-03-24 DIAGNOSIS — G629 Polyneuropathy, unspecified: Secondary | ICD-10-CM

## 2016-03-24 DIAGNOSIS — I25810 Atherosclerosis of coronary artery bypass graft(s) without angina pectoris: Secondary | ICD-10-CM

## 2016-03-24 DIAGNOSIS — E1122 Type 2 diabetes mellitus with diabetic chronic kidney disease: Secondary | ICD-10-CM

## 2016-03-24 DIAGNOSIS — E440 Moderate protein-calorie malnutrition: Secondary | ICD-10-CM

## 2016-03-24 NOTE — Telephone Encounter (Signed)
Pt was on TCM list admitted 1/12 for weakness and found to have a flow is a infection and possible concurrent community acquired pneumonia. Pt was D/C 1/18, and sent to SNF...Nathan Blackwell

## 2016-03-24 NOTE — Progress Notes (Signed)
DATE:  03/24/2016   MRN:  [id number]  BIRTHDAY: [birth year]  Facility:  Nursing Home Location:  [place] [facility] [place] Room Number: 1203-P  LEVEL OF CARE:  [id number])  Contact Information    Name Relation Home Work [place] Spouse [phone]  [phone]   Almus, Pucillo   [phone]   Edmon Crape Daughter   [phone]       Code Status History    Date Active Date Inactive Code Status Order ID Comments User Context   03/18/2016  2:38 AM 03/23/2016  8:21 PM DNR [id number]  Toy Baker, MD Inpatient   10/19/2014 10:08 PM 10/22/2014  1:45 PM DNR [id number]  Toy Baker, MD Inpatient   08/18/2014 12:44 AM 08/21/2014  7:17 PM Full Code [id number]  Ivor Costa, MD Inpatient   05/25/2013  2:40 AM 05/26/2013  1:35 PM Full Code [id number]  Theressa Millard, MD Inpatient   01/07/2011  5:05 PM 01/08/2011  3:38 PM Full Code [id number]  Pilar Plate, RN Inpatient    Questions for Most Recent Historical Code Status (Order [id number])    Question Answer Comment   In the event of cardiac or respiratory ARREST Do not call a "code blue"    In the event of cardiac or respiratory ARREST Do not perform Intubation, CPR, defibrillation or ACLS    In the event of cardiac or respiratory ARREST Use medication by any route, position, wound care, and other measures to relive pain and suffering. May use oxygen, suction and manual treatment of airway obstruction as needed for comfort.         Advance Directive Documentation   Flowsheet Row Most Recent Value  Type of Advance Directive  Out of facility DNR (pink MOST or yellow form)  Pre-existing out of facility DNR order (yellow form or pink MOST form)  No data  "MOST" Form in Place?  No data       Chief Complaint  Patient presents with  . Hospitalization Follow-up    HISTORY OF PRESENT ILLNESS:  This is a 89-YO male seen for hospital follow-up.  He was admitted to [facility] on 03/23/2016 after an admission at [facility] 03/17/2016-03/23/2016 for flu and  community acquired pneumonia. He was started on IV Vancomycin and Zosyn then changed to Augmentin. He was, also, started on Tamiflu. He had hypernatremia secondary to decreased oral intake.  He was given IV fluids.  He was seen in the room today. He complained of constipation.     PAST MEDICAL HISTORY:  Past Medical History:  Diagnosis Date  . Anemia    NOS  . ANEMIA-NOS 06/23/2008  . ARTHRITIS 02/23/2007  . B12 deficiency 05/25/2010  . BACK PAIN, THORACIC REGION 06/04/2008  . BENIGN PROSTATIC HYPERTROPHY 10/08/2006  . BRADYCARDIA 06/10/2009  . Cancer of colon ([place]) dx'd 1998  . 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 CLOSTRIDIUM DIFFICILE COLITIS 01/30/2007  . Colitis, Clostridium difficile    presumed 11/08  . COLON CANCER, HX OF 09/20/2006  . COLONIC POLYPS, HX OF 10/08/2006  . CONSTIPATION, RECURRENT 01/06/2010  . Dementia   . DEMENTIA 05/14/2007  . DEPRESSION 02/27/2007  . Diabetes mellitus ([place])   . Diabetes mellitus type II   . DIABETES MELLITUS, TYPE II 09/20/2006  . Diastolic congestive heart failure ([place])   . DVT of lower limb, acute ([place]) 01/12/2011  . Elevated PSA   . GERD 01/14/2007  . GERD (gastroesophageal reflux  disease)   . Hyperlipidemia   . HYPERLIPIDEMIA 09/20/2006  . Hypertension   . HYPERTENSION 09/20/2006  . Hypothyroidism   . HYPOTHYROIDISM 01/14/2007  . Hypothyroidism   . 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 Nephrolithiasis   . NEPHROLITHIASIS, HX OF 01/14/2007  . OSA (obstructive sleep apnea)   . Osteoporosis   . OSTEOPOROSIS 10/08/2006  . PERIPHERAL EDEMA 09/03/2007  . Prostatic hypertrophy    benign  . RENAL INSUFFICIENCY 04/01/2010  . Rotator cuff syndrome    chronic  . Sleep apnea    CPAP dependent  . SLEEP APNEA, OBSTRUCTIVE 01/14/2007  . Thyroid nodule   . THYROID NODULE 01/14/2007  . Vitamin B 12 deficiency      CURRENT MEDICATIONS: Reviewed  Patient's  Medications  New Prescriptions   No medications on file  Previous Medications   ALBUTEROL (PROVENTIL) (2.5 MG/3ML) 0.083% NEBULIZER SOLUTION    Take 3 mLs (2.5 mg total) by nebulization every 2 (two) hours as needed for wheezing.   AMOXICILLIN-CLAVULANATE (AUGMENTIN) 500-125 MG TABLET    Take 1 tablet (500 mg total) by mouth 2 (two) times daily.   ASPIRIN EC 81 MG EC TABLET    Take 1 tablet (81 mg total) by mouth daily.   BISACODYL (DULCOLAX) 10 MG SUPPOSITORY    Place 1 suppository (10 mg total) rectally daily as needed for moderate constipation.   CHOLECALCIFEROL (VITAMIN D) 1000 UNITS TABLET    Take 1,000 Units by mouth every morning.    CITALOPRAM (CELEXA) 20 MG TABLET    TAKE 1 TABLET BY MOUTH EVERY MORNING   CREON 12000 UNITS CPEP CAPSULE    TAKE 1 CAPSULE BY MOUTH THREE TIMES DAILY WITH MEALS   DOCUSATE SODIUM (COLACE) 100 MG CAPSULE    Take 100 mg by mouth.   FAMOTIDINE (PEPCID) 20 MG TABLET    Take 1 tablet (20 mg total) by mouth daily.   FUROSEMIDE (LASIX) 80 MG TABLET    Take 0.5 tablets (40 mg total) by mouth 2 (two) times daily.   GABAPENTIN (NEURONTIN) 100 MG CAPSULE    Take 100 mg by mouth at bedtime.   GUAIFENESIN (MUCINEX) 600 MG 12 HR TABLET    Take 1 tablet (600 mg total) by mouth 2 (two) times daily.   HYDRALAZINE (APRESOLINE) 25 MG TABLET    Take 1 tablet (25 mg total) by mouth 3 (three) times daily.   ISOSORBIDE MONONITRATE (IMDUR) 30 MG 24 HR TABLET    TAKE 1 TABLET BY MOUTH EVERY MORNING   JANUVIA 50 MG TABLET    TAKE 1 TABLET (50 MG TOTAL) BY MOUTH EVERY MORNING.   LANSOPRAZOLE (PREVACID) 30 MG CAPSULE    TAKE 1 CAPSULE BY MOUTH EVERY MORNING   MECLIZINE (ANTIVERT) 12.5 MG TABLET    TAKE 1 TABLET BY MOUTH 3 TIMES A DAY AS NEEDED FOR DIZZINESS   NUTRITIONAL SUPPLEMENT LIQD    Take 120 mLs by mouth 2 (two) times daily between meals.   OLANZAPINE (ZYPREXA) 5 MG TABLET    TAKE 1 TABLET BY MOUTH EVERY MORNING   ONDANSETRON (ZOFRAN) 4 MG TABLET    Take 1 tablet (4 mg total)  by mouth every 8 (eight) hours as needed for nausea or vomiting.   ONE TOUCH ULTRA TEST TEST STRIP    USE TO CHECK BLOOD SUGAR ONCE A DAY AS DIRECTED   OSELTAMIVIR (TAMIFLU) 30 MG CAPSULE    Take 1 capsule (30 mg total) by mouth 2 (two) times daily.  POTASSIUM CHLORIDE (K-DUR) 10 MEQ TABLET    Take 1 tablet (10 mEq total) by mouth daily.   PREDNISONE (DELTASONE) 10 MG TABLET    Take 10 mg by mouth 3 (three) times daily. 10 mg TID x3 days, then 10 mg BID x3 days, then 10 mg po QD   QUETIAPINE (SEROQUEL) 50 MG TABLET    TAKE 1 TABLET BY MOUTH DAILY AT BEDTIME   SYNTHROID 100 MCG TABLET    TAKE 1 TABLET BY MOUTH DAILY BEFORE BREAKFAST   TAMSULOSIN (FLOMAX) 0.4 MG CAPS CAPSULE    TAKE 1 CAPSULE BY MOUTH AT BEDTIME   TIZANIDINE (ZANAFLEX) 4 MG TABLET    Take 1 tablet (4 mg total) by mouth every 6 (six) hours as needed for muscle spasms.   TRAMADOL (ULTRAM) 50 MG TABLET    Take 1 tablet (50 mg total) by mouth every 8 (eight) hours as needed. For 14 days  Modified Medications   No medications on file  Discontinued Medications   FEEDING SUPPLEMENT, ENSURE ENLIVE, (ENSURE ENLIVE) LIQD    Take 237 mLs by mouth 2 (two) times daily between meals.   GABAPENTIN (NEURONTIN) 100 MG CAPSULE    Take 1-2 capsules (100-200 mg total) by mouth at bedtime.   MALTODEXTRIN-XANTHAN GUM (RESOURCE THICKENUP CLEAR) POWD    Use as recommended.     Allergies  Allergen Reactions  . Biaxin [Clarithromycin] Other (See Comments)    Unknown   . Other Other (See Comments)    Nephrologist has told patient not to eat many different foods (tomatoes, [employer], etc).   . Sulfa Antibiotics Other (See Comments)    Unknown      REVIEW OF SYSTEMS:  GENERAL: no change in appetite, no fatigue, no weight changes, no fever, chills or weakness SKIN: Denies rash, itching, wounds, ulcer sores, or nail abnormality EYES: Denies change in vision, dry eyes, eye pain, itching or discharge EARS: Denies change in hearing, ringing in  ears, or earache NOSE: Denies nasal congestion or epistaxis MOUTH and THROAT: Denies oral discomfort, gingival pain or bleeding, pain from teeth or hoarseness   RESPIRATORY: no cough, SOB, DOE, wheezing, hemoptysis CARDIAC: no chest pain, edema or palpitations GI: no abdominal pain, diarrhea, heart burn, nausea or vomiting,+constipation GU: Denies dysuria, frequency, hematuria, incontinence, or discharge PSYCHIATRIC: Denies feeling of depression or anxiety. No report of hallucinations, insomnia, paranoia, or agitation   PHYSICAL EXAMINATION  GENERAL APPEARANCE: Well nourished. In no acute distress. Normal body habitus SKIN:  Skin is warm and dry. There are no suspicious lesions or rash HEAD: Normal in size and contour. No evidence of trauma EYES: Lids open and close normally. No blepharitis, entropion or ectropion. PERRL. Conjunctivae are clear and sclerae are white. Lenses are without opacity EARS: Pinnae are normal. Has bilateral hearing aides but still hard of hearing MOUTH and THROAT: Lips are without lesions. Oral mucosa is moist and without lesions. Tongue is normal in shape, size, and color and without lesions NECK: supple, trachea midline, no neck masses, no thyroid tenderness, no thyromegaly LYMPHATICS: no LAN in the neck, no supraclavicular LAN RESPIRATORY: breathing is even & unlabored, BS CTAB CARDIAC: RRR, no murmur,no extra heart sounds, no edema GI: abdomen soft, normal BS, no masses, no tenderness, no hepatomegaly, no splenomegaly EXTREMITIES:  Able to move X 4 extremities PSYCHIATRIC: Alert to person and time, disoriented to place. Affect and behavior are appropriate    LABS/RADIOLOGY: Labs reviewed: Basic Metabolic Panel:  Recent Labs  03/18/16 [id number]  03/20/16 0803 03/21/16 0906 03/22/16 1128 03/23/16 0909  NA 143  < > 151* 148* 146* 142  K 3.5  < > 3.3* 3.8 4.1 4.3  CL 101  < > 112* 113* 112* 109  CO2 28  < > 27 23 23 22   GLUCOSE 161*  < > 215* 223* 254*  260*  BUN 67*  < > 55* 40* 32* 29*  CREATININE 2.80*  < > 2.07* 1.71* 1.50* 1.24  CALCIUM 8.5*  < > 8.0* 8.4* 8.9 8.6*  MG 2.2  --  2.4  --   --   --   PHOS 4.1  --   --   --   --   --   < > = values in this interval not displayed. Liver Function Tests:  Recent Labs  06/24/15 1100 12/23/15 0939 03/18/16 0337  AST 27 14 37  ALT 17 9 20   ALKPHOS 92 66 80  BILITOT 1.1 0.4 0.6  PROT 8.3* 7.3 6.7  ALBUMIN 4.6 4.1 3.2*    Recent Labs  06/24/15 1100  LIPASE 30   CBC:  Recent Labs  12/23/15 0939  03/19/16 0317 03/20/16 0803 03/22/16 1128  WBC 9.0  < > 11.3* 7.0 9.9  NEUTROABS 7.5  --   --   --  8.2*  HGB 11.8*  < > 9.4* 10.1* 12.5*  HCT 35.5*  < > 28.6* 31.2* 36.3*  MCV 95.6  < > 96.3 98.4 92.8  PLT 240.0  < > 194 219 227  < > = values in this interval not displayed. Lipid Panel:  Recent Labs  12/23/15 0939  HDL 42.60   Cardiac Enzymes:  Recent Labs  03/18/16 0116 03/18/16 0724 03/18/16 1534  CKTOTAL 718*  --   --   TROPONINI 0.13* 0.12* 0.10*   CBG:  Recent Labs  03/17/16 2331  GLUCAP 181*      Dg Chest 2 View  Result Date: 03/18/2016 CLINICAL DATA:  Acute onset of progressive lethargy. Initial encounter. EXAM: CHEST  2 VIEW COMPARISON:  Chest radiograph performed 10/19/2014 FINDINGS: The lungs are well-aerated. Patchy right-sided airspace opacification raises concern for pneumonia. There is no evidence of pleural effusion or pneumothorax. The heart is borderline enlarged. No acute osseous abnormalities are seen. IMPRESSION: 1. Patchy right-sided airspace opacification raises concern for pneumonia. 2. Borderline cardiomegaly. Electronically Signed   By: Garald Balding M.D.   On: 03/18/2016 01:16   Dg Abd 1 View  Result Date: 03/18/2016 CLINICAL DATA:  Abdominal distention.  History of colon cancer. EXAM: ABDOMEN - 1 VIEW COMPARISON:  CT abdomen pelvis 06/24/2015 FINDINGS: There is gas and stool seen within the colon. Anastomotic staple line is seen  in the right lower quadrant. There is a paucity of small bowel gas. IMPRESSION: Paucity of small bowel gas without radiographic evidence of obstruction. Electronically Signed   By: Ulyses Jarred M.D.   On: 03/18/2016 05:46   Ct Head Wo Contrast  Result Date: 03/17/2016 CLINICAL DATA:  Altered mental status.  Lethargy. EXAM: CT HEAD WITHOUT CONTRAST TECHNIQUE: Contiguous axial images were obtained from the base of the skull through the vertex without intravenous contrast. COMPARISON:  06/24/2015 FINDINGS: Brain: Diffuse cerebral atrophy. Ventricular dilatation consistent with central atrophy. Low-attenuation changes in the deep white matter consistent with small vessel ischemia. No abnormal extra-axial fluid collections. No mass effect or midline shift. Gray-white matter junctions are distinct. Basal cisterns are not effaced. No acute intracranial hemorrhage. Vascular: Vascular calcifications. Skull: Normal. Negative for  fracture or focal lesion. Sinuses/Orbits: Mild mucosal thickening in the paranasal sinuses with retention cyst in the right maxillary antrum. Mastoid air cells are not opacified. Other: No significant changes since prior study. IMPRESSION: No acute intracranial abnormalities. Chronic atrophy and small vessel ischemic changes. Electronically Signed   By: Lucienne Capers M.D.   On: 03/17/2016 23:57   Dg Abd Portable 1v  Result Date: 03/19/2016 CLINICAL DATA:  Nausea and vomiting with abdominal distention. EXAM: PORTABLE ABDOMEN - 1 VIEW COMPARISON:  03/18/2016. FINDINGS: Gaseous distention of the colon is improved. Cholecystectomy clips RIGHT upper quadrant. Within limits for assessment on supine portable abdomen, no visible obstruction or free air. IMPRESSION: Negative. Electronically Signed   By: Staci Righter M.D.   On: 03/19/2016 13:43    ASSESSMENT/PLAN:  Generalized weakness - for rehabilitation, PT and OT, for therapeutic strengthening exercises; fall precautions  Acute  respiratory failure with hypoxia - related to PNA and influenza; no SOB @ this time; continue albuterol when necessary  Influenza A infection - continue renally dose Tamiflu;   Acute on chronic kidney disease, stage III -  creatinine 1.24; baseline creatinine of 1.6 - 2.82; was given IV fuids; check BMP  Community acquired pneumonia - initially was given IV vancomycin and Zosyn then changed to Augmentin  Hypernatremia - Na  142, related to decreased oral intake, was given IV fluids; will monitor  Essential hypertension - continue hydralazine 25 mg 1 tab by mouth 3 times a day  Chronic diastolic heart failure - EF 55%; compensated; continue Lasix  40 mg 1 tab PO BID  Dementia with behavioral disturbance - continue supportive care; fall precautions; continue Zyprexa 5 mg 1 tab by mouth daily  Constipation - discontinue Colace and start senna S 2 tabs by mouth twice a day and MiraLAX 17 g by mouth daily  Major depression -  continue Celexa 20 mg 1 tab by mouth daily  BPH - continue Flomax 0.4 mg 1 capsule by mouth daily at bedtime  Rheumatoid arthritis - continue prednisone 10 mg by mouth  TID X 3 days then twice a day 3 days then prednisone 10 mg by mouth daily   Neuropathy - continue gabapentin 100 mg 1 capsule by mouth daily at bedtime and tramadol 50 mg 1 tab by mouth every 8 hours when necessary 14 days  GERD - continue Prevacid 30 mg 1 capsule by mouth daily  Hypokalemia - continue KCL ER 10 meq 1 tab PO Q D  Protein calorie malnutrition - continue med Pass 120 mL by mouth twice a day  CAD - no chest pain; continue Imdur 30 mg 24 hr 1 tab PO Q D  Diabetes mellitus, type 2 - continue Januvia 50 mg 1 tab by mouth every morning Lab Results  Component Value Date   HGBA1C 7.1 (H) 03/18/2016        Goals of care:  Short-term rehabilitation    Monina C. [place] - NP  [employer] [phone]

## 2016-03-28 ENCOUNTER — Non-Acute Institutional Stay (SKILLED_NURSING_FACILITY): Payer: Medicare Other | Admitting: Internal Medicine

## 2016-03-28 ENCOUNTER — Encounter: Payer: Self-pay | Admitting: Internal Medicine

## 2016-03-28 DIAGNOSIS — M129 Arthropathy, unspecified: Secondary | ICD-10-CM | POA: Diagnosis not present

## 2016-03-28 DIAGNOSIS — E039 Hypothyroidism, unspecified: Secondary | ICD-10-CM

## 2016-03-28 DIAGNOSIS — R5381 Other malaise: Secondary | ICD-10-CM

## 2016-03-28 DIAGNOSIS — H8110 Benign paroxysmal vertigo, unspecified ear: Secondary | ICD-10-CM | POA: Diagnosis not present

## 2016-03-28 DIAGNOSIS — J189 Pneumonia, unspecified organism: Secondary | ICD-10-CM | POA: Diagnosis not present

## 2016-03-28 DIAGNOSIS — L89210 Pressure ulcer of right hip, unstageable: Secondary | ICD-10-CM | POA: Diagnosis not present

## 2016-03-28 DIAGNOSIS — J101 Influenza due to other identified influenza virus with other respiratory manifestations: Secondary | ICD-10-CM

## 2016-03-28 DIAGNOSIS — E1140 Type 2 diabetes mellitus with diabetic neuropathy, unspecified: Secondary | ICD-10-CM

## 2016-03-28 DIAGNOSIS — I5032 Chronic diastolic (congestive) heart failure: Secondary | ICD-10-CM

## 2016-03-28 DIAGNOSIS — I2510 Atherosclerotic heart disease of native coronary artery without angina pectoris: Secondary | ICD-10-CM

## 2016-03-28 DIAGNOSIS — N183 Chronic kidney disease, stage 3 unspecified: Secondary | ICD-10-CM

## 2016-03-28 NOTE — Progress Notes (Signed)
LOCATION: [facility]  PCP: [name], MD   Code Status: DNR  Goals of care: Advanced Directive information Advanced Directives 03/24/2016  Does Patient Have a Medical Advance Directive? Yes  Type of Advance Directive Out of facility DNR (pink MOST or yellow form)  Does patient want to make changes to medical advance directive? No - Patient declined  Copy of [facility] in Chart? -  Would patient like information on creating a medical advance directive? No - Patient declined       Extended Emergency Contact Information Primary Emergency Contact: Fraiser,Shirley Address: [address]          [place], [place] [address] Johnnette Litter of [place] Phone: [phone] Mobile Phone: [phone] Relation: Spouse Secondary Emergency Contact: Clance Boll [place] of [place] Mobile Phone: [phone] Relation: Son   Allergies  Allergen Reactions  . Biaxin [Clarithromycin] Other (See Comments)    Unknown   . Other Other (See Comments)    Nephrologist has told patient not to eat many different foods (tomatoes, [employer], etc).   . Sulfa Antibiotics Other (See Comments)    Unknown     Chief Complaint  Patient presents with  . New Admit To SNF    New Admission Visit      HPI:  Patient is a 83 y.o. male seen today for short term rehabilitation post hospital admission from 03/17/2016-03/23/2016 with acute respiratory failure from influenza A and pneumonia, acute on chronic kidney disease. He is seen in his room today. He has medical history of coronary artery disease, hypertension, type 2 diabetes mellitus, chronic kidney disease, DVT among others.  Review of Systems:  Constitutional: Negative for fever, chills, diaphoresis.  HENT: Negative for headache, congestion, sore throat, difficulty swallowing.   Eyes: Negative for eye pain, blurred vision, double vision.  Respiratory: Negative for shortness of breath and wheezing.    positive for cough and runny nose. Cardiovascular: Negative for chest pain, palpitations, leg swelling.  Gastrointestinal: Negative for heartburn, nausea, vomiting, abdominal pain, loss of appetite and constipation. He had a bowel movement this morning. Genitourinary: Negative for dysuria and flank pain.  Musculoskeletal: Negative for back pain, fall.  he denies any pain this visit. Skin: Negative for itching, rash.  Neurological: Positive for occasional dizziness. Psychiatric/Behavioral: Negative for depression   Past Medical History:  Diagnosis Date  . Anemia    NOS  . ANEMIA-NOS 06/23/2008  . ARTHRITIS 02/23/2007  . B12 deficiency 05/25/2010  . BACK PAIN, THORACIC REGION 06/04/2008  . BENIGN PROSTATIC HYPERTROPHY 10/08/2006  . BRADYCARDIA 06/10/2009  . Cancer of colon ([place]) dx'd 1998  . 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 CLOSTRIDIUM DIFFICILE COLITIS 01/30/2007  . Colitis, Clostridium difficile    presumed 11/08  . COLON CANCER, HX OF 09/20/2006  . COLONIC POLYPS, HX OF 10/08/2006  . CONSTIPATION, RECURRENT 01/06/2010  . Dementia   . DEMENTIA 05/14/2007  . DEPRESSION 02/27/2007  . Diabetes mellitus ([place])   . Diabetes mellitus type II   . DIABETES MELLITUS, TYPE II 09/20/2006  . Diastolic congestive heart failure ([place])   . DVT of lower limb, acute ([place]) 01/12/2011  . Elevated PSA   . GERD 01/14/2007  . GERD (gastroesophageal reflux disease)   . Hyperlipidemia   . HYPERLIPIDEMIA 09/20/2006  . Hypertension   . HYPERTENSION 09/20/2006  . Hypothyroidism   . HYPOTHYROIDISM 01/14/2007  . Hypothyroidism   . 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 Nephrolithiasis   .  NEPHROLITHIASIS, HX OF 01/14/2007  . OSA (obstructive sleep apnea)   . Osteoporosis   . OSTEOPOROSIS 10/08/2006  . PERIPHERAL EDEMA 09/03/2007  . Prostatic hypertrophy    benign  . RENAL INSUFFICIENCY 04/01/2010  . Rotator cuff syndrome    chronic  . Sleep apnea    CPAP dependent  . SLEEP APNEA, OBSTRUCTIVE 01/14/2007   . Thyroid nodule   . THYROID NODULE 01/14/2007  . Vitamin B 12 deficiency    Past Surgical History:  Procedure Laterality Date  . APPENDECTOMY  1998  . CATARACT EXTRACTION, BILATERAL    . CHOLECYSTECTOMY  1999  . COLON RESECTION  1998  . ESOPHAGOGASTRODUODENOSCOPY N/A 12/30/2013   Procedure: ESOPHAGOGASTRODUODENOSCOPY (EGD);  Surgeon: Inda Castle, MD;  Location: [facility];  Service: Endoscopy;  Laterality: N/A;  . ESOPHAGOGASTRODUODENOSCOPY (EGD) WITH PROPOFOL N/A 12/19/2013   Procedure: ESOPHAGOGASTRODUODENOSCOPY (EGD) WITH PROPOFOL;  Surgeon: Inda Castle, MD;  Location: [facility];  Service: Endoscopy;  Laterality: N/A;  . inguinal herniorrhapy  1984  . NM LEXISCAN MYOVIEW LTD  09/02/2013   Normal LV function and wall motion. EF 62%; rate dependent LBBB with Lexiscan, Low Risk fixed inferior defect suggestive of diaphragmatic attenuation and not infarct with normal wall motion.  Marland Kitchen SAVORY DILATION N/A 12/19/2013   Procedure: SAVORY DILATION;  Surgeon: Inda Castle, MD;  Location: [facility];  Service: Endoscopy;  Laterality: N/A;  With Fluoroscopy  . SAVORY DILATION N/A 12/30/2013   Procedure: SAVORY DILATION;  Surgeon: Inda Castle, MD;  Location: [facility];  Service: Endoscopy;  Laterality: N/A;  . skin cancer extraction     right ear-extensive scar  . TRANSTHORACIC ECHOCARDIOGRAM  08/03/2009   Normal LV size. Mild LVH. EF 60-65%. Gr 2 DD, mild MR.   Social History:   reports that he has never smoked. He has never used smokeless tobacco. He reports that he does not drink alcohol or use drugs.  Family History  Problem Relation Age of Onset  . Stroke Father   . Diabetes Mother     Medications: Allergies as of 03/28/2016      Reactions   Biaxin [clarithromycin] Other (See Comments)   Unknown   Other Other (See Comments)   Nephrologist has told patient not to eat many different foods (tomatoes, [employer], etc).    Sulfa Antibiotics Other (See  Comments)   Unknown      Medication List       Accurate as of 03/28/16  3:44 PM. Always use your most recent med list.          albuterol (2.5 MG/3ML) 0.083% nebulizer solution Commonly known as:  PROVENTIL Take 3 mLs (2.5 mg total) by nebulization every 2 (two) hours as needed for wheezing.   aspirin 81 MG EC tablet Take 1 tablet (81 mg total) by mouth daily.   bisacodyl 10 MG suppository Commonly known as:  DULCOLAX Place 1 suppository (10 mg total) rectally daily as needed for moderate constipation.   cholecalciferol 1000 units tablet Commonly known as:  VITAMIN D Take 1,000 Units by mouth every morning.   citalopram 20 MG tablet Commonly known as:  CELEXA TAKE 1 TABLET BY MOUTH EVERY MORNING   CREON 12000 units Cpep capsule Generic drug:  lipase/protease/amylase TAKE 1 CAPSULE BY MOUTH THREE TIMES DAILY WITH MEALS   DECUBI-VITE PO Take 1 tablet by mouth daily.   famotidine 20 MG tablet Commonly known as:  PEPCID Take 1 tablet (20 mg total) by mouth daily.  furosemide 80 MG tablet Commonly known as:  LASIX Take 0.5 tablets (40 mg total) by mouth 2 (two) times daily.   gabapentin 100 MG capsule Commonly known as:  NEURONTIN Take 100 mg by mouth at bedtime.   guaiFENesin 600 MG 12 hr tablet Commonly known as:  MUCINEX Take 1 tablet (600 mg total) by mouth 2 (two) times daily.   hydrALAZINE 25 MG tablet Commonly known as:  APRESOLINE Take 1 tablet (25 mg total) by mouth 3 (three) times daily.   isosorbide mononitrate 30 MG 24 hr tablet Commonly known as:  IMDUR TAKE 1 TABLET BY MOUTH EVERY MORNING   JANUVIA 50 MG tablet Generic drug:  sitaGLIPtin TAKE 1 TABLET (50 MG TOTAL) BY MOUTH EVERY MORNING.   lansoprazole 30 MG capsule Commonly known as:  PREVACID TAKE 1 CAPSULE BY MOUTH EVERY MORNING   meclizine 12.5 MG tablet Commonly known as:  ANTIVERT TAKE 1 TABLET BY MOUTH 3 TIMES A DAY AS NEEDED FOR DIZZINESS   NUTRITIONAL SUPPLEMENT Liqd Take  120 mLs by mouth 2 (two) times daily between meals.   OLANZapine 5 MG tablet Commonly known as:  ZYPREXA TAKE 1 TABLET BY MOUTH EVERY MORNING   ondansetron 4 MG tablet Commonly known as:  ZOFRAN Take 1 tablet (4 mg total) by mouth every 8 (eight) hours as needed for nausea or vomiting.   ONE TOUCH ULTRA TEST test strip Generic drug:  glucose blood USE TO CHECK BLOOD SUGAR ONCE A DAY AS DIRECTED   polyethylene glycol packet Commonly known as:  MIRALAX / GLYCOLAX Take 17 g by mouth daily.   potassium chloride 10 MEQ tablet Commonly known as:  K-DUR Take 1 tablet (10 mEq total) by mouth daily.   predniSONE 10 MG tablet Commonly known as:  DELTASONE Take 10 mg by mouth 3 (three) times daily. 10 mg TID x3 days, then 10 mg BID x3 days, then 10 mg po QD   QUEtiapine 50 MG tablet Commonly known as:  SEROQUEL TAKE 1 TABLET BY MOUTH DAILY AT BEDTIME   sennosides-docusate sodium 8.6-50 MG tablet Commonly known as:  SENOKOT-S Take 2 tablets by mouth 2 (two) times daily.   SYNTHROID 100 MCG tablet Generic drug:  levothyroxine TAKE 1 TABLET BY MOUTH DAILY BEFORE BREAKFAST   tamsulosin 0.4 MG Caps capsule Commonly known as:  FLOMAX TAKE 1 CAPSULE BY MOUTH AT BEDTIME   tiZANidine 4 MG tablet Commonly known as:  ZANAFLEX Take 1 tablet (4 mg total) by mouth every 6 (six) hours as needed for muscle spasms.   traMADol 50 MG tablet Commonly known as:  ULTRAM Take 1 tablet (50 mg total) by mouth every 8 (eight) hours as needed. For 14 days   zinc oxide 20 % ointment Apply 1 application topically 3 (three) times daily.       Immunizations: Immunization History  Administered Date(s) Administered  . Influenza Split 12/06/2010, 12/07/2011  . Influenza Whole 12/05/2006, 12/31/2007, 12/21/2008, 11/18/2009  . Influenza, High Dose Seasonal PF 12/17/2012  . Influenza,inj,Quad PF,36+ Mos 10/30/2013  . Influenza-Unspecified 12/29/2014  . PPD Test 03/23/2016  . Pneumococcal  Conjugate-13 05/05/2014  . Pneumococcal Polysaccharide-23 01/07/2007, 12/21/2008  . Tdap 09/04/2012     Physical Exam: Vitals:   03/28/16 1535  BP: 133/84  Pulse: 63  Resp: 18  Temp: 97.2 F (36.2 C)  TempSrc: Oral  SpO2: 96%  Weight: 138 lb 9.6 oz (62.9 kg)   Body mass index is 19.89 kg/m.  General- elderly male, frail and  thin built, in no acute distress Head- normocephalic, atraumatic, has hearing aid Nose- no nasal discharge Throat- moist mucus membrane Eyes- PERRLA, EOMI, no pallor, no icterus, no discharge, normal conjunctiva, normal sclera Neck- no cervical lymphadenopathy Cardiovascular- normal s1,s2, no murmur Respiratory- bilateral decreased air entry, no wheeze, no rhonchi, no crackles, no use of accessory muscles Abdomen- bowel sounds present, soft, non tender Musculoskeletal- able to move all 4 extremities, no leg edema Neurological- alert and oriented to person, place and time Skin- warm and dry Psychiatry- normal mood and affect    Labs reviewed: Basic Metabolic Panel:  Recent Labs  03/18/16 0337  03/20/16 0803 03/21/16 0906 03/22/16 1128 03/23/16 0909  NA 143  < > 151* 148* 146* 142  K 3.5  < > 3.3* 3.8 4.1 4.3  CL 101  < > 112* 113* 112* 109  CO2 28  < > 27 23 23 22   GLUCOSE 161*  < > 215* 223* 254* 260*  BUN 67*  < > 55* 40* 32* 29*  CREATININE 2.80*  < > 2.07* 1.71* 1.50* 1.24  CALCIUM 8.5*  < > 8.0* 8.4* 8.9 8.6*  MG 2.2  --  2.4  --   --   --   PHOS 4.1  --   --   --   --   --   < > = values in this interval not displayed. Liver Function Tests:  Recent Labs  06/24/15 1100 12/23/15 0939 03/18/16 0337  AST 27 14 37  ALT 17 9 20   ALKPHOS 92 66 80  BILITOT 1.1 0.4 0.6  PROT 8.3* 7.3 6.7  ALBUMIN 4.6 4.1 3.2*    Recent Labs  06/24/15 1100  LIPASE 30   No results for input(s): AMMONIA in the last 8760 hours. CBC:  Recent Labs  12/23/15 0939  03/19/16 0317 03/20/16 0803 03/22/16 1128  WBC 9.0  < > 11.3* 7.0 9.9    NEUTROABS 7.5  --   --   --  8.2*  HGB 11.8*  < > 9.4* 10.1* 12.5*  HCT 35.5*  < > 28.6* 31.2* 36.3*  MCV 95.6  < > 96.3 98.4 92.8  PLT 240.0  < > 194 219 227  < > = values in this interval not displayed. Cardiac Enzymes:  Recent Labs  03/18/16 0116 03/18/16 0724 03/18/16 1534  CKTOTAL 718*  --   --   TROPONINI 0.13* 0.12* 0.10*   BNP: Invalid input(s): POCBNP CBG:  Recent Labs  03/17/16 2331  GLUCAP 181*    Radiological Exams: Dg Chest 2 View  Result Date: 03/18/2016 CLINICAL DATA:  Acute onset of progressive lethargy. Initial encounter. EXAM: CHEST  2 VIEW COMPARISON:  Chest radiograph performed 10/19/2014 FINDINGS: The lungs are well-aerated. Patchy right-sided airspace opacification raises concern for pneumonia. There is no evidence of pleural effusion or pneumothorax. The heart is borderline enlarged. No acute osseous abnormalities are seen. IMPRESSION: 1. Patchy right-sided airspace opacification raises concern for pneumonia. 2. Borderline cardiomegaly. Electronically Signed   By: Garald Balding M.D.   On: 03/18/2016 01:16   Dg Abd 1 View  Result Date: 03/18/2016 CLINICAL DATA:  Abdominal distention.  History of colon cancer. EXAM: ABDOMEN - 1 VIEW COMPARISON:  CT abdomen pelvis 06/24/2015 FINDINGS: There is gas and stool seen within the colon. Anastomotic staple line is seen in the right lower quadrant. There is a paucity of small bowel gas. IMPRESSION: Paucity of small bowel gas without radiographic evidence of obstruction. Electronically Signed  By: Ulyses Jarred M.D.   On: 03/18/2016 05:46   Ct Head Wo Contrast  Result Date: 03/17/2016 CLINICAL DATA:  Altered mental status.  Lethargy. EXAM: CT HEAD WITHOUT CONTRAST TECHNIQUE: Contiguous axial images were obtained from the base of the skull through the vertex without intravenous contrast. COMPARISON:  06/24/2015 FINDINGS: Brain: Diffuse cerebral atrophy. Ventricular dilatation consistent with central atrophy.  Low-attenuation changes in the deep white matter consistent with small vessel ischemia. No abnormal extra-axial fluid collections. No mass effect or midline shift. Gray-white matter junctions are distinct. Basal cisterns are not effaced. No acute intracranial hemorrhage. Vascular: Vascular calcifications. Skull: Normal. Negative for fracture or focal lesion. Sinuses/Orbits: Mild mucosal thickening in the paranasal sinuses with retention cyst in the right maxillary antrum. Mastoid air cells are not opacified. Other: No significant changes since prior study. IMPRESSION: No acute intracranial abnormalities. Chronic atrophy and small vessel ischemic changes. Electronically Signed   By: Lucienne Capers M.D.   On: 03/17/2016 23:57   Dg Abd Portable 1v  Result Date: 03/19/2016 CLINICAL DATA:  Nausea and vomiting with abdominal distention. EXAM: PORTABLE ABDOMEN - 1 VIEW COMPARISON:  03/18/2016. FINDINGS: Gaseous distention of the colon is improved. Cholecystectomy clips RIGHT upper quadrant. Within limits for assessment on supine portable abdomen, no visible obstruction or free air. IMPRESSION: Negative. Electronically Signed   By: Staci Righter M.D.   On: 03/19/2016 13:43    Assessment/Plan  Physical deconditioning With generalized weakness.Will have him work with physical therapy and occupational therapy team to help with gait training and muscle strengthening exercises.fall precautions. Skin care. Encourage to be out of bed.   Community-acquired pneumonia Has completed his course of antibiotic. Breathing has been stable. Monitor clinically. Continue Mucinex twice a day for cough. Encouraged to use incentive spiral meter.  Influenza A Completed his Tamiflu. Monitor his breathing. Encouraged to use incentive spirometry meter.  Unstageable pressure ulcer 2 right hip, provide wound care for now and monitor. Pressure ulcer prophylaxis to be provided.  Coronary artery disease Chest pain-free. Continue  isosorbide mononitrate, aspirin  Chronic diastolic CHF Continue hydralazine, isosorbide mononitrate and Lasix current regimen. Check BMP.   arthritis On chronic prednisone. With recent worsening of inflammation was on higher dose of prednisone. Continue prednisone 10 mg daily for now. Continue Percocet 5-325 milligram every 4 hours as needed and Zanaflex 4 mg every 6 hours as needed.  Hypothyroidism Continue levothyroxine 100 g daily and monitor  Type 2 diabetes mellitus with peripheral neuropathy Monitor CBC, continue sitagliptin and gabapentin  BPV Continue meclizine 12.5 mg 3 times a day as needed and monitor  CKD stage III Monitor BMP   Goals of care: short term rehabilitation   Labs/tests ordered:cbc, bmp  Family/ staff Communication: reviewed care plan with patient and nursing supervisor    Blanchie Serve, MD Internal Medicine [place], [place] [address] Cell Phone (Monday-Friday 8 am - 5 pm): [phone] On Call: [phone] and follow prompts after 5 pm and on weekends Office Phone: [phone] Office Fax: [phone]

## 2016-03-29 LAB — CBC AND DIFFERENTIAL
HEMATOCRIT: 36 % — AB (ref 41–53)
Hemoglobin: 11.7 g/dL — AB (ref 13.5–17.5)
NEUTROS ABS: 9 /uL
Platelets: 329 10*3/uL (ref 150–399)
WBC: 10.8 10*3/mL

## 2016-03-29 LAB — BASIC METABOLIC PANEL
BUN: 32 mg/dL — AB (ref 4–21)
Creatinine: 1.6 mg/dL — AB (ref 0.6–1.3)
GLUCOSE: 138 mg/dL
Potassium: 3.5 mmol/L (ref 3.4–5.3)
Sodium: 156 mmol/L — AB (ref 137–147)

## 2016-03-30 ENCOUNTER — Encounter: Payer: Self-pay | Admitting: Adult Health

## 2016-03-30 ENCOUNTER — Non-Acute Institutional Stay (SKILLED_NURSING_FACILITY): Payer: Medicare Other | Admitting: Adult Health

## 2016-03-30 DIAGNOSIS — E870 Hyperosmolality and hypernatremia: Secondary | ICD-10-CM | POA: Diagnosis not present

## 2016-03-30 NOTE — Progress Notes (Signed)
DATE:   03/30/16  MRN:  [id number]  BIRTHDAY: 1920/10/21  Facility:  Nursing Home Location:  [place] Room Number: (P) 1203-P  LEVEL OF CARE:  SNF (31)  Contact Information    Name Relation Home Work Max Spouse [phone]  [phone]   Bernardo, Jodoin   [phone]   Edmon Crape Daughter   [phone]       Code Status History    Date Active Date Inactive Code Status Order ID Comments User Context   03/18/2016  2:38 AM 03/23/2016  8:21 PM DNR [id number]  Toy Baker, MD Inpatient   10/19/2014 10:08 PM 10/22/2014  1:45 PM DNR [id number]  Toy Baker, MD Inpatient   08/18/2014 12:44 AM 08/21/2014  7:17 PM Full Code [id number]  Ivor Costa, MD Inpatient   05/25/2013  2:40 AM 05/26/2013  1:35 PM Full Code [id number]  Theressa Millard, MD Inpatient   01/07/2011  5:05 PM 01/08/2011  3:38 PM Full Code [id number]  Pilar Plate, RN Inpatient    Questions for Most Recent Historical Code Status (Order [id number])    Question Answer Comment   In the event of cardiac or respiratory ARREST Do not call a "code blue"    In the event of cardiac or respiratory ARREST Do not perform Intubation, CPR, defibrillation or ACLS    In the event of cardiac or respiratory ARREST Use medication by any route, position, wound care, and other measures to relive pain and suffering. May use oxygen, suction and manual treatment of airway obstruction as needed for comfort.         Advance Directive Documentation   Flowsheet Row Most Recent Value  Type of Advance Directive  Out of facility DNR (pink MOST or yellow form)  Pre-existing out of facility DNR order (yellow form or pink MOST form)  No data  "MOST" Form in Place?  No data       Chief Complaint  Patient presents with  . Acute Visit    Hypernatremia    HISTORY OF PRESENT ILLNESS:  This is a 89-YO male who has Na 156, creatinine 1.57. He was seen brushing his teeth in the  bathroom sink.  He has poor oral intake. He feeds himself with some assistance.  He was admitted to [facility] on 03/23/2016 after an admission at [facility] 03/17/2016-03/23/2016 for flu and  community acquired pneumonia. He was started on IV Vancomycin and Zosyn then changed to Augmentin. He was, also, started on Tamiflu. He had hypernatremia secondary to decreased oral intake.  He was given IV fluids.   PAST MEDICAL HISTORY:  Past Medical History:  Diagnosis Date  . Anemia    NOS  . ANEMIA-NOS 06/23/2008  . ARTHRITIS 02/23/2007  . B12 deficiency 05/25/2010  . BACK PAIN, THORACIC REGION 06/04/2008  . BENIGN PROSTATIC HYPERTROPHY 10/08/2006  . BRADYCARDIA 06/10/2009  . Cancer of colon ([place]) dx'd 1998  . 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 CLOSTRIDIUM DIFFICILE COLITIS 01/30/2007  . Colitis, Clostridium difficile    presumed 11/08  . COLON CANCER, HX OF 09/20/2006  . COLONIC POLYPS, HX OF 10/08/2006  . CONSTIPATION, RECURRENT 01/06/2010  . Dementia   . DEMENTIA 05/14/2007  . DEPRESSION 02/27/2007  . Diabetes mellitus ([place])   . Diabetes mellitus type II   . DIABETES MELLITUS, TYPE II 09/20/2006  . Diastolic congestive heart failure ([place])   . DVT of lower limb, acute ([place])  01/12/2011  . Elevated PSA   . GERD 01/14/2007  . GERD (gastroesophageal reflux disease)   . Hyperlipidemia   . HYPERLIPIDEMIA 09/20/2006  . Hypertension   . HYPERTENSION 09/20/2006  . Hypothyroidism   . HYPOTHYROIDISM 01/14/2007  . Hypothyroidism   . 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 Nephrolithiasis   . NEPHROLITHIASIS, HX OF 01/14/2007  . OSA (obstructive sleep apnea)   . Osteoporosis   . OSTEOPOROSIS 10/08/2006  . PERIPHERAL EDEMA 09/03/2007  . Prostatic hypertrophy    benign  . RENAL INSUFFICIENCY 04/01/2010  . Rotator cuff syndrome    chronic  . Sleep apnea    CPAP dependent  . SLEEP APNEA, OBSTRUCTIVE 01/14/2007  . Thyroid nodule   . THYROID NODULE 01/14/2007  . Vitamin B 12 deficiency       CURRENT MEDICATIONS: Reviewed  Patient's Medications  New Prescriptions   No medications on file  Previous Medications   ALBUTEROL (PROVENTIL) (2.5 MG/3ML) 0.083% NEBULIZER SOLUTION    Take 3 mLs (2.5 mg total) by nebulization every 2 (two) hours as needed for wheezing.   ASPIRIN EC 81 MG EC TABLET    Take 1 tablet (81 mg total) by mouth daily.   BISACODYL (DULCOLAX) 10 MG SUPPOSITORY    Place 1 suppository (10 mg total) rectally daily as needed for moderate constipation.   CHOLECALCIFEROL (VITAMIN D) 1000 UNITS TABLET    Take 1,000 Units by mouth every morning.    CITALOPRAM (CELEXA) 20 MG TABLET    TAKE 1 TABLET BY MOUTH EVERY MORNING   CREON 12000 UNITS CPEP CAPSULE    TAKE 1 CAPSULE BY MOUTH THREE TIMES DAILY WITH MEALS   DEXTROSE 5 % SOLUTION    Inject 70 mLs into the vein continuous. 70 mL/H x1 liter   FAMOTIDINE (PEPCID) 20 MG TABLET    Take 1 tablet (20 mg total) by mouth daily.   FUROSEMIDE (LASIX) 80 MG TABLET    Take 0.5 tablets (40 mg total) by mouth 2 (two) times daily.   GABAPENTIN (NEURONTIN) 100 MG CAPSULE    Take 100 mg by mouth at bedtime.   HYDRALAZINE (APRESOLINE) 25 MG TABLET    Take 1 tablet (25 mg total) by mouth 3 (three) times daily.   ISOSORBIDE MONONITRATE (IMDUR) 30 MG 24 HR TABLET    TAKE 1 TABLET BY MOUTH EVERY MORNING   JANUVIA 50 MG TABLET    TAKE 1 TABLET (50 MG TOTAL) BY MOUTH EVERY MORNING.   LANSOPRAZOLE (PREVACID) 30 MG CAPSULE    TAKE 1 CAPSULE BY MOUTH EVERY MORNING   MECLIZINE (ANTIVERT) 12.5 MG TABLET    TAKE 1 TABLET BY MOUTH 3 TIMES A DAY AS NEEDED FOR DIZZINESS   MULTIPLE VITAMINS-MINERALS (DECUBI-VITE PO)    Take 1 tablet by mouth daily.   NUTRITIONAL SUPPLEMENT LIQD    Take 120 mLs by mouth 2 (two) times daily between meals. [employer]   OLANZAPINE (ZYPREXA) 5 MG TABLET    TAKE 1 TABLET BY MOUTH EVERY MORNING   ONDANSETRON (ZOFRAN) 4 MG TABLET    Take 1 tablet (4 mg total) by mouth every 8 (eight) hours as needed for nausea or vomiting.    ONE TOUCH ULTRA TEST TEST STRIP    USE TO CHECK BLOOD SUGAR ONCE A DAY AS DIRECTED   OXYCODONE-ACETAMINOPHEN (PERCOCET/ROXICET) 5-325 MG TABLET    Take by mouth every 4 (four) hours as needed for moderate pain or severe pain.   POLYETHYLENE GLYCOL (MIRALAX / GLYCOLAX) PACKET  Take 17 g by mouth daily as needed.    POTASSIUM CHLORIDE (K-DUR) 10 MEQ TABLET    Take 1 tablet (10 mEq total) by mouth daily.   PREDNISONE (DELTASONE) 10 MG TABLET    Take 10 mg by mouth daily.    QUETIAPINE (SEROQUEL) 50 MG TABLET    TAKE 1 TABLET BY MOUTH DAILY AT BEDTIME   SENNOSIDES-DOCUSATE SODIUM (SENOKOT-S) 8.6-50 MG TABLET    Take 2 tablets by mouth 2 (two) times daily as needed.    SYNTHROID 100 MCG TABLET    TAKE 1 TABLET BY MOUTH DAILY BEFORE BREAKFAST   TAMSULOSIN (FLOMAX) 0.4 MG CAPS CAPSULE    TAKE 1 CAPSULE BY MOUTH AT BEDTIME   TIZANIDINE (ZANAFLEX) 4 MG TABLET    Take 1 tablet (4 mg total) by mouth every 6 (six) hours as needed for muscle spasms.   TRAMADOL (ULTRAM) 50 MG TABLET    Take 1 tablet (50 mg total) by mouth every 8 (eight) hours as needed. For 14 days   ZINC OXIDE 20 % OINTMENT    Apply 1 application topically 3 (three) times daily.  Modified Medications   No medications on file  Discontinued Medications   GUAIFENESIN (MUCINEX) 600 MG 12 HR TABLET    Take 1 tablet (600 mg total) by mouth 2 (two) times daily.     Allergies  Allergen Reactions  . Biaxin [Clarithromycin] Other (See Comments)    Unknown   . Other Other (See Comments)    Nephrologist has told patient not to eat many different foods (tomatoes, [employer], etc).   . Sulfa Antibiotics Other (See Comments)    Unknown      REVIEW OF SYSTEMS:  GENERAL: no change in appetite, no fatigue, no weight changes, no fever, chills or weakness SKIN: Denies rash, itching, wounds, ulcer sores, or nail abnormality EYES: Denies change in vision, dry eyes, eye pain, itching or discharge EARS: Denies change in hearing, ringing in ears,  or earache NOSE: Denies nasal congestion or epistaxis MOUTH and THROAT: Denies oral discomfort, gingival pain or bleeding, pain from teeth or hoarseness   RESPIRATORY: no cough, SOB, DOE, wheezing, hemoptysis CARDIAC: no chest pain, edema or palpitations GI: no abdominal pain, diarrhea, heart burn, nausea or vomiting GU: Denies dysuria, frequency, hematuria, incontinence, or discharge PSYCHIATRIC: Denies feeling of depression or anxiety. No report of hallucinations, insomnia, paranoia, or agitation   PHYSICAL EXAMINATION  GENERAL APPEARANCE:  In no acute distress. Normal body habitus SKIN:  Skin is warm and dry.  HEAD: Normal in size and contour. No evidence of trauma EYES: Lids open and close normally. No blepharitis, entropion or ectropion. PERRL. Conjunctivae are clear and sclerae are white. Lenses are without opacity EARS: Pinnae are normal. Has bilateral hearing aides but still hard of hearing MOUTH and THROAT: Lips are without lesions. Oral mucosa is moist and without lesions. Tongue is normal in shape, size, and color and without lesions NECK: supple, trachea midline, no neck masses, no thyroid tenderness, no thyromegaly LYMPHATICS: no LAN in the neck, no supraclavicular LAN RESPIRATORY: breathing is even & unlabored, BS CTAB CARDIAC: RRR, no murmur,no extra heart sounds, no edema GI: abdomen soft, normal BS, no masses, no tenderness, no hepatomegaly, no splenomegaly EXTREMITIES:  Able to move X 4 extremities PSYCHIATRIC: Alert to person and time, disoriented to place. Affect and behavior are appropriate    LABS/RADIOLOGY: Labs reviewed: Basic Metabolic Panel:  Recent Labs  03/18/16 0337  03/20/16 0803 03/21/16 0906 03/22/16  1128 03/23/16 0909 03/29/16  NA 143  < > 151* 148* 146* 142 156*  K 3.5  < > 3.3* 3.8 4.1 4.3 3.5  CL 101  < > 112* 113* 112* 109  --   CO2 28  < > 27 23 23 22   --   GLUCOSE 161*  < > 215* 223* 254* 260*  --   BUN 67*  < > 55* 40* 32* 29* 32*   CREATININE 2.80*  < > 2.07* 1.71* 1.50* 1.24 1.6*  CALCIUM 8.5*  < > 8.0* 8.4* 8.9 8.6*  --   MG 2.2  --  2.4  --   --   --   --   PHOS 4.1  --   --   --   --   --   --   < > = values in this interval not displayed. Liver Function Tests:  Recent Labs  06/24/15 1100 12/23/15 0939 03/18/16 0337  AST 27 14 37  ALT 17 9 20   ALKPHOS 92 66 80  BILITOT 1.1 0.4 0.6  PROT 8.3* 7.3 6.7  ALBUMIN 4.6 4.1 3.2*    Recent Labs  06/24/15 1100  LIPASE 30   CBC:  Recent Labs  12/23/15 0939  03/19/16 0317 03/20/16 0803 03/22/16 1128  WBC 9.0  < > 11.3* 7.0 9.9  NEUTROABS 7.5  --   --   --  8.2*  HGB 11.8*  < > 9.4* 10.1* 12.5*  HCT 35.5*  < > 28.6* 31.2* 36.3*  MCV 95.6  < > 96.3 98.4 92.8  PLT 240.0  < > 194 219 227  < > = values in this interval not displayed. Lipid Panel:  Recent Labs  12/23/15 0939  HDL 42.60   Cardiac Enzymes:  Recent Labs  03/18/16 0116 03/18/16 0724 03/18/16 1534  CKTOTAL 718*  --   --   TROPONINI 0.13* 0.12* 0.10*   CBG:  Recent Labs  03/17/16 2331  GLUCAP 181*      Dg Chest 2 View  Result Date: 03/18/2016 CLINICAL DATA:  Acute onset of progressive lethargy. Initial encounter. EXAM: CHEST  2 VIEW COMPARISON:  Chest radiograph performed 10/19/2014 FINDINGS: The lungs are well-aerated. Patchy right-sided airspace opacification raises concern for pneumonia. There is no evidence of pleural effusion or pneumothorax. The heart is borderline enlarged. No acute osseous abnormalities are seen. IMPRESSION: 1. Patchy right-sided airspace opacification raises concern for pneumonia. 2. Borderline cardiomegaly. Electronically Signed   By: Garald Balding M.D.   On: 03/18/2016 01:16   Dg Abd 1 View  Result Date: 03/18/2016 CLINICAL DATA:  Abdominal distention.  History of colon cancer. EXAM: ABDOMEN - 1 VIEW COMPARISON:  CT abdomen pelvis 06/24/2015 FINDINGS: There is gas and stool seen within the colon. Anastomotic staple line is seen in the right  lower quadrant. There is a paucity of small bowel gas. IMPRESSION: Paucity of small bowel gas without radiographic evidence of obstruction. Electronically Signed   By: Ulyses Jarred M.D.   On: 03/18/2016 05:46   Ct Head Wo Contrast  Result Date: 03/17/2016 CLINICAL DATA:  Altered mental status.  Lethargy. EXAM: CT HEAD WITHOUT CONTRAST TECHNIQUE: Contiguous axial images were obtained from the base of the skull through the vertex without intravenous contrast. COMPARISON:  06/24/2015 FINDINGS: Brain: Diffuse cerebral atrophy. Ventricular dilatation consistent with central atrophy. Low-attenuation changes in the deep white matter consistent with small vessel ischemia. No abnormal extra-axial fluid collections. No mass effect or midline shift. Gray-white matter junctions  are distinct. Basal cisterns are not effaced. No acute intracranial hemorrhage. Vascular: Vascular calcifications. Skull: Normal. Negative for fracture or focal lesion. Sinuses/Orbits: Mild mucosal thickening in the paranasal sinuses with retention cyst in the right maxillary antrum. Mastoid air cells are not opacified. Other: No significant changes since prior study. IMPRESSION: No acute intracranial abnormalities. Chronic atrophy and small vessel ischemic changes. Electronically Signed   By: Lucienne Capers M.D.   On: 03/17/2016 23:57   Dg Abd Portable 1v  Result Date: 03/19/2016 CLINICAL DATA:  Nausea and vomiting with abdominal distention. EXAM: PORTABLE ABDOMEN - 1 VIEW COMPARISON:  03/18/2016. FINDINGS: Gaseous distention of the colon is improved. Cholecystectomy clips RIGHT upper quadrant. Within limits for assessment on supine portable abdomen, no visible obstruction or free air. IMPRESSION: Negative. Electronically Signed   By: Staci Righter M.D.   On: 03/19/2016 13:43   Dg Swallowing Func-speech Pathology  Result Date: 03/31/2016 Please refer to "Notes" tab for Speech Pathology notes.   ASSESSMENT/PLAN:  Hypernatremia - Na   156, give D5W @ 70 ml IV X 1L; BMP on 03/31/16; monitor intake; assist with meals     Monina C. Ludowici - NP  [employer] [phone]

## 2016-03-31 ENCOUNTER — Encounter (HOSPITAL_COMMUNITY): Payer: Self-pay

## 2016-03-31 ENCOUNTER — Emergency Department (HOSPITAL_COMMUNITY): Payer: Medicare Other

## 2016-03-31 ENCOUNTER — Inpatient Hospital Stay (HOSPITAL_COMMUNITY)
Admission: EM | Admit: 2016-03-31 | Discharge: 2016-04-03 | DRG: 641 | Disposition: A | Discharge status: Skilled Nursing Facility | Payer: Medicare Other | Attending: Nephrology | Admitting: Nephrology

## 2016-03-31 DIAGNOSIS — Z833 Family history of diabetes mellitus: Secondary | ICD-10-CM | POA: Diagnosis not present

## 2016-03-31 DIAGNOSIS — E440 Moderate protein-calorie malnutrition: Secondary | ICD-10-CM | POA: Diagnosis present

## 2016-03-31 DIAGNOSIS — E870 Hyperosmolality and hypernatremia: Secondary | ICD-10-CM | POA: Diagnosis present

## 2016-03-31 DIAGNOSIS — E876 Hypokalemia: Secondary | ICD-10-CM | POA: Diagnosis present

## 2016-03-31 DIAGNOSIS — R1314 Dysphagia, pharyngoesophageal phase: Secondary | ICD-10-CM | POA: Diagnosis not present

## 2016-03-31 DIAGNOSIS — D649 Anemia, unspecified: Secondary | ICD-10-CM | POA: Diagnosis present

## 2016-03-31 DIAGNOSIS — N179 Acute kidney failure, unspecified: Secondary | ICD-10-CM | POA: Diagnosis not present

## 2016-03-31 DIAGNOSIS — Z823 Family history of stroke: Secondary | ICD-10-CM | POA: Diagnosis not present

## 2016-03-31 DIAGNOSIS — G4733 Obstructive sleep apnea (adult) (pediatric): Secondary | ICD-10-CM | POA: Diagnosis present

## 2016-03-31 DIAGNOSIS — I5032 Chronic diastolic (congestive) heart failure: Secondary | ICD-10-CM | POA: Diagnosis present

## 2016-03-31 DIAGNOSIS — Z86718 Personal history of other venous thrombosis and embolism: Secondary | ICD-10-CM

## 2016-03-31 DIAGNOSIS — Z9049 Acquired absence of other specified parts of digestive tract: Secondary | ICD-10-CM | POA: Diagnosis not present

## 2016-03-31 DIAGNOSIS — Z8521 Personal history of malignant neoplasm of larynx: Secondary | ICD-10-CM | POA: Diagnosis not present

## 2016-03-31 DIAGNOSIS — Z66 Do not resuscitate: Secondary | ICD-10-CM | POA: Diagnosis present

## 2016-03-31 DIAGNOSIS — N289 Disorder of kidney and ureter, unspecified: Secondary | ICD-10-CM | POA: Diagnosis not present

## 2016-03-31 DIAGNOSIS — R4182 Altered mental status, unspecified: Secondary | ICD-10-CM | POA: Diagnosis not present

## 2016-03-31 DIAGNOSIS — I130 Hypertensive heart and chronic kidney disease with heart failure and stage 1 through stage 4 chronic kidney disease, or unspecified chronic kidney disease: Secondary | ICD-10-CM | POA: Diagnosis present

## 2016-03-31 DIAGNOSIS — Z79899 Other long term (current) drug therapy: Secondary | ICD-10-CM

## 2016-03-31 DIAGNOSIS — R030 Elevated blood-pressure reading, without diagnosis of hypertension: Secondary | ICD-10-CM | POA: Diagnosis not present

## 2016-03-31 DIAGNOSIS — F0391 Unspecified dementia with behavioral disturbance: Secondary | ICD-10-CM

## 2016-03-31 DIAGNOSIS — E871 Hypo-osmolality and hyponatremia: Secondary | ICD-10-CM | POA: Diagnosis not present

## 2016-03-31 DIAGNOSIS — M6281 Muscle weakness (generalized): Secondary | ICD-10-CM | POA: Diagnosis not present

## 2016-03-31 DIAGNOSIS — E039 Hypothyroidism, unspecified: Secondary | ICD-10-CM | POA: Diagnosis present

## 2016-03-31 DIAGNOSIS — N400 Enlarged prostate without lower urinary tract symptoms: Secondary | ICD-10-CM | POA: Diagnosis present

## 2016-03-31 DIAGNOSIS — E119 Type 2 diabetes mellitus without complications: Secondary | ICD-10-CM

## 2016-03-31 DIAGNOSIS — I10 Essential (primary) hypertension: Secondary | ICD-10-CM | POA: Diagnosis not present

## 2016-03-31 DIAGNOSIS — E1122 Type 2 diabetes mellitus with diabetic chronic kidney disease: Secondary | ICD-10-CM | POA: Diagnosis present

## 2016-03-31 DIAGNOSIS — R488 Other symbolic dysfunctions: Secondary | ICD-10-CM | POA: Diagnosis not present

## 2016-03-31 DIAGNOSIS — N183 Chronic kidney disease, stage 3 unspecified: Secondary | ICD-10-CM | POA: Diagnosis present

## 2016-03-31 DIAGNOSIS — R2689 Other abnormalities of gait and mobility: Secondary | ICD-10-CM | POA: Diagnosis not present

## 2016-03-31 DIAGNOSIS — E860 Dehydration: Secondary | ICD-10-CM | POA: Diagnosis present

## 2016-03-31 DIAGNOSIS — F0390 Unspecified dementia without behavioral disturbance: Secondary | ICD-10-CM | POA: Diagnosis present

## 2016-03-31 DIAGNOSIS — E785 Hyperlipidemia, unspecified: Secondary | ICD-10-CM | POA: Diagnosis present

## 2016-03-31 DIAGNOSIS — Z7982 Long term (current) use of aspirin: Secondary | ICD-10-CM | POA: Diagnosis not present

## 2016-03-31 DIAGNOSIS — Z6823 Body mass index (BMI) 23.0-23.9, adult: Secondary | ICD-10-CM | POA: Diagnosis not present

## 2016-03-31 DIAGNOSIS — Z85038 Personal history of other malignant neoplasm of large intestine: Secondary | ICD-10-CM | POA: Diagnosis not present

## 2016-03-31 DIAGNOSIS — I4581 Long QT syndrome: Secondary | ICD-10-CM | POA: Diagnosis present

## 2016-03-31 DIAGNOSIS — R079 Chest pain, unspecified: Secondary | ICD-10-CM | POA: Diagnosis not present

## 2016-03-31 DIAGNOSIS — Z86010 Personal history of colonic polyps: Secondary | ICD-10-CM

## 2016-03-31 LAB — URINALYSIS, ROUTINE W REFLEX MICROSCOPIC
BILIRUBIN URINE: NEGATIVE
GLUCOSE, UA: NEGATIVE mg/dL
KETONES UR: 5 mg/dL — AB
LEUKOCYTES UA: NEGATIVE
NITRITE: NEGATIVE
PROTEIN: 100 mg/dL — AB
Specific Gravity, Urine: 1.016 (ref 1.005–1.030)
pH: 5 (ref 5.0–8.0)

## 2016-03-31 LAB — CBC WITH DIFFERENTIAL/PLATELET
BASOS PCT: 0 %
Basophils Absolute: 0 10*3/uL (ref 0.0–0.1)
EOS ABS: 0 10*3/uL (ref 0.0–0.7)
Eosinophils Relative: 0 %
HCT: 41.9 % (ref 39.0–52.0)
HEMOGLOBIN: 13.3 g/dL (ref 13.0–17.0)
Lymphocytes Relative: 13 %
Lymphs Abs: 2.1 10*3/uL (ref 0.7–4.0)
MCH: 31.2 pg (ref 26.0–34.0)
MCHC: 31.7 g/dL (ref 30.0–36.0)
MCV: 98.4 fL (ref 78.0–100.0)
Monocytes Absolute: 0.6 10*3/uL (ref 0.1–1.0)
Monocytes Relative: 4 %
NEUTROS PCT: 83 %
Neutro Abs: 13.4 10*3/uL — ABNORMAL HIGH (ref 1.7–7.7)
PLATELETS: 402 10*3/uL — AB (ref 150–400)
RBC: 4.26 MIL/uL (ref 4.22–5.81)
RDW: 14.6 % (ref 11.5–15.5)
WBC: 16.2 10*3/uL — AB (ref 4.0–10.5)

## 2016-03-31 LAB — COMPREHENSIVE METABOLIC PANEL
ALBUMIN: 3.4 g/dL — AB (ref 3.5–5.0)
ALK PHOS: 106 U/L (ref 38–126)
ALT: 23 U/L (ref 17–63)
ANION GAP: 14 (ref 5–15)
AST: 25 U/L (ref 15–41)
BUN: 36 mg/dL — ABNORMAL HIGH (ref 6–20)
CALCIUM: 9.9 mg/dL (ref 8.9–10.3)
CHLORIDE: 121 mmol/L — AB (ref 101–111)
CO2: 22 mmol/L (ref 22–32)
Creatinine, Ser: 2.1 mg/dL — ABNORMAL HIGH (ref 0.61–1.24)
GFR calc non Af Amer: 26 mL/min — ABNORMAL LOW (ref >60)
GFR, EST AFRICAN AMERICAN: 30 mL/min — AB (ref >60)
GLUCOSE: 154 mg/dL — AB (ref 65–99)
Potassium: 3.8 mmol/L (ref 3.5–5.1)
SODIUM: 157 mmol/L — AB (ref 135–145)
Total Bilirubin: 0.8 mg/dL (ref 0.3–1.2)
Total Protein: 7.2 g/dL (ref 6.5–8.1)

## 2016-03-31 LAB — I-STAT CG4 LACTIC ACID, ED: Lactic Acid, Venous: 3.51 mmol/L (ref 0.5–1.9)

## 2016-03-31 LAB — INFLUENZA PANEL BY PCR (TYPE A & B)
INFLAPCR: NEGATIVE
INFLBPCR: NEGATIVE

## 2016-03-31 LAB — NA AND K (SODIUM & POTASSIUM), RAND UR
Potassium Urine: 44 mmol/L
Sodium, Ur: 30 mmol/L

## 2016-03-31 LAB — I-STAT TROPONIN, ED: Troponin i, poc: 0.12 ng/mL (ref 0.00–0.08)

## 2016-03-31 LAB — OSMOLALITY, URINE: OSMOLALITY UR: 521 mosm/kg (ref 300–900)

## 2016-03-31 MED ORDER — HEPARIN SODIUM (PORCINE) 5000 UNIT/ML IJ SOLN
5000.0000 [IU] | Freq: Three times a day (TID) | INTRAMUSCULAR | Status: DC
  Administered 2016-03-31 – 2016-04-03 (×9): 5000 [IU] via SUBCUTANEOUS
  Filled 2016-03-31 (×8): qty 1

## 2016-03-31 MED ORDER — INSULIN ASPART 100 UNIT/ML ~~LOC~~ SOLN
0.0000 [IU] | Freq: Three times a day (TID) | SUBCUTANEOUS | Status: DC
  Administered 2016-04-01 (×2): 2 [IU] via SUBCUTANEOUS
  Administered 2016-04-02: 1 [IU] via SUBCUTANEOUS
  Administered 2016-04-02: 2 [IU] via SUBCUTANEOUS
  Administered 2016-04-03: 1 [IU] via SUBCUTANEOUS
  Administered 2016-04-03: 3 [IU] via SUBCUTANEOUS

## 2016-03-31 MED ORDER — DEXTROSE 5 % IV SOLN: INTRAVENOUS | Status: DC

## 2016-03-31 MED ORDER — OXYCODONE-ACETAMINOPHEN 5-325 MG PO TABS: 1.0000 | ORAL_TABLET | ORAL | Status: DC | PRN

## 2016-03-31 MED ORDER — ASPIRIN EC 81 MG PO TBEC
81.0000 mg | DELAYED_RELEASE_TABLET | Freq: Every day | ORAL | Status: DC
  Administered 2016-04-01 – 2016-04-03 (×3): 81 mg via ORAL
  Filled 2016-03-31 (×3): qty 1

## 2016-03-31 MED ORDER — TIZANIDINE HCL 4 MG PO TABS: 4.0000 mg | ORAL_TABLET | Freq: Four times a day (QID) | ORAL | Status: DC | PRN

## 2016-03-31 MED ORDER — OLANZAPINE 5 MG PO TABS
5.0000 mg | ORAL_TABLET | Freq: Every morning | ORAL | Status: DC
  Administered 2016-04-01 – 2016-04-03 (×3): 5 mg via ORAL
  Filled 2016-03-31 (×3): qty 1

## 2016-03-31 MED ORDER — PROSIGHT PO TABS
1.0000 | ORAL_TABLET | Freq: Every day | ORAL | Status: DC
  Administered 2016-04-01 – 2016-04-03 (×3): 1 via ORAL
  Filled 2016-03-31 (×3): qty 1

## 2016-03-31 MED ORDER — PANTOPRAZOLE SODIUM 40 MG PO TBEC
40.0000 mg | DELAYED_RELEASE_TABLET | Freq: Every day | ORAL | Status: DC
  Administered 2016-03-31 – 2016-04-03 (×4): 40 mg via ORAL
  Filled 2016-03-31 (×4): qty 1

## 2016-03-31 MED ORDER — HYDRALAZINE HCL 25 MG PO TABS: 25.0000 mg | ORAL_TABLET | Freq: Once | ORAL | Status: DC

## 2016-03-31 MED ORDER — SODIUM CHLORIDE 0.45 % IV SOLN
INTRAVENOUS | Status: DC
  Administered 2016-03-31 – 2016-04-01 (×2): via INTRAVENOUS

## 2016-03-31 MED ORDER — SODIUM CHLORIDE 0.45 % IV SOLN: INTRAVENOUS | Status: DC

## 2016-03-31 MED ORDER — SENNOSIDES-DOCUSATE SODIUM 8.6-50 MG PO TABS: 2.0000 | ORAL_TABLET | Freq: Two times a day (BID) | ORAL | Status: DC | PRN

## 2016-03-31 MED ORDER — SODIUM CHLORIDE 0.9 % IV BOLUS (SEPSIS): 1000.0000 mL | Freq: Once | INTRAVENOUS | Status: DC

## 2016-03-31 MED ORDER — HYDRALAZINE HCL 20 MG/ML IJ SOLN: 10.0000 mg | Freq: Once | INTRAMUSCULAR | Status: DC

## 2016-03-31 MED ORDER — HYDRALAZINE HCL 25 MG PO TABS
25.0000 mg | ORAL_TABLET | Freq: Three times a day (TID) | ORAL | Status: DC
  Administered 2016-03-31 – 2016-04-03 (×8): 25 mg via ORAL
  Filled 2016-03-31 (×8): qty 1

## 2016-03-31 MED ORDER — CITALOPRAM HYDROBROMIDE 20 MG PO TABS
20.0000 mg | ORAL_TABLET | Freq: Every morning | ORAL | Status: DC
  Administered 2016-04-01 – 2016-04-03 (×3): 20 mg via ORAL
  Filled 2016-03-31 (×3): qty 1

## 2016-03-31 MED ORDER — LABETALOL HCL 5 MG/ML IV SOLN: 10.0000 mg | INTRAVENOUS | Status: DC | PRN

## 2016-03-31 MED ORDER — LEVOTHYROXINE SODIUM 100 MCG PO TABS
100.0000 ug | ORAL_TABLET | Freq: Every day | ORAL | Status: DC
  Administered 2016-04-01 – 2016-04-03 (×3): 100 ug via ORAL
  Filled 2016-03-31 (×3): qty 1

## 2016-03-31 MED ORDER — LABETALOL HCL 5 MG/ML IV SOLN
5.0000 mg | Freq: Once | INTRAVENOUS | Status: AC
  Administered 2016-03-31: 5 mg via INTRAVENOUS
  Filled 2016-03-31: qty 4

## 2016-03-31 MED ORDER — ISOSORBIDE MONONITRATE ER 30 MG PO TB24
30.0000 mg | ORAL_TABLET | Freq: Every morning | ORAL | Status: DC
  Administered 2016-04-01 – 2016-04-03 (×3): 30 mg via ORAL
  Filled 2016-03-31 (×3): qty 1

## 2016-03-31 MED ORDER — NUTRITIONAL SUPPLEMENT PO LIQD: 120.0000 mL | Freq: Two times a day (BID) | ORAL | Status: DC

## 2016-03-31 MED ORDER — DECUBI-VITE PO CAPS: ORAL_CAPSULE | Freq: Every day | ORAL | Status: DC

## 2016-03-31 MED ORDER — GABAPENTIN 100 MG PO CAPS
100.0000 mg | ORAL_CAPSULE | Freq: Every day | ORAL | Status: DC
  Administered 2016-03-31 – 2016-04-02 (×3): 100 mg via ORAL
  Filled 2016-03-31 (×3): qty 1

## 2016-03-31 MED ORDER — POLYETHYLENE GLYCOL 3350 17 G PO PACK: 17.0000 g | PACK | Freq: Every day | ORAL | Status: DC | PRN

## 2016-03-31 MED ORDER — SODIUM CHLORIDE 0.9 % IV BOLUS (SEPSIS)
1000.0000 mL | Freq: Once | INTRAVENOUS | Status: AC
  Administered 2016-03-31: 1000 mL via INTRAVENOUS

## 2016-03-31 MED ORDER — BISACODYL 10 MG RE SUPP: 10.0000 mg | Freq: Every day | RECTAL | Status: DC | PRN

## 2016-03-31 MED ORDER — ZINC OXIDE 20 % EX OINT
1.0000 "application " | TOPICAL_OINTMENT | Freq: Three times a day (TID) | CUTANEOUS | Status: DC
  Administered 2016-04-01 – 2016-04-02 (×4): 1 via TOPICAL
  Filled 2016-03-31: qty 28.35

## 2016-03-31 MED ORDER — QUETIAPINE FUMARATE 50 MG PO TABS
50.0000 mg | ORAL_TABLET | Freq: Every day | ORAL | Status: DC
  Administered 2016-03-31 – 2016-04-02 (×3): 50 mg via ORAL
  Filled 2016-03-31: qty 1
  Filled 2016-03-31: qty 2
  Filled 2016-03-31: qty 1
  Filled 2016-03-31 (×2): qty 2
  Filled 2016-03-31 (×2): qty 1

## 2016-03-31 MED ORDER — TAMSULOSIN HCL 0.4 MG PO CAPS
0.4000 mg | ORAL_CAPSULE | Freq: Every day | ORAL | Status: DC
  Administered 2016-03-31 – 2016-04-02 (×3): 0.4 mg via ORAL
  Filled 2016-03-31 (×3): qty 1

## 2016-03-31 MED ORDER — ALBUTEROL SULFATE (2.5 MG/3ML) 0.083% IN NEBU: 2.5000 mg | INHALATION_SOLUTION | RESPIRATORY_TRACT | Status: DC | PRN

## 2016-03-31 MED ORDER — ENSURE ENLIVE PO LIQD
237.0000 mL | Freq: Two times a day (BID) | ORAL | Status: DC
  Administered 2016-04-01 – 2016-04-03 (×5): 237 mL via ORAL
  Filled 2016-03-31: qty 237

## 2016-03-31 MED ORDER — PANCRELIPASE (LIP-PROT-AMYL) 12000-38000 UNITS PO CPEP
12000.0000 [IU] | ORAL_CAPSULE | Freq: Three times a day (TID) | ORAL | Status: DC
  Administered 2016-04-01 – 2016-04-03 (×6): 12000 [IU] via ORAL
  Filled 2016-03-31 (×6): qty 1

## 2016-03-31 MED ORDER — MECLIZINE HCL 25 MG PO TABS: 12.5000 mg | ORAL_TABLET | Freq: Three times a day (TID) | ORAL | Status: DC | PRN

## 2016-03-31 NOTE — ED Notes (Signed)
IV team at bedside 

## 2016-03-31 NOTE — ED Notes (Signed)
Another phlebotomist at bedside attempting blood draw

## 2016-03-31 NOTE — ED Provider Notes (Signed)
[facility] DEPT Provider Note   CSN: [id number] Arrival date & time: 03/31/16  1536     History   Chief Complaint Chief Complaint  Patient presents with  . Abnormal Lab    HPI Nathan Blackwell is a 83 y.o. male.  The history is provided by the nursing home.  Pt sent here for hypernatremia.  Level 5 caveat AMS/Dementia.  Pt has no complanits, sent here for elevated Na.  Past Medical History:  Diagnosis Date  . Anemia    NOS  . ANEMIA-NOS 06/23/2008  . ARTHRITIS 02/23/2007  . B12 deficiency 05/25/2010  . BACK PAIN, THORACIC REGION 06/04/2008  . BENIGN PROSTATIC HYPERTROPHY 10/08/2006  . BRADYCARDIA 06/10/2009  . Cancer of colon ([employer]) dx'd 1998  . 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 CLOSTRIDIUM DIFFICILE COLITIS 01/30/2007  . Colitis, Clostridium difficile    presumed 11/08  . COLON CANCER, HX OF 09/20/2006  . COLONIC POLYPS, HX OF 10/08/2006  . CONSTIPATION, RECURRENT 01/06/2010  . Dementia   . DEMENTIA 05/14/2007  . DEPRESSION 02/27/2007  . Diabetes mellitus ([place])   . Diabetes mellitus type II   . DIABETES MELLITUS, TYPE II 09/20/2006  . Diastolic congestive heart failure ([place])   . DVT of lower limb, acute ([place]) 01/12/2011  . Elevated PSA   . GERD 01/14/2007  . GERD (gastroesophageal reflux disease)   . Hyperlipidemia   . HYPERLIPIDEMIA 09/20/2006  . Hypertension   . HYPERTENSION 09/20/2006  . Hypothyroidism   . HYPOTHYROIDISM 01/14/2007  . Hypothyroidism   . 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 Nephrolithiasis   . NEPHROLITHIASIS, HX OF 01/14/2007  . OSA (obstructive sleep apnea)   . Osteoporosis   . OSTEOPOROSIS 10/08/2006  . PERIPHERAL EDEMA 09/03/2007  . Prostatic hypertrophy    benign  . RENAL INSUFFICIENCY 04/01/2010  . Rotator cuff syndrome    chronic  . Sleep apnea    CPAP dependent  . SLEEP APNEA, OBSTRUCTIVE 01/14/2007  . Thyroid nodule   . THYROID NODULE 01/14/2007  . Vitamin B 12 deficiency     Patient Active Problem List   Diagnosis Date  Noted  . AKI (acute kidney injury) ([place]) 03/31/2016  . Hyperosmolality and hypernatremia 03/31/2016  . Influenza A 03/18/2016  . NSTEMI (non-ST elevated myocardial infarction) ([place]) 03/18/2016  . Pressure injury of skin 03/18/2016  . Degenerative arthritis of left knee 02/17/2016  . Left knee pain 02/05/2016  . Right ankle pain 01/18/2016  . Skin lesion of right arm 01/18/2016  . Lesion of ear 01/18/2016  . Abnormal stress test 01/18/2016  . Chest pain 01/10/2016  . Right leg pain 12/23/2015  . Thyroid activity decreased   . Malnutrition of moderate degree ([place]) 10/20/2014  . Hypertensive urgency 10/19/2014  . Elevated troponin 10/19/2014  . Gait disorder 09/26/2014  . Pneumonia, viral   . Hypokalemia 08/18/2014  . Diastolic congestive heart failure ([place])   . Weakness 08/17/2014  . Nausea vomiting and diarrhea 08/17/2014  . Multifactorial gait disorder 06/24/2014  . Dysphagia, pharyngoesophageal phase 12/18/2013  . Esophageal web 12/18/2013  . Thrush 10/30/2013  . Ringworm 10/30/2013  . Cough 08/25/2013  . Exertional chest pain 08/22/2013  . Nausea and vomiting 05/25/2013  . Unspecified constipation 05/02/2013  . Shuffling gait 12/17/2012  . Recurrent falls 09/05/2012  . Orthostatic hypotension 08/06/2012  . Epigastric abdominal tenderness 07/14/2012  . Dysphagia, unspecified(787.20) 07/14/2012  . Pain with swallowing 06/28/2011  . Preventative health care 02/12/2011  . Back pain 01/23/2011  .  Anticoagulation monitoring, INR range 2-3 01/08/2011  . CKD (chronic kidney disease), stage III 01/08/2011  . H/O Deep venous thrombosis - left femoral vein 01/06/2011  . B12 deficiency 05/25/2010  . Stage 3 chronic renal impairment associated with type 2 diabetes mellitus ([place]) 04/01/2010  . CONSTIPATION, RECURRENT 01/06/2010  . HEMATOCHEZIA 01/06/2010  . BRADYCARDIA 06/10/2009  . FATIGUE 05/18/2009  . HIP PAIN, RIGHT 04/01/2009  . TINNITUS 10/13/2008  . ANEMIA-NOS  06/23/2008  . Loss of weight 06/23/2008  . Other B-complex deficiencies 06/04/2008  . PARESTHESIA 06/04/2008  . PERIPHERAL EDEMA 09/03/2007  . OLECRANON BURSITIS, RIGHT 05/24/2007  . BURSITIS, RIGHT KNEE 05/24/2007  . Dementia 05/14/2007  . Major depression 02/27/2007  . ARTHRITIS 02/23/2007  . DIZZINESS 01/30/2007  . THYROID NODULE 01/14/2007  . Hypothyroidism 01/14/2007  . OSA (obstructive sleep apnea) 01/14/2007  . GERD 01/14/2007  . ROTATOR CUFF SYNDROME, LEFT 01/14/2007  . PSA, INCREASED 01/14/2007  . NEPHROLITHIASIS, HX OF 01/14/2007  . BENIGN PROSTATIC HYPERTROPHY 10/08/2006  . OSTEOPOROSIS 10/08/2006  . COLONIC POLYPS, HX OF 10/08/2006  . 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 Diabetes ([place]) 09/20/2006  . Hyperlipidemia 09/20/2006  . Essential hypertension 09/20/2006  . COLON CANCER, HX OF 09/20/2006    Past Surgical History:  Procedure Laterality Date  . APPENDECTOMY  1998  . CATARACT EXTRACTION, BILATERAL    . CHOLECYSTECTOMY  1999  . COLON RESECTION  1998  . ESOPHAGOGASTRODUODENOSCOPY N/A 12/30/2013   Procedure: ESOPHAGOGASTRODUODENOSCOPY (EGD);  Surgeon: Inda Castle, MD;  Location: [facility];  Service: Endoscopy;  Laterality: N/A;  . ESOPHAGOGASTRODUODENOSCOPY (EGD) WITH PROPOFOL N/A 12/19/2013   Procedure: ESOPHAGOGASTRODUODENOSCOPY (EGD) WITH PROPOFOL;  Surgeon: Inda Castle, MD;  Location: [facility];  Service: Endoscopy;  Laterality: N/A;  . inguinal herniorrhapy  1984  . NM LEXISCAN MYOVIEW LTD  09/02/2013   Normal LV function and wall motion. EF 62%; rate dependent LBBB with Lexiscan, Low Risk fixed inferior defect suggestive of diaphragmatic attenuation and not infarct with normal wall motion.  Marland Kitchen SAVORY DILATION N/A 12/19/2013   Procedure: SAVORY DILATION;  Surgeon: Inda Castle, MD;  Location: [facility];  Service: Endoscopy;  Laterality: N/A;  With Fluoroscopy  . SAVORY DILATION N/A 12/30/2013   Procedure: SAVORY DILATION;  Surgeon:  Inda Castle, MD;  Location: [facility];  Service: Endoscopy;  Laterality: N/A;  . skin cancer extraction     right ear-extensive scar  . TRANSTHORACIC ECHOCARDIOGRAM  08/03/2009   Normal LV size. Mild LVH. EF 60-65%. Gr 2 DD, mild MR.       Home Medications    Prior to Admission medications   Medication Sig Start Date End Date Taking? Authorizing Provider  albuterol (PROVENTIL) (2.5 MG/3ML) 0.083% nebulizer solution Take 3 mLs (2.5 mg total) by nebulization every 2 (two) hours as needed for wheezing. 03/23/16  Yes Hosie Poisson, MD  aspirin EC 81 MG EC tablet Take 1 tablet (81 mg total) by mouth daily. 10/21/14  Yes Robbie Lis, MD  bisacodyl (DULCOLAX) 10 MG suppository Place 1 suppository (10 mg total) rectally daily as needed for moderate constipation. 10/21/14  Yes Robbie Lis, MD  cholecalciferol (VITAMIN D) 1000 UNITS tablet Take 1,000 Units by mouth every morning.    Yes Historical Provider, MD  citalopram (CELEXA) 20 MG tablet TAKE 1 TABLET BY MOUTH EVERY MORNING 09/14/15  Yes Biagio Borg, MD  CREON 12000 units CPEP capsule TAKE 1 CAPSULE BY MOUTH THREE TIMES DAILY WITH MEALS 03/23/15  Yes  Biagio Borg, MD  famotidine (PEPCID) 20 MG tablet Take 1 tablet (20 mg total) by mouth daily. 08/20/14  Yes Robbie Lis, MD  furosemide (LASIX) 80 MG tablet Take 0.5 tablets (40 mg total) by mouth 2 (two) times daily. 03/27/16  Yes Hosie Poisson, MD  gabapentin (NEURONTIN) 100 MG capsule Take 100 mg by mouth at bedtime.   Yes Historical Provider, MD  hydrALAZINE (APRESOLINE) 25 MG tablet Take 1 tablet (25 mg total) by mouth 3 (three) times daily. 08/20/14  Yes Robbie Lis, MD  isosorbide mononitrate (IMDUR) 30 MG 24 hr tablet TAKE 1 TABLET BY MOUTH EVERY MORNING 01/17/16  Yes Biagio Borg, MD  JANUVIA 50 MG tablet TAKE 1 TABLET (50 MG TOTAL) BY MOUTH EVERY MORNING. 11/16/15  Yes Biagio Borg, MD  lansoprazole (PREVACID) 30 MG capsule TAKE 1 CAPSULE BY MOUTH EVERY MORNING 01/10/16  Yes Biagio Borg, MD  meclizine (ANTIVERT) 12.5 MG tablet TAKE 1 TABLET BY MOUTH 3 TIMES A DAY AS NEEDED FOR DIZZINESS 07/01/15  Yes Biagio Borg, MD  Multiple Vitamins-Minerals (DECUBI-VITE PO) Take 1 capsule by mouth daily.    Yes Historical Provider, MD  NUTRITIONAL SUPPLEMENT LIQD Take 120 mLs by mouth 2 (two) times daily between meals. MedPass 2.0   Yes Historical Provider, MD  OLANZapine (ZYPREXA) 5 MG tablet TAKE 1 TABLET BY MOUTH EVERY MORNING 03/07/16  Yes Biagio Borg, MD  ondansetron (ZOFRAN) 4 MG tablet Take 1 tablet (4 mg total) by mouth every 8 (eight) hours as needed for nausea or vomiting. 04/12/15  Yes Binnie Rail, MD  oxyCODONE-acetaminophen (PERCOCET/ROXICET) 5-325 MG tablet Take by mouth every 4 (four) hours as needed for moderate pain.    Yes Historical Provider, MD  polyethylene glycol (MIRALAX / GLYCOLAX) packet Take 17 g by mouth daily as needed (for constipation).    Yes Historical Provider, MD  potassium chloride (K-DUR) 10 MEQ tablet Take 1 tablet (10 mEq total) by mouth daily. 08/20/14  Yes Robbie Lis, MD  QUEtiapine (SEROQUEL) 50 MG tablet TAKE 1 TABLET BY MOUTH DAILY AT BEDTIME 09/28/15  Yes Biagio Borg, MD  sennosides-docusate sodium (SENOKOT-S) 8.6-50 MG tablet Take 2 tablets by mouth 2 (two) times daily as needed for constipation.    Yes Historical Provider, MD  SYNTHROID 100 MCG tablet TAKE 1 TABLET BY MOUTH DAILY BEFORE BREAKFAST 12/27/15  Yes Biagio Borg, MD  tamsulosin (FLOMAX) 0.4 MG CAPS capsule TAKE 1 CAPSULE BY MOUTH AT BEDTIME 12/27/15  Yes Biagio Borg, MD  tiZANidine (ZANAFLEX) 4 MG tablet Take 1 tablet (4 mg total) by mouth every 6 (six) hours as needed for muscle spasms. 12/29/14  Yes Biagio Borg, MD  traMADol (ULTRAM) 50 MG tablet Take 1 tablet (50 mg total) by mouth every 8 (eight) hours as needed. For 14 days Patient taking differently: Take 50 mg by mouth every 8 (eight) hours as needed (for pain). Call provider if still needed after 14 days (Stop date: 04/06/16)  03/23/16  Yes Tiffany L Reed, DO  zinc oxide 20 % ointment Apply 1 application topically 3 (three) times daily.   Yes Historical Provider, MD  dextrose 5 % solution Inject 70 mLs into the vein continuous. 70 mL/H x1 liter    Historical Provider, MD  ONE TOUCH ULTRA TEST test strip USE TO CHECK BLOOD SUGAR ONCE A DAY AS DIRECTED Patient not taking: Reported on 03/31/2016 01/12/15   Biagio Borg, MD  Family History Family History  Problem Relation Age of Onset  . Stroke Father   . Diabetes Mother     Social History Social History  Substance Use Topics  . Smoking status: Never Smoker  . Smokeless tobacco: Never Used  . Alcohol use No     Allergies   Biaxin [clarithromycin]; Other; and Sulfa antibiotics   Review of Systems Review of Systems  Unable to perform ROS: Dementia     Physical Exam Updated Vital Signs BP 171/75 (BP Location: Right Arm)   Pulse 65   Temp 97.7 F (36.5 C) (Oral)   Resp 18   Ht 5\' 9"  (1.753 m)   Wt 159 lb (72.1 kg)   SpO2 95%   BMI 23.48 kg/m   Physical Exam  Constitutional: He appears well-nourished.  HENT:  Head: Normocephalic.  Eyes: Conjunctivae are normal.  Cardiovascular: Normal rate and regular rhythm.   Pulmonary/Chest:  Rales, and rhonchi  Mild tachypnea  Abdominal:  Scar to abdomen  Musculoskeletal:  Several areas of ulcerations pressure of skin  Neurological: He is alert. No cranial nerve deficit.  ortiented X 1  Skin: Skin is warm and dry. He is not diaphoretic.  Psychiatric: He has a normal mood and affect. His behavior is normal.     ED Treatments / Results  Labs (all labs ordered are listed, but only abnormal results are displayed) Labs Reviewed  CBC WITH DIFFERENTIAL/PLATELET - Abnormal; Notable for the following:       Result Value   WBC 16.2 (*)    Platelets 402 (*)    Neutro Abs 13.4 (*)    All other components within normal limits  COMPREHENSIVE METABOLIC PANEL - Abnormal; Notable for the following:     Sodium 157 (*)    Chloride 121 (*)    Glucose, Bld 154 (*)    BUN 36 (*)    Creatinine, Ser 2.10 (*)    Albumin 3.4 (*)    GFR calc non Af Amer 26 (*)    GFR calc Af Amer 30 (*)    All other components within normal limits  URINALYSIS, ROUTINE W REFLEX MICROSCOPIC - Abnormal; Notable for the following:    APPearance HAZY (*)    Hgb urine dipstick SMALL (*)    Ketones, ur 5 (*)    Protein, ur 100 (*)    Bacteria, UA FEW (*)    Squamous Epithelial / LPF 0-5 (*)    All other components within normal limits  I-STAT CG4 LACTIC ACID, ED - Abnormal; Notable for the following:    Lactic Acid, Venous 3.51 (*)    All other components within normal limits  I-STAT TROPOININ, ED - Abnormal; Notable for the following:    Troponin i, poc 0.12 (*)    All other components within normal limits  URINE CULTURE  OSMOLALITY, URINE  NA AND K (SODIUM & POTASSIUM), RAND UR  INFLUENZA PANEL BY PCR (TYPE A & B)  BASIC METABOLIC PANEL  BASIC METABOLIC PANEL    EKG  EKG Interpretation  Date/Time:  Friday March 31 2016 15:43:04 EST Ventricular Rate:  81 PR Interval:    QRS Duration: 90 QT Interval:  426 QTC Calculation: 495 R Axis:   -41 Text Interpretation:  Sinus rhythm Abnormal R-wave progression, late transition LVH with secondary repolarization abnormality Borderline prolonged QT interval T wave inversion in V3 Confirmed by Gerald Leitz ([id number]) on 03/31/2016 6:54:42 PM       Radiology Dg Chest 2 View  Result Date: 03/31/2016 CLINICAL DATA:  83 y/o  M; chest pain. EXAM: CHEST  2 VIEW COMPARISON:  03/18/2016 chest radiograph FINDINGS: The heart size and mediastinal contours are within normal limits and stable. Aortic atherosclerosis with calcification. Improved aeration of the right lung. Linear opacities at left lung base probably represent scarring or minor atelectasis. No focal consolidation. No pleural effusion. Degenerative changes of the thoracic spine. IMPRESSION: Improved aeration  of the right lung. No new focal consolidation or pleural effusion. Electronically Signed   By: Kristine Garbe M.D.   On: 03/31/2016 16:44    Procedures Procedures (including critical care time)  Medications Ordered in ED Medications  labetalol (NORMODYNE,TRANDATE) injection 10-20 mg (not administered)  gabapentin (NEURONTIN) capsule 100 mg (not administered)  hydrALAZINE (APRESOLINE) tablet 25 mg (not administered)  isosorbide mononitrate (IMDUR) 24 hr tablet 30 mg (not administered)  zinc oxide 20 % ointment 1 application (not administered)  meclizine (ANTIVERT) tablet 12.5 mg (not administered)  pantoprazole (PROTONIX) EC tablet 40 mg (not administered)  lipase/protease/amylase (CREON) capsule 12,000 Units (not administered)  citalopram (CELEXA) tablet 20 mg (not administered)  bisacodyl (DULCOLAX) suppository 10 mg (not administered)  aspirin EC tablet 81 mg (not administered)  albuterol (PROVENTIL) (2.5 MG/3ML) 0.083% nebulizer solution 2.5 mg (not administered)  OLANZapine (ZYPREXA) tablet 5 mg (not administered)  polyethylene glycol (MIRALAX / GLYCOLAX) packet 17 g (not administered)  oxyCODONE-acetaminophen (PERCOCET/ROXICET) 5-325 MG per tablet 1 tablet (not administered)  tiZANidine (ZANAFLEX) tablet 4 mg (not administered)  tamsulosin (FLOMAX) capsule 0.4 mg (not administered)  levothyroxine (SYNTHROID, LEVOTHROID) tablet 100 mcg (not administered)  sennosides-docusate sodium (SENOKOT-S) 8.6-50 MG tablet 2 tablet (not administered)  QUEtiapine (SEROQUEL) tablet 50 mg (not administered)  0.45 % sodium chloride infusion (1,000 mLs Intravenous New Bag/Given 03/31/16 2158)  0.45 % sodium chloride infusion (not administered)  heparin injection 5,000 Units (not administered)  insulin aspart (novoLOG) injection 0-9 Units (not administered)  multivitamin (PROSIGHT) tablet 1 tablet (not administered)  feeding supplement (ENSURE ENLIVE) (ENSURE ENLIVE) liquid 237 mL (not  administered)  sodium chloride 0.9 % bolus 1,000 mL (0 mLs Intravenous Stopped 03/31/16 2115)  labetalol (NORMODYNE,TRANDATE) injection 5 mg (5 mg Intravenous Given 03/31/16 2158)     Initial Impression / Assessment and Plan / ED Course  I have reviewed the triage vital signs and the nursing notes.  Pertinent labs & imaging results that were available during my care of the patient were reviewed by me and considered in my medical decision making (see chart for details).     P_t is a 83 yo male here with hypernatremia.  Will work up, but apepars clinically dry. Will give fluids, get urine studies. Pt also has mild tachypnea with rhonchi, will evaluate lungs with xray.  Will likely need admission.   11:03 PM Upon further review it appears that patient was admitted for continued acquired pneumonia and discharged on January 18. He was discharged to [facility]. Apparently patient was having trouble taking by mouth. Patient has not had any symptoms per [place].  Patient's lactate is elevated however with no signs of infection unclear what do with this at this time. Patient does have white count, negative x-ray. Urine pending. Patient has normal vital signs. Doubt sepsis, concnerned about severe dehydration.       Final Clinical Impressions(s) / ED Diagnoses   Final diagnoses:  Hypernatremia    New Prescriptions Current Discharge Medication List       Azrael Maddix Julio Alm, MD 03/31/16 2303

## 2016-03-31 NOTE — ED Triage Notes (Signed)
Pt brought in by [facility] from [facility]. Pt was found to have sodium level of 156. Pt is asymptomatic. Hx of dementia and renal failure. Multiple wound sites noted at right shoulder and right hip.

## 2016-03-31 NOTE — H&P (Addendum)
History and Physical    Nathan Blackwell [id number] DOB: 1921/02/21 DOA: 03/31/2016   PCP: [name], MD Chief Complaint:  Chief Complaint  Patient presents with  . Abnormal Lab    HPI: Nathan Blackwell is a 83 y.o. male with medical history significant of dementia, HTN, DM2, CKD stage 3, OSA.  Patient was recently admitted from 1/13 to 1/18 to our service for Influenza A virus with PNA.  Patient was discharged to SNF.  At SNF he has not been eating or drinking much of anything and today on lab work he was noted to have hypernatremia to 157.  Patient is asymptomatic and has no complaints, on the other hand he has significant Dementia.  ED Course: Hyperosmolar hypernatremia, urine osm 520.  Creat up to 2.1, lactate of 3.5.  CXR shows resolution of PNA from last admit.  SBP is 220s and improves to 195 after labetalol.  Given 1L NS bolus.  Review of Systems: As per HPI otherwise 10 point review of systems negative.    Past Medical History:  Diagnosis Date  . Anemia    NOS  . ANEMIA-NOS 06/23/2008  . ARTHRITIS 02/23/2007  . B12 deficiency 05/25/2010  . BACK PAIN, THORACIC REGION 06/04/2008  . BENIGN PROSTATIC HYPERTROPHY 10/08/2006  . BRADYCARDIA 06/10/2009  . Cancer of colon ([place]) dx'd 1998  . 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 CLOSTRIDIUM DIFFICILE COLITIS 01/30/2007  . Colitis, Clostridium difficile    presumed 11/08  . COLON CANCER, HX OF 09/20/2006  . COLONIC POLYPS, HX OF 10/08/2006  . CONSTIPATION, RECURRENT 01/06/2010  . Dementia   . DEMENTIA 05/14/2007  . DEPRESSION 02/27/2007  . Diabetes mellitus ([place])   . Diabetes mellitus type II   . DIABETES MELLITUS, TYPE II 09/20/2006  . Diastolic congestive heart failure ([place])   . DVT of lower limb, acute ([place]) 01/12/2011  . Elevated PSA   . GERD 01/14/2007  . GERD (gastroesophageal reflux disease)   . Hyperlipidemia   . HYPERLIPIDEMIA 09/20/2006  . Hypertension   . HYPERTENSION 09/20/2006  . Hypothyroidism   . HYPOTHYROIDISM  01/14/2007  . Hypothyroidism   . 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 Nephrolithiasis   . NEPHROLITHIASIS, HX OF 01/14/2007  . OSA (obstructive sleep apnea)   . Osteoporosis   . OSTEOPOROSIS 10/08/2006  . PERIPHERAL EDEMA 09/03/2007  . Prostatic hypertrophy    benign  . RENAL INSUFFICIENCY 04/01/2010  . Rotator cuff syndrome    chronic  . Sleep apnea    CPAP dependent  . SLEEP APNEA, OBSTRUCTIVE 01/14/2007  . Thyroid nodule   . THYROID NODULE 01/14/2007  . Vitamin B 12 deficiency     Past Surgical History:  Procedure Laterality Date  . APPENDECTOMY  1998  . CATARACT EXTRACTION, BILATERAL    . CHOLECYSTECTOMY  1999  . COLON RESECTION  1998  . ESOPHAGOGASTRODUODENOSCOPY N/A 12/30/2013   Procedure: ESOPHAGOGASTRODUODENOSCOPY (EGD);  Surgeon: Inda Castle, MD;  Location: [facility];  Service: Endoscopy;  Laterality: N/A;  . ESOPHAGOGASTRODUODENOSCOPY (EGD) WITH PROPOFOL N/A 12/19/2013   Procedure: ESOPHAGOGASTRODUODENOSCOPY (EGD) WITH PROPOFOL;  Surgeon: Inda Castle, MD;  Location: [facility];  Service: Endoscopy;  Laterality: N/A;  . inguinal herniorrhapy  1984  . NM LEXISCAN MYOVIEW LTD  09/02/2013   Normal LV function and wall motion. EF 62%; rate dependent LBBB with Lexiscan, Low Risk fixed inferior defect suggestive of diaphragmatic attenuation and not infarct with normal wall motion.  Marland Kitchen SAVORY DILATION N/A 12/19/2013  Procedure: SAVORY DILATION;  Surgeon: Inda Castle, MD;  Location: [facility];  Service: Endoscopy;  Laterality: N/A;  With Fluoroscopy  . SAVORY DILATION N/A 12/30/2013   Procedure: SAVORY DILATION;  Surgeon: Inda Castle, MD;  Location: [facility];  Service: Endoscopy;  Laterality: N/A;  . skin cancer extraction     right ear-extensive scar  . TRANSTHORACIC ECHOCARDIOGRAM  08/03/2009   Normal LV size. Mild LVH. EF 60-65%. Gr 2 DD, mild MR.     reports that he has never smoked. He has never used smokeless tobacco. He reports that he  does not drink alcohol or use drugs.  Allergies  Allergen Reactions  . Biaxin [Clarithromycin] Other (See Comments)    Noted on MAR  . Other Other (See Comments)    Nephrologist has told patient not to eat many different foods (Tomatoes, Green Foods, etc).   . Sulfa Antibiotics Other (See Comments)    Noted on MAR    Family History  Problem Relation Age of Onset  . Stroke Father   . Diabetes Mother       Prior to Admission medications   Medication Sig Start Date End Date Taking? Authorizing Provider  albuterol (PROVENTIL) (2.5 MG/3ML) 0.083% nebulizer solution Take 3 mLs (2.5 mg total) by nebulization every 2 (two) hours as needed for wheezing. 03/23/16  Yes Hosie Poisson, MD  aspirin EC 81 MG EC tablet Take 1 tablet (81 mg total) by mouth daily. 10/21/14  Yes Robbie Lis, MD  bisacodyl (DULCOLAX) 10 MG suppository Place 1 suppository (10 mg total) rectally daily as needed for moderate constipation. 10/21/14  Yes Robbie Lis, MD  cholecalciferol (VITAMIN D) 1000 UNITS tablet Take 1,000 Units by mouth every morning.    Yes Historical Provider, MD  citalopram (CELEXA) 20 MG tablet TAKE 1 TABLET BY MOUTH EVERY MORNING 09/14/15  Yes Biagio Borg, MD  CREON 12000 units CPEP capsule TAKE 1 CAPSULE BY MOUTH THREE TIMES DAILY WITH MEALS 03/23/15  Yes Biagio Borg, MD  famotidine (PEPCID) 20 MG tablet Take 1 tablet (20 mg total) by mouth daily. 08/20/14  Yes Robbie Lis, MD  furosemide (LASIX) 80 MG tablet Take 0.5 tablets (40 mg total) by mouth 2 (two) times daily. 03/27/16  Yes Hosie Poisson, MD  gabapentin (NEURONTIN) 100 MG capsule Take 100 mg by mouth at bedtime.   Yes Historical Provider, MD  hydrALAZINE (APRESOLINE) 25 MG tablet Take 1 tablet (25 mg total) by mouth 3 (three) times daily. 08/20/14  Yes Robbie Lis, MD  isosorbide mononitrate (IMDUR) 30 MG 24 hr tablet TAKE 1 TABLET BY MOUTH EVERY MORNING 01/17/16  Yes Biagio Borg, MD  JANUVIA 50 MG tablet TAKE 1 TABLET (50 MG TOTAL) BY  MOUTH EVERY MORNING. 11/16/15  Yes Biagio Borg, MD  lansoprazole (PREVACID) 30 MG capsule TAKE 1 CAPSULE BY MOUTH EVERY MORNING 01/10/16  Yes Biagio Borg, MD  meclizine (ANTIVERT) 12.5 MG tablet TAKE 1 TABLET BY MOUTH 3 TIMES A DAY AS NEEDED FOR DIZZINESS 07/01/15  Yes Biagio Borg, MD  Multiple Vitamins-Minerals (DECUBI-VITE PO) Take 1 capsule by mouth daily.    Yes Historical Provider, MD  NUTRITIONAL SUPPLEMENT LIQD Take 120 mLs by mouth 2 (two) times daily between meals. MedPass 2.0   Yes Historical Provider, MD  OLANZapine (ZYPREXA) 5 MG tablet TAKE 1 TABLET BY MOUTH EVERY MORNING 03/07/16  Yes Biagio Borg, MD  ondansetron (ZOFRAN) 4 MG tablet Take 1 tablet (  4 mg total) by mouth every 8 (eight) hours as needed for nausea or vomiting. 04/12/15  Yes Binnie Rail, MD  oxyCODONE-acetaminophen (PERCOCET/ROXICET) 5-325 MG tablet Take by mouth every 4 (four) hours as needed for moderate pain.    Yes Historical Provider, MD  polyethylene glycol (MIRALAX / GLYCOLAX) packet Take 17 g by mouth daily as needed (for constipation).    Yes Historical Provider, MD  potassium chloride (K-DUR) 10 MEQ tablet Take 1 tablet (10 mEq total) by mouth daily. 08/20/14  Yes Robbie Lis, MD  QUEtiapine (SEROQUEL) 50 MG tablet TAKE 1 TABLET BY MOUTH DAILY AT BEDTIME 09/28/15  Yes Biagio Borg, MD  sennosides-docusate sodium (SENOKOT-S) 8.6-50 MG tablet Take 2 tablets by mouth 2 (two) times daily as needed for constipation.    Yes Historical Provider, MD  SYNTHROID 100 MCG tablet TAKE 1 TABLET BY MOUTH DAILY BEFORE BREAKFAST 12/27/15  Yes Biagio Borg, MD  tamsulosin (FLOMAX) 0.4 MG CAPS capsule TAKE 1 CAPSULE BY MOUTH AT BEDTIME 12/27/15  Yes Biagio Borg, MD  tiZANidine (ZANAFLEX) 4 MG tablet Take 1 tablet (4 mg total) by mouth every 6 (six) hours as needed for muscle spasms. 12/29/14  Yes Biagio Borg, MD  traMADol (ULTRAM) 50 MG tablet Take 1 tablet (50 mg total) by mouth every 8 (eight) hours as needed. For 14 days Patient  taking differently: Take 50 mg by mouth every 8 (eight) hours as needed (for pain). Call provider if still needed after 14 days (Stop date: 04/06/16) 03/23/16  Yes Tiffany L Reed, DO  zinc oxide 20 % ointment Apply 1 application topically 3 (three) times daily.   Yes Historical Provider, MD  dextrose 5 % solution Inject 70 mLs into the vein continuous. 70 mL/H x1 liter    Historical Provider, MD  ONE TOUCH ULTRA TEST test strip USE TO CHECK BLOOD SUGAR ONCE A DAY AS DIRECTED Patient not taking: Reported on 03/31/2016 01/12/15   Biagio Borg, MD    Physical Exam: Vitals:   03/31/16 1545 03/31/16 1615 03/31/16 1900 03/31/16 2015  BP: 193/80 195/72 (!) 210/60 (!) 220/84  Pulse: 72 75 65 72  Resp: 24 23 15 18   Temp:      TempSrc:      SpO2: 100% 100% 96% 98%  Weight:      Height:          Constitutional: NAD, calm, comfortable Eyes: PERRL, lids and conjunctivae normal ENMT: Mucous membranes are moist. Posterior pharynx clear of any exudate or lesions.Normal dentition.  Neck: normal, supple, no masses, no thyromegaly Respiratory: Rales and rhonchi, mild tachypnea Cardiovascular: Regular rate and rhythm, no murmurs / rubs / gallops. No extremity edema. 2+ pedal pulses. No carotid bruits.  Abdomen: no tenderness, no masses palpated. No hepatosplenomegaly. Bowel sounds positive.  Musculoskeletal: no clubbing / cyanosis. No joint deformity upper and lower extremities. Good ROM, no contractures. Normal muscle tone.  Skin: pressure ulcers Neurologic: CN 2-12 grossly intact. Sensation intact, DTR normal. Strength 5/5 in all 4.  Psychiatric: Demented, Oriented X1   Labs on Admission: I have personally reviewed following labs and imaging studies  CBC:  Recent Labs Lab 03/31/16 1810  WBC 16.2*  NEUTROABS 13.4*  HGB 13.3  HCT 41.9  MCV 98.4  PLT [id number]*   Basic Metabolic Panel:  Recent Labs Lab 03/29/16 03/31/16 1810  NA 156* 157*  K 3.5 3.8  CL  --  121*  CO2  --  22  GLUCOSE  --   154*  BUN 32* 36*  CREATININE 1.6* 2.10*  CALCIUM  --  9.9   GFR: Estimated Creatinine Clearance: 22 mL/min (by C-G formula based on SCr of 2.1 mg/dL (H)). Liver Function Tests:  Recent Labs Lab 03/31/16 1810  AST 25  ALT 23  ALKPHOS 106  BILITOT 0.8  PROT 7.2  ALBUMIN 3.4*   No results for input(s): LIPASE, AMYLASE in the last 168 hours. No results for input(s): AMMONIA in the last 168 hours. Coagulation Profile: No results for input(s): INR, PROTIME in the last 168 hours. Cardiac Enzymes: No results for input(s): CKTOTAL, CKMB, CKMBINDEX, TROPONINI in the last 168 hours. BNP (last 3 results) No results for input(s): PROBNP in the last 8760 hours. HbA1C: No results for input(s): HGBA1C in the last 72 hours. CBG: No results for input(s): GLUCAP in the last 168 hours. Lipid Profile: No results for input(s): CHOL, HDL, LDLCALC, TRIG, CHOLHDL, LDLDIRECT in the last 72 hours. Thyroid Function Tests: No results for input(s): TSH, T4TOTAL, FREET4, T3FREE, THYROIDAB in the last 72 hours. Anemia Panel: No results for input(s): VITAMINB12, FOLATE, FERRITIN, TIBC, IRON, RETICCTPCT in the last 72 hours. Urine analysis:    Component Value Date/Time   COLORURINE YELLOW 03/31/2016 2025   APPEARANCEUR HAZY (A) 03/31/2016 2025   LABSPEC 1.016 03/31/2016 2025   PHURINE 5.0 03/31/2016 2025   GLUCOSEU NEGATIVE 03/31/2016 2025   GLUCOSEU NEGATIVE 12/23/2015 0939   HGBUR SMALL (A) 03/31/2016 2025   BILIRUBINUR NEGATIVE 03/31/2016 2025   BILIRUBINUR neg 12/29/2014 1913   KETONESUR 5 (A) 03/31/2016 2025   PROTEINUR 100 (A) 03/31/2016 2025   UROBILINOGEN 0.2 12/23/2015 0939   NITRITE NEGATIVE 03/31/2016 2025   LEUKOCYTESUR NEGATIVE 03/31/2016 2025   Sepsis Labs: @LABRCNTIP (procalcitonin:4,lacticidven:4) )No results found for this or any previous visit (from the past 240 hour(s)).   Radiological Exams on Admission: Dg Chest 2 View  Result Date: 03/31/2016 CLINICAL DATA:  83 y/o   M; chest pain. EXAM: CHEST  2 VIEW COMPARISON:  03/18/2016 chest radiograph FINDINGS: The heart size and mediastinal contours are within normal limits and stable. Aortic atherosclerosis with calcification. Improved aeration of the right lung. Linear opacities at left lung base probably represent scarring or minor atelectasis. No focal consolidation. No pleural effusion. Degenerative changes of the thoracic spine. IMPRESSION: Improved aeration of the right lung. No new focal consolidation or pleural effusion. Electronically Signed   By: Kristine Garbe M.D.   On: 03/31/2016 16:44    EKG: Independently reviewed.  Assessment/Plan Principal Problem:   Hyperosmolality and hypernatremia Active Problems:   Diabetes (HCC)   Dementia   Essential hypertension   CKD (chronic kidney disease), stage III   AKI (acute kidney injury) ([place])    1. Hyperosmolality and hypernatremia - due to dehydration 1. IVF: half NS at 100 cc/hr 2. BMP Q6H 3. Hold lasix 2. AKI on CKD stage 3 - due to dehydration 1. IVF as above 2. Hold lasix 3. HTN - 1. Continue home meds 2. Add PRN labetalol 4. DM2 - 1. Hold januvia 2. Sensitive scale SSI AC 5. Dementia - continue home meds   DVT prophylaxis: Lovenox Code Status: DNR - paperwork at bedside Family Communication: No family in room Consults called: None Admission status: Admit to inpatient   Etta Quill DO [facility] Pager [phone] from 7PM-7AM  If 7AM-7PM, please contact the day physician for the patient [email or web address] Password [email or web address]  03/31/2016, 9:54 PM

## 2016-03-31 NOTE — ED Notes (Signed)
Patient transported to X-ray 

## 2016-03-31 NOTE — ED Notes (Signed)
Delay in fluids-no IV access. IV team consulted.

## 2016-03-31 NOTE — ED Notes (Signed)
RN attempted IV 2x unsuccessfully.  

## 2016-03-31 NOTE — ED Notes (Signed)
Phlebotomy at bedside attempting blood draw

## 2016-04-01 LAB — BASIC METABOLIC PANEL
ANION GAP: 12 (ref 5–15)
Anion gap: 11 (ref 5–15)
BUN: 35 mg/dL — ABNORMAL HIGH (ref 6–20)
BUN: 31 mg/dL — ABNORMAL HIGH (ref 6–20)
CHLORIDE: 123 mmol/L — AB (ref 101–111)
CHLORIDE: 123 mmol/L — AB (ref 101–111)
CO2: 20 mmol/L — ABNORMAL LOW (ref 22–32)
CO2: 21 mmol/L — ABNORMAL LOW (ref 22–32)
Calcium: 8.6 mg/dL — ABNORMAL LOW (ref 8.9–10.3)
Calcium: 8.5 mg/dL — ABNORMAL LOW (ref 8.9–10.3)
Creatinine, Ser: 1.83 mg/dL — ABNORMAL HIGH (ref 0.61–1.24)
Creatinine, Ser: 1.7 mg/dL — ABNORMAL HIGH (ref 0.61–1.24)
GFR calc non Af Amer: 30 mL/min — ABNORMAL LOW (ref >60)
GFR calc non Af Amer: 33 mL/min — ABNORMAL LOW (ref >60)
GFR, EST AFRICAN AMERICAN: 35 mL/min — AB (ref >60)
GFR, EST AFRICAN AMERICAN: 38 mL/min — AB (ref >60)
Glucose, Bld: 181 mg/dL — ABNORMAL HIGH (ref 65–99)
Glucose, Bld: 132 mg/dL — ABNORMAL HIGH (ref 65–99)
POTASSIUM: 3 mmol/L — AB (ref 3.5–5.1)
POTASSIUM: 3.4 mmol/L — AB (ref 3.5–5.1)
SODIUM: 155 mmol/L — AB (ref 135–145)
SODIUM: 155 mmol/L — AB (ref 135–145)

## 2016-04-01 LAB — GLUCOSE, CAPILLARY
GLUCOSE-CAPILLARY: 188 mg/dL — AB (ref 65–99)
GLUCOSE-CAPILLARY: 167 mg/dL — AB (ref 65–99)
Glucose-Capillary: 114 mg/dL — ABNORMAL HIGH (ref 65–99)
Glucose-Capillary: 178 mg/dL — ABNORMAL HIGH (ref 65–99)

## 2016-04-01 MED ORDER — POTASSIUM CHLORIDE CRYS ER 20 MEQ PO TBCR
40.0000 meq | EXTENDED_RELEASE_TABLET | Freq: Four times a day (QID) | ORAL | Status: AC
  Administered 2016-04-01 (×2): 40 meq via ORAL
  Filled 2016-04-01 (×2): qty 2

## 2016-04-01 MED ORDER — DEXTROSE 5 % IV SOLN
INTRAVENOUS | Status: DC
  Administered 2016-04-01: 1000 mL via INTRAVENOUS
  Administered 2016-04-02: 02:00:00 via INTRAVENOUS

## 2016-04-01 NOTE — Progress Notes (Addendum)
PROGRESS NOTE                                                                                                                                                                                                             Patient Demographics:    Nathan Blackwell, is a 83 y.o. male, DOB - 09-05-1920, [id number]  Admit date - 03/31/2016   Admitting Physician Etta Quill, DO  Outpatient Primary MD for the patient is [name], MD  LOS - 1   Chief Complaint  Patient presents with  . Abnormal Lab       Brief Narrative   Nathan Blackwell is a 83 y.o. male with medical history significant of dementia, HTN, DM2, CKD stage 3, OSA.  Patient was recently admitted from 1/13 to 1/18 to our service for Influenza A virus with PNA.  Patient was discharged to SNF.  At SNF he has not been eating or drinking much of anything and today on lab work he was noted to have hypernatremia to 157.  Subjective:    [name] today Is confused, cannot provide any complaints, no significant events overnight  Assessment  & Plan :    Principal Problem:   Hyperosmolality and hypernatremia Active Problems:   Diabetes (HCC)   Dementia   Essential hypertension   CKD (chronic kidney disease), stage III   AKI (acute kidney injury) (HCC)  Hypernatremia - In the setting of dehydration and volume depletion - Cont to hold Lasix - No  Improvement on half-normal saline, some mild foot pitting edema, will change to D5W  CK D stage III - Baseline creatinine 1.6-2, around baseline on admission, avoid nephrotoxic medication  Chronic diastolic heart failure - Grade 1 diastolic. Last EF of 55% seen on echo from 03/18/2016. Not in exacerbation. Stable. - Appears to be on the dry side, continue to hold diuresis.   Hypokalemia - Depleted , recheck in a.m. with magnesium  Malnutrition of moderate severity - Encourage oral intake  Diabetes mellitus - Cont  hold Januvia, continue  with sensitive insulin sliding scale  Hypertension - Continue with home medication, and when necessary labetalol  OSA - continue CPAP  Dementia - continue Zyprexa and  Celexa  BPH - conitnue Flomax   Code Status : DNR  Family Communication  : None at bedside  Disposition  Plan  : back to SNF when stable, in 1-2 days.  Consults  :  None  Procedures  : None   DVT Prophylaxis  : Heparin - SCDs   Lab Results  Component Value Date   PLT 402 (H) 03/31/2016    Antibiotics  :    Anti-infectives    None        Objective:   Vitals:   03/31/16 2216 03/31/16 2325 04/01/16 0612 04/01/16 0631  BP: 171/75 (!) 162/69 (!) 155/100 (!) 163/73  Pulse: 65 61 74   Resp: 18 18 18    Temp:  97.5 F (36.4 C) 98.4 F (36.9 C)   TempSrc:  Oral    SpO2: 95% 98% 100%   Weight:  60.9 kg (134 lb 4.2 oz)    Height:        Wt Readings from Last 3 Encounters:  03/31/16 60.9 kg (134 lb 4.2 oz)  03/30/16 68.9 kg (151 lb 14.4 oz)  03/28/16 62.9 kg (138 lb 9.6 oz)     Intake/Output Summary (Last 24 hours) at 04/01/16 1207 Last data filed at 04/01/16 [id number]  Gross per 24 hour  Intake          1905.83 ml  Output                0 ml  Net          1905.83 ml     Physical Exam  Awake, alert, answering only to his name, otherwise none communicative Supple Neck,No JVD, dry oral mucosa Symmetrical Chest wall movement, Good air movement bilaterally, CTAB RRR,No Gallops,Rubs or new Murmurs, No Parasternal Heave +ve B.Sounds, Abd Soft, No tenderness,  No rebound - guarding or rigidity. No Cyanosis, Clubbing , No new Rash or bruise , pitting edema in the foot.    Data Review:    CBC  Recent Labs Lab 03/31/16 1810  WBC 16.2*  HGB 13.3  HCT 41.9  PLT 402*  MCV 98.4  MCH 31.2  MCHC 31.7  RDW 14.6  LYMPHSABS 2.1  MONOABS 0.6  EOSABS 0.0  BASOSABS 0.0    Chemistries   Recent Labs Lab 03/29/16 03/31/16 1810 04/01/16 0029 04/01/16 0633  NA 156* 157* 155* 155*  K  3.5 3.8 3.4* 3.0*  CL  --  121* 123* 123*  CO2  --  22 20* 21*  GLUCOSE  --  154* 181* 132*  BUN 32* 36* 35* 31*  CREATININE 1.6* 2.10* 1.83* 1.70*  CALCIUM  --  9.9 8.6* 8.5*  AST  --  25  --   --   ALT  --  23  --   --   ALKPHOS  --  106  --   --   BILITOT  --  0.8  --   --    ------------------------------------------------------------------------------------------------------------------ No results for input(s): CHOL, HDL, LDLCALC, TRIG, CHOLHDL, LDLDIRECT in the last 72 hours.  Lab Results  Component Value Date   HGBA1C 7.1 (H) 03/18/2016   ------------------------------------------------------------------------------------------------------------------ No results for input(s): TSH, T4TOTAL, T3FREE, THYROIDAB in the last 72 hours.  Invalid input(s): FREET3 ------------------------------------------------------------------------------------------------------------------ No results for input(s): VITAMINB12, FOLATE, FERRITIN, TIBC, IRON, RETICCTPCT in the last 72 hours.  Coagulation profile No results for input(s): INR, PROTIME in the last 168 hours.  No results for input(s): DDIMER in the last 72 hours.  Cardiac Enzymes No results for input(s): CKMB, TROPONINI, MYOGLOBIN in the last 168 hours.  Invalid input(s): CK ------------------------------------------------------------------------------------------------------------------    Component Value  Date/Time   BNP 266.2 (H) 08/18/2014 0345    Inpatient Medications  Scheduled Meds: . aspirin EC  81 mg Oral Daily  . citalopram  20 mg Oral q morning - 10a  . feeding supplement (ENSURE ENLIVE)  237 mL Oral BID BM  . gabapentin  100 mg Oral QHS  . heparin  5,000 Units Subcutaneous Q8H  . hydrALAZINE  25 mg Oral TID  . insulin aspart  0-9 Units Subcutaneous TID WC  . isosorbide mononitrate  30 mg Oral q morning - 10a  . levothyroxine  100 mcg Oral QAC breakfast  . lipase/protease/amylase  12,000 Units Oral TID WC  .  multivitamin  1 tablet Oral Daily  . OLANZapine  5 mg Oral q morning - 10a  . pantoprazole  40 mg Oral Daily  . QUEtiapine  50 mg Oral QHS  . tamsulosin  0.4 mg Oral QHS  . zinc oxide  1 application Topical TID   Continuous Infusions: . dextrose     PRN Meds:.albuterol, bisacodyl, labetalol, meclizine, oxyCODONE-acetaminophen, polyethylene glycol, senna-docusate, tiZANidine  Micro Results No results found for this or any previous visit (from the past 240 hour(s)).  Radiology Reports Dg Chest 2 View  Result Date: 03/31/2016 CLINICAL DATA:  83 y/o  M; chest pain. EXAM: CHEST  2 VIEW COMPARISON:  03/18/2016 chest radiograph FINDINGS: The heart size and mediastinal contours are within normal limits and stable. Aortic atherosclerosis with calcification. Improved aeration of the right lung. Linear opacities at left lung base probably represent scarring or minor atelectasis. No focal consolidation. No pleural effusion. Degenerative changes of the thoracic spine. IMPRESSION: Improved aeration of the right lung. No new focal consolidation or pleural effusion. Electronically Signed   By: Kristine Garbe M.D.   On: 03/31/2016 16:44   Dg Chest 2 View  Result Date: 03/18/2016 CLINICAL DATA:  Acute onset of progressive lethargy. Initial encounter. EXAM: CHEST  2 VIEW COMPARISON:  Chest radiograph performed 10/19/2014 FINDINGS: The lungs are well-aerated. Patchy right-sided airspace opacification raises concern for pneumonia. There is no evidence of pleural effusion or pneumothorax. The heart is borderline enlarged. No acute osseous abnormalities are seen. IMPRESSION: 1. Patchy right-sided airspace opacification raises concern for pneumonia. 2. Borderline cardiomegaly. Electronically Signed   By: Garald Balding M.D.   On: 03/18/2016 01:16   Dg Abd 1 View  Result Date: 03/18/2016 CLINICAL DATA:  Abdominal distention.  History of colon cancer. EXAM: ABDOMEN - 1 VIEW COMPARISON:  CT abdomen pelvis  06/24/2015 FINDINGS: There is gas and stool seen within the colon. Anastomotic staple line is seen in the right lower quadrant. There is a paucity of small bowel gas. IMPRESSION: Paucity of small bowel gas without radiographic evidence of obstruction. Electronically Signed   By: Ulyses Jarred M.D.   On: 03/18/2016 05:46   Ct Head Wo Contrast  Result Date: 03/17/2016 CLINICAL DATA:  Altered mental status.  Lethargy. EXAM: CT HEAD WITHOUT CONTRAST TECHNIQUE: Contiguous axial images were obtained from the base of the skull through the vertex without intravenous contrast. COMPARISON:  06/24/2015 FINDINGS: Brain: Diffuse cerebral atrophy. Ventricular dilatation consistent with central atrophy. Low-attenuation changes in the deep white matter consistent with small vessel ischemia. No abnormal extra-axial fluid collections. No mass effect or midline shift. Gray-white matter junctions are distinct. Basal cisterns are not effaced. No acute intracranial hemorrhage. Vascular: Vascular calcifications. Skull: Normal. Negative for fracture or focal lesion. Sinuses/Orbits: Mild mucosal thickening in the paranasal sinuses with retention cyst in the right maxillary  antrum. Mastoid air cells are not opacified. Other: No significant changes since prior study. IMPRESSION: No acute intracranial abnormalities. Chronic atrophy and small vessel ischemic changes. Electronically Signed   By: Lucienne Capers M.D.   On: 03/17/2016 23:57   Dg Abd Portable 1v  Result Date: 03/19/2016 CLINICAL DATA:  Nausea and vomiting with abdominal distention. EXAM: PORTABLE ABDOMEN - 1 VIEW COMPARISON:  03/18/2016. FINDINGS: Gaseous distention of the colon is improved. Cholecystectomy clips RIGHT upper quadrant. Within limits for assessment on supine portable abdomen, no visible obstruction or free air. IMPRESSION: Negative. Electronically Signed   By: Staci Righter M.D.   On: 03/19/2016 13:43   Dg Swallowing Func-speech Pathology  Result Date:  03/31/2016 Please refer to "Notes" tab for Speech Pathology notes.    [facility], Aviyah Swetz M.D on 04/01/2016 at 12:07 PM  Between 7am to 7pm - Pager - [phone]  After 7pm go to [email or web address] - password [facility]  [facility] -  Office  [phone]

## 2016-04-01 NOTE — Progress Notes (Signed)
Patient refuses CPAP at this time.  

## 2016-04-02 LAB — VITAMIN B12: Vitamin B-12: 830 pg/mL (ref 180–914)

## 2016-04-02 LAB — BASIC METABOLIC PANEL
ANION GAP: 8 (ref 5–15)
BUN: 33 mg/dL — ABNORMAL HIGH (ref 6–20)
CALCIUM: 8.1 mg/dL — AB (ref 8.9–10.3)
CO2: 18 mmol/L — ABNORMAL LOW (ref 22–32)
Chloride: 117 mmol/L — ABNORMAL HIGH (ref 101–111)
Creatinine, Ser: 1.88 mg/dL — ABNORMAL HIGH (ref 0.61–1.24)
GFR, EST AFRICAN AMERICAN: 34 mL/min — AB (ref >60)
GFR, EST NON AFRICAN AMERICAN: 29 mL/min — AB (ref >60)
Glucose, Bld: 184 mg/dL — ABNORMAL HIGH (ref 65–99)
Potassium: 3.8 mmol/L (ref 3.5–5.1)
SODIUM: 143 mmol/L (ref 135–145)

## 2016-04-02 LAB — URINE CULTURE

## 2016-04-02 LAB — GLUCOSE, CAPILLARY
GLUCOSE-CAPILLARY: 162 mg/dL — AB (ref 65–99)
GLUCOSE-CAPILLARY: 153 mg/dL — AB (ref 65–99)
GLUCOSE-CAPILLARY: 139 mg/dL — AB (ref 65–99)
Glucose-Capillary: 118 mg/dL — ABNORMAL HIGH (ref 65–99)

## 2016-04-02 LAB — CBC
HCT: 27.9 % — ABNORMAL LOW (ref 39.0–52.0)
Hemoglobin: 9 g/dL — ABNORMAL LOW (ref 13.0–17.0)
MCH: 31.5 pg (ref 26.0–34.0)
MCHC: 32.3 g/dL (ref 30.0–36.0)
MCV: 97.6 fL (ref 78.0–100.0)
PLATELETS: 240 10*3/uL (ref 150–400)
RBC: 2.86 MIL/uL — AB (ref 4.22–5.81)
RDW: 15.4 % (ref 11.5–15.5)
WBC: 10 10*3/uL (ref 4.0–10.5)

## 2016-04-02 LAB — RETICULOCYTES
RBC.: 3.06 MIL/uL — AB (ref 4.22–5.81)
RETIC CT PCT: 1.2 % (ref 0.4–3.1)
Retic Count, Absolute: 36.7 10*3/uL (ref 19.0–186.0)

## 2016-04-02 LAB — HEMOGLOBIN: HEMOGLOBIN: 8.6 g/dL — AB (ref 13.0–17.0)

## 2016-04-02 LAB — FOLATE: Folate: 14.6 ng/mL (ref >5.9)

## 2016-04-02 LAB — FERRITIN: FERRITIN: 228 ng/mL (ref 24–336)

## 2016-04-02 LAB — IRON AND TIBC
Iron: 65 ug/dL (ref 45–182)
Saturation Ratios: 39 % (ref 17.9–39.5)
TIBC: 167 ug/dL — AB (ref 250–450)
UIBC: 102 ug/dL

## 2016-04-02 LAB — MAGNESIUM: MAGNESIUM: 1.9 mg/dL (ref 1.7–2.4)

## 2016-04-02 MED ORDER — SODIUM CHLORIDE 0.45 % IV SOLN
INTRAVENOUS | Status: DC
  Administered 2016-04-02: 1000 mL via INTRAVENOUS

## 2016-04-02 MED ORDER — GERHARDT'S BUTT CREAM
TOPICAL_CREAM | Freq: Four times a day (QID) | CUTANEOUS | Status: DC
  Administered 2016-04-02: 1 via TOPICAL
  Administered 2016-04-02 – 2016-04-03 (×3): via TOPICAL
  Filled 2016-04-02: qty 1

## 2016-04-02 NOTE — Consult Note (Signed)
[place] Nurse wound consult note Reason for Consult:Patient with severe IAD, intact heels, Unstageable pressure injury to the right hip Wound type:Pressure, moisture with suspected fungal overgrowth Pressure Injury POA: Yes Measurement Unstageable pressure injury tot he right hip measuring 1cm round with necrotic tissue obscuring depth. The scrotum, penis, buttocks and bilateral inguinal areas are erythematous with peeling consistent with IAD with fungal overgrowth presentation Wound bed:See above Drainage (amount, consistency, odor) Scant light yellow from right hip ulcer  Periwound: As described above Dressing procedure/placement/frequency: I will provide a mattress with low air loss feature and Nursing is to turn from side to side.  Dr. Glennis Brink cream has antimicrobial, moisture barrier and antiinflammatory features.  To augment this, suggest adding a one-time dose of Diflucan.  If you agree, please order. Local wound care to the right hip Unstageable pressure injury will include autolytic debridement. Bilateral pressure redistribution heel boots are provided as a preventive measure. [place] nursing team will not follow, but will remain available to this patient, the nursing and medical teams.  Please re-consult if needed. Thanks, Maudie Flakes, MSN, RN, [place], Arther Abbott  Pager# [phone]

## 2016-04-02 NOTE — Progress Notes (Addendum)
PROGRESS NOTE                                                                                                                                                                                                             Patient Demographics:    Nathan Blackwell, is a 83 y.o. male, DOB - Sep 11, 1920, [id number]  Admit date - 03/31/2016   Admitting Physician Etta Quill, DO  Outpatient Primary MD for the patient is [name], MD  LOS - 2   Chief Complaint  Patient presents with  . Abnormal Lab       Brief Narrative   Nathan Blackwell is a 83 y.o. male with medical history significant of dementia, HTN, DM2, CKD stage 3, OSA.  Patient was recently admitted from 1/13 to 1/18 to our service for Influenza A virus with PNA.  Patient was discharged to SNF.  At SNF he has not been eating or drinking much of anything and lab work he was noted to have hypernatremia to 157, Patient is improving on D5W.  Subjective:    [name] today Is confused, cannot provide any complaints, no significant events overnight, remains with very poor appetite.  Assessment  & Plan :    Principal Problem:   Hyperosmolality and hypernatremia Active Problems:   Diabetes ([place])   Dementia   Essential hypertension   CKD (chronic kidney disease), stage III   AKI (acute kidney injury) ([place])  Hypernatremia - In the setting of dehydration and volume depletion - Cont to hold Lasix - Improved with D5W, will discontinue today, and we'll change to half-normal saline, as she still appears to be clinically dehydrated .  CK D stage III - Baseline creatinine 1.6-2, around baseline on admission, avoid nephrotoxic medication  Chronic diastolic heart failure - Grade 1 diastolic. Last EF of 55% seen on echo from 03/18/2016. Not in exacerbation. Stable. - Appears to be on the dry side, continue to hold diuresis.   Hypokalemia - Depleted .  Anemia - Slight hemoglobin 9-10, was 13 on  admission secondary to dehydration, hemoglobin today is 9, will check anemia panel.  Malnutrition of moderate severity - Remains with very poor oral intake, but he drink ensure and fluids.  Diabetes mellitus - Cont  hold Januvia, continue with sensitive insulin sliding scale  Hypertension - Continue with home  medication, and when necessary labetalol  OSA - continue CPAP  Dementia - continue Zyprexa and  Celexa  BPH - conitnue Flomax   Code Status : DNR  Family Communication  : None at bedside  Disposition Plan  : back to SNF, hopefully in a.m. once appropriately hydrated  Consults  :  None  Procedures  : None   DVT Prophylaxis  : Heparin - SCDs   Lab Results  Component Value Date   PLT 240 04/02/2016    Antibiotics  :    Anti-infectives    None        Objective:   Vitals:   04/01/16 1801 04/01/16 2224 04/02/16 0658 04/02/16 1112  BP: (!) 104/51 (!) 119/42 (!) 135/54 (!) 142/71  Pulse: 65 (!) 56 (!) 54   Resp:  17 15   Temp:  97.8 F (36.6 C) 98.1 F (36.7 C)   TempSrc:   Oral   SpO2:  99% 98%   Weight:      Height:        Wt Readings from Last 3 Encounters:  03/31/16 60.9 kg (134 lb 4.2 oz)  03/30/16 68.9 kg (151 lb 14.4 oz)  03/28/16 62.9 kg (138 lb 9.6 oz)     Intake/Output Summary (Last 24 hours) at 04/02/16 1145 Last data filed at 04/02/16 0558  Gross per 24 hour  Intake          2213.75 ml  Output              200 ml  Net          2013.75 ml     Physical Exam  Awake, alert, answering only to his name, otherwise none communicative Supple Neck,No JVD,Oral mucosa more moist today Symmetrical Chest wall movement, Good air movement bilaterally, CTAB RRR,No Gallops,Rubs or new Murmurs, No Parasternal Heave +ve B.Sounds, Abd Soft, No tenderness,  No rebound - guarding or rigidity. No Cyanosis, Clubbing , No new Rash or bruise , pitting edema in the foot.    Data Review:    CBC  Recent Labs Lab 03/31/16 1810 04/02/16 0547  04/02/16 0844  WBC 16.2* 10.0  --   HGB 13.3 9.0* 8.6*  HCT 41.9 27.9*  --   PLT 402* 240  --   MCV 98.4 97.6  --   MCH 31.2 31.5  --   MCHC 31.7 32.3  --   RDW 14.6 15.4  --   LYMPHSABS 2.1  --   --   MONOABS 0.6  --   --   EOSABS 0.0  --   --   BASOSABS 0.0  --   --     Chemistries   Recent Labs Lab 03/29/16 03/31/16 1810 04/01/16 0029 04/01/16 0633 04/02/16 0547  NA 156* 157* 155* 155* 143  K 3.5 3.8 3.4* 3.0* 3.8  CL  --  121* 123* 123* 117*  CO2  --  22 20* 21* 18*  GLUCOSE  --  154* 181* 132* 184*  BUN 32* 36* 35* 31* 33*  CREATININE 1.6* 2.10* 1.83* 1.70* 1.88*  CALCIUM  --  9.9 8.6* 8.5* 8.1*  MG  --   --   --   --  1.9  AST  --  25  --   --   --   ALT  --  23  --   --   --   ALKPHOS  --  106  --   --   --  BILITOT  --  0.8  --   --   --    ------------------------------------------------------------------------------------------------------------------ No results for input(s): CHOL, HDL, LDLCALC, TRIG, CHOLHDL, LDLDIRECT in the last 72 hours.  Lab Results  Component Value Date   HGBA1C 7.1 (H) 03/18/2016   ------------------------------------------------------------------------------------------------------------------ No results for input(s): TSH, T4TOTAL, T3FREE, THYROIDAB in the last 72 hours.  Invalid input(s): FREET3 ------------------------------------------------------------------------------------------------------------------ No results for input(s): VITAMINB12, FOLATE, FERRITIN, TIBC, IRON, RETICCTPCT in the last 72 hours.  Coagulation profile No results for input(s): INR, PROTIME in the last 168 hours.  No results for input(s): DDIMER in the last 72 hours.  Cardiac Enzymes No results for input(s): CKMB, TROPONINI, MYOGLOBIN in the last 168 hours.  Invalid input(s): CK ------------------------------------------------------------------------------------------------------------------    Component Value Date/Time   BNP 266.2 (H)  08/18/2014 0345    Inpatient Medications  Scheduled Meds: . aspirin EC  81 mg Oral Daily  . citalopram  20 mg Oral q morning - 10a  . feeding supplement (ENSURE ENLIVE)  237 mL Oral BID BM  . gabapentin  100 mg Oral QHS  . heparin  5,000 Units Subcutaneous Q8H  . hydrALAZINE  25 mg Oral TID  . insulin aspart  0-9 Units Subcutaneous TID WC  . isosorbide mononitrate  30 mg Oral q morning - 10a  . levothyroxine  100 mcg Oral QAC breakfast  . lipase/protease/amylase  12,000 Units Oral TID WC  . multivitamin  1 tablet Oral Daily  . OLANZapine  5 mg Oral q morning - 10a  . pantoprazole  40 mg Oral Daily  . QUEtiapine  50 mg Oral QHS  . tamsulosin  0.4 mg Oral QHS  . zinc oxide  1 application Topical TID   Continuous Infusions: . sodium chloride     PRN Meds:.albuterol, bisacodyl, labetalol, meclizine, oxyCODONE-acetaminophen, polyethylene glycol, senna-docusate, tiZANidine  Micro Results Recent Results (from the past 240 hour(s))  Urine culture     Status: Abnormal   Collection Time: 03/31/16  8:25 PM  Result Value Ref Range Status   Specimen Description URINE, CATHETERIZED  Final   Special Requests NONE  Final   Culture MULTIPLE SPECIES PRESENT, SUGGEST RECOLLECTION (A)  Final   Report Status 04/02/2016 FINAL  Final    Radiology Reports Dg Chest 2 View  Result Date: 03/31/2016 CLINICAL DATA:  83 y/o  M; chest pain. EXAM: CHEST  2 VIEW COMPARISON:  03/18/2016 chest radiograph FINDINGS: The heart size and mediastinal contours are within normal limits and stable. Aortic atherosclerosis with calcification. Improved aeration of the right lung. Linear opacities at left lung base probably represent scarring or minor atelectasis. No focal consolidation. No pleural effusion. Degenerative changes of the thoracic spine. IMPRESSION: Improved aeration of the right lung. No new focal consolidation or pleural effusion. Electronically Signed   By: Kristine Garbe M.D.   On: 03/31/2016  16:44   Dg Chest 2 View  Result Date: 03/18/2016 CLINICAL DATA:  Acute onset of progressive lethargy. Initial encounter. EXAM: CHEST  2 VIEW COMPARISON:  Chest radiograph performed 10/19/2014 FINDINGS: The lungs are well-aerated. Patchy right-sided airspace opacification raises concern for pneumonia. There is no evidence of pleural effusion or pneumothorax. The heart is borderline enlarged. No acute osseous abnormalities are seen. IMPRESSION: 1. Patchy right-sided airspace opacification raises concern for pneumonia. 2. Borderline cardiomegaly. Electronically Signed   By: Garald Balding M.D.   On: 03/18/2016 01:16   Dg Abd 1 View  Result Date: 03/18/2016 CLINICAL DATA:  Abdominal distention.  History of  colon cancer. EXAM: ABDOMEN - 1 VIEW COMPARISON:  CT abdomen pelvis 06/24/2015 FINDINGS: There is gas and stool seen within the colon. Anastomotic staple line is seen in the right lower quadrant. There is a paucity of small bowel gas. IMPRESSION: Paucity of small bowel gas without radiographic evidence of obstruction. Electronically Signed   By: Ulyses Jarred M.D.   On: 03/18/2016 05:46   Ct Head Wo Contrast  Result Date: 03/17/2016 CLINICAL DATA:  Altered mental status.  Lethargy. EXAM: CT HEAD WITHOUT CONTRAST TECHNIQUE: Contiguous axial images were obtained from the base of the skull through the vertex without intravenous contrast. COMPARISON:  06/24/2015 FINDINGS: Brain: Diffuse cerebral atrophy. Ventricular dilatation consistent with central atrophy. Low-attenuation changes in the deep white matter consistent with small vessel ischemia. No abnormal extra-axial fluid collections. No mass effect or midline shift. Gray-white matter junctions are distinct. Basal cisterns are not effaced. No acute intracranial hemorrhage. Vascular: Vascular calcifications. Skull: Normal. Negative for fracture or focal lesion. Sinuses/Orbits: Mild mucosal thickening in the paranasal sinuses with retention cyst in the right  maxillary antrum. Mastoid air cells are not opacified. Other: No significant changes since prior study. IMPRESSION: No acute intracranial abnormalities. Chronic atrophy and small vessel ischemic changes. Electronically Signed   By: Lucienne Capers M.D.   On: 03/17/2016 23:57   Dg Abd Portable 1v  Result Date: 03/19/2016 CLINICAL DATA:  Nausea and vomiting with abdominal distention. EXAM: PORTABLE ABDOMEN - 1 VIEW COMPARISON:  03/18/2016. FINDINGS: Gaseous distention of the colon is improved. Cholecystectomy clips RIGHT upper quadrant. Within limits for assessment on supine portable abdomen, no visible obstruction or free air. IMPRESSION: Negative. Electronically Signed   By: Staci Righter M.D.   On: 03/19/2016 13:43   Dg Swallowing Func-speech Pathology  Result Date: 03/31/2016 Please refer to "Notes" tab for Speech Pathology notes.    [facility], Inioluwa Boulay M.D on 04/02/2016 at 11:45 AM  Between 7am to 7pm - Pager - [phone]  After 7pm go to [email or web address] - password [facility]  [facility] -  Office  [phone]

## 2016-04-02 NOTE — Progress Notes (Signed)
Pt refused C-PAP I will continue to monitor pt

## 2016-04-03 DIAGNOSIS — E11628 Type 2 diabetes mellitus with other skin complications: Secondary | ICD-10-CM | POA: Diagnosis not present

## 2016-04-03 DIAGNOSIS — F419 Anxiety disorder, unspecified: Secondary | ICD-10-CM | POA: Diagnosis not present

## 2016-04-03 DIAGNOSIS — Z85828 Personal history of other malignant neoplasm of skin: Secondary | ICD-10-CM | POA: Diagnosis not present

## 2016-04-03 DIAGNOSIS — R296 Repeated falls: Secondary | ICD-10-CM | POA: Diagnosis not present

## 2016-04-03 DIAGNOSIS — E1140 Type 2 diabetes mellitus with diabetic neuropathy, unspecified: Secondary | ICD-10-CM | POA: Diagnosis not present

## 2016-04-03 DIAGNOSIS — E11622 Type 2 diabetes mellitus with other skin ulcer: Secondary | ICD-10-CM | POA: Diagnosis not present

## 2016-04-03 DIAGNOSIS — R079 Chest pain, unspecified: Secondary | ICD-10-CM | POA: Diagnosis not present

## 2016-04-03 DIAGNOSIS — G4733 Obstructive sleep apnea (adult) (pediatric): Secondary | ICD-10-CM | POA: Diagnosis not present

## 2016-04-03 DIAGNOSIS — M6281 Muscle weakness (generalized): Secondary | ICD-10-CM | POA: Diagnosis not present

## 2016-04-03 DIAGNOSIS — K117 Disturbances of salivary secretion: Secondary | ICD-10-CM | POA: Diagnosis not present

## 2016-04-03 DIAGNOSIS — Z85038 Personal history of other malignant neoplasm of large intestine: Secondary | ICD-10-CM | POA: Diagnosis not present

## 2016-04-03 DIAGNOSIS — R4182 Altered mental status, unspecified: Secondary | ICD-10-CM | POA: Diagnosis not present

## 2016-04-03 DIAGNOSIS — I130 Hypertensive heart and chronic kidney disease with heart failure and stage 1 through stage 4 chronic kidney disease, or unspecified chronic kidney disease: Secondary | ICD-10-CM | POA: Diagnosis not present

## 2016-04-03 DIAGNOSIS — F0391 Unspecified dementia with behavioral disturbance: Secondary | ICD-10-CM | POA: Diagnosis not present

## 2016-04-03 DIAGNOSIS — R1310 Dysphagia, unspecified: Secondary | ICD-10-CM | POA: Diagnosis not present

## 2016-04-03 DIAGNOSIS — D638 Anemia in other chronic diseases classified elsewhere: Secondary | ICD-10-CM | POA: Diagnosis not present

## 2016-04-03 DIAGNOSIS — N183 Chronic kidney disease, stage 3 (moderate): Secondary | ICD-10-CM | POA: Diagnosis not present

## 2016-04-03 DIAGNOSIS — E43 Unspecified severe protein-calorie malnutrition: Secondary | ICD-10-CM | POA: Diagnosis not present

## 2016-04-03 DIAGNOSIS — Z7982 Long term (current) use of aspirin: Secondary | ICD-10-CM | POA: Diagnosis not present

## 2016-04-03 DIAGNOSIS — L89153 Pressure ulcer of sacral region, stage 3: Secondary | ICD-10-CM | POA: Diagnosis not present

## 2016-04-03 DIAGNOSIS — E039 Hypothyroidism, unspecified: Secondary | ICD-10-CM | POA: Diagnosis not present

## 2016-04-03 DIAGNOSIS — L89152 Pressure ulcer of sacral region, stage 2: Secondary | ICD-10-CM | POA: Diagnosis not present

## 2016-04-03 DIAGNOSIS — F05 Delirium due to known physiological condition: Secondary | ICD-10-CM | POA: Diagnosis not present

## 2016-04-03 DIAGNOSIS — I5032 Chronic diastolic (congestive) heart failure: Secondary | ICD-10-CM | POA: Diagnosis not present

## 2016-04-03 DIAGNOSIS — E1122 Type 2 diabetes mellitus with diabetic chronic kidney disease: Secondary | ICD-10-CM | POA: Diagnosis not present

## 2016-04-03 DIAGNOSIS — E440 Moderate protein-calorie malnutrition: Secondary | ICD-10-CM | POA: Diagnosis not present

## 2016-04-03 DIAGNOSIS — R5381 Other malaise: Secondary | ICD-10-CM | POA: Diagnosis not present

## 2016-04-03 DIAGNOSIS — R1314 Dysphagia, pharyngoesophageal phase: Secondary | ICD-10-CM | POA: Diagnosis not present

## 2016-04-03 DIAGNOSIS — R05 Cough: Secondary | ICD-10-CM | POA: Diagnosis not present

## 2016-04-03 DIAGNOSIS — L988 Other specified disorders of the skin and subcutaneous tissue: Secondary | ICD-10-CM | POA: Diagnosis not present

## 2016-04-03 DIAGNOSIS — Z7984 Long term (current) use of oral hypoglycemic drugs: Secondary | ICD-10-CM | POA: Diagnosis not present

## 2016-04-03 DIAGNOSIS — R0789 Other chest pain: Secondary | ICD-10-CM | POA: Diagnosis not present

## 2016-04-03 DIAGNOSIS — R404 Transient alteration of awareness: Secondary | ICD-10-CM | POA: Diagnosis not present

## 2016-04-03 DIAGNOSIS — E870 Hyperosmolality and hypernatremia: Principal | ICD-10-CM

## 2016-04-03 DIAGNOSIS — L89310 Pressure ulcer of right buttock, unstageable: Secondary | ICD-10-CM | POA: Diagnosis not present

## 2016-04-03 DIAGNOSIS — R627 Adult failure to thrive: Secondary | ICD-10-CM | POA: Diagnosis not present

## 2016-04-03 DIAGNOSIS — L89213 Pressure ulcer of right hip, stage 3: Secondary | ICD-10-CM | POA: Diagnosis not present

## 2016-04-03 DIAGNOSIS — E876 Hypokalemia: Secondary | ICD-10-CM | POA: Diagnosis not present

## 2016-04-03 DIAGNOSIS — D72829 Elevated white blood cell count, unspecified: Secondary | ICD-10-CM | POA: Diagnosis not present

## 2016-04-03 DIAGNOSIS — I5030 Unspecified diastolic (congestive) heart failure: Secondary | ICD-10-CM | POA: Diagnosis not present

## 2016-04-03 DIAGNOSIS — I10 Essential (primary) hypertension: Secondary | ICD-10-CM | POA: Diagnosis not present

## 2016-04-03 DIAGNOSIS — E119 Type 2 diabetes mellitus without complications: Secondary | ICD-10-CM | POA: Diagnosis not present

## 2016-04-03 DIAGNOSIS — R2689 Other abnormalities of gait and mobility: Secondary | ICD-10-CM | POA: Diagnosis not present

## 2016-04-03 DIAGNOSIS — R488 Other symbolic dysfunctions: Secondary | ICD-10-CM | POA: Diagnosis not present

## 2016-04-03 DIAGNOSIS — N289 Disorder of kidney and ureter, unspecified: Secondary | ICD-10-CM | POA: Diagnosis not present

## 2016-04-03 DIAGNOSIS — Z79899 Other long term (current) drug therapy: Secondary | ICD-10-CM | POA: Diagnosis not present

## 2016-04-03 DIAGNOSIS — E860 Dehydration: Secondary | ICD-10-CM | POA: Diagnosis not present

## 2016-04-03 DIAGNOSIS — R531 Weakness: Secondary | ICD-10-CM | POA: Diagnosis not present

## 2016-04-03 LAB — CBC
HEMATOCRIT: 34.9 % — AB (ref 39.0–52.0)
HEMOGLOBIN: 11.3 g/dL — AB (ref 13.0–17.0)
MCH: 31.6 pg (ref 26.0–34.0)
MCHC: 32.4 g/dL (ref 30.0–36.0)
MCV: 97.5 fL (ref 78.0–100.0)
Platelets: 214 10*3/uL (ref 150–400)
RBC: 3.58 MIL/uL — AB (ref 4.22–5.81)
RDW: 15.2 % (ref 11.5–15.5)
WBC: 9.7 10*3/uL (ref 4.0–10.5)

## 2016-04-03 LAB — GLUCOSE, CAPILLARY
GLUCOSE-CAPILLARY: 129 mg/dL — AB (ref 65–99)
Glucose-Capillary: 217 mg/dL — ABNORMAL HIGH (ref 65–99)

## 2016-04-03 LAB — BASIC METABOLIC PANEL
Anion gap: 9 (ref 5–15)
BUN: 28 mg/dL — AB (ref 6–20)
CHLORIDE: 119 mmol/L — AB (ref 101–111)
CO2: 18 mmol/L — AB (ref 22–32)
Calcium: 8.6 mg/dL — ABNORMAL LOW (ref 8.9–10.3)
Creatinine, Ser: 1.64 mg/dL — ABNORMAL HIGH (ref 0.61–1.24)
GFR calc Af Amer: 40 mL/min — ABNORMAL LOW (ref >60)
GFR calc non Af Amer: 34 mL/min — ABNORMAL LOW (ref >60)
GLUCOSE: 127 mg/dL — AB (ref 65–99)
POTASSIUM: 4.9 mmol/L (ref 3.5–5.1)
Sodium: 146 mmol/L — ABNORMAL HIGH (ref 135–145)

## 2016-04-03 MED ORDER — RESOURCE THICKENUP CLEAR PO POWD
ORAL | Status: DC | PRN
  Filled 2016-04-03: qty 125

## 2016-04-03 MED ORDER — FLUCONAZOLE 100 MG PO TABS
150.0000 mg | ORAL_TABLET | Freq: Once | ORAL | Status: AC
  Administered 2016-04-03: 150 mg via ORAL
  Filled 2016-04-03: qty 2

## 2016-04-03 NOTE — Evaluation (Signed)
Clinical/Bedside Swallow Evaluation Patient Details  Name: Nathan Blackwell MRN: [id number] Date of Birth: [birth year]  Today's Date: 04/03/2016 Time: SLP Start Time (ACUTE ONLY): 56 SLP Stop Time (ACUTE ONLY): 1150 SLP Time Calculation (min) (ACUTE ONLY): 20 min  Past Medical History:  Past Medical History:  Diagnosis Date  . Anemia    NOS  . ANEMIA-NOS 06/23/2008  . ARTHRITIS 02/23/2007  . B12 deficiency 05/25/2010  . BACK PAIN, THORACIC REGION 06/04/2008  . BENIGN PROSTATIC HYPERTROPHY 10/08/2006  . BRADYCARDIA 06/10/2009  . Cancer of colon ([place]) dx'd 1998  . 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 CLOSTRIDIUM DIFFICILE COLITIS 01/30/2007  . Colitis, Clostridium difficile    presumed 11/08  . COLON CANCER, HX OF 09/20/2006  . COLONIC POLYPS, HX OF 10/08/2006  . CONSTIPATION, RECURRENT 01/06/2010  . Dementia   . DEMENTIA 05/14/2007  . DEPRESSION 02/27/2007  . Diabetes mellitus ([place])   . Diabetes mellitus type II   . DIABETES MELLITUS, TYPE II 09/20/2006  . Diastolic congestive heart failure ([place])   . DVT of lower limb, acute ([place]) 01/12/2011  . Elevated PSA   . GERD 01/14/2007  . GERD (gastroesophageal reflux disease)   . Hyperlipidemia   . HYPERLIPIDEMIA 09/20/2006  . Hypertension   . HYPERTENSION 09/20/2006  . Hypothyroidism   . HYPOTHYROIDISM 01/14/2007  . Hypothyroidism   . 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 Nephrolithiasis   . NEPHROLITHIASIS, HX OF 01/14/2007  . OSA (obstructive sleep apnea)   . Osteoporosis   . OSTEOPOROSIS 10/08/2006  . PERIPHERAL EDEMA 09/03/2007  . Prostatic hypertrophy    benign  . RENAL INSUFFICIENCY 04/01/2010  . Rotator cuff syndrome    chronic  . Sleep apnea    CPAP dependent  . SLEEP APNEA, OBSTRUCTIVE 01/14/2007  . Thyroid nodule   . THYROID NODULE 01/14/2007  . Vitamin B 12 deficiency    Past Surgical History:  Past Surgical History:  Procedure Laterality Date  . APPENDECTOMY  1998  . CATARACT EXTRACTION, BILATERAL    .  CHOLECYSTECTOMY  1999  . COLON RESECTION  1998  . ESOPHAGOGASTRODUODENOSCOPY N/A 12/30/2013   Procedure: ESOPHAGOGASTRODUODENOSCOPY (EGD);  Surgeon: Inda Castle, MD;  Location: [facility];  Service: Endoscopy;  Laterality: N/A;  . ESOPHAGOGASTRODUODENOSCOPY (EGD) WITH PROPOFOL N/A 12/19/2013   Procedure: ESOPHAGOGASTRODUODENOSCOPY (EGD) WITH PROPOFOL;  Surgeon: Inda Castle, MD;  Location: [facility];  Service: Endoscopy;  Laterality: N/A;  . inguinal herniorrhapy  1984  . NM LEXISCAN MYOVIEW LTD  09/02/2013   Normal LV function and wall motion. EF 62%; rate dependent LBBB with Lexiscan, Low Risk fixed inferior defect suggestive of diaphragmatic attenuation and not infarct with normal wall motion.  Marland Kitchen SAVORY DILATION N/A 12/19/2013   Procedure: SAVORY DILATION;  Surgeon: Inda Castle, MD;  Location: [facility] END[facility];  Service: Endoscopy;  Laterality: N/A;  With Fluoroscopy  . SAVORY DILATION N/A 12/30/2013   Procedure: SAVORY DILATION;  Surgeon: Inda Castle, MD;  Location: [facility];  Service: Endoscopy;  Laterality: N/A;  . skin cancer extraction     right ear-extensive scar  . TRANSTHORACIC ECHOCARDIOGRAM  08/03/2009   Normal LV size. Mild LVH. EF 60-65%. Gr 2 DD, mild MR.   HPI:  83 year old male admitted 04/01/15 due to hypernatremia due to dehydration. PMH significant for dementia, vocal fold CA, HTN, DM2, CKD3, OSA, GERD. Pt recently seen by SLP at [facility], with BSE and MBS completed during that admission.    Assessment /  Plan / Recommendation Clinical Impression  Pt oral motor assessment is similar to BSE completed 2 weeks ago. Adequate oral motor strength and function, hoarse voice quality. Pt exhibits delayed cough after thin and nectar thick trials, but appears to tolerate puree, as no overt s/s aspiration were observed. MBS completed during recent admission to [facility] (03/21/16) revealed penetration of both thin and nectar thick liquids. At that time, daughter was educated  regarding high aspiration risk, and decision was made to continue po intake with upright positioning and thorough oral care. At this time, recommend puree (Dys 1) diet and nectar thick liquids given increased indication of airway compromise at bedside today. Pt continues to be at very high aspiration risk with any po intake. He is unable to follow commands for airway protection (cough, clear throat, swallow again, etc).   Recommend Palliative Care consult to establish goals of care. ST will follow briefly for education. No family present at this time to complete education. Precautions posted at [facility].    Aspiration Risk  Moderate aspiration risk    Diet Recommendation Dysphagia 1 (Puree);Nectar-thick liquid   Liquid Administration via: Cup;Straw Medication Administration: Crushed with puree Supervision: Staff to assist with self feeding;Full supervision/cueing for compensatory strategies Compensations: Minimize environmental distractions;Slow rate;Small sips/bites Postural Changes: Seated upright at 90 degrees;Remain upright for at least 30 minutes after po intake    Other  Recommendations Oral Care Recommendations: Oral care before and after PO;Staff/trained caregiver to provide oral care   Follow up Recommendations Skilled Nursing facility      Frequency and Duration min 1 x/week  2 weeks       Prognosis Prognosis for Safe Diet Advancement: Fair Barriers to Reach Goals: Cognitive deficits      Swallow Study   General Date of Onset: 03/31/16 HPI: 83 year old male admitted 04/01/15 due to hypernatremia due to dehydration. PMH significant for dementia, vocal fold CA, HTN, DM2, CKD3, OSA, GERD. Pt recently seen by SLP at [facility], with BSE and MBS completed during that admission.  Type of Study: Bedside Swallow Evaluation Previous Swallow Assessment: BSE 03/20/16, MBS 03/21/16 - Dys 2 thin. Penetration of thin and nectar thick liquids. Diet Prior to this Study: Regular;Thin  liquids Temperature Spikes Noted: No Respiratory Status: Room air History of Recent Intubation: No Behavior/Cognition: Alert;Cooperative;Requires cueing (HOH, has difficulty following directions) Oral Cavity Assessment: Dry Oral Care Completed by SLP: Yes Oral Cavity - Dentition: Adequate natural dentition;Dentures, top Vision: Functional for self-feeding Self-Feeding Abilities: Total assist Patient Positioning: Upright in bed Baseline Vocal Quality: Hoarse Volitional Cough: Cognitively unable to elicit Volitional Swallow: Unable to elicit    Oral/Motor/Sensory Function Overall Oral Motor/Sensory Function: Within functional limits   Ice Chips Ice chips: Within functional limits Presentation: Spoon   Thin Liquid Thin Liquid: Impaired Pharyngeal  Phase Impairments: Suspected delayed Swallow;Cough - Delayed    Nectar Thick Nectar Thick Liquid: Impaired Presentation: Straw Pharyngeal Phase Impairments: Suspected delayed Swallow;Cough - Delayed   Honey Thick Honey Thick Liquid: Not tested   Puree Puree: Within functional limits Presentation: Spoon Pharyngeal Phase Impairments: Suspected delayed Swallow   Solid   GO   Solid: Not tested       Hitomi Slape B. [place], [place], [place]  Shonna Chock 04/03/2016,12:04 PM

## 2016-04-03 NOTE — Progress Notes (Signed)
Wound care of right hip wound completed.

## 2016-04-03 NOTE — Discharge Summary (Signed)
Physician Discharge Summary  Nathan Blackwell [id number] DOB: 09-07-1920 DOA: 03/31/2016  PCP: [name], MD  Admit date: 03/31/2016 Discharge date: 04/03/2016  Admitted From:SNF Disposition:SNF  Recommendations for Outpatient Follow-up:  1. Follow up with PCP in 1-2 weeks 2. Please obtain BMP/CBC in one week   Home Health:SNF Equipment/Devices:no Discharge Condition:stable CODE STATUS:DNR Diet recommendation:heart healthy  Brief/Interim Summary: 83 y.o.malewith medical history significant of dementia, HTN, DM2, CKD stage 3, OSA. Patient was recently admitted from 1/13 to 1/18 to our service for Influenza A virus with PNA. Patient was discharged to SNF. At SNF he has not been eating or drinking much of anything and lab work he was noted to have hypernatremia to 157.   # Hypernatremia - In the setting of dehydration and lasix. Treated with IVF with Improvement in serum sodium level. Lasix held on discharge. Patient was encouraged to increase oral intake. Recommended to monitor labs as an outpatient.  # CK D stage III: Serum creatinine level around baseline. Monitor BMP.  # Chronic diastolic heart failure - Grade 1 diastolic. Last EF of 55% seen on echo from 03/18/2016. Not in exacerbation. Stable. - Appears to be on the dry side, continue to hold diuresis.   # Anemia - Serum iron acceptable. Hemoglobin is stable. No sign of bleeding.  #Malnutrition of moderate severity - Remains with very poor oral intake, but he drink ensure and fluids. Evaluated by swallow team recommended dysphagia diet. Outpatient referral to swallow done.  #Diabetes mellitus - Continue Januvia, monitor blood sugar level.  # Hypertension - Continue home medication. Monitor blood pressure.  OSA - continue CPAP  Dementia - continue Zyprexa and  Celexa  BPH - conitnue Flomax  Patient is clinically stable. Needs close swallow evaluation for dysphagia. Serum sodium level and creatinine  level is stable. I discussed with the social worker regarding discharge planning.  Discharge Diagnoses:  Principal Problem:   Hyperosmolality and hypernatremia Active Problems:   Diabetes ([name])   Dementia   Essential hypertension   CKD (chronic kidney disease), stage III   AKI (acute kidney injury) [facility])    Discharge Instructions  Discharge Instructions    Call MD for:  difficulty breathing, headache or visual disturbances    Complete by:  As directed    Call MD for:  extreme fatigue    Complete by:  As directed    Call MD for:  hives    Complete by:  As directed    Call MD for:  persistant dizziness or light-headedness    Complete by:  As directed    Call MD for:  persistant nausea and vomiting    Complete by:  As directed    Call MD for:  severe uncontrolled pain    Complete by:  As directed    Call MD for:  temperature >100.4    Complete by:  As directed    Diet - low sodium heart healthy    Complete by:  As directed    Dysphagia 1 (Puree);Nectar-thick liquid  Liquid Administration via: Cup;Straw Medication Administration: Crushed with puree Supervision: Staff to assist with self feeding;Full supervision/cueing for compensatory strategies Compensations: Minimize environmental distractions;Slow rate;Small sips/bites Postural Changes: Seated upright at 90 degrees;Remain upright for at least 30 minutes after po intake   Discharge instructions    Complete by:  As directed    Repeat BMP in one week.   Increase activity slowly    Complete by:  As directed  Allergies as of 04/03/2016      Reactions   Biaxin [clarithromycin] Other (See Comments)   Noted on MAR   Other Other (See Comments)   Nephrologist has told patient not to eat many different foods (Tomatoes, Green Foods, etc).    Sulfa Antibiotics Other (See Comments)   Noted on MAR      Medication List    STOP taking these medications   furosemide 80 MG tablet Commonly known as:  LASIX     TAKE these  medications   albuterol (2.5 MG/3ML) 0.083% nebulizer solution Commonly known as:  PROVENTIL Take 3 mLs (2.5 mg total) by nebulization every 2 (two) hours as needed for wheezing.   aspirin 81 MG EC tablet Take 1 tablet (81 mg total) by mouth daily.   bisacodyl 10 MG suppository Commonly known as:  DULCOLAX Place 1 suppository (10 mg total) rectally daily as needed for moderate constipation.   cholecalciferol 1000 units tablet Commonly known as:  VITAMIN D Take 1,000 Units by mouth every morning.   citalopram 20 MG tablet Commonly known as:  CELEXA TAKE 1 TABLET BY MOUTH EVERY MORNING   CREON 12000 units Cpep capsule Generic drug:  lipase/protease/amylase TAKE 1 CAPSULE BY MOUTH THREE TIMES DAILY WITH MEALS   DECUBI-VITE PO Take 1 capsule by mouth daily.   dextrose 5 % solution Inject 70 mLs into the vein continuous. 70 mL/H x1 liter   famotidine 20 MG tablet Commonly known as:  PEPCID Take 1 tablet (20 mg total) by mouth daily.   gabapentin 100 MG capsule Commonly known as:  NEURONTIN Take 100 mg by mouth at bedtime.   hydrALAZINE 25 MG tablet Commonly known as:  APRESOLINE Take 1 tablet (25 mg total) by mouth 3 (three) times daily.   isosorbide mononitrate 30 MG 24 hr tablet Commonly known as:  IMDUR TAKE 1 TABLET BY MOUTH EVERY MORNING   JANUVIA 50 MG tablet Generic drug:  sitaGLIPtin TAKE 1 TABLET (50 MG TOTAL) BY MOUTH EVERY MORNING.   lansoprazole 30 MG capsule Commonly known as:  PREVACID TAKE 1 CAPSULE BY MOUTH EVERY MORNING   meclizine 12.5 MG tablet Commonly known as:  ANTIVERT TAKE 1 TABLET BY MOUTH 3 TIMES A DAY AS NEEDED FOR DIZZINESS   NUTRITIONAL SUPPLEMENT Liqd Take 120 mLs by mouth 2 (two) times daily between meals. MedPass 2.0   OLANZapine 5 MG tablet Commonly known as:  ZYPREXA TAKE 1 TABLET BY MOUTH EVERY MORNING   ondansetron 4 MG tablet Commonly known as:  ZOFRAN Take 1 tablet (4 mg total) by mouth every 8 (eight) hours as needed  for nausea or vomiting.   ONE TOUCH ULTRA TEST test strip Generic drug:  glucose blood USE TO CHECK BLOOD SUGAR ONCE A DAY AS DIRECTED   oxyCODONE-acetaminophen 5-325 MG tablet Commonly known as:  PERCOCET/ROXICET Take by mouth every 4 (four) hours as needed for moderate pain.   polyethylene glycol packet Commonly known as:  MIRALAX / GLYCOLAX Take 17 g by mouth daily as needed (for constipation).   potassium chloride 10 MEQ tablet Commonly known as:  K-DUR Take 1 tablet (10 mEq total) by mouth daily.   QUEtiapine 50 MG tablet Commonly known as:  SEROQUEL TAKE 1 TABLET BY MOUTH DAILY AT BEDTIME   sennosides-docusate sodium 8.6-50 MG tablet Commonly known as:  SENOKOT-S Take 2 tablets by mouth 2 (two) times daily as needed for constipation.   SYNTHROID 100 MCG tablet Generic drug:  levothyroxine TAKE 1 TABLET  BY MOUTH DAILY BEFORE BREAKFAST   tamsulosin 0.4 MG Caps capsule Commonly known as:  FLOMAX TAKE 1 CAPSULE BY MOUTH AT BEDTIME   tiZANidine 4 MG tablet Commonly known as:  ZANAFLEX Take 1 tablet (4 mg total) by mouth every 6 (six) hours as needed for muscle spasms.   traMADol 50 MG tablet Commonly known as:  ULTRAM Take 1 tablet (50 mg total) by mouth every 8 (eight) hours as needed. For 14 days What changed:  reasons to take this  additional instructions   zinc oxide 20 % ointment Apply 1 application topically 3 (three) times daily.      Follow-up Information    [name], MD. Schedule an appointment as soon as possible for a visit in 1 week(s).   Specialties:  Internal Medicine, Radiology Contact information: [address] [place] [address] [phone]          Allergies  Allergen Reactions  . Biaxin [Clarithromycin] Other (See Comments)    Noted on MAR  . Other Other (See Comments)    Nephrologist has told patient not to eat many different foods (Tomatoes, Green Foods, etc).   . Sulfa Antibiotics Other (See Comments)    Noted  on MAR    Consultations: None  Procedures/Studies: None  Subjective: Patient was seen and examined at bedside. Patient is alert awake and following commands. Denied pain, nausea vomiting or abdominal pain. No chest pain or shortness of breath.   Discharge Exam: Vitals:   04/02/16 2154 04/03/16 0550  BP: (!) 124/51 (!) 150/65  Pulse: (!) 56 66  Resp: 18 20  Temp: 98.1 F (36.7 C) 97.4 F (36.3 C)   Vitals:   04/02/16 1840 04/02/16 2142 04/02/16 2154 04/03/16 0550  BP: (!) 143/54 (!) 129/54 (!) 124/51 (!) 150/65  Pulse:  (!) 56 (!) 56 66  Resp:  18 18 20   Temp:  97.6 F (36.4 C) 98.1 F (36.7 C) 97.4 F (36.3 C)  TempSrc:  Oral Oral Oral  SpO2:  98% 100% 99%  Weight:      Height:        General: Pt is alert, awake, not in acute distress Cardiovascular: RRR, S1/S2 +, no rubs, no gallops Respiratory: CTA bilaterally, no wheezing, no rhonchi Abdominal: Soft, NT, ND, bowel sounds + Extremities: no edema, no cyanosis Neurology: Alert awake and following commands.   The results of significant diagnostics from this hospitalization (including imaging, microbiology, ancillary and laboratory) are listed below for reference.     Microbiology: Recent Results (from the past 240 hour(s))  Urine culture     Status: Abnormal   Collection Time: 03/31/16  8:25 PM  Result Value Ref Range Status   Specimen Description URINE, CATHETERIZED  Final   Special Requests NONE  Final   Culture MULTIPLE SPECIES PRESENT, SUGGEST RECOLLECTION (A)  Final   Report Status 04/02/2016 FINAL  Final     Labs: BNP (last 3 results) No results for input(s): BNP in the last 8760 hours. Basic Metabolic Panel:  Recent Labs Lab 03/31/16 1810 04/01/16 0029 04/01/16 [id number] 04/02/16 0547 04/03/16 0613  NA 157* 155* 155* 143 146*  K 3.8 3.4* 3.0* 3.8 4.9  CL 121* 123* 123* 117* 119*  CO2 22 20* 21* 18* 18*  GLUCOSE 154* 181* 132* 184* 127*  BUN 36* 35* 31* 33* 28*  CREATININE 2.10* 1.83* 1.70*  1.88* 1.64*  CALCIUM 9.9 8.6* 8.5* 8.1* 8.6*  MG  --   --   --  1.9  --    Liver Function Tests:  Recent Labs Lab 03/31/16 1810  AST 25  ALT 23  ALKPHOS 106  BILITOT 0.8  PROT 7.2  ALBUMIN 3.4*   No results for input(s): LIPASE, AMYLASE in the last 168 hours. No results for input(s): AMMONIA in the last 168 hours. CBC:  Recent Labs Lab 03/31/16 1810 04/02/16 0547 04/02/16 0844 04/03/16 0613  WBC 16.2* 10.0  --  9.7  NEUTROABS 13.4*  --   --   --   HGB 13.3 9.0* 8.6* 11.3*  HCT 41.9 27.9*  --  34.9*  MCV 98.4 97.6  --  97.5  PLT 402* 240  --  214   Cardiac Enzymes: No results for input(s): CKTOTAL, CKMB, CKMBINDEX, TROPONINI in the last 168 hours. BNP: Invalid input(s): POCBNP CBG:  Recent Labs Lab 04/02/16 1200 04/02/16 1715 04/02/16 2154 04/03/16 0803 04/03/16 1201  GLUCAP 153* 139* 118* 129* 217*   D-Dimer No results for input(s): DDIMER in the last 72 hours. Hgb A1c No results for input(s): HGBA1C in the last 72 hours. Lipid Profile No results for input(s): CHOL, HDL, LDLCALC, TRIG, CHOLHDL, LDLDIRECT in the last 72 hours. Thyroid function studies No results for input(s): TSH, T4TOTAL, T3FREE, THYROIDAB in the last 72 hours.  Invalid input(s): FREET3 Anemia work up  Recent Labs  04/02/16 1227  VITAMINB12 830  FOLATE 14.6  FERRITIN 228  TIBC 167*  IRON 65  RETICCTPCT 1.2   Urinalysis    Component Value Date/Time   COLORURINE YELLOW 03/31/2016 2025   APPEARANCEUR HAZY (A) 03/31/2016 2025   LABSPEC 1.016 03/31/2016 2025   PHURINE 5.0 03/31/2016 2025   GLUCOSEU NEGATIVE 03/31/2016 2025   GLUCOSEU NEGATIVE 12/23/2015 0939   HGBUR SMALL (A) 03/31/2016 2025   BILIRUBINUR NEGATIVE 03/31/2016 2025   BILIRUBINUR neg 12/29/2014 1913   KETONESUR 5 (A) 03/31/2016 2025   PROTEINUR 100 (A) 03/31/2016 2025   UROBILINOGEN 0.2 12/23/2015 0939   NITRITE NEGATIVE 03/31/2016 2025   LEUKOCYTESUR NEGATIVE 03/31/2016 2025   Sepsis Labs Invalid  input(s): PROCALCITONIN,  WBC,  LACTICIDVEN Microbiology Recent Results (from the past 240 hour(s))  Urine culture     Status: Abnormal   Collection Time: 03/31/16  8:25 PM  Result Value Ref Range Status   Specimen Description URINE, CATHETERIZED  Final   Special Requests NONE  Final   Culture MULTIPLE SPECIES PRESENT, SUGGEST RECOLLECTION (A)  Final   Report Status 04/02/2016 FINAL  Final     Time coordinating discharge: 28 minutes  SIGNED:   Rosita Fire, MD  [facility] 04/03/2016, 12:44 PM  If 7PM-7AM, please contact night-coverage [email or web address] Password [email or web address]

## 2016-04-03 NOTE — NC FL2 (Signed)
[place] MEDICAID FL2 LEVEL OF CARE SCREENING TOOL     IDENTIFICATION  Patient Name: Nathan Blackwell Birthdate: [birth year] Sex: male Admission Date (Current Location): 03/31/2016  [facility] and [place] Number:  Herbalist and Address:  [facility], [place] [address] [place], [place] [address]      Provider Number: [id number]  Attending Physician Name and Address:  Rosita Fire, MD  Relative Name and Phone Number:       Current Level of Care: Hospital Recommended Level of Care: [place] Prior Approval Number:    Date Approved/Denied:   PASRR Number:    Discharge Plan: SNF    Current Diagnoses: Patient Active Problem List   Diagnosis Date Noted  . AKI (acute kidney injury) ([place]) 03/31/2016  . Hyperosmolality and hypernatremia 03/31/2016  . Influenza A 03/18/2016  . NSTEMI (non-ST elevated myocardial infarction) ([place]) 03/18/2016  . Pressure injury of skin 03/18/2016  . Degenerative arthritis of left knee 02/17/2016  . Left knee pain 02/05/2016  . Right ankle pain 01/18/2016  . Skin lesion of right arm 01/18/2016  . Lesion of ear 01/18/2016  . Abnormal stress test 01/18/2016  . Chest pain 01/10/2016  . Right leg pain 12/23/2015  . Thyroid activity decreased   . Malnutrition of moderate degree ([place]) 10/20/2014  . Hypertensive urgency 10/19/2014  . Elevated troponin 10/19/2014  . Gait disorder 09/26/2014  . Pneumonia, viral   . Hypokalemia 08/18/2014  . Diastolic congestive heart failure ([place])   . Weakness 08/17/2014  . Nausea vomiting and diarrhea 08/17/2014  . Multifactorial gait disorder 06/24/2014  . Dysphagia, pharyngoesophageal phase 12/18/2013  . Esophageal web 12/18/2013  . Thrush 10/30/2013  . Ringworm 10/30/2013  . Cough 08/25/2013  . Exertional chest pain 08/22/2013  . Nausea and vomiting 05/25/2013  . Unspecified constipation 05/02/2013  . Shuffling gait 12/17/2012  . Recurrent falls  09/05/2012  . Orthostatic hypotension 08/06/2012  . Epigastric abdominal tenderness 07/14/2012  . Dysphagia, unspecified(787.20) 07/14/2012  . Pain with swallowing 06/28/2011  . Preventative health care 02/12/2011  . Back pain 01/23/2011  . Anticoagulation monitoring, INR range 2-3 01/08/2011  . CKD (chronic kidney disease), stage III 01/08/2011  . H/O Deep venous thrombosis - left femoral vein 01/06/2011  . B12 deficiency 05/25/2010  . Stage 3 chronic renal impairment associated with type 2 diabetes mellitus ([place]) 04/01/2010  . CONSTIPATION, RECURRENT 01/06/2010  . HEMATOCHEZIA 01/06/2010  . BRADYCARDIA 06/10/2009  . FATIGUE 05/18/2009  . HIP PAIN, RIGHT 04/01/2009  . TINNITUS 10/13/2008  . ANEMIA-NOS 06/23/2008  . Loss of weight 06/23/2008  . Other B-complex deficiencies 06/04/2008  . PARESTHESIA 06/04/2008  . PERIPHERAL EDEMA 09/03/2007  . OLECRANON BURSITIS, RIGHT 05/24/2007  . BURSITIS, RIGHT KNEE 05/24/2007  . Dementia 05/14/2007  . Major depression 02/27/2007  . ARTHRITIS 02/23/2007  . DIZZINESS 01/30/2007  . THYROID NODULE 01/14/2007  . Hypothyroidism 01/14/2007  . OSA (obstructive sleep apnea) 01/14/2007  . GERD 01/14/2007  . ROTATOR CUFF SYNDROME, LEFT 01/14/2007  . PSA, INCREASED 01/14/2007  . NEPHROLITHIASIS, HX OF 01/14/2007  . BENIGN PROSTATIC HYPERTROPHY 10/08/2006  . OSTEOPOROSIS 10/08/2006  . COLONIC POLYPS, HX OF 10/08/2006  . 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 Diabetes ([place]) 09/20/2006  . Hyperlipidemia 09/20/2006  . Essential hypertension 09/20/2006  . COLON CANCER, HX OF 09/20/2006    Orientation RESPIRATION BLADDER Height & Weight     Self, Time, Place  Normal Incontinent Weight: 134 lb 4.2 oz (60.9 kg)  Height:  5\' 9"  (175.3 cm)  BEHAVIORAL SYMPTOMS/MOOD NEUROLOGICAL BOWEL NUTRITION STATUS      Incontinent Diet (cardiac)  AMBULATORY STATUS COMMUNICATION OF NEEDS Skin   Extensive Assist   PU Stage and Appropriate Care PU Stage 1  Dressing:  (coccyx, foam dressing) PU Stage 2 Dressing:  (buttocks, foam dressing)                   Personal Care Assistance Level of Assistance  Bathing, Dressing Bathing Assistance: Maximum assistance Feeding assistance: Independent Dressing Assistance: Maximum assistance     Functional Limitations Info             SPECIAL CARE FACTORS FREQUENCY  PT (By licensed PT), OT (By licensed OT)     PT Frequency: 5/wk OT Frequency: 5/wk            Contractures      Additional Factors Info  Code Status, Allergies, Psychotropic, Insulin Sliding Scale Code Status Info: DNR Allergies Info: Biaxin Clarithromycin, Other, Sulfa Antibiotics Psychotropic Info: seroquel, celexa Insulin Sliding Scale Info: 3/day       Current Medications (04/03/2016):  This is the current hospital active medication list Current Facility-Administered Medications  Medication Dose Route Frequency Provider Last Rate Last Dose  . 0.45 % sodium chloride infusion   Intravenous Continuous Albertine Patricia, MD 50 mL/hr at 04/02/16 1841 1,000 mL at 04/02/16 1841  . albuterol (PROVENTIL) (2.5 MG/3ML) 0.083% nebulizer solution 2.5 mg  2.5 mg Nebulization Q2H PRN Etta Quill, DO      . aspirin EC tablet 81 mg  81 mg Oral Daily Etta Quill, DO   81 mg at 04/03/16 0847  . bisacodyl (DULCOLAX) suppository 10 mg  10 mg Rectal Daily PRN Etta Quill, DO      . citalopram (CELEXA) tablet 20 mg  20 mg Oral q morning - 10a Etta Quill, DO   20 mg at 04/03/16 [id number]  . feeding supplement (ENSURE ENLIVE) (ENSURE ENLIVE) liquid 237 mL  237 mL Oral BID BM Courteney Lyn Mackuen, MD   237 mL at 04/03/16 0849  . gabapentin (NEURONTIN) capsule 100 mg  100 mg Oral QHS Etta Quill, DO   100 mg at 04/02/16 2158  . Gerhardt's butt cream   Topical QID Albertine Patricia, MD      . heparin injection 5,000 Units  5,000 Units Subcutaneous Q8H Etta Quill, DO   5,000 Units at 04/03/16 0607  . hydrALAZINE  (APRESOLINE) tablet 25 mg  25 mg Oral TID Etta Quill, DO   25 mg at 04/03/16 0849  . insulin aspart (novoLOG) injection 0-9 Units  0-9 Units Subcutaneous TID WC Etta Quill, DO   1 Units at 04/03/16 0848  . isosorbide mononitrate (IMDUR) 24 hr tablet 30 mg  30 mg Oral q morning - 10a Etta Quill, DO   30 mg at 04/03/16 0840  . labetalol (NORMODYNE,TRANDATE) injection 10-20 mg  10-20 mg Intravenous Q2H PRN Etta Quill, DO      . levothyroxine (SYNTHROID, LEVOTHROID) tablet 100 mcg  100 mcg Oral QAC breakfast Etta Quill, DO   100 mcg at 04/03/16 0846  . lipase/protease/amylase (CREON) capsule 12,000 Units  12,000 Units Oral TID WC Etta Quill, DO   12,000 Units at 04/03/16 0840  . meclizine (ANTIVERT) tablet 12.5 mg  12.5 mg Oral TID PRN Etta Quill, DO      . multivitamin (PROSIGHT) tablet 1  tablet  1 tablet Oral Daily Courteney Lyn Mackuen, MD   1 tablet at 04/03/16 0847  . OLANZapine (ZYPREXA) tablet 5 mg  5 mg Oral q morning - 10a Etta Quill, DO   5 mg at 04/03/16 0848  . oxyCODONE-acetaminophen (PERCOCET/ROXICET) 5-325 MG per tablet 1 tablet  1 tablet Oral Q4H PRN Etta Quill, DO      . pantoprazole (PROTONIX) EC tablet 40 mg  40 mg Oral Daily Etta Quill, DO   40 mg at 04/03/16 0847  . polyethylene glycol (MIRALAX / GLYCOLAX) packet 17 g  17 g Oral Daily PRN Etta Quill, DO      . QUEtiapine (SEROQUEL) tablet 50 mg  50 mg Oral QHS Etta Quill, DO   50 mg at 04/02/16 2159  . senna-docusate (Senokot-S) tablet 2 tablet  2 tablet Oral BID PRN Etta Quill, DO      . tamsulosin [facility]) capsule 0.4 mg  0.4 mg Oral QHS Etta Quill, DO   0.4 mg at 04/02/16 2159  . tiZANidine (ZANAFLEX) tablet 4 mg  4 mg Oral Q6H PRN Etta Quill, DO         Discharge Medications: Please see discharge summary for a list of discharge medications.  Relevant Imaging Results:  Relevant Lab Results:   Additional Information SS#: [id number]  Jorge Ny, LCSW

## 2016-04-03 NOTE — Progress Notes (Addendum)
CSW spoke with pt dtr [name] concerning plan for time of DC- confirmed plan is for patient to return to [facility] today.  Patient will discharge to [facility] Anticipated discharge date: 1/29 Family notified: pt dtr [place] by Corey Harold- scheduled for 3:15pm  CSW signing off.  Jorge Ny, LCSW Clinical Social Worker [phone]

## 2016-04-03 NOTE — Care Management Note (Signed)
Case Management Note  Patient Details  Name: Nathan Blackwell MRN: [id number] Date of Birth: 16-Mar-1920  Subjective/Objective:           Admitted with hyperosmolality and hypernatremia. Hx of dementia, HTN, DM2, CKD stage 3, OSA. Patient had a recent admission  from 1/13 to 1/18  for Influenza A virus with PNA .     Action/Plan: Plan is to d/c to SNF. CSW managing disposition to SNF. CM to f/u as needs presents.  Expected Discharge Date:   04/03/2016           Expected Discharge Plan:  [place]  In-House Referral:  Clinical Social Work  Discharge planning Services  CM Consult  Status of Service:  Completed, signed off  If discussed at [employer] of Stay Meetings, dates discussed:    Additional Comments:  Sharin Mons, RN 04/03/2016, 11:45 AM

## 2016-04-03 NOTE — Progress Notes (Signed)
[name] to be D/C'd  [facility] per MD order.    VSS, Skin clean, dry and intact without evidence of skin break down, no evidence of skin tears noted. IV catheter discontinued intact. Site without signs and symptoms of complications. Dressing and pressure applied.  An After Visit Summary was printed and given to the patient by the social worker.  D/c education completed including follow up instructions, medication list, d/c activities limitations if indicated, with other d/c instructions as indicated by MD. All instruction given to [facility], all questions were answered. Call was placed to [facility] to give report on pt.  Patient escorted via stretcher, and D/C [facility] via ambulance.  Dorris Carnes 04/03/2016 5:22 PM

## 2016-04-04 ENCOUNTER — Non-Acute Institutional Stay (SKILLED_NURSING_FACILITY): Payer: Medicare Other | Admitting: Adult Health

## 2016-04-04 ENCOUNTER — Encounter: Payer: Self-pay | Admitting: Adult Health

## 2016-04-04 ENCOUNTER — Telehealth: Payer: Self-pay | Admitting: *Deleted

## 2016-04-04 DIAGNOSIS — N183 Chronic kidney disease, stage 3 unspecified: Secondary | ICD-10-CM

## 2016-04-04 DIAGNOSIS — E1140 Type 2 diabetes mellitus with diabetic neuropathy, unspecified: Secondary | ICD-10-CM | POA: Diagnosis not present

## 2016-04-04 DIAGNOSIS — F0391 Unspecified dementia with behavioral disturbance: Secondary | ICD-10-CM

## 2016-04-04 DIAGNOSIS — R1310 Dysphagia, unspecified: Secondary | ICD-10-CM | POA: Diagnosis not present

## 2016-04-04 DIAGNOSIS — L89153 Pressure ulcer of sacral region, stage 3: Secondary | ICD-10-CM

## 2016-04-04 DIAGNOSIS — I5032 Chronic diastolic (congestive) heart failure: Secondary | ICD-10-CM | POA: Diagnosis not present

## 2016-04-04 DIAGNOSIS — I10 Essential (primary) hypertension: Secondary | ICD-10-CM

## 2016-04-04 DIAGNOSIS — D638 Anemia in other chronic diseases classified elsewhere: Secondary | ICD-10-CM

## 2016-04-04 DIAGNOSIS — I25810 Atherosclerosis of coronary artery bypass graft(s) without angina pectoris: Secondary | ICD-10-CM

## 2016-04-04 DIAGNOSIS — K5901 Slow transit constipation: Secondary | ICD-10-CM

## 2016-04-04 DIAGNOSIS — E870 Hyperosmolality and hypernatremia: Secondary | ICD-10-CM

## 2016-04-04 DIAGNOSIS — N400 Enlarged prostate without lower urinary tract symptoms: Secondary | ICD-10-CM

## 2016-04-04 DIAGNOSIS — F339 Major depressive disorder, recurrent, unspecified: Secondary | ICD-10-CM

## 2016-04-04 DIAGNOSIS — G4733 Obstructive sleep apnea (adult) (pediatric): Secondary | ICD-10-CM

## 2016-04-04 DIAGNOSIS — E039 Hypothyroidism, unspecified: Secondary | ICD-10-CM

## 2016-04-04 DIAGNOSIS — R5381 Other malaise: Secondary | ICD-10-CM

## 2016-04-04 DIAGNOSIS — K219 Gastro-esophageal reflux disease without esophagitis: Secondary | ICD-10-CM

## 2016-04-04 DIAGNOSIS — E43 Unspecified severe protein-calorie malnutrition: Secondary | ICD-10-CM

## 2016-04-04 DIAGNOSIS — G629 Polyneuropathy, unspecified: Secondary | ICD-10-CM

## 2016-04-04 NOTE — Telephone Encounter (Signed)
Pt was on TCM list admitted from 1/13 to 1/18 to our service for Influenza A virus with PNA. Patient was discharged to SNF. At SNF he has not been eating or drinking much of anything and lab work he was noted to have hypernatremia to 157. Pt is being discharge back to SNF, and in d/c summary will need to f/u w/PCP once discharge from SNF...Johny Chess

## 2016-04-04 NOTE — Progress Notes (Signed)
DATE:  04/04/2016   MRN:  [id number]  BIRTHDAY: Nov 30, 1920  Facility:  Nursing Home Location:  [place] Room Number: 1203-P  LEVEL OF CARE:  SNF (31)  Contact Information    Name Relation Home Work [place] Daughter   [phone]   Shrihan, Dicola [phone]  [phone]   Rohail, Schmoldt   [phone]       Code Status History    Date Active Date Inactive Code Status Order ID Comments User Context   03/31/2016  9:54 PM 04/03/2016  8:07 PM DNR [id number]  Etta Quill, DO ED   03/18/2016  2:38 AM 03/23/2016  8:21 PM DNR [id number]  Toy Baker, MD Inpatient   10/19/2014 10:08 PM 10/22/2014  1:45 PM DNR [id number]  Toy Baker, MD Inpatient   08/18/2014 12:44 AM 08/21/2014  7:17 PM Full Code [id number]  Ivor Costa, MD Inpatient   05/25/2013  2:40 AM 05/26/2013  1:35 PM Full Code [id number]  Theressa Millard, MD Inpatient   01/07/2011  5:05 PM 01/08/2011  3:38 PM Full Code [id number]  Pilar Plate, RN Inpatient    Questions for Most Recent Historical Code Status (Order [id number])    Question Answer Comment   In the event of cardiac or respiratory ARREST Do not call a "code blue"    In the event of cardiac or respiratory ARREST Do not perform Intubation, CPR, defibrillation or ACLS    In the event of cardiac or respiratory ARREST Use medication by any route, position, wound care, and other measures to relive pain and suffering. May use oxygen, suction and manual treatment of airway obstruction as needed for comfort.        Chief Complaint  Patient presents with  . Hospitalization Follow-up    HISTORY OF PRESENT ILLNESS:  This is a 83-YO male readmitted to [facility] on 04/03/2016 following an admission at [facility] 03/31/2016-04/03/2016 for hypernatremia to 157 in the setting of dehydration. He was given IVF with improvement in serum sodium level - 146. Of note, he was having a short-term  rehabilitation @ [facility]  post hospitalization @ [facility] 03-18-16 to 03-23-16 for Influenza A virus with PNA.  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 He was wearing bilateral hearing aides and said that they were from [place]:  Past Medical History:  Diagnosis Date  . ANEMIA-NOS 06/23/2008  . ARTHRITIS 02/23/2007  . B12 deficiency 05/25/2010  . BACK PAIN, THORACIC REGION 06/04/2008  . BENIGN PROSTATIC HYPERTROPHY 10/08/2006  . BRADYCARDIA 06/10/2009  . Cancer of colon ([place]) dx'd 1998  . 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 CLOSTRIDIUM DIFFICILE COLITIS 01/30/2007  . COLON CANCER, HX OF 09/20/2006  . COLONIC POLYPS, HX OF 10/08/2006  . CONSTIPATION, RECURRENT 01/06/2010  . DEMENTIA 05/14/2007  . DEPRESSION 02/27/2007  . Diabetes mellitus ([place])   . DIABETES MELLITUS, TYPE II 09/20/2006  . Diastolic congestive heart failure ([place])   . DVT of lower limb, acute ([place]) 01/12/2011  . Elevated PSA   . GERD 01/14/2007  . HYPERLIPIDEMIA 09/20/2006  . HYPERTENSION 09/20/2006  . HYPOTHYROIDISM 01/14/2007  . 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 NEPHROLITHIASIS, HX OF 01/14/2007  . OSTEOPOROSIS 10/08/2006  .  PERIPHERAL EDEMA 09/03/2007  . Prostatic hypertrophy    benign  . RENAL INSUFFICIENCY 04/01/2010  . Rotator cuff syndrome    chronic  . SLEEP APNEA, OBSTRUCTIVE 01/14/2007   CPAP dependent  . THYROID NODULE 01/14/2007  . Vitamin B 12 deficiency      CURRENT MEDICATIONS: Reviewed  Patient's Medications  New Prescriptions   No medications on file  Previous Medications   ALBUTEROL (PROVENTIL) (2.5 MG/3ML) 0.083% NEBULIZER SOLUTION    Take 3 mLs (2.5 mg total) by nebulization every 2 (two) hours as needed for wheezing.   ASPIRIN EC 81 MG EC TABLET    Take 1 tablet (81 mg total) by mouth  daily.   BISACODYL (DULCOLAX) 10 MG SUPPOSITORY    Place 1 suppository (10 mg total) rectally daily as needed for moderate constipation.   CHOLECALCIFEROL (VITAMIN D) 1000 UNITS TABLET    Take 1,000 Units by mouth every morning.    CITALOPRAM (CELEXA) 20 MG TABLET    TAKE 1 TABLET BY MOUTH EVERY MORNING   CREON 12000 UNITS CPEP CAPSULE    TAKE 1 CAPSULE BY MOUTH THREE TIMES DAILY WITH MEALS   FAMOTIDINE (PEPCID) 20 MG TABLET    Take 1 tablet (20 mg total) by mouth daily.   GABAPENTIN (NEURONTIN) 100 MG CAPSULE    Take 100 mg by mouth at bedtime.   HYDRALAZINE (APRESOLINE) 25 MG TABLET    Take 1 tablet (25 mg total) by mouth 3 (three) times daily.   ISOSORBIDE MONONITRATE (IMDUR) 30 MG 24 HR TABLET    TAKE 1 TABLET BY MOUTH EVERY MORNING   JANUVIA 50 MG TABLET    TAKE 1 TABLET (50 MG TOTAL) BY MOUTH EVERY MORNING.   LANSOPRAZOLE (PREVACID) 30 MG CAPSULE    TAKE 1 CAPSULE BY MOUTH EVERY MORNING   MECLIZINE (ANTIVERT) 12.5 MG TABLET    TAKE 1 TABLET BY MOUTH 3 TIMES A DAY AS NEEDED FOR DIZZINESS   MULTIPLE VITAMINS-MINERALS (DECUBI-VITE PO)    Take 1 capsule by mouth daily.    NUTRITIONAL SUPPLEMENT LIQD    Take 120 mLs by mouth 2 (two) times daily between meals. MedPass 2.0   OLANZAPINE (ZYPREXA) 5 MG TABLET    TAKE 1 TABLET BY MOUTH EVERY MORNING   ONDANSETRON (ZOFRAN) 4 MG TABLET    Take 1 tablet (4 mg total) by mouth every 8 (eight) hours as needed for nausea or vomiting.   ONE TOUCH ULTRA TEST TEST STRIP    USE TO CHECK BLOOD SUGAR ONCE A DAY AS DIRECTED   OXYCODONE-ACETAMINOPHEN (PERCOCET/ROXICET) 5-325 MG TABLET    Take by mouth every 4 (four) hours as needed for moderate pain.    POLYETHYLENE GLYCOL (MIRALAX / GLYCOLAX) PACKET    Take 17 g by mouth daily as needed (for constipation).    POTASSIUM CHLORIDE (K-DUR) 10 MEQ TABLET    Take 1 tablet (10 mEq total) by mouth daily.   QUETIAPINE (SEROQUEL) 50 MG TABLET    TAKE 1 TABLET BY MOUTH DAILY AT BEDTIME   SENNOSIDES-DOCUSATE SODIUM  (SENOKOT-S) 8.6-50 MG TABLET    Take 2 tablets by mouth 2 (two) times daily as needed for constipation.    SYNTHROID 100 MCG TABLET    TAKE 1 TABLET BY MOUTH DAILY BEFORE BREAKFAST   TAMSULOSIN (FLOMAX) 0.4 MG CAPS CAPSULE    TAKE 1 CAPSULE BY MOUTH AT BEDTIME   TIZANIDINE (ZANAFLEX) 4 MG TABLET    Take 1 tablet (4 mg total) by mouth every  6 (six) hours as needed for muscle spasms.   TRAMADOL (ULTRAM) 50 MG TABLET    Take 1 tablet (50 mg total) by mouth every 8 (eight) hours as needed. For 14 days   ZINC OXIDE 20 % OINTMENT    Apply 1 application topically 3 (three) times daily.  Modified Medications   No medications on file  Discontinued Medications   DEXTROSE 5 % SOLUTION    Inject 70 mLs into the vein continuous. 70 mL/H x1 liter     Allergies  Allergen Reactions  . Biaxin [Clarithromycin] Other (See Comments)    Noted on MAR  . Other Other (See Comments)    Nephrologist has told patient not to eat many different foods (Tomatoes, Green Foods, etc).   . Sulfa Antibiotics Other (See Comments)    Noted on MAR     REVIEW OF SYSTEMS:  GENERAL: no change in appetite, no fatigue, no weight changes, no fever, chills or weakness EYES: Denies change in vision, dry eyes, eye pain, itching or discharge EARS: Denies change in hearing, ringing in ears, or earache NOSE: Denies nasal congestion or epistaxis MOUTH and THROAT: Denies oral discomfort, gingival pain or bleeding, pain from teeth or hoarseness   RESPIRATORY: no cough, SOB, DOE, wheezing, hemoptysis CARDIAC: no chest pain, edema or palpitations GI: no abdominal pain, diarrhea, constipation, heart burn, nausea or vomiting GU: Denies dysuria, frequency, hematuria, incontinence, or discharge PSYCHIATRIC: Denies feeling of depression or anxiety. No report of hallucinations, insomnia, paranoia, or agitation    PHYSICAL EXAMINATION  GENERAL APPEARANCE: Well nourished. In no acute distress. Normal body habitus SKIN:  Sacral pressure  ulcer, stage 2 HEAD: Normal in size and contour. No evidence of trauma EYES: Lids open and close normally. No blepharitis, entropion or ectropion. PERRL. Conjunctivae are clear and sclerae are white. Lenses are without opacity EARS: Pinnae are normal. Wears bilateral hearing aides and still slightly hard of hearing MOUTH and THROAT: Lips are without lesions. Oral mucosa is moist and without lesions. Tongue is normal in shape, size, and color and without lesions NECK: supple, trachea midline, no neck masses, no thyroid tenderness, no thyromegaly LYMPHATICS: no LAN in the neck, no supraclavicular LAN RESPIRATORY: breathing is even & unlabored, BS CTAB CARDIAC: RRR, no murmur,no extra heart sounds, no edema GI: abdomen soft, normal BS, no masses, no tenderness, no hepatomegaly, no splenomegaly EXTREMITIES:  Able to move X 4 extremities PSYCHIATRIC: Alert and oriented X 3. Affect and behavior are appropriate   LABS/RADIOLOGY: Labs reviewed: Basic Metabolic Panel:  Recent Labs  03/18/16 0337  03/20/16 0803  04/01/16 0633 04/02/16 0547 04/03/16 0613  NA 143  < > 151*  < > 155* 143 146*  K 3.5  < > 3.3*  < > 3.0* 3.8 4.9  CL 101  < > 112*  < > 123* 117* 119*  CO2 28  < > 27  < > 21* 18* 18*  GLUCOSE 161*  < > 215*  < > 132* 184* 127*  BUN 67*  < > 55*  < > 31* 33* 28*  CREATININE 2.80*  < > 2.07*  < > 1.70* 1.88* 1.64*  CALCIUM 8.5*  < > 8.0*  < > 8.5* 8.1* 8.6*  MG 2.2  --  2.4  --   --  1.9  --   PHOS 4.1  --   --   --   --   --   --   < > = values in this interval  not displayed.  Liver Function Tests:  Recent Labs  12/23/15 0939 03/18/16 0337 03/31/16 1810  AST 14 37 25  ALT 9 20 23   ALKPHOS 66 80 106  BILITOT 0.4 0.6 0.8  PROT 7.3 6.7 7.2  ALBUMIN 4.1 3.2* 3.4*    Recent Labs  06/24/15 1100  LIPASE 30    CBC:  Recent Labs  03/22/16 1128 03/29/16 03/31/16 1810 04/02/16 0547 04/02/16 0844 04/03/16 0613  WBC 9.9 10.8 16.2* 10.0  --  9.7  NEUTROABS 8.2* 9  13.4*  --   --   --   HGB 12.5* 11.7* 13.3 9.0* 8.6* 11.3*  HCT 36.3* 36* 41.9 27.9*  --  34.9*  MCV 92.8  --  98.4 97.6  --  97.5  PLT 227 329 402* 240  --  214   Lipid Panel:  Recent Labs  12/23/15 0939  HDL 42.60   Cardiac Enzymes:  Recent Labs  03/18/16 0116 03/18/16 0724 03/18/16 1534  CKTOTAL 718*  --   --   TROPONINI 0.13* 0.12* 0.10*   CBG:  Recent Labs  04/02/16 2154 04/03/16 0803 04/03/16 1201  GLUCAP 118* 129* 217*      Dg Chest 2 View  Result Date: 03/31/2016 CLINICAL DATA:  83 y/o  M; chest pain. EXAM: CHEST  2 VIEW COMPARISON:  03/18/2016 chest radiograph FINDINGS: The heart size and mediastinal contours are within normal limits and stable. Aortic atherosclerosis with calcification. Improved aeration of the right lung. Linear opacities at left lung base probably represent scarring or minor atelectasis. No focal consolidation. No pleural effusion. Degenerative changes of the thoracic spine. IMPRESSION: Improved aeration of the right lung. No new focal consolidation or pleural effusion. Electronically Signed   By: Kristine Garbe M.D.   On: 03/31/2016 16:44   Dg Chest 2 View  Result Date: 03/18/2016 CLINICAL DATA:  Acute onset of progressive lethargy. Initial encounter. EXAM: CHEST  2 VIEW COMPARISON:  Chest radiograph performed 10/19/2014 FINDINGS: The lungs are well-aerated. Patchy right-sided airspace opacification raises concern for pneumonia. There is no evidence of pleural effusion or pneumothorax. The heart is borderline enlarged. No acute osseous abnormalities are seen. IMPRESSION: 1. Patchy right-sided airspace opacification raises concern for pneumonia. 2. Borderline cardiomegaly. Electronically Signed   By: Garald Balding M.D.   On: 03/18/2016 01:16   Dg Abd 1 View  Result Date: 03/18/2016 CLINICAL DATA:  Abdominal distention.  History of colon cancer. EXAM: ABDOMEN - 1 VIEW COMPARISON:  CT abdomen pelvis 06/24/2015 FINDINGS: There is gas  and stool seen within the colon. Anastomotic staple line is seen in the right lower quadrant. There is a paucity of small bowel gas. IMPRESSION: Paucity of small bowel gas without radiographic evidence of obstruction. Electronically Signed   By: Ulyses Jarred M.D.   On: 03/18/2016 05:46   Ct Head Wo Contrast  Result Date: 03/17/2016 CLINICAL DATA:  Altered mental status.  Lethargy. EXAM: CT HEAD WITHOUT CONTRAST TECHNIQUE: Contiguous axial images were obtained from the base of the skull through the vertex without intravenous contrast. COMPARISON:  06/24/2015 FINDINGS: Brain: Diffuse cerebral atrophy. Ventricular dilatation consistent with central atrophy. Low-attenuation changes in the deep white matter consistent with small vessel ischemia. No abnormal extra-axial fluid collections. No mass effect or midline shift. Gray-white matter junctions are distinct. Basal cisterns are not effaced. No acute intracranial hemorrhage. Vascular: Vascular calcifications. Skull: Normal. Negative for fracture or focal lesion. Sinuses/Orbits: Mild mucosal thickening in the paranasal sinuses with retention cyst in the right  maxillary antrum. Mastoid air cells are not opacified. Other: No significant changes since prior study. IMPRESSION: No acute intracranial abnormalities. Chronic atrophy and small vessel ischemic changes. Electronically Signed   By: Lucienne Capers M.D.   On: 03/17/2016 23:57   Dg Abd Portable 1v  Result Date: 03/19/2016 CLINICAL DATA:  Nausea and vomiting with abdominal distention. EXAM: PORTABLE ABDOMEN - 1 VIEW COMPARISON:  03/18/2016. FINDINGS: Gaseous distention of the colon is improved. Cholecystectomy clips RIGHT upper quadrant. Within limits for assessment on supine portable abdomen, no visible obstruction or free air. IMPRESSION: Negative. Electronically Signed   By: Staci Righter M.D.   On: 03/19/2016 13:43   Dg Swallowing Func-speech Pathology  Result Date: 03/31/2016 Please refer to "Notes"  tab for Speech Pathology notes.   ASSESSMENT/PLAN:  Physical deconditioning - for rehabilitation, PT and OT, for therapeutic strengthening exercises; fall precautions  Hypernatremia - in the setting of dehydration, treated with IPF and Lasix was held; encourage oral fluid intake; recheck BMP  Chronic kidney disease, stage III - will monitor; check BMP Lab Results  Component Value Date   CREATININE 1.64 (H) 04/03/2016   Chronic diastolic heart failure - EF of 55%; no SOB, stable  Anemia of chronic disease - stable Lab Results  Component Value Date   HGB 11.3 (L) 04/03/2016   Protein calorie malnutrition, severe - continue medpass 2.0 120 ml BID; RD consult  Dysphagia - ST evaluation and treatment; aspiration precaution  Diabetes mellitus, type II - continue Januvia 50 mg 1 tab by mouth daily and CBG daily Lab Results  Component Value Date   HGBA1C 7.1 (H) 03/18/2016   Sacral pressure ulcer, stage II - 1 treatment daily; continue decubivite 1 tab daily  Hypertension - continue hydralazine 25 mg 1 tab by mouth 3 times a day  OSA - continue CPAP at  bedtime  Dementia with behavioral disturbance - continue Zyprexa , Seroquel and supportive care; fall precautions  BPH - continue Flomax 0.4 mg 1 capsule by mouth daily   CAD - no complaints of chest pain; continue isosorbide mononitrate ER 30 mg 1 tab by mouth daily and aspirin EC 81 mg 1 tab by mouth daily  Constipation - continue Dulcolax suppository 10 mg daily when necessary, MiraLAX 17 g by mouth daily when necessary and senna S 8.6-50 mg 2 tabs by mouth twice a day when necessary  GERD - continue Prevacid 30 mg 20 mg 1 tab by mouth daily  Hypothyroidism - continue Synthroid 100 g 1 tab by mouth daily Lab Results  Component Value Date   TSH 1.155 03/18/2016   Depression - continue Celexa 20 mg 1 tab by mouth daily  Neuropathy - continue gabapentin 100 mg 1 capsule by mouth daily at bedtime     Goals of care:   Short-term rehabilitation    Camerin [facility] - NP [employer] [phone]

## 2016-04-06 ENCOUNTER — Telehealth: Payer: Self-pay

## 2016-04-06 NOTE — Telephone Encounter (Signed)
Spoke with Kathlee Nations at [facility]. Advised her that we have not seen the pt since 2015. Pt would need an appointment. I have scheduled the pt with RA for 04/07/16 at 2:45pm. Pt's last OV fell on 10/09/2013, which does not require pt to have a consult. I have held an extra 15 mins on RA's scheduled to allow catch up time.

## 2016-04-07 ENCOUNTER — Ambulatory Visit: Payer: Medicare Other | Admitting: Pulmonary Disease

## 2016-04-10 DIAGNOSIS — N183 Chronic kidney disease, stage 3 (moderate): Secondary | ICD-10-CM | POA: Diagnosis not present

## 2016-04-10 DIAGNOSIS — E11622 Type 2 diabetes mellitus with other skin ulcer: Secondary | ICD-10-CM | POA: Diagnosis not present

## 2016-04-10 DIAGNOSIS — L89310 Pressure ulcer of right buttock, unstageable: Secondary | ICD-10-CM | POA: Diagnosis not present

## 2016-04-10 DIAGNOSIS — L988 Other specified disorders of the skin and subcutaneous tissue: Secondary | ICD-10-CM | POA: Diagnosis not present

## 2016-04-10 LAB — CBC AND DIFFERENTIAL
HEMATOCRIT: 34 % — AB (ref 41–53)
HEMOGLOBIN: 10.8 g/dL — AB (ref 13.5–17.5)
Platelets: 241 10*3/uL (ref 150–399)
WBC: 12.9 10^3/mL

## 2016-04-10 LAB — BASIC METABOLIC PANEL
BUN: 29 mg/dL — AB (ref 4–21)
Creatinine: 1.6 mg/dL — AB (ref 0.6–1.3)
GLUCOSE: 171 mg/dL
POTASSIUM: 3.5 mmol/L (ref 3.4–5.3)
SODIUM: 157 mmol/L — AB (ref 137–147)

## 2016-04-11 ENCOUNTER — Ambulatory Visit: Payer: Medicare Other | Admitting: Family Medicine

## 2016-04-12 DIAGNOSIS — R627 Adult failure to thrive: Secondary | ICD-10-CM | POA: Diagnosis not present

## 2016-04-14 ENCOUNTER — Non-Acute Institutional Stay (SKILLED_NURSING_FACILITY): Payer: Medicare Other | Admitting: Internal Medicine

## 2016-04-14 ENCOUNTER — Encounter: Payer: Self-pay | Admitting: Internal Medicine

## 2016-04-14 DIAGNOSIS — N183 Chronic kidney disease, stage 3 unspecified: Secondary | ICD-10-CM

## 2016-04-14 DIAGNOSIS — E1122 Type 2 diabetes mellitus with diabetic chronic kidney disease: Secondary | ICD-10-CM | POA: Diagnosis not present

## 2016-04-14 DIAGNOSIS — F0391 Unspecified dementia with behavioral disturbance: Secondary | ICD-10-CM | POA: Diagnosis not present

## 2016-04-14 DIAGNOSIS — E440 Moderate protein-calorie malnutrition: Secondary | ICD-10-CM

## 2016-04-14 DIAGNOSIS — R531 Weakness: Secondary | ICD-10-CM

## 2016-04-14 DIAGNOSIS — R1314 Dysphagia, pharyngoesophageal phase: Secondary | ICD-10-CM

## 2016-04-14 DIAGNOSIS — R296 Repeated falls: Secondary | ICD-10-CM

## 2016-04-14 DIAGNOSIS — E039 Hypothyroidism, unspecified: Secondary | ICD-10-CM | POA: Diagnosis not present

## 2016-04-14 DIAGNOSIS — E870 Hyperosmolality and hypernatremia: Secondary | ICD-10-CM

## 2016-04-14 DIAGNOSIS — F05 Delirium due to known physiological condition: Secondary | ICD-10-CM
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
## 2016-04-17 ENCOUNTER — Emergency Department (HOSPITAL_COMMUNITY)
Admission: EM | Admit: 2016-04-17 | Discharge: 2016-04-18 | Disposition: A | Discharge status: Home / Self Care | Payer: Medicare Other | Attending: Emergency Medicine | Admitting: Emergency Medicine

## 2016-04-17 ENCOUNTER — Emergency Department (HOSPITAL_COMMUNITY): Payer: Medicare Other

## 2016-04-17 ENCOUNTER — Encounter (HOSPITAL_COMMUNITY): Payer: Self-pay | Admitting: Emergency Medicine

## 2016-04-17 DIAGNOSIS — L89213 Pressure ulcer of right hip, stage 3: Secondary | ICD-10-CM | POA: Diagnosis not present

## 2016-04-17 DIAGNOSIS — R05 Cough: Secondary | ICD-10-CM | POA: Diagnosis not present

## 2016-04-17 DIAGNOSIS — E039 Hypothyroidism, unspecified: Secondary | ICD-10-CM | POA: Diagnosis not present

## 2016-04-17 DIAGNOSIS — Z85828 Personal history of other malignant neoplasm of skin: Secondary | ICD-10-CM | POA: Insufficient documentation

## 2016-04-17 DIAGNOSIS — Z7984 Long term (current) use of oral hypoglycemic drugs: Secondary | ICD-10-CM | POA: Insufficient documentation

## 2016-04-17 DIAGNOSIS — E876 Hypokalemia: Secondary | ICD-10-CM

## 2016-04-17 DIAGNOSIS — R059 Cough, unspecified: Secondary | ICD-10-CM

## 2016-04-17 DIAGNOSIS — I5030 Unspecified diastolic (congestive) heart failure: Secondary | ICD-10-CM | POA: Diagnosis not present

## 2016-04-17 DIAGNOSIS — I130 Hypertensive heart and chronic kidney disease with heart failure and stage 1 through stage 4 chronic kidney disease, or unspecified chronic kidney disease: Secondary | ICD-10-CM | POA: Diagnosis not present

## 2016-04-17 DIAGNOSIS — N183 Chronic kidney disease, stage 3 (moderate): Secondary | ICD-10-CM | POA: Diagnosis not present

## 2016-04-17 DIAGNOSIS — R0789 Other chest pain: Secondary | ICD-10-CM

## 2016-04-17 DIAGNOSIS — Z79899 Other long term (current) drug therapy: Secondary | ICD-10-CM | POA: Insufficient documentation

## 2016-04-17 DIAGNOSIS — Z7982 Long term (current) use of aspirin: Secondary | ICD-10-CM | POA: Insufficient documentation

## 2016-04-17 DIAGNOSIS — E11628 Type 2 diabetes mellitus with other skin complications: Secondary | ICD-10-CM | POA: Diagnosis not present

## 2016-04-17 DIAGNOSIS — Z85038 Personal history of other malignant neoplasm of large intestine: Secondary | ICD-10-CM | POA: Insufficient documentation

## 2016-04-17 DIAGNOSIS — E1122 Type 2 diabetes mellitus with diabetic chronic kidney disease: Secondary | ICD-10-CM | POA: Insufficient documentation

## 2016-04-17 DIAGNOSIS — L89152 Pressure ulcer of sacral region, stage 2: Secondary | ICD-10-CM | POA: Diagnosis not present

## 2016-04-17 DIAGNOSIS — R079 Chest pain, unspecified: Secondary | ICD-10-CM | POA: Diagnosis not present

## 2016-04-17 LAB — COMPREHENSIVE METABOLIC PANEL
ALBUMIN: 2.7 g/dL — AB (ref 3.5–5.0)
ALT: 24 U/L (ref 17–63)
AST: 26 U/L (ref 15–41)
Alkaline Phosphatase: 158 U/L — ABNORMAL HIGH (ref 38–126)
Anion gap: 12 (ref 5–15)
BILIRUBIN TOTAL: 0.9 mg/dL (ref 0.3–1.2)
BUN: 19 mg/dL (ref 6–20)
CO2: 21 mmol/L — AB (ref 22–32)
Calcium: 9.4 mg/dL (ref 8.9–10.3)
Chloride: 110 mmol/L (ref 101–111)
Creatinine, Ser: 1.65 mg/dL — ABNORMAL HIGH (ref 0.61–1.24)
GFR calc Af Amer: 40 mL/min — ABNORMAL LOW (ref >60)
GFR calc non Af Amer: 34 mL/min — ABNORMAL LOW (ref >60)
GLUCOSE: 150 mg/dL — AB (ref 65–99)
POTASSIUM: 3.1 mmol/L — AB (ref 3.5–5.1)
SODIUM: 143 mmol/L (ref 135–145)
TOTAL PROTEIN: 6.5 g/dL (ref 6.5–8.1)

## 2016-04-17 LAB — TROPONIN I: Troponin I: 0.06 ng/mL (ref <0.03)

## 2016-04-17 LAB — CBC WITH DIFFERENTIAL/PLATELET
BASOS ABS: 0 10*3/uL (ref 0.0–0.1)
BASOS PCT: 0 %
Eosinophils Absolute: 0.1 10*3/uL (ref 0.0–0.7)
Eosinophils Relative: 1 %
HEMATOCRIT: 36.3 % — AB (ref 39.0–52.0)
HEMOGLOBIN: 12.2 g/dL — AB (ref 13.0–17.0)
Lymphocytes Relative: 14 %
Lymphs Abs: 1.5 10*3/uL (ref 0.7–4.0)
MCH: 31.9 pg (ref 26.0–34.0)
MCHC: 33.6 g/dL (ref 30.0–36.0)
MCV: 94.8 fL (ref 78.0–100.0)
Monocytes Absolute: 0.3 10*3/uL (ref 0.1–1.0)
Monocytes Relative: 3 %
NEUTROS ABS: 9.2 10*3/uL — AB (ref 1.7–7.7)
NEUTROS PCT: 82 %
Platelets: 255 10*3/uL (ref 150–400)
RBC: 3.83 MIL/uL — ABNORMAL LOW (ref 4.22–5.81)
RDW: 15.2 % (ref 11.5–15.5)
WBC: 11.1 10*3/uL — ABNORMAL HIGH (ref 4.0–10.5)

## 2016-04-17 LAB — BRAIN NATRIURETIC PEPTIDE: B Natriuretic Peptide: 110.3 pg/mL — ABNORMAL HIGH (ref 0.0–100.0)

## 2016-04-17 MED ORDER — POTASSIUM CHLORIDE 20 MEQ/15ML (10%) PO SOLN
20.0000 meq | Freq: Once | ORAL | Status: AC
  Administered 2016-04-17: 20 meq via ORAL
  Filled 2016-04-17: qty 15

## 2016-04-17 MED ORDER — SODIUM CHLORIDE 0.9 % IV SOLN
INTRAVENOUS | Status: DC
  Administered 2016-04-17: 21:00:00 via INTRAVENOUS

## 2016-04-17 NOTE — ED Provider Notes (Signed)
[facility] DEPT Provider Note   CSN: [id number] Arrival date & time: 04/17/16  1905     History   Chief Complaint Chief Complaint  Patient presents with  . Chest Pain    HPI Nathan Blackwell is a 83 y.o. male.  83 year old male presents with sharp left-sided chest pain worse with coughing or movement 24 hours. He does have a history of CHF. He is also had increasing edema in his arms and legs. Does have a history of renal failure as well as hypernatremia. He is currently a DO NOT RESUSCITATE. No fever or chills. Cough is been nonproductive. No vomiting or diarrhea noted. Patient is bedridden and does not ambulate at this time. EMS was called and patient was given aspirin and nitroglycerin without change of his symptoms.      Past Medical History:  Diagnosis Date  . ANEMIA-NOS 06/23/2008  . ARTHRITIS 02/23/2007  . B12 deficiency 05/25/2010  . BACK PAIN, THORACIC REGION 06/04/2008  . BENIGN PROSTATIC HYPERTROPHY 10/08/2006  . BRADYCARDIA 06/10/2009  . Cancer of colon ([place]) dx'd 1998  . 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 CLOSTRIDIUM DIFFICILE COLITIS 01/30/2007  . COLON CANCER, HX OF 09/20/2006  . COLONIC POLYPS, HX OF 10/08/2006  . CONSTIPATION, RECURRENT 01/06/2010  . DEMENTIA 05/14/2007  . DEPRESSION 02/27/2007  . Diabetes mellitus ([place])   . DIABETES MELLITUS, TYPE II 09/20/2006  . Diastolic congestive heart failure ([place])   . DVT of lower limb, acute ([place]) 01/12/2011  . Elevated PSA   . GERD 01/14/2007  . HYPERLIPIDEMIA 09/20/2006  . HYPERTENSION 09/20/2006  . HYPOTHYROIDISM 01/14/2007  . 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 NEPHROLITHIASIS, HX OF 01/14/2007  . OSTEOPOROSIS 10/08/2006  . PERIPHERAL EDEMA 09/03/2007  . Prostatic hypertrophy    benign  . RENAL INSUFFICIENCY 04/01/2010  . Rotator cuff syndrome    chronic  . SLEEP APNEA, OBSTRUCTIVE 01/14/2007   CPAP dependent  . THYROID NODULE 01/14/2007  . Vitamin B 12 deficiency     Patient Active Problem List   Diagnosis Date Noted  . AKI (acute kidney injury) ([place]) 03/31/2016  . Hyperosmolality and hypernatremia 03/31/2016  . Influenza A 03/18/2016  . NSTEMI (non-ST elevated myocardial infarction) ([place]) 03/18/2016  . Pressure injury of skin 03/18/2016  . Degenerative arthritis of left knee 02/17/2016  . Left knee pain 02/05/2016  . Right ankle pain 01/18/2016  . Skin lesion of right arm 01/18/2016  . Lesion of ear 01/18/2016  . Abnormal stress test 01/18/2016  . Chest pain 01/10/2016  . Right leg pain 12/23/2015  . Thyroid activity decreased   . Malnutrition of moderate degree ([place]) 10/20/2014  . Hypertensive urgency 10/19/2014  . Elevated troponin 10/19/2014  . Gait disorder 09/26/2014  . Pneumonia, viral   . Hypokalemia 08/18/2014  . Diastolic congestive heart failure ([place])   . Weakness 08/17/2014  . Nausea vomiting and diarrhea 08/17/2014  . Multifactorial gait disorder 06/24/2014  . Dysphagia, pharyngoesophageal phase 12/18/2013  . Esophageal web 12/18/2013  . Thrush 10/30/2013  . Ringworm 10/30/2013  . Cough 08/25/2013  . Exertional chest pain 08/22/2013  . Nausea and vomiting 05/25/2013  . Unspecified constipation 05/02/2013  . Shuffling gait 12/17/2012  . Recurrent falls 09/05/2012  . Orthostatic hypotension 08/06/2012  . Epigastric abdominal tenderness 07/14/2012  . Dysphagia, unspecified(787.20) 07/14/2012  . Pain with swallowing 06/28/2011  . Preventative health care 02/12/2011  . Back pain 01/23/2011  . Anticoagulation monitoring, INR range 2-3 01/08/2011  . CKD (chronic kidney  disease), stage III 01/08/2011  . H/O Deep venous thrombosis - left femoral vein 01/06/2011  . B12 deficiency 05/25/2010  . Stage 3 chronic renal impairment associated with type 2 diabetes mellitus ([place]) 04/01/2010  . CONSTIPATION, RECURRENT 01/06/2010  . HEMATOCHEZIA 01/06/2010  . BRADYCARDIA 06/10/2009  . FATIGUE 05/18/2009  . HIP PAIN, RIGHT 04/01/2009  . TINNITUS 10/13/2008  .  ANEMIA-NOS 06/23/2008  . Loss of weight 06/23/2008  . Other B-complex deficiencies 06/04/2008  . PARESTHESIA 06/04/2008  . PERIPHERAL EDEMA 09/03/2007  . OLECRANON BURSITIS, RIGHT 05/24/2007  . BURSITIS, RIGHT KNEE 05/24/2007  . Dementia 05/14/2007  . Major depression 02/27/2007  . Arthropathy 02/23/2007  . DIZZINESS 01/30/2007  . THYROID NODULE 01/14/2007  . Hypothyroidism 01/14/2007  . OSA (obstructive sleep apnea) 01/14/2007  . GERD 01/14/2007  . ROTATOR CUFF SYNDROME, LEFT 01/14/2007  . PSA, INCREASED 01/14/2007  . NEPHROLITHIASIS, HX OF 01/14/2007  . BENIGN PROSTATIC HYPERTROPHY 10/08/2006  . OSTEOPOROSIS 10/08/2006  . COLONIC POLYPS, HX OF 10/08/2006  . 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 Diabetes ([place]) 09/20/2006  . Hyperlipidemia 09/20/2006  . Essential hypertension 09/20/2006  . COLON CANCER, HX OF 09/20/2006    Past Surgical History:  Procedure Laterality Date  . APPENDECTOMY  1998  . CATARACT EXTRACTION, BILATERAL    . CHOLECYSTECTOMY  1999  . COLON RESECTION  1998  . ESOPHAGOGASTRODUODENOSCOPY N/A 12/30/2013   Procedure: ESOPHAGOGASTRODUODENOSCOPY (EGD);  Surgeon: Inda Castle, MD;  Location: [facility];  Service: Endoscopy;  Laterality: N/A;  . ESOPHAGOGASTRODUODENOSCOPY (EGD) WITH PROPOFOL N/A 12/19/2013   Procedure: ESOPHAGOGASTRODUODENOSCOPY (EGD) WITH PROPOFOL;  Surgeon: Inda Castle, MD;  Location: [facility];  Service: Endoscopy;  Laterality: N/A;  . inguinal herniorrhapy  1984  . NM LEXISCAN MYOVIEW LTD  09/02/2013   Normal LV function and wall motion. EF 62%; rate dependent LBBB with Lexiscan, Low Risk fixed inferior defect suggestive of diaphragmatic attenuation and not infarct with normal wall motion.  Marland Kitchen SAVORY DILATION N/A 12/19/2013   Procedure: SAVORY DILATION;  Surgeon: Inda Castle, MD;  Location: [facility];  Service: Endoscopy;  Laterality: N/A;  With Fluoroscopy  . SAVORY DILATION N/A 12/30/2013   Procedure: SAVORY DILATION;   Surgeon: Inda Castle, MD;  Location: [facility];  Service: Endoscopy;  Laterality: N/A;  . skin cancer extraction     right ear-extensive scar  . TRANSTHORACIC ECHOCARDIOGRAM  08/03/2009   Normal LV size. Mild LVH. EF 60-65%. Gr 2 DD, mild MR.       Home Medications    Prior to Admission medications   Medication Sig Start Date End Date Taking? Authorizing Provider  albuterol (PROVENTIL) (2.5 MG/3ML) 0.083% nebulizer solution Take 3 mLs (2.5 mg total) by nebulization every 2 (two) hours as needed for wheezing. 03/23/16   Hosie Poisson, MD  aspirin EC 81 MG EC tablet Take 1 tablet (81 mg total) by mouth daily. 10/21/14   Robbie Lis, MD  bisacodyl (DULCOLAX) 10 MG suppository Place 1 suppository (10 mg total) rectally daily as needed for moderate constipation. 10/21/14   Robbie Lis, MD  cholecalciferol (VITAMIN D) 1000 UNITS tablet Take 1,000 Units by mouth every morning.     Historical Provider, MD  CREON 12000 units CPEP capsule TAKE 1 CAPSULE BY MOUTH THREE TIMES DAILY WITH MEALS 03/23/15   Biagio Borg, MD  famotidine (PEPCID) 20 MG tablet Take 1 tablet (20 mg total) by mouth daily. 08/20/14   Robbie Lis, MD  gabapentin (NEURONTIN) 100 MG  capsule Take 100 mg by mouth at bedtime.    Historical Provider, MD  hydrALAZINE (APRESOLINE) 25 MG tablet Take 1 tablet (25 mg total) by mouth 3 (three) times daily. 08/20/14   Robbie Lis, MD  isosorbide mononitrate (IMDUR) 30 MG 24 hr tablet TAKE 1 TABLET BY MOUTH EVERY MORNING 01/17/16   Biagio Borg, MD  JANUVIA 50 MG tablet TAKE 1 TABLET (50 MG TOTAL) BY MOUTH EVERY MORNING. 11/16/15   Biagio Borg, MD  lansoprazole (PREVACID) 30 MG capsule TAKE 1 CAPSULE BY MOUTH EVERY MORNING 01/10/16   Biagio Borg, MD  meclizine (ANTIVERT) 12.5 MG tablet TAKE 1 TABLET BY MOUTH 3 TIMES A DAY AS NEEDED FOR DIZZINESS 07/01/15   Biagio Borg, MD  Multiple Vitamins-Minerals (DECUBI-VITE PO) Take 1 capsule by mouth daily.     Historical Provider, MD    NUTRITIONAL SUPPLEMENT LIQD Take 120 mLs by mouth 2 (two) times daily between meals. MedPass 2.0    Historical Provider, MD  OLANZapine (ZYPREXA) 5 MG tablet TAKE 1 TABLET BY MOUTH EVERY MORNING 03/07/16   Biagio Borg, MD  ondansetron (ZOFRAN) 4 MG tablet Take 1 tablet (4 mg total) by mouth every 8 (eight) hours as needed for nausea or vomiting. 04/12/15   Binnie Rail, MD  ONE TOUCH ULTRA TEST test strip USE TO CHECK BLOOD SUGAR ONCE A DAY AS DIRECTED 01/12/15   Biagio Borg, MD  oxyCODONE-acetaminophen (PERCOCET/ROXICET) 5-325 MG tablet Take by mouth every 4 (four) hours as needed for moderate pain.     Historical Provider, MD  polyethylene glycol (MIRALAX / GLYCOLAX) packet Take 17 g by mouth daily as needed (for constipation).     Historical Provider, MD  QUEtiapine (SEROQUEL) 50 MG tablet TAKE 1 TABLET BY MOUTH DAILY AT BEDTIME 09/28/15   Biagio Borg, MD  sennosides-docusate sodium (SENOKOT-S) 8.6-50 MG tablet Take 2 tablets by mouth 2 (two) times daily as needed for constipation.     Historical Provider, MD  SYNTHROID 100 MCG tablet TAKE 1 TABLET BY MOUTH DAILY BEFORE BREAKFAST 12/27/15   Biagio Borg, MD  tamsulosin (FLOMAX) 0.4 MG CAPS capsule TAKE 1 CAPSULE BY MOUTH AT BEDTIME 12/27/15   Biagio Borg, MD  tiZANidine (ZANAFLEX) 4 MG tablet Take 1 tablet (4 mg total) by mouth every 6 (six) hours as needed for muscle spasms. 12/29/14   Biagio Borg, MD  traMADol (ULTRAM) 50 MG tablet Take 1 tablet (50 mg total) by mouth every 8 (eight) hours as needed. For 14 days 03/23/16   Tiffany L Reed, DO  zinc oxide 20 % ointment Apply 1 application topically 3 (three) times daily.    Historical Provider, MD    Family History Family History  Problem Relation Age of Onset  . Stroke Father   . Diabetes Mother     Social History Social History  Substance Use Topics  . Smoking status: Never Smoker  . Smokeless tobacco: Never Used  . Alcohol use No     Allergies   Biaxin [clarithromycin]; Other;  and Sulfa antibiotics   Review of Systems Review of Systems  All other systems reviewed and are negative.    Physical Exam Updated Vital Signs SpO2 99%   Physical Exam  Constitutional: He is oriented to person, place, and time. He appears well-developed and well-nourished.  Non-toxic appearance. No distress.  HENT:  Head: Normocephalic and atraumatic.  Eyes: Conjunctivae, EOM and lids are normal. Pupils are equal, round,  and reactive to light.  Neck: Normal range of motion. Neck supple. No tracheal deviation present. No thyroid mass present.  Cardiovascular: Normal rate, regular rhythm and normal heart sounds.  Exam reveals no gallop.   No murmur heard. Pulmonary/Chest: No stridor. Tachypnea noted. He has decreased breath sounds in the left lower field. He has no wheezes. He has rhonchi in the left lower field. He has no rales.    Abdominal: Soft. Normal appearance and bowel sounds are normal. He exhibits no distension. There is no tenderness. There is no rebound and no CVA tenderness.  Musculoskeletal: Normal range of motion. He exhibits no edema or tenderness.  Neurological: He is alert and oriented to person, place, and time. He has normal strength. No cranial nerve deficit or sensory deficit. GCS eye subscore is 4. GCS verbal subscore is 5. GCS motor subscore is 6.  Skin: Skin is warm and dry. No abrasion and no rash noted.  Psychiatric: He has a normal mood and affect. His speech is normal and behavior is normal.  Nursing note and vitals reviewed.    ED Treatments / Results  Labs (all labs ordered are listed, but only abnormal results are displayed) Labs Reviewed  CBC WITH DIFFERENTIAL/PLATELET  COMPREHENSIVE METABOLIC PANEL  TROPONIN I  BRAIN NATRIURETIC PEPTIDE    EKG  EKG Interpretation  Date/Time:  Monday April 17 2016 19:44:22 EST Ventricular Rate:  61 PR Interval:    QRS Duration: 87 QT Interval:  214 QTC Calculation: 216 R Axis:   -31 Text  Interpretation:  Sinus rhythm LVH with secondary repolarization abnormality Artifact Confirmed by Zenia Resides  MD, Jeffie Widdowson ([id number]) on 04/17/2016 7:53:42 PM       Radiology No results found.  Procedures Procedures (including critical care time)  Medications Ordered in ED Medications  0.9 %  sodium chloride infusion (not administered)     Initial Impression / Assessment and Plan / ED Course  I have reviewed the triage vital signs and the nursing notes.  Pertinent labs & imaging results that were available during my care of the patient were reviewed by me and considered in my medical decision making (see chart for details).     Patient with reproducible chest wall pain. His troponin is improved from prior studies. Do not think that this represents ACS or PE. His chest x-ray is without acute findings. His sodium is improved. Potassium mildly decreased at 3.1 and replenish with oral potassium 20 mEq. He appears stable for discharge at this time.  Final Clinical Impressions(s) / ED Diagnoses   Final diagnoses:  Cough    New Prescriptions New Prescriptions   No medications on file     Lacretia Leigh, MD 04/17/16 [id number]

## 2016-04-17 NOTE — ED Notes (Signed)
Critical lab value:  Troponin 0.06  Physician notified

## 2016-04-17 NOTE — ED Notes (Signed)
Daughter: [name] [phone]

## 2016-04-17 NOTE — ED Triage Notes (Signed)
Per EMS pt [facility], dementia, acting at baseline, c/o chest pain LL rib cage, recent pneumonia, L sounds rhonchi, grunting is normal for pt. Pt was given 325 aspirin and 1 0.4mg  tablet nitro, still has pain afterwards, cough started today non productive. Pt on diuretic has h/s of heart failure.

## 2016-04-18 NOTE — ED Notes (Signed)
[facility] arrived, given paperwork. Patient unable to sign for self because of dementia. Daughter knows he is being discharged. Pt stable for transport

## 2016-04-18 NOTE — ED Notes (Signed)
In pod E

## 2016-04-20 DIAGNOSIS — R627 Adult failure to thrive: Secondary | ICD-10-CM | POA: Diagnosis not present

## 2016-04-21 ENCOUNTER — Non-Acute Institutional Stay (SKILLED_NURSING_FACILITY): Payer: Medicare Other | Admitting: Adult Health

## 2016-04-21 ENCOUNTER — Encounter: Payer: Self-pay | Admitting: Adult Health

## 2016-04-21 ENCOUNTER — Ambulatory Visit: Payer: Medicare Other | Admitting: Pulmonary Disease

## 2016-04-21 DIAGNOSIS — F419 Anxiety disorder, unspecified: Secondary | ICD-10-CM

## 2016-04-21 DIAGNOSIS — R404 Transient alteration of awareness: Secondary | ICD-10-CM | POA: Diagnosis not present

## 2016-04-21 DIAGNOSIS — K117 Disturbances of salivary secretion: Secondary | ICD-10-CM

## 2016-04-21 DIAGNOSIS — F0391 Unspecified dementia with behavioral disturbance: Secondary | ICD-10-CM | POA: Diagnosis not present

## 2016-04-25 ENCOUNTER — Ambulatory Visit: Payer: Medicare Other | Admitting: Cardiology

## 2016-05-04 NOTE — Progress Notes (Signed)
DATE:  05-13-2016   MRN:  [id number]  BIRTHDAY: [birth year]  Facility:  [facility] Home Location:  [facility] Room Number: 1203-P  LEVEL OF CARE:  SNF (31)  Contact Information    Name Relation Home Work Lindsey Daughter   [phone]   Jamarii, Rahl [phone]  [phone]   Jitender, Handel   [phone]       Code Status History    Date Active Date Inactive Code Status Order ID Comments User Context   03/31/2016  9:54 PM 04/03/2016  8:07 PM DNR [id number]  Etta Quill, DO ED   03/18/2016  2:38 AM 03/23/2016  8:21 PM DNR [id number]  Toy Baker, MD Inpatient   10/19/2014 10:08 PM 10/22/2014  1:45 PM DNR [id number]  Toy Baker, MD Inpatient   08/18/2014 12:44 AM 08/21/2014  7:17 PM Full Code [id number]  Ivor Costa, MD Inpatient   05/25/2013  2:40 AM 05/26/2013  1:35 PM Full Code [id number]  Theressa Millard, MD Inpatient   01/07/2011  5:05 PM 01/08/2011  3:38 PM Full Code [id number]  Pilar Plate, RN Inpatient    Questions for Most Recent Historical Code Status (Order [id number])    Question Answer Comment   In the event of cardiac or respiratory ARREST Do not call a "code blue"    In the event of cardiac or respiratory ARREST Do not perform Intubation, CPR, defibrillation or ACLS    In the event of cardiac or respiratory ARREST Use medication by any route, position, wound care, and other measures to relive pain and suffering. May use oxygen, suction and manual treatment of airway obstruction as needed for comfort.         Advance Directive Documentation   Flowsheet Row Most Recent Value  Type of Advance Directive  Out of facility DNR (pink MOST or yellow form)  Pre-existing out of facility DNR order (yellow form or pink MOST form)  No data  "MOST" Form in Place?  No data       Chief Complaint  Patient presents with  . Acute Visit    Change in condition    HISTORY OF PRESENT ILLNESS:  This is  a 83-YO male seen for an acute visit due to a change in his condition.  He was noted to be lethargic. He did not eat breakfast. He was seen in the room with wife, step daughter and son-in-law. He opens his eyes occasionally and slightly SOB so charge nurse put on O2 @ 2L/min via [place]. He is currently being followed-up by Palliative Care. Family has decided no hospitalization and for patient to be comfortable. He has PMH of Dementia with behavioral disturbance, neuropathy, CKD stage 3, CAD, chronic diastolic CHF,BPH. Hypertension and GERD.    PAST MEDICAL HISTORY:  Past Medical History:  Diagnosis Date  . ANEMIA-NOS 06/23/2008  . ARTHRITIS 02/23/2007  . B12 deficiency 05/25/2010  . BACK PAIN, THORACIC REGION 06/04/2008  . BENIGN PROSTATIC HYPERTROPHY 10/08/2006  . BRADYCARDIA 06/10/2009  . Cancer of colon ([place]) dx'd 1998  . 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 CLOSTRIDIUM DIFFICILE COLITIS 01/30/2007  . COLON CANCER, HX OF 09/20/2006  . COLONIC POLYPS, HX OF 10/08/2006  . CONSTIPATION, RECURRENT 01/06/2010  . DEMENTIA 05/14/2007  . DEPRESSION 02/27/2007  . Diabetes mellitus ([place])   . DIABETES MELLITUS, TYPE II 09/20/2006  . Diastolic congestive heart failure ([place])   . DVT of lower limb, acute ([place])  01/12/2011  . Elevated PSA   . GERD 01/14/2007  . HYPERLIPIDEMIA 09/20/2006  . HYPERTENSION 09/20/2006  . HYPOTHYROIDISM 01/14/2007  . 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 NEPHROLITHIASIS, HX OF 01/14/2007  . OSTEOPOROSIS 10/08/2006  . PERIPHERAL EDEMA 09/03/2007  . Prostatic hypertrophy    benign  . RENAL INSUFFICIENCY 04/01/2010  . Rotator cuff syndrome    chronic  . SLEEP APNEA, OBSTRUCTIVE 01/14/2007   CPAP dependent  . THYROID NODULE 01/14/2007  . Vitamin B 12 deficiency      CURRENT MEDICATIONS: Reviewed  Patient's Medications  New Prescriptions   No medications on file  Previous Medications   ALBUTEROL (PROVENTIL) (2.5 MG/3ML) 0.083% NEBULIZER SOLUTION    Take 3 mLs (2.5 mg total) by  nebulization every 2 (two) hours as needed for wheezing.   ASPIRIN EC 81 MG EC TABLET    Take 1 tablet (81 mg total) by mouth daily.   ATROPINE 1 % OPHTHALMIC SOLUTION    Place 2 drops under the tongue every 6 (six) hours as needed (Chronic oral secretions).   BISACODYL (DULCOLAX) 10 MG SUPPOSITORY    Place 1 suppository (10 mg total) rectally daily as needed for moderate constipation.   HYDRALAZINE (APRESOLINE) 25 MG TABLET    Take 1 tablet (25 mg total) by mouth 3 (three) times daily.   ISOSORBIDE MONONITRATE (IMDUR) 30 MG 24 HR TABLET    TAKE 1 TABLET BY MOUTH EVERY MORNING   LANSOPRAZOLE (PREVACID) 30 MG CAPSULE    TAKE 1 CAPSULE BY MOUTH EVERY MORNING   LORAZEPAM (ATIVAN) 2 MG/ML CONCENTRATED SOLUTION    Take 0.5 mg by mouth every 6 (six) hours as needed for anxiety.   MORPHINE (ROXANOL) 20 MG/ML CONCENTRATED SOLUTION    Take 0.5 mg by mouth every 6 (six) hours as needed for severe pain.   MULTIPLE VITAMINS-MINERALS (DECUBI-VITE PO)    Take 1 capsule by mouth daily.    NITROGLYCERIN (NITROSTAT) 0.4 MG SL TABLET    Place 0.4 mg under the tongue every 5 (five) minutes as needed for chest pain.   NUTRITIONAL SUPPLEMENT LIQD    Take 120 mLs by mouth 2 (two) times daily between meals. MedPass 2.0   ONE TOUCH ULTRA TEST TEST STRIP    USE TO CHECK BLOOD SUGAR ONCE A DAY AS DIRECTED   POLYETHYLENE GLYCOL (MIRALAX / GLYCOLAX) PACKET    Take 17 g by mouth daily as needed (for constipation).    SYNTHROID 100 MCG TABLET    TAKE 1 TABLET BY MOUTH DAILY BEFORE BREAKFAST   TAMSULOSIN (FLOMAX) 0.4 MG CAPS CAPSULE    TAKE 1 CAPSULE BY MOUTH AT BEDTIME   ZINC OXIDE 20 % OINTMENT    Apply 1 application topically 3 (three) times daily.  Modified Medications   No medications on file  Discontinued Medications   CHOLECALCIFEROL (VITAMIN D) 1000 UNITS TABLET    Take 1,000 Units by mouth every morning.    CREON 12000 UNITS CPEP CAPSULE    TAKE 1 CAPSULE BY MOUTH THREE TIMES DAILY WITH MEALS   FAMOTIDINE (PEPCID)  20 MG TABLET    Take 1 tablet (20 mg total) by mouth daily.   GABAPENTIN (NEURONTIN) 100 MG CAPSULE    Take 100 mg by mouth at bedtime.   JANUVIA 50 MG TABLET    TAKE 1 TABLET (50 MG TOTAL) BY MOUTH EVERY MORNING.   MECLIZINE (ANTIVERT) 12.5 MG TABLET    TAKE 1 TABLET BY MOUTH 3 TIMES A DAY AS  NEEDED FOR DIZZINESS   OLANZAPINE (ZYPREXA) 5 MG TABLET    TAKE 1 TABLET BY MOUTH EVERY MORNING   ONDANSETRON (ZOFRAN) 4 MG TABLET    Take 1 tablet (4 mg total) by mouth every 8 (eight) hours as needed for nausea or vomiting.   OXYCODONE-ACETAMINOPHEN (PERCOCET/ROXICET) 5-325 MG TABLET    Take by mouth every 4 (four) hours as needed for moderate pain.    QUETIAPINE (SEROQUEL) 50 MG TABLET    TAKE 1 TABLET BY MOUTH DAILY AT BEDTIME   SENNOSIDES-DOCUSATE SODIUM (SENOKOT-S) 8.6-50 MG TABLET    Take 2 tablets by mouth 2 (two) times daily as needed for constipation.    TIZANIDINE (ZANAFLEX) 4 MG TABLET    Take 1 tablet (4 mg total) by mouth every 6 (six) hours as needed for muscle spasms.   TRAMADOL (ULTRAM) 50 MG TABLET    Take 1 tablet (50 mg total) by mouth every 8 (eight) hours as needed. For 14 days     Allergies  Allergen Reactions  . Biaxin [Clarithromycin] Other (See Comments)    Noted on MAR  . Other Other (See Comments)    Nephrologist has told patient not to eat many different foods (Tomatoes, Green Foods, etc).   . Sulfa Antibiotics Other (See Comments)    Noted on MAR     REVIEW OF SYSTEMS:   Unable to obtain due advanced dementia and lethargy     PHYSICAL EXAMINATION  GENERAL APPEARANCE:  Normal body habitus SKIN:  Sacral decubitus stage 3 HEAD: Normal in size and contour. No evidence of trauma EYES: Lids open and close normally. No blepharitis, entropion or ectropion. PERRL. Conjunctivae are clear and sclerae are white. EARS: Pinnae are normal. Has bilateral hearing aids MOUTH and THROAT: Lips are without lesions. Oral mucosa is moist and without lesions. Tongue is normal in shape,  size, and color and without lesions NECK: supple, trachea midline, no neck masses, no thyroid tenderness, no thyromegaly LYMPHATICS: no LAN in the neck, no supraclavicular LAN RESPIRATORY:  Crackles noted on bilateral lung fields, +SOB CARDIAC: RRR, no murmur,no extra heart sounds GI: abdomen soft, normal BS, no masses, no tenderness, no hepatomegaly, no splenomegaly PSYCHIATRIC: Lethargic. Affect and behavior are appropriate  LABS/RADIOLOGY: 04/19/16   sodium 148 potassium 4.1 glucose 136 BUN 22 creatinine 1.59 calcium 8.6 GFR 43.34 Labs reviewed: Basic Metabolic Panel:  Recent Labs  03/18/16 0337  03/20/16 0803  04/02/16 0547 04/03/16 0613 04/10/16 04/17/16 1932  NA 143  < > 151*  < > 143 146* 157* 143  K 3.5  < > 3.3*  < > 3.8 4.9 3.5 3.1*  CL 101  < > 112*  < > 117* 119*  --  110  CO2 28  < > 27  < > 18* 18*  --  21*  GLUCOSE 161*  < > 215*  < > 184* 127*  --  150*  BUN 67*  < > 55*  < > 33* 28* 29* 19  CREATININE 2.80*  < > 2.07*  < > 1.88* 1.64* 1.6* 1.65*  CALCIUM 8.5*  < > 8.0*  < > 8.1* 8.6*  --  9.4  MG 2.2  --  2.4  --  1.9  --   --   --   PHOS 4.1  --   --   --   --   --   --   --   < > = values in this interval not displayed. Liver Function Tests:  Recent Labs  03/18/16 0337 03/31/16 1810 04/17/16 1932  AST 37 25 26  ALT 20 23 24   ALKPHOS 80 106 158*  BILITOT 0.6 0.8 0.9  PROT 6.7 7.2 6.5  ALBUMIN 3.2* 3.4* 2.7*    Recent Labs  06/24/15 1100  LIPASE 30   CBC:  Recent Labs  03/29/16 03/31/16 1810 04/02/16 0547  04/03/16 0613 04/10/16 04/17/16 1932  WBC 10.8 16.2* 10.0  --  9.7 12.9 11.1*  NEUTROABS 9 13.4*  --   --   --   --  9.2*  HGB 11.7* 13.3 9.0*  < > 11.3* 10.8* 12.2*  HCT 36* 41.9 27.9*  --  34.9* 34* 36.3*  MCV  --  98.4 97.6  --  97.5  --  94.8  PLT 329 402* 240  --  214 241 255  < > = values in this interval not displayed. A1C: Invalid input(s): A1C Lipid Panel:  Recent Labs  12/23/15 0939  HDL 42.60   Cardiac  Enzymes:  Recent Labs  03/18/16 0116 03/18/16 0724 03/18/16 1534 04/17/16 1932  CKTOTAL 718*  --   --   --   TROPONINI 0.13* 0.12* 0.10* 0.06*   CBG:  Recent Labs  04/02/16 2154 04/03/16 0803 04/03/16 1201  GLUCAP 118* 129* 217*      Dg Chest 2 View  Result Date: 04/17/2016 CLINICAL DATA:  Chest pain.  Recent pneumonia. EXAM: CHEST  2 VIEW COMPARISON:  Chest radiograph 03/31/2016 FINDINGS: The patient is kyphotic. There is no focal airspace consolidation or pulmonary edema. Medial lung apices are obscured by the soft tissues of the neck. No pneumothorax or pleural effusion. IMPRESSION: No focal airspace disease. Electronically Signed   By: Ulyses Jarred M.D.   On: 04/17/2016 21:14   Dg Chest 2 View  Result Date: 03/31/2016 CLINICAL DATA:  83 y/o  M; chest pain. EXAM: CHEST  2 VIEW COMPARISON:  03/18/2016 chest radiograph FINDINGS: The heart size and mediastinal contours are within normal limits and stable. Aortic atherosclerosis with calcification. Improved aeration of the right lung. Linear opacities at left lung base probably represent scarring or minor atelectasis. No focal consolidation. No pleural effusion. Degenerative changes of the thoracic spine. IMPRESSION: Improved aeration of the right lung. No new focal consolidation or pleural effusion. Electronically Signed   By: Kristine Garbe M.D.   On: 03/31/2016 16:44    ASSESSMENT/PLAN:  Dementia - advanced; he is currently lethargic and did not eat breakfast; family requested patient to be made comfortable, no labs, hospitalization nor IV fluids; will start Roxanol 20 mg/ml give 5mg /0.25 ml SL Q 6 hours PRN; discontinue Tramadol, Gabapentin, Zanaflex, Percocer, Meclizine, Zofran, Creon, Senna -S and CBG checks  Anxiety - will start Ativan 2 mg/ml give 0.5mg /0.25 ml SL Q 6 H PRN  Chronic secretions - start Atropine 1% drops give 2 gtts SL Q 6 horus PRN      Monina C. [place] - NP    [employer] [phone]

## 2016-05-04 DEATH — deceased

## 2016-06-24 IMAGING — MR MR MRA HEAD W/O CM
1 series · 19 of 48 positions shown · non-contrast
Comparison: Brain MRI without contrast 10/19/2014 and earlier.

CLINICAL DATA: [AGE] male with weakness dizziness and
vomiting since yesterday. Initial encounter.

EXAM:
MRA HEAD WITHOUT CONTRAST
TECHNIQUE: Angiographic images of the Circle of Willis were obtained using MRA
technique without intravenous contrast.

[Series 3: (id) mt fs · axial · 1.4mm · 0.39mm/px · z∈[-29,+64]mm · 19 of 143 slices shown]
[im 1/143]
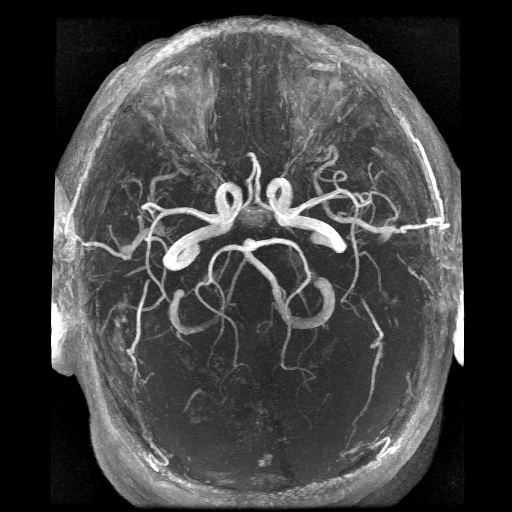
[im 4/143]
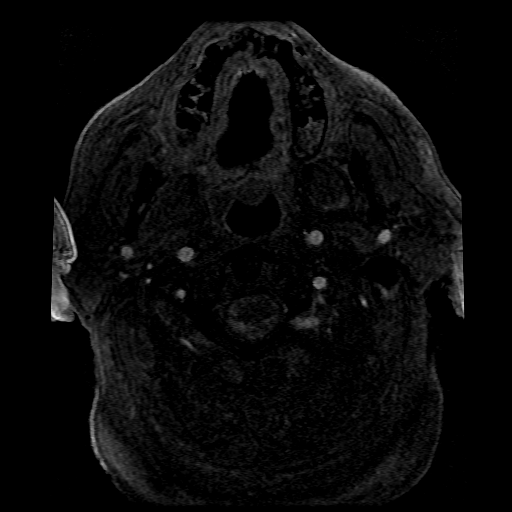
[im 7/143]
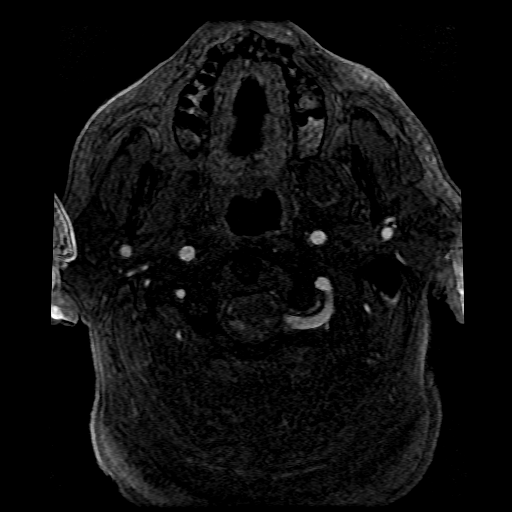
[im 10/143]
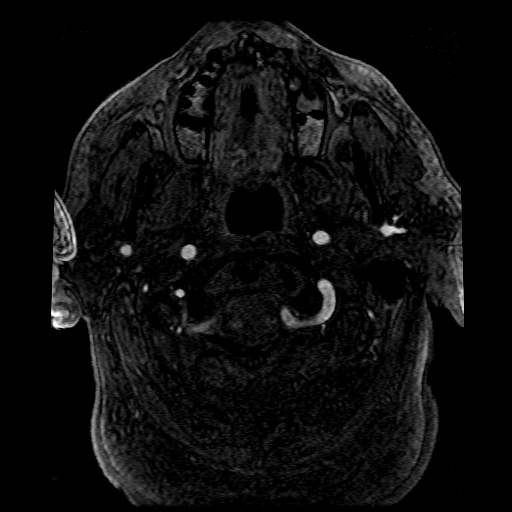
[im 13/143]
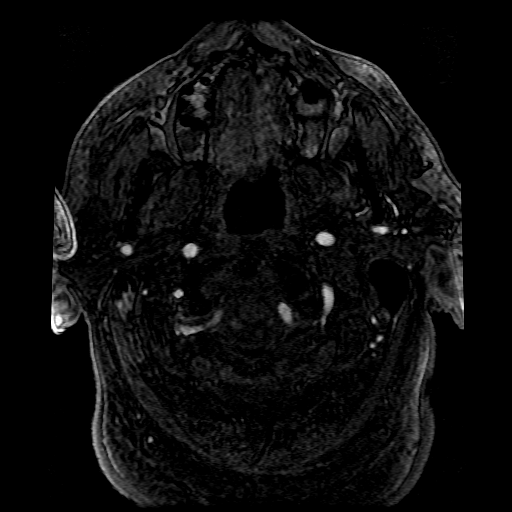
[im 16/143]
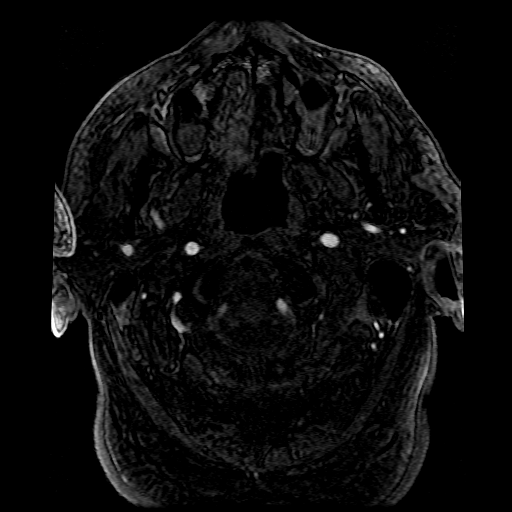
[im 19/143]
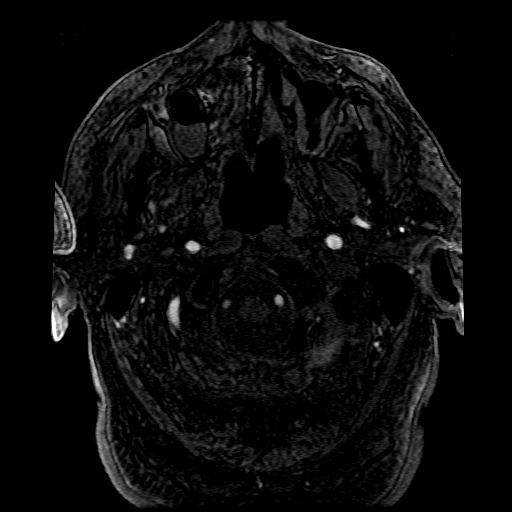
[im 22/143]
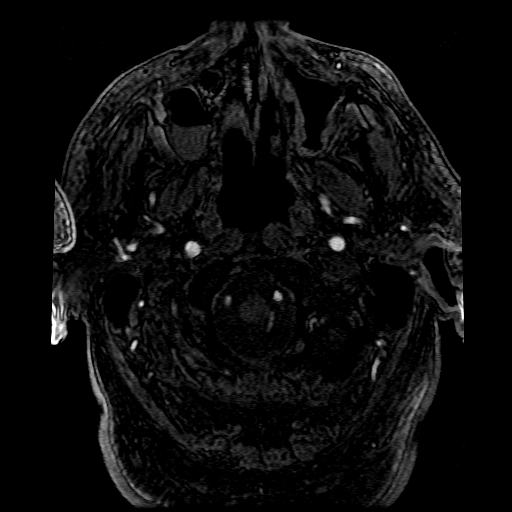
[im 25/143]
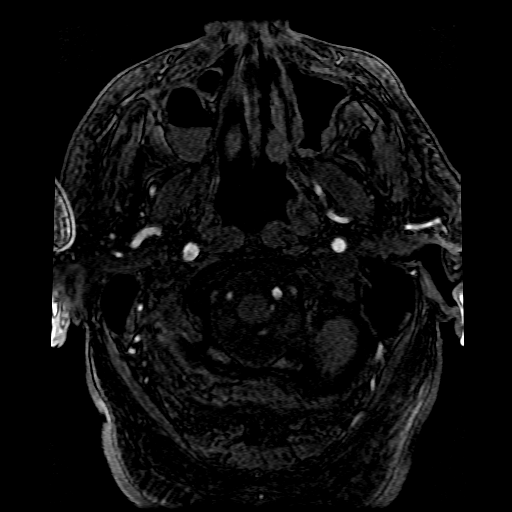
[im 28/143]
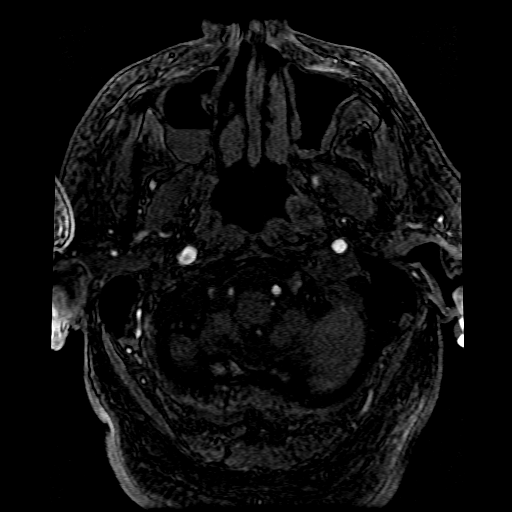
[im 31/143]
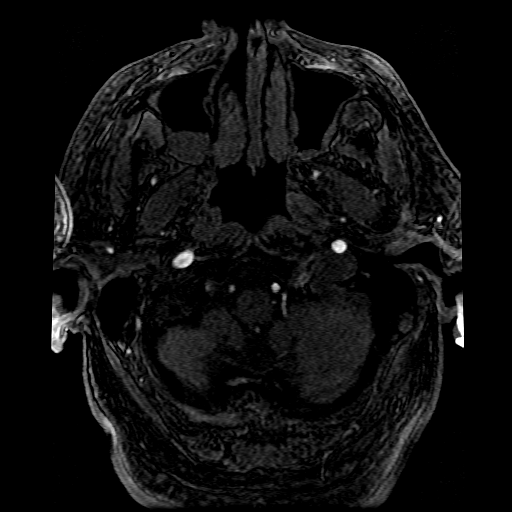
[im 46/143]
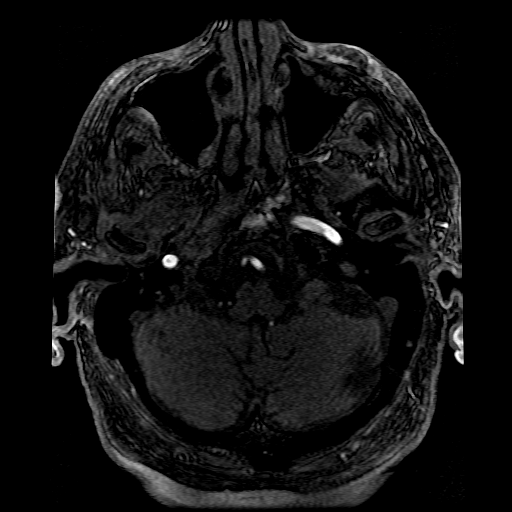
[im 64/143]
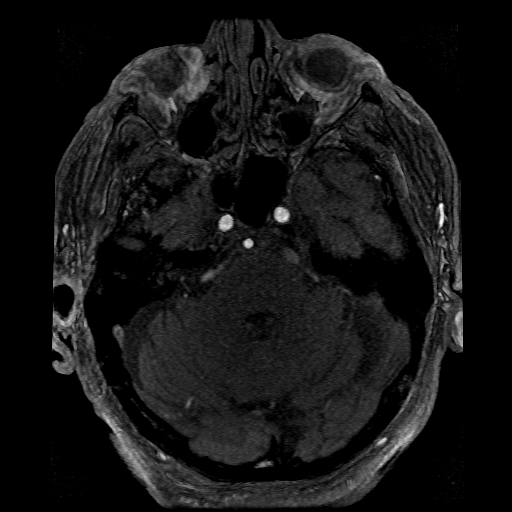
[im 73/143]
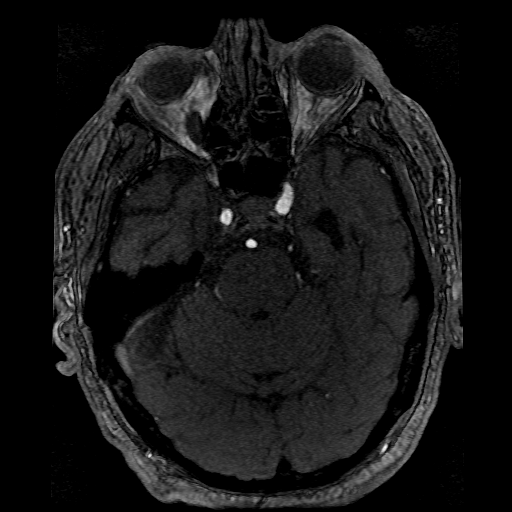
[im 82/143]
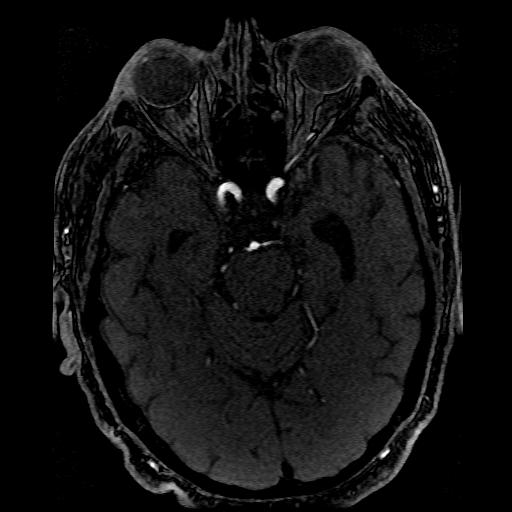
[im 100/143]
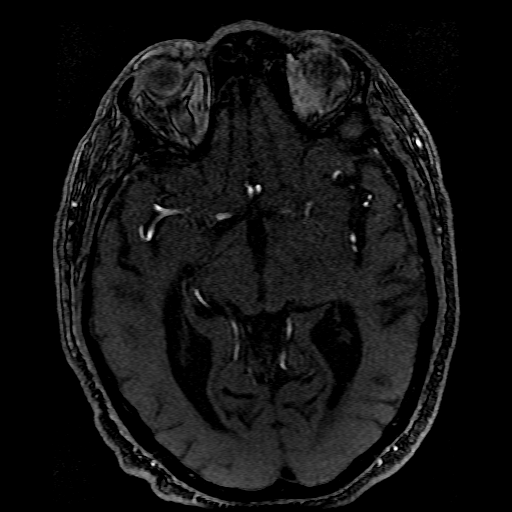
[im 118/143]
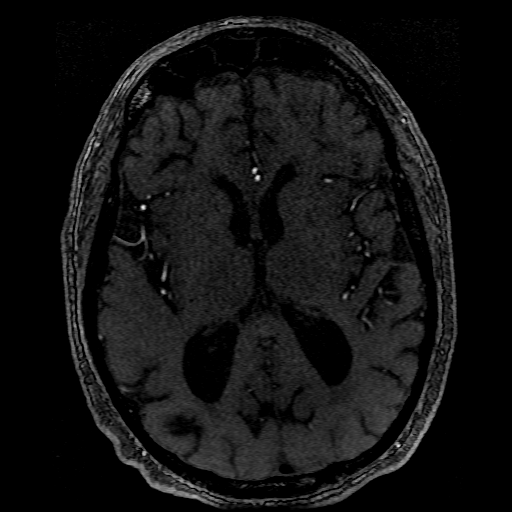
[im 121/143]
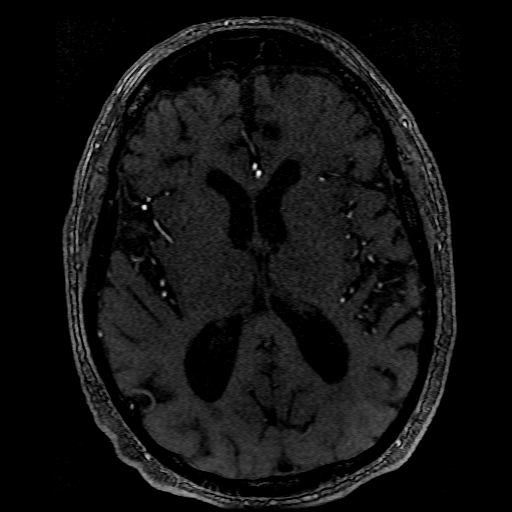
[im 136/143]
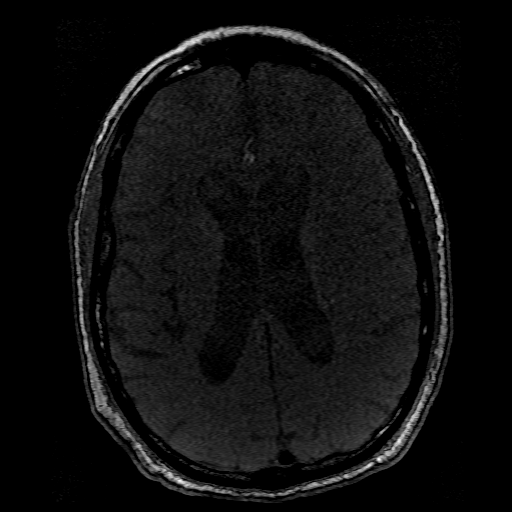

[19 of 48 positions shown; findings below may reference images not displayed]

FINDINGS: Dominant distal left vertebral artery. Moderately irregular but no
stenosis in the non dominant distal right vertebral artery. Both
PICA origins remain patent. Patent vertebrobasilar junction and no
basilar artery stenosis. SCA and PCA origins are normal. Diminutive
posterior communicating arteries. Bilateral PCA branches are within
normal limits.

Antegrade flow in both ICA siphons. No siphon stenosis. Ophthalmic
and posterior communicating artery origins are within normal limits.
Carotid termini are normal along with MCA and ACA origins.

Anterior communicating artery and visualized bilateral ACA branches
are within normal limits. Bilateral M1 segments, MCA bifurcations,
and visualized bilateral MCA branches are within normal limits.
IMPRESSION: Negative. Mild for age intracranial atherosclerosis, primarily
involving the non dominant distal right vertebral artery.

## 2017-11-22 IMAGING — DX DG ABD PORTABLE 1V
1 series · 1 of 1 positions shown · non-contrast
Comparison: 03/18/2016.

CLINICAL DATA: Nausea and vomiting with abdominal distention.

EXAM:
PORTABLE ABDOMEN - 1 VIEW

[abdomen kub]
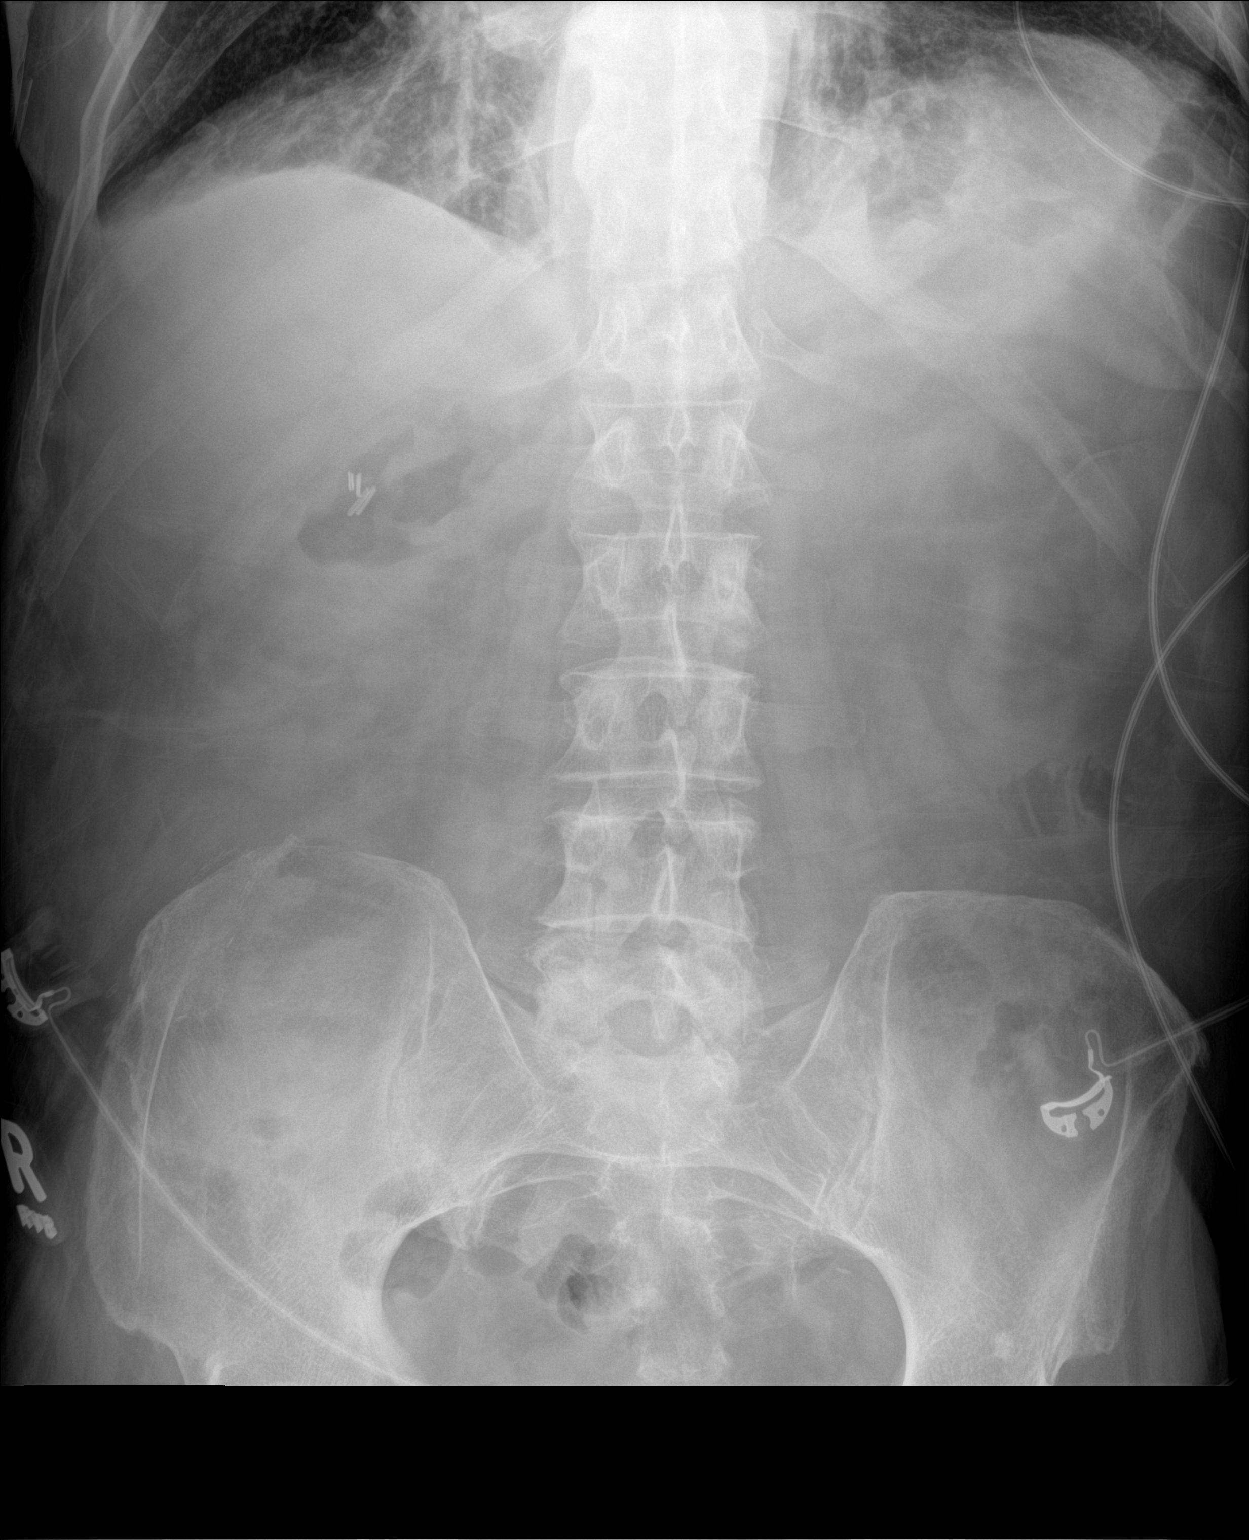

[1 of 1 positions shown; findings below may reference images not displayed]

FINDINGS: Gaseous distention of the colon is improved. Cholecystectomy clips
RIGHT upper quadrant. Within limits for assessment on supine
portable abdomen, no visible obstruction or free air.
IMPRESSION: Negative.
